# Patient Record
Sex: Male | Born: 1935 | Race: White | Hispanic: No | Smoking: Former smoker
Health system: Southern US, Community
[De-identification: ages and names within clinical notes are randomized; demographics above are authoritative.]

## PROBLEM LIST (undated history)

## (undated) DIAGNOSIS — C801 Malignant (primary) neoplasm, unspecified: Secondary | ICD-10-CM

## (undated) DIAGNOSIS — K219 Gastro-esophageal reflux disease without esophagitis: Secondary | ICD-10-CM

## (undated) DIAGNOSIS — Z960 Presence of urogenital implants: Secondary | ICD-10-CM

## (undated) DIAGNOSIS — E785 Hyperlipidemia, unspecified: Secondary | ICD-10-CM

## (undated) DIAGNOSIS — E46 Unspecified protein-calorie malnutrition: Secondary | ICD-10-CM

## (undated) DIAGNOSIS — R972 Elevated prostate specific antigen [PSA]: Secondary | ICD-10-CM

## (undated) DIAGNOSIS — C7800 Secondary malignant neoplasm of unspecified lung: Secondary | ICD-10-CM

## (undated) DIAGNOSIS — Z978 Presence of other specified devices: Secondary | ICD-10-CM

## (undated) DIAGNOSIS — E039 Hypothyroidism, unspecified: Secondary | ICD-10-CM

## (undated) DIAGNOSIS — C189 Malignant neoplasm of colon, unspecified: Secondary | ICD-10-CM

## (undated) DIAGNOSIS — S91009A Unspecified open wound, unspecified ankle, initial encounter: Secondary | ICD-10-CM

## (undated) DIAGNOSIS — J90 Pleural effusion, not elsewhere classified: Secondary | ICD-10-CM

## (undated) DIAGNOSIS — I10 Essential (primary) hypertension: Secondary | ICD-10-CM

## (undated) HISTORY — PX: VASECTOMY: SHX75

## (undated) HISTORY — PX: CHOLECYSTECTOMY: SHX55

## (undated) HISTORY — PX: THORACENTESIS: SHX235

## (undated) HISTORY — DX: Unspecified protein-calorie malnutrition: E46

## (undated) HISTORY — DX: Essential (primary) hypertension: I10

## (undated) HISTORY — DX: Hyperlipidemia, unspecified: E78.5

## (undated) HISTORY — DX: Hypothyroidism, unspecified: E03.9

## (undated) HISTORY — DX: Elevated prostate specific antigen (PSA): R97.20

## (undated) HISTORY — DX: Pleural effusion, not elsewhere classified: J90

---

## 2000-05-21 ENCOUNTER — Encounter: Payer: Self-pay | Admitting: Family Medicine

## 2000-05-21 LAB — CONVERTED CEMR LAB
PSA: 2 ng/mL
PSA: 2 ng/mL

## 2001-04-20 ENCOUNTER — Encounter: Payer: Self-pay | Admitting: Family Medicine

## 2001-04-20 LAB — CONVERTED CEMR LAB
PSA: 2.1 ng/mL
PSA: 2.1 ng/mL

## 2002-07-21 ENCOUNTER — Encounter: Payer: Self-pay | Admitting: Family Medicine

## 2002-07-21 LAB — CONVERTED CEMR LAB
PSA: 2.2 ng/mL
PSA: 2.2 ng/mL

## 2003-07-22 ENCOUNTER — Encounter: Payer: Self-pay | Admitting: Family Medicine

## 2003-07-22 LAB — CONVERTED CEMR LAB: PSA: 2.2 ng/mL

## 2004-07-21 ENCOUNTER — Encounter: Payer: Self-pay | Admitting: Family Medicine

## 2004-07-21 LAB — CONVERTED CEMR LAB
PSA: 2.3 ng/mL
PSA: 2.3 ng/mL

## 2005-02-10 ENCOUNTER — Ambulatory Visit: Payer: Self-pay | Admitting: Family Medicine

## 2005-02-12 ENCOUNTER — Ambulatory Visit: Payer: Self-pay | Admitting: Family Medicine

## 2005-02-25 ENCOUNTER — Ambulatory Visit: Payer: Self-pay | Admitting: Family Medicine

## 2005-07-21 ENCOUNTER — Encounter: Payer: Self-pay | Admitting: Family Medicine

## 2005-07-21 LAB — CONVERTED CEMR LAB
Hgb A1c MFr Bld: 5.6 %
PSA: 2.94 ng/mL
PSA: 2.94 ng/mL

## 2005-08-10 ENCOUNTER — Ambulatory Visit: Payer: Self-pay | Admitting: Family Medicine

## 2005-08-12 ENCOUNTER — Ambulatory Visit: Payer: Self-pay | Admitting: Family Medicine

## 2005-10-16 ENCOUNTER — Ambulatory Visit: Payer: Self-pay | Admitting: Internal Medicine

## 2006-01-21 ENCOUNTER — Encounter: Payer: Self-pay | Admitting: Family Medicine

## 2006-01-21 LAB — CONVERTED CEMR LAB: PSA: 3.01 ng/mL

## 2006-02-09 ENCOUNTER — Ambulatory Visit: Payer: Self-pay | Admitting: Family Medicine

## 2006-02-12 ENCOUNTER — Ambulatory Visit: Payer: Self-pay | Admitting: Family Medicine

## 2006-02-18 ENCOUNTER — Encounter: Payer: Self-pay | Admitting: Family Medicine

## 2006-02-18 LAB — CONVERTED CEMR LAB
PSA: 3.15 ng/mL
PSA: 3.15 ng/mL

## 2006-03-12 ENCOUNTER — Ambulatory Visit: Payer: Self-pay | Admitting: Family Medicine

## 2006-08-21 ENCOUNTER — Encounter: Payer: Self-pay | Admitting: Family Medicine

## 2006-09-07 ENCOUNTER — Ambulatory Visit: Payer: Self-pay | Admitting: Family Medicine

## 2006-09-16 ENCOUNTER — Ambulatory Visit: Payer: Self-pay | Admitting: Family Medicine

## 2006-09-29 ENCOUNTER — Ambulatory Visit: Payer: Self-pay | Admitting: Family Medicine

## 2006-10-22 ENCOUNTER — Ambulatory Visit: Payer: Self-pay | Admitting: Family Medicine

## 2007-04-29 ENCOUNTER — Encounter: Payer: Self-pay | Admitting: Family Medicine

## 2007-04-29 DIAGNOSIS — E785 Hyperlipidemia, unspecified: Secondary | ICD-10-CM | POA: Insufficient documentation

## 2007-04-29 DIAGNOSIS — R972 Elevated prostate specific antigen [PSA]: Secondary | ICD-10-CM

## 2007-04-29 DIAGNOSIS — I10 Essential (primary) hypertension: Secondary | ICD-10-CM

## 2007-04-29 DIAGNOSIS — E039 Hypothyroidism, unspecified: Secondary | ICD-10-CM

## 2007-05-01 DIAGNOSIS — R7989 Other specified abnormal findings of blood chemistry: Secondary | ICD-10-CM | POA: Insufficient documentation

## 2007-05-06 ENCOUNTER — Ambulatory Visit: Payer: Self-pay | Admitting: Family Medicine

## 2007-09-22 ENCOUNTER — Ambulatory Visit: Payer: Self-pay | Admitting: Family Medicine

## 2007-09-23 ENCOUNTER — Telehealth: Payer: Self-pay | Admitting: Family Medicine

## 2007-11-01 ENCOUNTER — Ambulatory Visit: Payer: Self-pay | Admitting: Family Medicine

## 2007-11-01 DIAGNOSIS — E749 Disorder of carbohydrate metabolism, unspecified: Secondary | ICD-10-CM | POA: Insufficient documentation

## 2007-11-01 LAB — CONVERTED CEMR LAB
ALT: 38 units/L (ref 0–53)
Albumin: 3.6 g/dL (ref 3.5–5.2)
Alkaline Phosphatase: 38 units/L — ABNORMAL LOW (ref 39–117)
BUN: 17 mg/dL (ref 6–23)
CO2: 29 meq/L (ref 19–32)
Calcium: 9.9 mg/dL (ref 8.4–10.5)
Direct LDL: 69.5 mg/dL
Free T4: 1.1 ng/dL (ref 0.6–1.6)
GFR calc Af Amer: 70 mL/min
HDL: 27.6 mg/dL — ABNORMAL LOW (ref >39.0)
Microalb Creat Ratio: 4.7 mg/g (ref 0.0–30.0)
Microalb, Ur: 0.9 mg/dL (ref 0.0–1.9)
PSA: 3.13 ng/mL (ref 0.10–4.00)
Potassium: 4.1 meq/L (ref 3.5–5.1)
TSH: 4.52 microintl units/mL (ref 0.35–5.50)
Total Protein: 7.3 g/dL (ref 6.0–8.3)
Triglycerides: 280 mg/dL (ref 0–149)
VLDL: 56 mg/dL — ABNORMAL HIGH (ref 0–40)

## 2007-11-08 ENCOUNTER — Ambulatory Visit: Payer: Self-pay | Admitting: Family Medicine

## 2007-11-25 ENCOUNTER — Ambulatory Visit: Payer: Self-pay | Admitting: Family Medicine

## 2007-11-28 ENCOUNTER — Encounter (INDEPENDENT_AMBULATORY_CARE_PROVIDER_SITE_OTHER): Payer: Self-pay | Admitting: *Deleted

## 2008-02-22 ENCOUNTER — Encounter: Payer: Self-pay | Admitting: Family Medicine

## 2008-06-26 ENCOUNTER — Ambulatory Visit: Payer: Self-pay | Admitting: Family Medicine

## 2008-09-19 ENCOUNTER — Ambulatory Visit: Payer: Self-pay | Admitting: Family Medicine

## 2008-11-22 ENCOUNTER — Encounter (INDEPENDENT_AMBULATORY_CARE_PROVIDER_SITE_OTHER): Payer: Self-pay | Admitting: *Deleted

## 2008-11-27 ENCOUNTER — Ambulatory Visit: Payer: Self-pay | Admitting: Family Medicine

## 2008-11-27 LAB — CONVERTED CEMR LAB
ALT: 42 units/L (ref 0–53)
Alkaline Phosphatase: 37 units/L — ABNORMAL LOW (ref 39–117)
Bilirubin, Direct: 0.1 mg/dL (ref 0.0–0.3)
CO2: 29 meq/L (ref 19–32)
Calcium: 9.4 mg/dL (ref 8.4–10.5)
Chloride: 103 meq/L (ref 96–112)
Glucose, Bld: 114 mg/dL — ABNORMAL HIGH (ref 70–99)
Microalb, Ur: 1.4 mg/dL (ref 0.0–1.9)
PSA: 3.41 ng/mL (ref 0.10–4.00)
Potassium: 4.2 meq/L (ref 3.5–5.1)
Sodium: 139 meq/L (ref 135–145)
TSH: 3.82 microintl units/mL (ref 0.35–5.50)
Total Bilirubin: 0.9 mg/dL (ref 0.3–1.2)
Total CHOL/HDL Ratio: 4.4
Total Protein: 7.6 g/dL (ref 6.0–8.3)

## 2008-11-29 ENCOUNTER — Ambulatory Visit: Payer: Self-pay | Admitting: Family Medicine

## 2008-12-28 ENCOUNTER — Ambulatory Visit: Payer: Self-pay | Admitting: Family Medicine

## 2008-12-28 LAB — CONVERTED CEMR LAB
OCCULT 1: NEGATIVE
OCCULT 3: NEGATIVE

## 2008-12-31 ENCOUNTER — Encounter (INDEPENDENT_AMBULATORY_CARE_PROVIDER_SITE_OTHER): Payer: Self-pay | Admitting: *Deleted

## 2009-04-04 ENCOUNTER — Telehealth: Payer: Self-pay | Admitting: Family Medicine

## 2009-06-20 ENCOUNTER — Ambulatory Visit: Payer: Self-pay | Admitting: Family Medicine

## 2009-06-21 ENCOUNTER — Encounter (INDEPENDENT_AMBULATORY_CARE_PROVIDER_SITE_OTHER): Payer: Self-pay | Admitting: *Deleted

## 2009-09-10 ENCOUNTER — Encounter: Payer: Self-pay | Admitting: Family Medicine

## 2009-09-13 ENCOUNTER — Telehealth (INDEPENDENT_AMBULATORY_CARE_PROVIDER_SITE_OTHER): Payer: Self-pay | Admitting: Internal Medicine

## 2009-09-18 ENCOUNTER — Telehealth: Payer: Self-pay | Admitting: Family Medicine

## 2009-12-24 ENCOUNTER — Ambulatory Visit: Payer: Self-pay | Admitting: Family Medicine

## 2009-12-24 LAB — CONVERTED CEMR LAB
ALT: 35 units/L (ref 0–53)
BUN: 18 mg/dL (ref 6–23)
CO2: 29 meq/L (ref 19–32)
Calcium: 9.9 mg/dL (ref 8.4–10.5)
Chloride: 101 meq/L (ref 96–112)
Cholesterol: 135 mg/dL (ref 0–200)
Creatinine, Ser: 1.4 mg/dL (ref 0.4–1.5)
HDL: 28.3 mg/dL — ABNORMAL LOW (ref >39.00)
TSH: 4.28 microintl units/mL (ref 0.35–5.50)
Total Bilirubin: 0.9 mg/dL (ref 0.3–1.2)
Total Protein: 7.7 g/dL (ref 6.0–8.3)
Triglycerides: 483 mg/dL — ABNORMAL HIGH (ref 0.0–149.0)

## 2009-12-26 ENCOUNTER — Ambulatory Visit: Payer: Self-pay | Admitting: Family Medicine

## 2010-01-28 ENCOUNTER — Ambulatory Visit: Payer: Self-pay | Admitting: Family Medicine

## 2010-01-28 ENCOUNTER — Encounter (INDEPENDENT_AMBULATORY_CARE_PROVIDER_SITE_OTHER): Payer: Self-pay | Admitting: *Deleted

## 2010-01-28 LAB — FECAL OCCULT BLOOD, GUAIAC: Fecal Occult Blood: NEGATIVE

## 2010-01-28 LAB — CONVERTED CEMR LAB
OCCULT 2: NEGATIVE
OCCULT 3: NEGATIVE

## 2010-02-21 ENCOUNTER — Ambulatory Visit: Payer: Self-pay | Admitting: Family Medicine

## 2010-02-22 LAB — CONVERTED CEMR LAB
PSA, Free Pct: 36 (ref >25)
PSA, Free: 1.2 ng/mL
PSA: 3.35 ng/mL (ref 0.10–4.00)

## 2010-02-26 ENCOUNTER — Ambulatory Visit: Payer: Self-pay | Admitting: Family Medicine

## 2010-04-22 ENCOUNTER — Telehealth: Payer: Self-pay | Admitting: Family Medicine

## 2010-05-21 ENCOUNTER — Ambulatory Visit: Payer: Self-pay | Admitting: Family Medicine

## 2010-06-06 ENCOUNTER — Telehealth: Payer: Self-pay | Admitting: Family Medicine

## 2010-06-17 ENCOUNTER — Encounter: Payer: Self-pay | Admitting: Family Medicine

## 2010-06-18 ENCOUNTER — Encounter: Payer: Self-pay | Admitting: Family Medicine

## 2010-06-24 ENCOUNTER — Ambulatory Visit: Payer: Self-pay | Admitting: Family Medicine

## 2010-07-29 ENCOUNTER — Encounter (INDEPENDENT_AMBULATORY_CARE_PROVIDER_SITE_OTHER): Payer: Self-pay | Admitting: *Deleted

## 2010-10-09 ENCOUNTER — Encounter: Payer: Self-pay | Admitting: Family Medicine

## 2011-01-02 ENCOUNTER — Telehealth (INDEPENDENT_AMBULATORY_CARE_PROVIDER_SITE_OTHER): Payer: Self-pay | Admitting: *Deleted

## 2011-01-05 ENCOUNTER — Other Ambulatory Visit: Payer: Self-pay | Admitting: Family Medicine

## 2011-01-05 ENCOUNTER — Ambulatory Visit
Admission: RE | Admit: 2011-01-05 | Discharge: 2011-01-05 | Payer: Self-pay | Source: Home / Self Care | Attending: Family Medicine | Admitting: Family Medicine

## 2011-01-05 LAB — TSH: TSH: 5.51 u[IU]/mL — ABNORMAL HIGH (ref 0.35–5.50)

## 2011-01-05 LAB — LIPID PANEL
Cholesterol: 135 mg/dL (ref 0–200)
HDL: 28.8 mg/dL — ABNORMAL LOW (ref >39.00)
Total CHOL/HDL Ratio: 5
Triglycerides: 245 mg/dL — ABNORMAL HIGH (ref 0.0–149.0)
VLDL: 49 mg/dL — ABNORMAL HIGH (ref 0.0–40.0)

## 2011-01-05 LAB — BASIC METABOLIC PANEL
BUN: 21 mg/dL (ref 6–23)
CO2: 27 mEq/L (ref 19–32)
Calcium: 9.4 mg/dL (ref 8.4–10.5)
Chloride: 100 mEq/L (ref 96–112)
Creatinine, Ser: 1.4 mg/dL (ref 0.4–1.5)
GFR: 53.61 mL/min — ABNORMAL LOW (ref >60.00)
Glucose, Bld: 99 mg/dL (ref 70–99)
Potassium: 3.8 mEq/L (ref 3.5–5.1)
Sodium: 137 mEq/L (ref 135–145)

## 2011-01-05 LAB — LDL CHOLESTEROL, DIRECT: Direct LDL: 63.4 mg/dL

## 2011-01-05 LAB — PSA: PSA: 3.87 ng/mL (ref 0.10–4.00)

## 2011-01-05 LAB — HEPATIC FUNCTION PANEL
ALT: 19 U/L (ref 0–53)
AST: 18 U/L (ref 0–37)
Albumin: 3.6 g/dL (ref 3.5–5.2)
Alkaline Phosphatase: 40 U/L (ref 39–117)
Bilirubin, Direct: 0.1 mg/dL (ref 0.0–0.3)
Total Bilirubin: 1.1 mg/dL (ref 0.3–1.2)
Total Protein: 7 g/dL (ref 6.0–8.3)

## 2011-01-12 ENCOUNTER — Ambulatory Visit
Admission: RE | Admit: 2011-01-12 | Discharge: 2011-01-12 | Payer: Self-pay | Source: Home / Self Care | Attending: Family Medicine | Admitting: Family Medicine

## 2011-01-12 ENCOUNTER — Encounter: Payer: Self-pay | Admitting: Family Medicine

## 2011-01-20 ENCOUNTER — Other Ambulatory Visit: Payer: Self-pay | Admitting: Family Medicine

## 2011-01-20 ENCOUNTER — Ambulatory Visit
Admission: RE | Admit: 2011-01-20 | Discharge: 2011-01-20 | Payer: Self-pay | Source: Home / Self Care | Attending: Family Medicine | Admitting: Family Medicine

## 2011-01-20 LAB — FECAL OCCULT BLOOD, IMMUNOCHEMICAL: Fecal Occult Bld: NEGATIVE

## 2011-01-20 NOTE — Progress Notes (Signed)
Summary: needs to change from maxzide  Phone Note Call from Patient Call back at Home Phone [phone]   Caller: Patient Summary of Call: Pt is unable to get maxzide through [facility].  He is asking to be changed to something else.  Letter is on your shelf. Initial call taken by: Lowella Petties CMA,  June 06, 2010 11:52 AM  Follow-up for Phone Call        Go to plain HCTZ 25mg  but get BP checked in 3 weeks. Follow-up by: Shaune Leeks MD,  June 09, 2010 7:30 AM  Additional Follow-up for Phone Call Additional follow up Details #1::        Pt to pick up script with instructions. Additional Follow-up by: Lowella Petties CMA,  June 09, 2010 10:12 AM    New/Updated Medications: HYDROCHLOROTHIAZIDE 25 MG TABS (HYDROCHLOROTHIAZIDE) one tab by mouth in AM Prescriptions: HYDROCHLOROTHIAZIDE 25 MG TABS (HYDROCHLOROTHIAZIDE) one tab by mouth in AM  #90 x 3   Entered and Authorized by:   Shaune Leeks MD   Signed by:   Shaune Leeks MD on 06/09/2010   Method used:   Print then Give to Patient   RxID:   [id number]

## 2011-01-20 NOTE — Assessment & Plan Note (Signed)
Summary: RECHECK OF B/P PER DR. SCHALLER   Vital Signs:  Patient profile:   76 year old male Height:      67 inches Weight:      213.25 pounds BMI:     33.52 Temp:     97.9 degrees F oral Pulse rate:   88 / minute Pulse rhythm:   regular BP sitting:   102 / 60  (left arm) Cuff size:   large  Vitals Entered By: Delilah Shan CMA (AAMA) (June 24, 2010 9:11 AM) CC: Recheck BP   History of Present Illness: Hypertension:      Using medication without problems or lightheadedness: yes Chest pain with exertion: no Edema:no Short of breath:no Average home BPs: not checked recently.   Other issues: no problems with med change.  here for BP check.   Allergies: No Known Drug Allergies  Review of Systems       See HPI.  Otherwise noncontributory.    Physical Exam  General:  GEN: nad, alert and oriented HEENT: mucous membranes moist NECK: supple, no bruit CV: regular rate and rhythm  PULM: ctab, no inc wob ABD: soft, +bs EXT: no edema SKIN: no acute rash    Impression & Recommendations:  Problem # 1:  HYPERTENSION (ICD-401.9) Continue current meds.  No change.  BP controlled.  His updated medication list for this problem includes:    Cardura 8 Mg Tabs (Doxazosin mesylate) .[name]... 1 by mouth daily    Propranolol Hcl 40 Mg Tabs (Propranolol hcl) .[name]... 1 by mouth two times a day    Hydrochlorothiazide 25 Mg Tabs (Hydrochlorothiazide) ..... One tab by mouth in am    Verapamil Hcl Cr 240 Mg Cr-tabs (Verapamil hcl) ..... One tab by mouth daily  Complete Medication List: 1)  Cardura 8 Mg Tabs (Doxazosin mesylate) .[name].. 1 by mouth daily 2)  Vitamin C Cr 500 Mg Cpcr (Ascorbic acid) .... As needed winter 3)  Propranolol Hcl 40 Mg Tabs (Propranolol hcl) .[name].. 1 by mouth two times a day 4)  Lipitor 10 Mg Tabs (Atorvastatin calcium) .[name].. 1 by mouth at bedtime 5)  Levoxyl 50 Mcg Tabs (Levothyroxine sodium) .[name].. 1 by mouth daily 6)  Hydrochlorothiazide 25 Mg Tabs (Hydrochlorothiazide)  .... One tab by mouth in am 7)  Fish Oil 1000 Mg Caps (Omega-3 fatty acids) .[name].. 1 by mouth two times a day 8)  B-100 Tabs (Vitamins-lipotropics) .[name].. 1 by mouth two times a day 9)  Verapamil Hcl Cr 240 Mg Cr-tabs (Verapamil hcl) .... One tab by mouth daily  Patient Instructions: 1)  Please schedule a follow-up appointment for October for a physical.  Keep taking your meds as you have been doing.  2)  Sunnie Nielsen, MD    Orders Added: 1)  Est. Patient Level III [phone]   Current Allergies (reviewed today): No known allergies

## 2011-01-20 NOTE — Progress Notes (Signed)
Summary: refill request for verapamil, propranolol  Phone Note Refill Request Message from:  Fax from Pharmacy  Refills Requested: Medication #1:  VERELAN 240 MG CP24 1 by mouth daily  Medication #2:  PROPRANOLOL HCL 40 MG TABS 1 by mouth two times a day Forms from [facility] is on  your shelf.  Initial call taken by: Lowella Petties CMA,  Apr 22, 2010 8:26 AM  Follow-up for Phone Call        Forms and scripts faxed. Follow-up by: Lowella Petties CMA,  Apr 22, 2010 8:59 AM    Prescriptions: VERELAN 240 MG CP24 (VERAPAMIL HCL) 1 by mouth daily  #90 x 3   Entered and Authorized by:   Shaune Leeks MD   Signed by:   Shaune Leeks MD on 04/22/2010   Method used:   Printed then faxed to ...         RxID:   [id number] PROPRANOLOL HCL 40 MG TABS (PROPRANOLOL HCL) 1 by mouth two times a day  #90 x 3   Entered and Authorized by:   Shaune Leeks MD   Signed by:   Shaune Leeks MD on 04/22/2010   Method used:   Printed then faxed to ...         RxID:   [id number]

## 2011-01-20 NOTE — Assessment & Plan Note (Signed)
Summary: cpx /rbh   Vital Signs:  Patient profile:   76 year old male Weight:      210 pounds Temp:     97.8 degrees F oral Pulse rate:   60 / minute Pulse rhythm:   regular BP sitting:   102 / 64  (left arm) Cuff size:   large  Vitals Entered By: Sydell Axon LPN 01/07/2010 8:20 AM) CC: 30 Minute checkup, hemoccult cards given to patient   History of Present Illness: Pt here for followup, having no complaints, had good Holiday Season. He feels well.  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 1 By Mouth Daily 2)  Vitamin C Cr 500 Mg Cpcr (Ascorbic Acid) .... As Needed Winter 3)  Propranolol Hcl 40 Mg Tabs (Propranolol Hcl) .[name].. 1 By Mouth Two Times A Day 4)  Lipitor 10 Mg Tabs (Atorvastatin Calcium) .[name].. 1 By Mouth At Bedtime 5)  Levoxyl 50 Mcg Tabs (Levothyroxine Sodium) .[name].. 1 By Mouth Daily 6)  Verelan 240 Mg Cp24 (Verapamil Hcl) .[name].. 1 By Mouth Daily 7)  Triamterene-Hctz 37.5-25 Mg Tabs (Triamterene-Hctz) .[name].. 1 By Mouth Daily 8)  Fish Oil 1000 Mg Caps (Omega-3 Fatty Acids) .[name].. 1 By Mouth Two Times A Day 9)  B-100  Tabs (Vitamins-Lipotropics) .[name].. 1 By Mouth Two Times A Day  Allergies: No  Known Drug Allergies  Past History:  Past Surgical History: Last updated: 04/29/2007 Choleycystectomy  Early 90s  Family History: Last updated: 01-07-2010 Father: died 6 ? MI Mother: died 28 HTN, high chol, [place] (In rest home) Brother A 40 Fayrene Fearing) Sister A 46  Breast Mass Sister A 55   (+) CAD (?MI father) HBP:  (+) Mother DM:  (+) cousin (-) stroke  Social History: Last updated: 01/07/10 Marital Status: Divorced, lives alone Children:  Occupation: Retired Camera operator Retired totally from part-time [employer] Former Smoker, quit 1987 2 PPD x 30 - 60 PYH Alcohol use-yes, rarely Drug use-no  Risk Factors: Alcohol Use: <1 (01-07-2010) Caffeine Use: 0 (2010-01-07) Exercise: yes (2010/01/07)  Risk Factors: Smoking Status: quit (01/07/10) Passive Smoke Exposure: no (2010/01/07)  Family History: Father: died 19 ? MI Mother: died 10 HTN, high chol, [place] (In rest home) Brother A 5 Fayrene Fearing) Sister A 77  Breast Mass Sister A 7   (+) CAD (?MI father) HBP:  (+) Mother DM:  (+) cousin (-) stroke  Social History: Marital Status: Divorced, lives alone Children:  Occupation: Retired Camera operator Retired totally from part-time [employer] Former Smoker, quit 1987 2 PPD x 30 - 60 PYH Alcohol use-yes, rarely Drug use-no Caffeine use/day:  0  Review of Systems General:  Denies chills, fatigue, fever, sweats, weakness, and weight loss. Eyes:  Denies blurring, eye irritation, eye pain, and itching. ENT:  Complains of decreased hearing; denies ear discharge, earache, and ringing in ears; just got hearing aids. CV:  Denies chest pain or discomfort, fainting, fatigue, palpitations, and shortness of breath with exertion. Resp:  Denies cough, shortness of breath, and wheezing. GI:  Denies abdominal pain, bloody stools, change in bowel habits, constipation, dark tarry stools, diarrhea, indigestion, loss of appetite, nausea, vomiting, vomiting blood, and  yellowish skin color. GU:  Denies discharge, dysuria, incontinence, nocturia, and urinary frequency. MS:  Denies joint pain, joint swelling, low back pain, muscle aches, cramps, and stiffness. Derm:  Complains of dryness; denies itching and rash. Neuro:  Denies numbness, poor balance, tingling, and tremors.  Physical Exam  General:  Well-developed,well-nourished,in no acute distress; alert,appropriate and cooperative throughout examination, mildly obese. Head:  Normocephalic and atraumatic without obvious abnormalities. No apparent alopecia or balding. Sinuses NT. Eyes:  Conjunctiva clear bilaterally.  Ears:  External ear exam shows no significant lesions or deformities.  Otoscopic examination reveals clear canals, tympanic membranes are intact bilaterally without bulging, retraction, inflammation or discharge. Hearing is grossly normal bilaterally. Nose:  External nasal examination shows no deformity or inflammation. Nasal mucosa are pink and moist without lesions or exudates. Mouth:  Oral mucosa and oropharynx without lesions or exudates.  Teeth in good repair. Neck:  No deformities, masses, or tenderness noted. Chest Wall:  No deformities, masses, tenderness or gynecomastia noted. Breasts:  No masses or gynecomastia noted Lungs:  Normal respiratory effort, chest expands symmetrically. Lungs are clear to auscultation, no crackles or wheezes. Heart:  Normal rate and regular rhythm. S1 and S2 normal without gallop, murmur, click, rub or other extra sounds. Abdomen:  Bowel sounds positive,abdomen soft and non-tender without masses, organomegaly or hernias noted. Rectal:  No external abnormalities noted. Normal sphincter tone. No rectal masses or tenderness. G neg. Genitalia:  Testes bilaterally descended without nodularity, tenderness or masses. No scrotal masses or lesions. No penis lesions or urethral discharge. Prostate:  Prostate gland firm and smooth, no enlargement, nodularity,  tenderness, mass, asymmetry or induration. 30gms. Msk:  No deformity or scoliosis noted of thoracic or lumbar spine.   Pulses:  R and L carotid,radial,femoral,dorsalis pedis and posterior tibial pulses are full and equal bilaterally Extremities:  No clubbing, cyanosis, edema, or deformity noted with normal full range of motion of all joints.   Neurologic:  No cranial nerve deficits noted. Station and gait are normal. Plantar reflexes are down-going bilaterally. DTRs are symmetrical throughout. Sensory, motor and coordinative functions appear intact. Skin:  Intact without suspicious lesions or rashes Cervical Nodes:  No lymphadenopathy noted Inguinal Nodes:  No significant adenopathy Psych:  Cognition and judgment appear intact. Alert and cooperative with normal attention span and concentration. No apparent delusions, illusions, hallucinations   Impression & Recommendations:  Problem # 1:  SPECIAL SCREENING MALIGNANT NEOPLASM OF PROSTATE (ICD-V76.44) Assessment Deteriorated Continues to slowly increase, will recheck next time with Free PSA...two months. Exam is smooth and symmetric, slighltly enlarged, no focal areas of concern.  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 Discussed getting back to regular exercise and watching diet.  Problem # 3:  HYPOTHYROIDISM (ICD-244.9) Assessment: Unchanged Euthyroid on current dose. Cont. His updated medication list for this problem includes:    Levoxyl 50 Mcg Tabs (Levothyroxine sodium) .[name]... 1 by mouth daily  Labs Reviewed: TSH: 4.28 (12/24/2009)    HgBA1c: 5.6 (07/21/2005) Chol: 135 (12/24/2009)   HDL: 28.30 (12/24/2009)   LDL: 63 (11/27/2008)   TG: 483.0 (12/24/2009)  Problem # 4:  HYPERLIPIDEMIA (ICD-272.4) Assessment: Unchanged  LDL great.Marland KitchenMarland KitchenMarland KitchenTrigs too high so decrease Sweets and carbs....needs to do this for Glu anyway. KLnow it is diff but was encouraged. HDL low so  getting back to exercise would be paramount...discussed. He has done before. His updated medication list for this problem includes:    Lipitor 10 Mg Tabs (Atorvastatin calcium) .[name]... 1 by mouth at bedtime  Labs Reviewed: SGOT: 31 (12/24/2009)   SGPT: 35 (12/24/2009)   HDL:28.30 (12/24/2009), 29.7 (11/27/2008)  LDL:63 (11/27/2008), DEL (47/42/5956)  Chol:135 (12/24/2009), 130 (11/27/2008)  Trig:483.0 (12/24/2009), 189 (11/27/2008)  Problem # 5:  HYPERTENSION (ICD-401.9) Assessment: Unchanged Stable. His updated medication list for this problem includes:    Cardura 8 Mg Tabs (Doxazosin mesylate) .[name]... 1 by mouth daily    Propranolol Hcl 40 Mg Tabs (Propranolol hcl) .[name]... 1 by mouth two times a day    Verelan 240 Mg Cp24 (Verapamil hcl) .[name]... 1 by mouth daily    Triamterene-hctz 37.5-25 Mg Tabs (Triamterene-hctz) .[name]... 1 by mouth daily  BP today: 102/64 Prior BP: 120/80 (06/20/2009)  Labs Reviewed: K+: 3.8 (12/24/2009) Creat: : 1.4 (12/24/2009)   Chol: 135 (12/24/2009)   HDL: 28.30 (12/24/2009)   LDL: 63 (11/27/2008)   TG: 483.0 (12/24/2009)  Complete Medication List: 1)  Cardura 8 Mg Tabs (Doxazosin mesylate) .[name].. 1 by mouth daily 2)  Vitamin C Cr 500 Mg Cpcr (Ascorbic acid) .... As needed winter 3)  Propranolol Hcl 40 Mg Tabs (Propranolol hcl) .[name].. 1 by mouth two times a day 4)  Lipitor 10 Mg Tabs (Atorvastatin calcium) .[name].. 1 by mouth at bedtime 5)  Levoxyl 50 Mcg Tabs (Levothyroxine sodium) .[name].. 1 by mouth daily 6)  Verelan 240 Mg Cp24 (Verapamil hcl) .[name].. 1 by mouth daily 7)  Triamterene-hctz 37.5-25 Mg Tabs (Triamterene-hctz) .[name].. 1 by mouth daily 8)  Fish Oil 1000 Mg Caps (Omega-3 fatty acids) .[name].. 1 by mouth two times a day 9)  B-100 Tabs (Vitamins-lipotropics) .[name].. 1 by mouth two times a day  Patient Instructions: 1)  RTC 2 mos for F/U, psa  and free psa prior 790.93  Current Allergies (reviewed today): No known allergies

## 2011-01-20 NOTE — Letter (Signed)
Summary: [facility] for Back Support  [facility]   Imported By: Beau Fanny 06/18/2010 10:49:48  _____________________________________________________________________  External Attachment:    Type:   Image     Comment:   External Document

## 2011-01-20 NOTE — Miscellaneous (Signed)
Summary: Flu vaccine  Clinical Lists Changes  Observations: Added new observation of FLU VAX: Historical (10/01/2010 11:15)      Influenza Immunization History:    Influenza # 1:  Historical (10/01/2010) Received form from [facility] [address] [place], [place]

## 2011-01-20 NOTE — Assessment & Plan Note (Signed)
Summary: FOLLOW UP   Vital Signs:  Patient profile:   76 year old male Weight:      209 pounds Temp:     98.5 degrees F oral Pulse rate:   64 / minute Pulse rhythm:   regular BP sitting:   114 / 68  (left arm) Cuff size:   large  Vitals Entered By: Sydell Axon LPN (February 26, 1609 9:00 AM) CC: Follow-up after labs   History of Present Illness: Hunter Hardin is a 76 y/o male who presents today for a f/u PSA following prior elevated PSA level.  His labs showed a PSA of 3.35.  Patient is retired but stays busy doing house remodeling.  He is tolerating all of his medications well and has no other medical complaints today.  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 1 By Mouth Daily 2)  Vitamin C Cr 500 Mg Cpcr (Ascorbic Acid) .... As Needed Winter 3)  Propranolol Hcl 40 Mg Tabs (Propranolol Hcl) .[name].. 1 By Mouth Two Times A Day 4)  Lipitor 10 Mg Tabs (Atorvastatin Calcium) .[name].. 1 By Mouth At Bedtime 5)  Levoxyl 50 Mcg Tabs (Levothyroxine Sodium) .[name].. 1 By Mouth Daily 6)  Verelan 240 Mg Cp24 (Verapamil Hcl) .[name].. 1 By Mouth Daily 7)  Triamterene-Hctz 37.5-25 Mg Tabs (Triamterene-Hctz) .[name].. 1 By Mouth Daily 8)  Fish Oil 1000 Mg Caps (Omega-3 Fatty Acids) .[name].. 1 By Mouth Two Times A Day 9)  B-100  Tabs (Vitamins-Lipotropics) .[name].. 1 By Mouth Two Times A Day  Allergies: No Known Drug Allergies  Physical Exam  General:  Well-developed,well-nourished,in no acute distress; alert,appropriate and cooperative throughout examination Head:  Normocephalic and  atraumatic without obvious abnormalities. No apparent alopecia or balding. Sinuses NT. Eyes:  Conjunctiva clear bilaterally.  Ears:  External ear exam shows no significant lesions or deformities.  Otoscopic examination reveals clear canals, tympanic membranes are intact bilaterally without bulging, retraction, inflammation or discharge. Hearing is grossly normal bilaterally. Nose:  External nasal examination shows no deformity or inflammation. Nasal mucosa are pink and moist without lesions or exudates. Mouth:  Oral mucosa and oropharynx without lesions or exudates.  Teeth in good repair. Lungs:  Normal respiratory effort, chest expands symmetrically. Lungs are clear to auscultation, no crackles or wheezes. Heart:  Normal rate and regular rhythm. S1 and S2 normal without gallop, murmur, click, rub or other extra sounds.   Impression & Recommendations:  Problem # 1:  ELEVATED PROSTATE SPECIFIC ANTIGEN (ICD-790.93) Assessment Improved Has decreased. Will follow.   Complete Medication List: 1)  Cardura 8 Mg Tabs (Doxazosin mesylate) .[name].. 1 by mouth daily 2)  Vitamin C Cr 500 Mg Cpcr (Ascorbic acid) .... As needed winter 3)  Propranolol Hcl 40 Mg Tabs (Propranolol hcl) .[name].. 1 by mouth two times a day 4)  Lipitor 10 Mg Tabs (Atorvastatin calcium) .[name].. 1 by mouth at bedtime 5)  Levoxyl 50 Mcg Tabs (Levothyroxine sodium) .[name].. 1 by mouth daily 6)  Verelan 240 Mg Cp24 (Verapamil hcl) .[name].. 1 by mouth daily 7)  Triamterene-hctz 37.5-25 Mg Tabs (Triamterene-hctz) .[name].. 1 by mouth daily  8)  Fish Oil 1000 Mg Caps (Omega-3 fatty acids) .[name].. 1 by mouth two times a day 9)  B-100 Tabs (Vitamins-lipotropics) .[name].. 1 by mouth two times a day  Patient Instructions: 1)  RTC 12/2010 for Comp Exam. Labs prior.  Current Allergies (reviewed today): No known allergies

## 2011-01-20 NOTE — Letter (Signed)
Summary: [name] letter  [place] [facility]  [address] [place], [place] [address]   Phone: [phone]  Fax: [phone]       07/29/2010 MRN: [id number]  Hunter Hardin [address] [place], [place]  [address]  Dear Mr. [name],  [facility], and [facility] announce the retirement of Arta Silence, M.D., from full-time practice at the [facility] office effective June 19, 2010 and his plans of returning part-time.  It is important to Dr. Hetty Ely and to our practice that you understand that [facility] has seven physicians in our office for your health care needs.  We will continue to offer the same exceptional care that you have today.    Dr. Hetty Ely has spoken to many of you about his plans for retirement and returning part-time in the fall.   We will continue to work with you through the transition to schedule appointments for you in the office and meet the high standards that [facility] is committed to.   Again, it is with great pleasure that we share the news that Dr. Hetty Ely will return to [facility] at [facility] in October of 2011 with a reduced schedule.    If you have any questions, or would like to request an appointment with one of our physicians, please call us at [phone] and press the option for Scheduling an appointment.  We take pleasure in providing you with excellent patient care and look forward to seeing you at your next office visit.  Our [facility] Physicians are:  Hunter Hardin, M.D. Hunter Hardin, M.D. Hunter Hardin, M.D. Hunter Hardin, M.D. Hunter Hardin, M.D. Hunter Hardin, M.D. We proudly welcomed Hunter Hardin, M.D. and Hunter Hardin, M.D. to the practice in July/August 2011.  Sincerely,  [facility]

## 2011-01-20 NOTE — Medication Information (Signed)
Summary: Verapamil ER Approved  Verapamil ER Approved   Imported By: Maryln Gottron 09/19/2010 11:30:40  _____________________________________________________________________  External Attachment:    Type:   Image     Comment:   External Document

## 2011-01-20 NOTE — Letter (Signed)
Summary: Results Follow up Letter  [place] [facility]  [address] [place], [place] [address]   Phone: [phone]  Fax: [phone]    01/28/2010 MRN: [id number]  Hunter Hardin [address] [place], [place]  [address]  Dear Hunter Hardin,  The following are the results of your recent test(s):  Test         Result    Pap Smear:        Normal _____  Not Normal _____ Comments: ______________________________________________________ Cholesterol: LDL(Bad cholesterol):         Your goal is less than:         HDL (Good cholesterol):       Your goal is more than: Comments:  ______________________________________________________ Mammogram:        Normal _____  Not Normal _____ Comments:  ___________________________________________________________________ Hemoccult:        Normal __X___  Not normal _______ Comments:  Please repeat in one year.  _____________________________________________________________________ Other Tests:    We routinely do not discuss normal results over the telephone.  If you desire a copy of the results, or you have any questions about this information we can discuss them at your next office visit.   Sincerely,      Laurita Quint, MD

## 2011-01-20 NOTE — Assessment & Plan Note (Signed)
Summary: BACK PAIN,DISCUSS ORDER FOR BACK SUPPORT   Vital Signs:  Patient profile:   76 year old male Weight:      211.75 pounds BMI:     33.28 Temp:     97.9 degrees F oral Pulse rate:   68 / minute Pulse rhythm:   regular BP sitting:   120 / 62  (left arm) Cuff size:   large  Vitals Entered By: Sydell Axon LPN (May 21, 453 10:09 AM) CC: Back pain, needs a form completed for a back support, wants refills on meds   History of Present Illness: Pt here for aching across his beltline with prolonged weed eating. He had back problem in the [employer] and reqiured trmt. He really has not had a significant problem since except for prolonged bending,,,,weed eating being a significant stressor due to position and vibration. He also needs an alternative for his Verelan as it is not currently available.  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 1 By Mouth Daily 2)  Vitamin C Cr 500 Mg Cpcr (Ascorbic Acid) .... As Needed Winter 3)  Propranolol Hcl 40 Mg Tabs (Propranolol Hcl) .[name].. 1 By Mouth Two Times A Day 4)  Lipitor 10 Mg Tabs (Atorvastatin Calcium) .[name].. 1 By Mouth At Bedtime 5)  Levoxyl 50 Mcg Tabs (Levothyroxine Sodium) .[name].. 1 By Mouth Daily 6)  Verelan 240 Mg Cp24 (Verapamil Hcl) .[name].. 1 By Mouth Daily 7)  Triamterene-Hctz 37.5-25 Mg Tabs (Triamterene-Hctz) .[name].. 1 By Mouth Daily 8)  Fish Oil 1000 Mg Caps (Omega-3 Fatty Acids) .[name].. 1 By Mouth Two Times A Day 9)  B-100  Tabs (Vitamins-Lipotropics) .[name].. 1 By Mouth Two Times A Day  Allergies: No Known Drug  Allergies  Physical Exam  General:  Well-developed,well-nourished,in no acute distress; alert,appropriate and cooperative throughout examination Head:  Normocephalic and atraumatic without obvious abnormalities. No apparent alopecia or balding. Sinuses NT. Eyes:  Conjunctiva clear bilaterally.  Ears:  External ear exam shows no significant lesions or deformities.  Otoscopic examination reveals clear canals, tympanic membranes are intact bilaterally without bulging, retraction, inflammation or discharge. Hearing is grossly normal bilaterally. Nose:  External nasal examination shows no deformity or inflammation. Nasal mucosa are pink and moist without lesions or exudates. Mouth:  Oral mucosa and oropharynx without lesions or exudates.  Teeth in good repair. Lungs:  Normal respiratory effort, chest expands symmetrically. Lungs are clear to auscultation, no crackles or wheezes. Heart:  Normal rate and regular rhythm. S1 and S2 normal without gallop, murmur, click, rub or other extra sounds. Msk:  No deformity or scoliosis noted of thoracic or lumbar spine.  Low back reasonable ROM.   Impression & Recommendations:  Problem # 1:  BACK PAIN, LUMBAR (ICD-724.2) Assessment New Pian with prolonged weed eating or other prolonged provocative maneuvers....discussed my biasis of generally weakening the mmuscles with brace use. Suggest conservative measures we discussed.  Problem # 2:  HYPERTENSION (ICD-401.9) Assessment: Unchanged Stable. Change Verelan to Verapamil Cr. The following medications were removed from the medication list:    Verelan 240 Mg Cp24 (Verapamil hcl) .[name]... 1 by mouth  daily His updated medication list for this problem includes:    Cardura 8 Mg Tabs (Doxazosin mesylate) .[name]... 1 by mouth daily    Propranolol Hcl 40 Mg Tabs (Propranolol hcl) .[name]... 1 by mouth two times a day    Triamterene-hctz 37.5-25 Mg Tabs (Triamterene-hctz) .[name]... 1 by mouth daily    Verapamil Hcl Cr 240 Mg  Cr-tabs (Verapamil hcl) ..... One tab by mouth daily  BP today: 120/62 Prior BP: 114/68 (02/26/2010)  Labs Reviewed: K+: 3.8 (12/24/2009) Creat: : 1.4 (12/24/2009)   Chol: 135 (12/24/2009)   HDL: 28.30 (12/24/2009)   LDL: 63 (11/27/2008)   TG: 483.0 (12/24/2009)  Complete Medication List: 1)  Cardura 8 Mg Tabs (Doxazosin mesylate) .[name].. 1 by mouth daily 2)  Vitamin C Cr 500 Mg Cpcr (Ascorbic acid) .... As needed winter 3)  Propranolol Hcl 40 Mg Tabs (Propranolol hcl) .[name].. 1 by mouth two times a day 4)  Lipitor 10 Mg Tabs (Atorvastatin calcium) .[name].. 1 by mouth at bedtime 5)  Levoxyl 50 Mcg Tabs (Levothyroxine sodium) .[name].. 1 by mouth daily 6)  Triamterene-hctz 37.5-25 Mg Tabs (Triamterene-hctz) .[name].. 1 by mouth daily 7)  Fish Oil 1000 Mg Caps (Omega-3 fatty acids) .[name].. 1 by mouth two times a day 8)  B-100 Tabs (Vitamins-lipotropics) .[name].. 1 by mouth two times a day 9)  Verapamil Hcl Cr 240 Mg Cr-tabs (Verapamil hcl) .... One tab by mouth daily  Patient Instructions: 1)  RTC as discussed for BP recheck and Comp Exam. Prescriptions: VERAPAMIL HCL CR 240 MG CR-TABS (VERAPAMIL HCL) one tab by mouth daily  #90 x 3   Entered and Authorized by:   Shaune Leeks MD   Signed by:   Shaune Leeks MD on 05/21/2010   Method used:   Print then Give to Patient   RxID:   [id number] CARDURA 8 MG TABS (DOXAZOSIN MESYLATE) 1 by mouth daily  #90 x 3   Entered by:   Sydell Axon LPN   Authorized by:   Shaune Leeks MD   Signed by:   Sydell Axon LPN on 96/29/5284   Method used:   Print then Give to Patient   RxID:   [id number] TRIAMTERENE-HCTZ 37.5-25 MG TABS (TRIAMTERENE-HCTZ) 1 by mouth daily  #90 x 3   Entered by:   Sydell Axon LPN   Authorized by:   Shaune Leeks MD   Signed by:   Sydell Axon LPN on 40/34/7425   Method used:   Print then Give to Patient   RxID:   [id number]   Current Allergies (reviewed today): No known allergies

## 2011-01-22 ENCOUNTER — Encounter (INDEPENDENT_AMBULATORY_CARE_PROVIDER_SITE_OTHER): Payer: Self-pay | Admitting: *Deleted

## 2011-01-22 NOTE — Progress Notes (Signed)
----   Converted from flag ---- ---- 01/01/2011 11:06 PM, Crawford Givens MD wrote: 244.9 TSH 401.1 CMET/lipid 790.93 PSA  ---- 01/01/2011 10:48 AM, Liane Comber CMA (AAMA) wrote: Lab orders please! Good Morning! This pt is scheduled for cpx labs Monday, which labs to draw and dx codes to use? Thanks Tasha ------------------------------

## 2011-01-22 NOTE — Letter (Signed)
Summary: [facility]: Immunoassay Fecal Occult Blood (iFOB) Order Form  [place] [facility]  [address] [place], [place] [address]   Phone: [phone]  Fax: [phone]      [facility]: Immunoassay Fecal Occult Blood (iFOB) Order Form   January 12, 2011 MRN: [id number]   Hunter Hardin 08/18/1935   Physicican Name:_______duncan_________________  Diagnosis Code:_________v76.49_________________      Crawford Givens MD

## 2011-01-22 NOTE — Assessment & Plan Note (Signed)
Summary: CPX/JRR   Vital Signs:  Patient profile:   76 year old male Height:      67 inches Weight:      211.75 pounds BMI:     33.28 Temp:     97.8 degrees F oral Pulse rate:   72 / minute Pulse rhythm:   regular BP sitting:   130 / 68  (left arm) Cuff size:   large  Vitals Entered By: Delilah Shan CMA Josi Roediger Dull) (January 12, 2011 8:50 AM) CC: CPX  Vision Screening:Left eye with correction: 20 / 50 Right eye with correction: 20 / 50 Both eyes with correction: 20 / 50        Vision Entered By: Delilah Shan CMA Syanne Looney Dull) (January 12, 2011 8:51 AM)   History of Present Illness: I have personally reviewed the Medicare Annual Wellness questionnaire and have noted 1.   The patient's medical and social history 2.   Their use of alcohol, tobacco or illicit drugs 3.   Their current medications and supplements 4.   The patient's functional ability including ADL's, fall risks, home safety risks and hearing or visual             impairment. 5.   Diet and physical activities 6.   Evidence for depression or mood disorders  The patients weight, height, BMI and visual acuity have been recorded in the chart I have made referrals, counseling and provided education to the patient based review of the above and I have provided the pt with a written personalized care plan for preventive services.  Elevated Cholesterol: Using medications without problems:yes Muscle aches: no Other complaints:  Hypertension:      Using medication without problems or lightheadedness: yes Chest pain with exertion:no Edema:no Short of breath:no Other issues: labs reviwed with patient.   Hypothyroidism.  Needs to increase replacement by 1/2 tab on Sundays since his TSH slightly up.  Doing well o/w w/o symptoms of under or over replacement. No ant neck pain.   Feeling well o/w w/o complaints.   H/o BPH w/o new symptoms.  No FH of prostate CA.    Preventive Screening-Counseling &  Management  Alcohol-Tobacco     Smoking Status: quit > 6 months  Allergies: No Known Drug Allergies  Past History:  Past Surgical History: Last updated: 04/29/2007 Choleycystectomy  Early 90s  Past Medical History: Hypothyroidism HTN HLD H/o elevated PSA  Family History: Reviewed history from 12/26/2009 and no changes required. Father: died 70 ? MI Mother: died 2 HTN, high chol, MI (In rest home) Brother A  Sister A Breast Mass, treated Sister A    (+) CAD (?MI father) HBP:  (+) Mother DM:  (+) cousin (-) stroke  Social History: Reviewed history from 12/26/2009 and no changes required. Marital Status: Divorced, lives alone Children: 2 children Occupation: Retired Camera operator Retired totally from part-time [employer] Former Smoker, quit 1987 2 PPD x 30 - 60 PYH Alcohol use-no Drug use-no walking for exerciseSmoking Status:  quit > 6 months  Review of Systems       See HPI.  Otherwise negative.    Physical Exam  General:  GEN: nad, alert and oriented HEENT: mucous membranes moist NECK: supple w/o LA, no TMG CV: rrr. PULM: ctab, no inc wob ABD: soft, +bs EXT: no edema SKIN: n acute rash  Prostate:  Prostate gland firm and smooth, symmetric  enlargement, but no nodularity, tenderness, mass, asymmetry or induration.   Impression & Recommendations:  Problem #  1:  Preventive Health Care (ICD-V70.0) Td done, patient to check on zostavax coverage.  D/w patient ZO:[id number] vs IFOB, he is aware of risk/benefit with both.  Elects for IFOB.  D/w patient JX:BJYN and exercise/weight.  labs reviewed wiht patient.   Problem # 2:  ELEVATED PROSTATE SPECIFIC ANTIGEN (ICD-790.93) labs d/w patient.  He does have enlargement, nonbothersome for patient.  No change in meds. PSA <4 and no sig increase.    Problem # 3:  HYPOTHYROIDISM (ICD-244.9) Note change on med and follow up for labs.  His updated medication list for this problem includes:    Levoxyl 50  Mcg Tabs (Levothyroxine sodium) .[name]... 1 by mouth daily except for 1.5 tabs on sunday  Problem # 4:  HYPERTENSION (ICD-401.9) No change in meds.   His updated medication list for this problem includes:    Cardura 8 Mg Tabs (Doxazosin mesylate) .[name]... 1 by mouth daily    Propranolol Hcl 40 Mg Tabs (Propranolol hcl) .[name]... 1 by mouth two times a day    Hydrochlorothiazide 25 Mg Tabs (Hydrochlorothiazide) ..... One tab by mouth in am    Verapamil Hcl Cr 240 Mg Cr-tabs (Verapamil hcl) ..... One tab by mouth daily  Problem # 5:  HYPERLIPIDEMIA (ICD-272.4) lipids controlled.  His updated medication list for this problem includes:    Lipitor 10 Mg Tabs (Atorvastatin calcium) .[name]... 1 by mouth at bedtime  Complete Medication List: 1)  Cardura 8 Mg Tabs (Doxazosin mesylate) .[name].. 1 by mouth daily 2)  Vitamin C Cr 500 Mg Cpcr (Ascorbic acid) .... As needed winter 3)  Propranolol Hcl 40 Mg Tabs (Propranolol hcl) .[name].. 1 by mouth two times a day 4)  Lipitor 10 Mg Tabs (Atorvastatin calcium) .[name].. 1 by mouth at bedtime 5)  Levoxyl 50 Mcg Tabs (Levothyroxine sodium) .[name].. 1 by mouth daily except for 1.5 tabs on sunday 6)  Hydrochlorothiazide 25 Mg Tabs (Hydrochlorothiazide) .... One tab by mouth in am 7)  Fish Oil 1000 Mg Caps (Omega-3 fatty acids) .[name].. 1 by mouth two times a day 8)  B-100 Tabs (Vitamins-lipotropics) .[name].. 1 by mouth two times a day 9)  Verapamil Hcl Cr 240 Mg Cr-tabs (Verapamil hcl) .... One tab by mouth daily  Other Orders: Medicare -1st Annual Wellness Visit [phone]) TD Toxoids IM 7 YR + (21308) Admin 1st Vaccine (65784)  Patient Instructions: 1)  Check with your insurance to see if they will cover the shingles shot.   2)  Try to increase your exercise.   3)  I would increase your thyroid medicine to 1 everyday except for 1.5 on Sunday.  Recheck TSH in 6-8 weeks at lab visit. dx 244.9. 4)  Take care.   5)  Glad to see you today.  6)  Recheck next year.    Orders Added: 1)   Medicare -1st Annual Wellness Visit [G0438] 2)  Est. Patient Level IV [69629] 3)  TD Toxoids IM 7 YR + [90714] 4)  Admin 1st Vaccine [90471]   Immunizations Administered:  Tetanus Vaccine:    Vaccine Type: Td    Site: right deltoid    Mfr: Sanofi Pasteur    Dose: 0.5 ml    Route: IM    Given by: Delilah Shan CMA (AAMA)    Exp. Date: 04/08/2012    Lot #: [id number]    VIS given: 11/07/08 version given January 12, 2011.   Immunizations Administered:  Tetanus Vaccine:    Vaccine Type: Td    Site: right  deltoid    Mfr: Sanofi Pasteur    Dose: 0.5 ml    Route: IM    Given by: Delilah Shan CMA (AAMA)    Exp. Date: 04/08/2012    Lot #: [id number]    VIS given: 11/07/08 version given January 12, 2011.  Current Allergies (reviewed today): No known allergies

## 2011-01-22 NOTE — Letter (Signed)
Summary: Nature conservation officer [employer] Wellness Visit Questionnaire   [employer] Medicare Annual Wellness Visit Questionnaire   Imported By: Beau Fanny 01/14/2011 10:17:34  _____________________________________________________________________  External Attachment:    Type:   Image     Comment:   External Document

## 2011-01-28 NOTE — Letter (Signed)
Summary: Results Follow up Letter  [name] [facility]  [address] [place], [place] [address]   Phone: [phone]  Fax: [phone]    01/22/2011 MRN: [id number]    Hunter Hardin [address] [place], [place]  [address]    Dear Mr. BELT,  The following are the results of your recent test(s):  Test         Result    Pap Smear:        Normal _____  Not Normal _____ Comments: ______________________________________________________ Cholesterol: LDL(Bad cholesterol):         Your goal is less than:         HDL (Good cholesterol):       Your goal is more than: Comments:  ______________________________________________________ Mammogram:        Normal _____  Not Normal _____ Comments:  ___________________________________________________________________ Hemoccult:        Normal __X___  Not normal _______ Comments:  Yearly follow up is recommended.   _____________________________________________________________________ Other Tests:    We routinely do not discuss normal results over the telephone.  If you desire a copy of the results, or you have any questions about this information we can discuss them at your next office visit.   Sincerely,    Dwana Curd. Para March, M.D.  [facility]

## 2011-03-17 ENCOUNTER — Other Ambulatory Visit: Payer: Self-pay | Admitting: *Deleted

## 2011-03-17 MED ORDER — VERAPAMIL HCL ER 240 MG PO TBCR: 240.0000 mg | EXTENDED_RELEASE_TABLET | Freq: Every day | ORAL | Status: DC

## 2011-05-08 NOTE — Letter (Signed)
September 16, 2006     Dr. Diona Browner  [address]  [place], [place] [address]   RE:  Hunter Hardin, Hunter Hardin  MRN:  [id number]  /  DOB:  1935/10/10   Dear Alvis Lemmings,   This is to introduce Meredith Mody, a 76 year old white male whom I am  sending to you for a lesion on the left upper lip.  He is a healthy guy with  no real medical problems and no family history of skin cancer problems that  I am aware of.  He himself has not had any problems, but has a lesion on his  upper lip approximately 1 cm in circumference, slightly oval, that has a  rugal look to it with a mildly depressed central zone and is skin-colored.  He has a history of significant sun exposure in the past.  I just think it  would be reasonable to rule out any kind of basal cell or squamous cell.   PAST MEDICAL HISTORY:  1. Significant for hypertension.  2. Elevated cholesterol.  3. Elevated triglycerides.  4. Hypothyroidism.  5. Benign prostatic hypertrophy.  6. Elevated glucose.   MEDICATIONS:  1. Cardura 8 mg daily.  2. Inderal 40 mg b.i.d.  3. Lipitor 10 mg at night.  4. Levoxyl 50 mcg daily.  5. Verelan SR 240 mg daily.  6. Maxzide 37.5/25 mg, one daily.   ALLERGIES:  No known drug allergies.   He is as I said, very healthy.  I appreciate your seeing him.  I look  forward to your evaluation.    Sincerely,       ______________________________  Arta Silence, MD   RNS/MedQ  DD:  09/16/2006  DT:  09/18/2006  Job #:  [id number]

## 2011-05-25 ENCOUNTER — Other Ambulatory Visit: Payer: Self-pay | Admitting: *Deleted

## 2011-05-25 MED ORDER — PROPRANOLOL HCL 40 MG PO TABS: ORAL_TABLET | ORAL | Status: DC

## 2011-05-25 MED ORDER — DOXAZOSIN MESYLATE 8 MG PO TABS: 8.0000 mg | ORAL_TABLET | Freq: Every day | ORAL | Status: DC

## 2011-09-22 ENCOUNTER — Other Ambulatory Visit: Payer: Self-pay | Admitting: Family Medicine

## 2011-10-06 ENCOUNTER — Other Ambulatory Visit: Payer: Self-pay | Admitting: Family Medicine

## 2011-10-08 ENCOUNTER — Other Ambulatory Visit: Payer: Self-pay | Admitting: Family Medicine

## 2011-10-08 ENCOUNTER — Other Ambulatory Visit: Payer: Self-pay | Admitting: *Deleted

## 2011-10-08 DIAGNOSIS — E039 Hypothyroidism, unspecified: Secondary | ICD-10-CM

## 2011-10-08 DIAGNOSIS — R972 Elevated prostate specific antigen [PSA]: Secondary | ICD-10-CM

## 2011-10-08 DIAGNOSIS — E789 Disorder of lipoprotein metabolism, unspecified: Secondary | ICD-10-CM

## 2011-10-08 MED ORDER — ATORVASTATIN CALCIUM 10 MG PO TABS: 10.0000 mg | ORAL_TABLET | Freq: Every day | ORAL | Status: DC

## 2011-10-08 MED ORDER — HYDROCHLOROTHIAZIDE 25 MG PO TABS: 25.0000 mg | ORAL_TABLET | Freq: Every day | ORAL | Status: DC

## 2011-10-09 ENCOUNTER — Other Ambulatory Visit (INDEPENDENT_AMBULATORY_CARE_PROVIDER_SITE_OTHER): Payer: Medicare Other

## 2011-10-09 DIAGNOSIS — E039 Hypothyroidism, unspecified: Secondary | ICD-10-CM

## 2011-10-09 DIAGNOSIS — R972 Elevated prostate specific antigen [PSA]: Secondary | ICD-10-CM

## 2011-10-09 DIAGNOSIS — E789 Disorder of lipoprotein metabolism, unspecified: Secondary | ICD-10-CM

## 2011-10-09 LAB — LIPID PANEL
Cholesterol: 144 mg/dL (ref 0–200)
HDL: 32.6 mg/dL — ABNORMAL LOW (ref >39.00)
Total CHOL/HDL Ratio: 4
Triglycerides: 255 mg/dL — ABNORMAL HIGH (ref 0.0–149.0)
VLDL: 51 mg/dL — ABNORMAL HIGH (ref 0.0–40.0)

## 2011-10-09 LAB — COMPREHENSIVE METABOLIC PANEL
ALT: 22 U/L (ref 0–53)
CO2: 24 mEq/L (ref 19–32)
Calcium: 9.6 mg/dL (ref 8.4–10.5)
Chloride: 107 mEq/L (ref 96–112)
Creatinine, Ser: 1.5 mg/dL (ref 0.4–1.5)
GFR: 50.53 mL/min — ABNORMAL LOW (ref >60.00)
Glucose, Bld: 113 mg/dL — ABNORMAL HIGH (ref 70–99)
Sodium: 145 mEq/L (ref 135–145)
Total Bilirubin: 0.9 mg/dL (ref 0.3–1.2)
Total Protein: 7.6 g/dL (ref 6.0–8.3)

## 2011-10-09 LAB — TSH: TSH: 3.55 u[IU]/mL (ref 0.35–5.50)

## 2011-10-09 NOTE — Progress Notes (Signed)
Addended by: Alvina Chou on: 10/09/2011 10:18 AM   Modules accepted: Orders

## 2011-10-14 ENCOUNTER — Encounter: Payer: Self-pay | Admitting: Family Medicine

## 2011-10-15 ENCOUNTER — Encounter: Payer: Self-pay | Admitting: Family Medicine

## 2011-10-15 ENCOUNTER — Ambulatory Visit (INDEPENDENT_AMBULATORY_CARE_PROVIDER_SITE_OTHER): Payer: Medicare Other | Admitting: Family Medicine

## 2011-10-15 VITALS — BP 102/64 | HR 62 | Temp 97.7°F | Wt 207.1 lb

## 2011-10-15 DIAGNOSIS — E785 Hyperlipidemia, unspecified: Secondary | ICD-10-CM

## 2011-10-15 DIAGNOSIS — R972 Elevated prostate specific antigen [PSA]: Secondary | ICD-10-CM

## 2011-10-15 DIAGNOSIS — E039 Hypothyroidism, unspecified: Secondary | ICD-10-CM

## 2011-10-15 DIAGNOSIS — Z1211 Encounter for screening for malignant neoplasm of colon: Secondary | ICD-10-CM | POA: Insufficient documentation

## 2011-10-15 DIAGNOSIS — I10 Essential (primary) hypertension: Secondary | ICD-10-CM

## 2011-10-15 DIAGNOSIS — R7989 Other specified abnormal findings of blood chemistry: Secondary | ICD-10-CM

## 2011-10-15 NOTE — Assessment & Plan Note (Signed)
Continue current meds. Labs d/w pt.  

## 2011-10-15 NOTE — Assessment & Plan Note (Signed)
DRE w/o significant abnormality and modest BPH sx.  D/w pt.  We agreed to recheck PSA in 2 months.  If still up, then consider uro eval.  He agrees.  Neg FH prostate CA.

## 2011-10-15 NOTE — Patient Instructions (Addendum)
Check with your insurance to see if they will cover the shingles shot.  See if the recruiting office can give your a phone number to call.  Check with the lab about the stool cards.  Come back for nonfasting labs in 2 months.  We'll let you know about your PSA at that point.  Take care.  Keep walking and working in the yard.

## 2011-10-15 NOTE — Progress Notes (Signed)
Elevated Cholesterol: Using medications without problems: yes Muscle aches: no Diet compliance: "I'm trying." Exercise: some walking and yard work  H/o elevated PSA.  No dysuria.  Stream is variable.  Occ nocturia, depending on last PM intake.  No pelvic pain. No prev prostate intervention.    Colon cancer screening.  D/w patient ZO:XWRUEAV for colon cancer screening, including IFOB vs. colonoscopy.  Risks and benefits of both were discussed and patient voiced understanding.  Pt elects WUJ:WJXB.   Thyroid disease.  Compliant with meds.  No ant neck pain, no dysphagia.  No sudden weight changes.   Mild elevation in glucose.  Not a new problem.  We talked about diet and weight along with exercise.   Hypertension:    Using medication without problems or lightheadedness: yes Chest pain with exertion:no Edema:no Short of breath:no Average home BPs: "it wasn't high" on home check  Meds, vitals, and allergies reviewed.   PMH and SH reviewed  ROS: See HPI.  Otherwise negative.    GEN: nad, alert and oriented HEENT: mucous membranes moist NECK: supple w/o LA CV: rrr. PULM: ctab, no inc wob ABD: soft, +bs EXT: no edema SKIN: no acute rash Prostate gland firm and smooth, mild enlargement, but nonodularity, tenderness, mass, asymmetry or induration.

## 2011-10-15 NOTE — Assessment & Plan Note (Signed)
Continue current meds.  Labs d/w pt.  D/w pt about diet and weight.   

## 2011-10-15 NOTE — Assessment & Plan Note (Signed)
Neg FH, he elects for IFOB, see above.

## 2011-10-15 NOTE — Assessment & Plan Note (Signed)
Continue current meds.  Labs d/w pt.  D/w pt about diet and weight.

## 2011-10-15 NOTE — Assessment & Plan Note (Signed)
Mild, work on diet and weight.  He agrees.  No meds.

## 2011-10-22 ENCOUNTER — Other Ambulatory Visit: Payer: Self-pay | Admitting: Family Medicine

## 2011-10-28 ENCOUNTER — Encounter: Payer: Self-pay | Admitting: *Deleted

## 2011-10-28 DIAGNOSIS — Z1289 Encounter for screening for malignant neoplasm of other sites: Secondary | ICD-10-CM

## 2011-10-28 DIAGNOSIS — Z1211 Encounter for screening for malignant neoplasm of colon: Secondary | ICD-10-CM

## 2011-12-07 ENCOUNTER — Other Ambulatory Visit (INDEPENDENT_AMBULATORY_CARE_PROVIDER_SITE_OTHER): Payer: Medicare Other

## 2011-12-07 DIAGNOSIS — R972 Elevated prostate specific antigen [PSA]: Secondary | ICD-10-CM

## 2011-12-08 ENCOUNTER — Ambulatory Visit (INDEPENDENT_AMBULATORY_CARE_PROVIDER_SITE_OTHER): Payer: Medicare Other | Admitting: *Deleted

## 2011-12-08 ENCOUNTER — Other Ambulatory Visit: Payer: Self-pay | Admitting: Family Medicine

## 2011-12-08 DIAGNOSIS — R972 Elevated prostate specific antigen [PSA]: Secondary | ICD-10-CM

## 2011-12-08 DIAGNOSIS — Z23 Encounter for immunization: Secondary | ICD-10-CM

## 2011-12-08 LAB — PSA, TOTAL AND FREE: PSA: 4.75 ng/mL — ABNORMAL HIGH (ref <=4.00)

## 2011-12-16 ENCOUNTER — Other Ambulatory Visit: Payer: Self-pay | Admitting: Internal Medicine

## 2011-12-16 MED ORDER — PROPRANOLOL HCL 40 MG PO TABS: ORAL_TABLET | ORAL | Status: DC

## 2011-12-16 NOTE — Telephone Encounter (Signed)
Faxed refill request for Propranolol to pharmacy.

## 2011-12-27 ENCOUNTER — Encounter: Payer: Self-pay | Admitting: Family Medicine

## 2012-01-01 ENCOUNTER — Other Ambulatory Visit: Payer: Self-pay | Admitting: *Deleted

## 2012-01-01 MED ORDER — VERAPAMIL HCL ER 240 MG PO TBCR: 240.0000 mg | EXTENDED_RELEASE_TABLET | Freq: Every day | ORAL | Status: DC

## 2012-03-09 ENCOUNTER — Other Ambulatory Visit: Payer: Self-pay | Admitting: *Deleted

## 2012-03-09 MED ORDER — DOXAZOSIN MESYLATE 8 MG PO TABS: 8.0000 mg | ORAL_TABLET | Freq: Every day | ORAL | Status: DC

## 2012-04-20 ENCOUNTER — Other Ambulatory Visit: Payer: Self-pay

## 2012-04-20 MED ORDER — ATORVASTATIN CALCIUM 10 MG PO TABS: 10.0000 mg | ORAL_TABLET | Freq: Every day | ORAL | Status: DC

## 2012-04-20 MED ORDER — HYDROCHLOROTHIAZIDE 25 MG PO TABS: 25.0000 mg | ORAL_TABLET | Freq: Every day | ORAL | Status: DC

## 2012-04-20 NOTE — Telephone Encounter (Signed)
Pt request refill Lipitor 10 mg #90 x 3 and HCTZ 25 mg #90 x 3 to [facility].Pt notified by phone rxs sent.

## 2012-09-13 ENCOUNTER — Other Ambulatory Visit: Payer: Self-pay | Admitting: Family Medicine

## 2012-09-13 NOTE — Telephone Encounter (Signed)
Sent, please set up CPE and labs ahead of time.  Thanks.

## 2012-09-13 NOTE — Telephone Encounter (Signed)
Patient advised.  Will call within the next week or so to schedule appt.

## 2012-09-13 NOTE — Telephone Encounter (Signed)
Electronic refill request.  Looks like patient may need OV.  Please advise.

## 2012-09-20 DIAGNOSIS — H40009 Preglaucoma, unspecified, unspecified eye: Secondary | ICD-10-CM | POA: Diagnosis not present

## 2012-09-20 DIAGNOSIS — I10 Essential (primary) hypertension: Secondary | ICD-10-CM | POA: Diagnosis not present

## 2012-09-20 DIAGNOSIS — H353190 Nonexudative age-related macular degeneration, unspecified eye, stage unspecified: Secondary | ICD-10-CM | POA: Diagnosis not present

## 2012-09-20 DIAGNOSIS — H35039 Hypertensive retinopathy, unspecified eye: Secondary | ICD-10-CM | POA: Diagnosis not present

## 2012-09-30 DIAGNOSIS — L219 Seborrheic dermatitis, unspecified: Secondary | ICD-10-CM | POA: Diagnosis not present

## 2012-09-30 DIAGNOSIS — L821 Other seborrheic keratosis: Secondary | ICD-10-CM | POA: Diagnosis not present

## 2012-09-30 DIAGNOSIS — L570 Actinic keratosis: Secondary | ICD-10-CM | POA: Diagnosis not present

## 2012-10-23 ENCOUNTER — Other Ambulatory Visit: Payer: Self-pay | Admitting: Family Medicine

## 2012-10-23 DIAGNOSIS — E7800 Pure hypercholesterolemia, unspecified: Secondary | ICD-10-CM

## 2012-10-23 DIAGNOSIS — E039 Hypothyroidism, unspecified: Secondary | ICD-10-CM

## 2012-10-27 ENCOUNTER — Other Ambulatory Visit (INDEPENDENT_AMBULATORY_CARE_PROVIDER_SITE_OTHER): Payer: Medicare Other

## 2012-10-27 DIAGNOSIS — E039 Hypothyroidism, unspecified: Secondary | ICD-10-CM

## 2012-10-27 DIAGNOSIS — E7800 Pure hypercholesterolemia, unspecified: Secondary | ICD-10-CM

## 2012-10-27 LAB — LIPID PANEL
HDL: 27.4 mg/dL — ABNORMAL LOW (ref >39.00)
Total CHOL/HDL Ratio: 5
Triglycerides: 306 mg/dL — ABNORMAL HIGH (ref 0.0–149.0)
VLDL: 61.2 mg/dL — ABNORMAL HIGH (ref 0.0–40.0)

## 2012-10-27 LAB — COMPREHENSIVE METABOLIC PANEL
ALT: 24 U/L (ref 0–53)
AST: 18 U/L (ref 0–37)
Alkaline Phosphatase: 36 U/L — ABNORMAL LOW (ref 39–117)
Sodium: 139 mEq/L (ref 135–145)
Total Bilirubin: 0.6 mg/dL (ref 0.3–1.2)
Total Protein: 7.3 g/dL (ref 6.0–8.3)

## 2012-10-27 LAB — TSH: TSH: 4.11 u[IU]/mL (ref 0.35–5.50)

## 2012-11-03 ENCOUNTER — Encounter: Payer: Self-pay | Admitting: Family Medicine

## 2012-11-03 ENCOUNTER — Ambulatory Visit (INDEPENDENT_AMBULATORY_CARE_PROVIDER_SITE_OTHER): Payer: Medicare Other | Admitting: Family Medicine

## 2012-11-03 VITALS — BP 142/66 | HR 68 | Temp 97.8°F | Ht 66.0 in | Wt 199.0 lb

## 2012-11-03 DIAGNOSIS — Z0000 Encounter for general adult medical examination without abnormal findings: Secondary | ICD-10-CM | POA: Diagnosis not present

## 2012-11-03 DIAGNOSIS — Z1211 Encounter for screening for malignant neoplasm of colon: Secondary | ICD-10-CM | POA: Diagnosis not present

## 2012-11-03 DIAGNOSIS — I10 Essential (primary) hypertension: Secondary | ICD-10-CM | POA: Diagnosis not present

## 2012-11-03 DIAGNOSIS — E039 Hypothyroidism, unspecified: Secondary | ICD-10-CM

## 2012-11-03 DIAGNOSIS — E785 Hyperlipidemia, unspecified: Secondary | ICD-10-CM | POA: Diagnosis not present

## 2012-11-03 DIAGNOSIS — R972 Elevated prostate specific antigen [PSA]: Secondary | ICD-10-CM

## 2012-11-03 NOTE — Patient Instructions (Addendum)
Go to the lab on the way out.  We'll contact you with your lab report. Take care. I'll await the urology notes.

## 2012-11-04 DIAGNOSIS — Z0000 Encounter for general adult medical examination without abnormal findings: Secondary | ICD-10-CM | POA: Insufficient documentation

## 2012-11-04 NOTE — Assessment & Plan Note (Signed)
TSH wnl, continue as is. No TMG on exam.  

## 2012-11-04 NOTE — Assessment & Plan Note (Signed)
Continue work on diet/weight for sugar and TG.  Continue lipitor.  He agrees.

## 2012-11-04 NOTE — Progress Notes (Signed)
I have personally reviewed the Medicare Annual Wellness questionnaire and have noted 1. The patient's medical and social history 2. Their use of alcohol, tobacco or illicit drugs 3. Their current medications and supplements 4. The patient's functional ability including ADL's, fall risks, home safety risks and hearing or visual             impairment. 5. Diet and physical activities 6. Evidence for depression or mood disorders  The patients weight, height, BMI have been recorded in the chart and visual acuity is per eye clinic.  I have made referrals, counseling and provided education to the patient based review of the above and I have provided the pt with a written personalized care plan for preventive services.  See scanned forms.  Routine anticipatory guidance given to patient.  See health maintenance. Tetanus 2012 Flu yearly Shingles 2012 PNA 2006 D/w patient JW:[id number] for colon cancer screening, including IFOB vs. colonoscopy.  Risks and benefits of both were discussed and patient voiced understanding.  Pt elects GNF:AOZH Prostate cancer screening per uro Advance directive d/w pt.  Would want his sister Elson Clan to be designated until his sons (who live out of town) could be contacted.   Elevated PSA w/o changes in stream.  Has w/u with uro pending, so we didn't check PSA or DRE today.  He agrees with this plan.  Hypertension:    Using medication without problems or lightheadedness: yes Chest pain with exertion:no Edema:no Short of breath:no  Hypothyroid.  No goiter, ant neck pain or neck mass.  Labs d/w pt.    Elevated Cholesterol: Using medications without problems:yes Muscle aches: no Diet compliance:yes Exercise:yes  PMH and SH reviewed  Meds, vitals, and allergies reviewed.   ROS: See HPI.  Otherwise negative.    GEN: nad, alert and oriented HEENT: mucous membranes moist NECK: supple w/o LA CV: rrr. PULM: ctab, no inc wob ABD: soft, +bs EXT: no edema SKIN: no  acute rash

## 2012-11-04 NOTE — Assessment & Plan Note (Signed)
See scanned forms.  Routine anticipatory guidance given to patient.  See health maintenance. Tetanus 2012 Flu yearly Shingles 2012 PNA 2006 D/w patient ZO:XWRUEAV for colon cancer screening, including IFOB vs. colonoscopy.  Risks and benefits of both were discussed and patient voiced understanding.  Pt elects WUJ:WJXB Prostate cancer screening per uro Advance directive d/w pt.  Would want his sister Elson Clan to be designated until his sons (who live out of town) could be contacted.

## 2012-11-04 NOTE — Assessment & Plan Note (Signed)
Continue meds, will work on diet and weight.

## 2012-11-04 NOTE — Assessment & Plan Note (Signed)
Per u rology 

## 2012-11-10 ENCOUNTER — Other Ambulatory Visit (INDEPENDENT_AMBULATORY_CARE_PROVIDER_SITE_OTHER): Payer: Medicare Other

## 2012-11-10 ENCOUNTER — Other Ambulatory Visit: Payer: Self-pay | Admitting: Family Medicine

## 2012-11-10 DIAGNOSIS — Z1211 Encounter for screening for malignant neoplasm of colon: Secondary | ICD-10-CM | POA: Diagnosis not present

## 2012-11-10 DIAGNOSIS — R195 Other fecal abnormalities: Secondary | ICD-10-CM

## 2012-11-15 ENCOUNTER — Encounter: Payer: Self-pay | Admitting: Internal Medicine

## 2012-12-04 ENCOUNTER — Other Ambulatory Visit: Payer: Self-pay | Admitting: Family Medicine

## 2012-12-08 ENCOUNTER — Encounter: Payer: Self-pay | Admitting: Internal Medicine

## 2012-12-08 ENCOUNTER — Ambulatory Visit: Payer: Medicare Other | Admitting: Internal Medicine

## 2012-12-08 DIAGNOSIS — R972 Elevated prostate specific antigen [PSA]: Secondary | ICD-10-CM | POA: Diagnosis not present

## 2012-12-09 ENCOUNTER — Ambulatory Visit (INDEPENDENT_AMBULATORY_CARE_PROVIDER_SITE_OTHER): Payer: Medicare Other | Admitting: Internal Medicine

## 2012-12-09 ENCOUNTER — Encounter: Payer: Self-pay | Admitting: Internal Medicine

## 2012-12-09 VITALS — BP 120/62 | HR 74 | Ht 66.0 in | Wt 201.0 lb

## 2012-12-09 DIAGNOSIS — R195 Other fecal abnormalities: Secondary | ICD-10-CM | POA: Diagnosis not present

## 2012-12-09 DIAGNOSIS — K5900 Constipation, unspecified: Secondary | ICD-10-CM | POA: Diagnosis not present

## 2012-12-09 MED ORDER — PEG-KCL-NACL-NASULF-NA ASC-C 100 G PO SOLR: 1.0000 | Freq: Once | ORAL | Status: DC

## 2012-12-09 MED ORDER — POLYETHYLENE GLYCOL 3350 17 GM/SCOOP PO POWD: 17.0000 g | Freq: Every day | ORAL | Status: DC

## 2012-12-09 NOTE — Progress Notes (Signed)
Patient ID: Hunter Hardin., male   DOB: Apr 07, 1935, 77 y.o.   MRN: [id number]  SUBJECTIVE: HPI Hunter Hardin is a 77 year old male with past no history of hypertension, hyperlipidemia, hypothyroidism who is seen in consultation at the request of Dr. Para March for evaluation of heme positive stool. The patient reports that he has not seen blood in his stool nor Hardin. He has noted hard or in larger stools over last 4 months, often necessitating straining at bowel movement. He's tried over-the-counter Colace 100 mg twice daily without much benefit. He denies abdominal pain. No nausea or vomiting. No weight loss. Good appetite. No heartburn, dysphagia or odynophagia. He's never had colonoscopy. He denies family history of colon cancer  Review of Systems  As per history of present illness, otherwise negative   Past Medical History  Diagnosis Date  . Hypothyroidism   . Hypertension   . HLD (hyperlipidemia)   . Elevated PSA     H/O    Current Outpatient Prescriptions  Medication Sig Dispense Refill  . ascorbic Acid (VITAMIN C) 500 MG CPCR Take 500 mg by mouth daily.        [name] atorvastatin (LIPITOR) 10 MG tablet Take 1 tablet (10 mg total) by mouth daily.  90 tablet  3  . doxazosin (CARDURA) 8 MG tablet Take 1 tablet (8 mg total) by mouth daily.  90 tablet  3  . hydrochlorothiazide (HYDRODIURIL) 25 MG tablet Take 1 tablet (25 mg total) by mouth daily.  90 tablet  3  . Omega-3 Fatty Acids (FISH OIL) 1000 MG CAPS Take by mouth 2 (two) times daily.        . propranolol (INDERAL) 40 MG tablet TAKE 1 TABLET BY MOUTH TWICE DAILY  180 tablet  1  . SYNTHROID 50 MCG tablet TAKE 1 TABLET DAILY EXCEPT 1 AND 1/2 TABLETS ON SUNDAY  98 tablet  3  . verapamil (CALAN-SR) 240 MG CR tablet Take 1 tablet (240 mg total) by mouth daily.  90 tablet  3  . peg 3350 powder (MOVIPREP) 100 G SOLR Take 1 kit (100 g total) by mouth once.  1 kit  0  . polyethylene glycol powder (GLYCOLAX/MIRALAX) powder Take 17 g by mouth  daily.  255 g  3    No Known Allergies  Family History  Problem Relation Age of Onset  . Hypertension Mother   . Hyperlipidemia Mother   . Dementia Mother   . Heart disease Father     ? MI, CAD  . Diabetes Cousin   . Cancer Brother     oral/tonsil cancer 2012  . Colon cancer Neg Hx   . Prostate cancer Neg Hx   . Cancer Sister     breast cancer    History  Substance Use Topics  . Smoking status: Former Smoker -- 2.0 packs/day for 30 years    Types: Cigarettes    Quit date: 12/21/1985  . Smokeless tobacco: Never Used  . Alcohol Use: No    OBJECTIVE: BP 120/62  Pulse 74  Ht 5\' 6"  (1.676 m)  Wt 201 lb (91.173 kg)  BMI 32.44 kg/m2  SpO2 98% Constitutional: Well-developed and well-nourished. No distress. HEENT: Normocephalic and atraumatic. Oropharynx is clear and moist. No oropharyngeal exudate. Conjunctivae are normal. No scleral icterus. Neck: Neck supple. Trachea midline. Cardiovascular: Normal rate, regular rhythm and intact distal pulses. 2/6 systolic ejection murmur Pulmonary/chest: Effort normal and breath sounds normal. No wheezing, rales or rhonchi. Abdominal: Soft, nontender, nondistended. Bowel  sounds active throughout.  Extremities: no clubbing, cyanosis, or edema Lymphadenopathy: No cervical adenopathy noted. Neurological: Alert and oriented to person place and time. Skin: Skin is warm and dry. No rashes noted. Psychiatric: Normal mood and affect. Behavior is normal.  Labs and Imaging -- FOBT + CMP     Component Value Date/Time   NA 139 10/27/2012 0818   K 4.0 10/27/2012 0818   CL 103 10/27/2012 0818   CO2 27 10/27/2012 0818   GLUCOSE 107* 10/27/2012 0818   BUN 30* 10/27/2012 0818   CREATININE 1.4 10/27/2012 0818   CALCIUM 9.7 10/27/2012 0818   PROT 7.3 10/27/2012 0818   ALBUMIN 3.7 10/27/2012 0818   AST 18 10/27/2012 0818   ALT 24 10/27/2012 0818   ALKPHOS 36* 10/27/2012 0818   BILITOT 0.6 10/27/2012 0818   GFRNONAA 52.88 12/24/2009 0834   GFRAA 64  11/27/2008 1000    ASSESSMENT AND PLAN: 77 year old male with past no history of hypertension, hyperlipidemia, hypothyroidism who is seen in consultation at the request of Dr. Para March for evaluation of heme positive stool  1.  + FOBT -- given the patient's positive FOBT, I recommended colonoscopy. We discussed this test today including the risks and benefits and he is agreeable to proceed. This test be scheduled for him  2.  Mild constipation -- Colace is not helped soften his stool, and I recommended replacing it with MiraLAX 17 g daily. He is advised that if his stools are too loose he can take this every other day.  He voices understanding

## 2012-12-09 NOTE — Patient Instructions (Addendum)
You have been scheduled for a colonoscopy with propofol. Please follow written instructions given to you at your visit today.  Please pick up your prep kit at the pharmacy within the next 1-3 days. If you use inhalers (even only as needed) or a CPAP machine, please bring them with you on the day of your procedure.  We have sent the following medications to your pharmacy for you to pick up at your convenience: Moviprep  Start taking Miralax 17 g daily

## 2012-12-19 DIAGNOSIS — R972 Elevated prostate specific antigen [PSA]: Secondary | ICD-10-CM | POA: Diagnosis not present

## 2012-12-19 DIAGNOSIS — N400 Enlarged prostate without lower urinary tract symptoms: Secondary | ICD-10-CM | POA: Diagnosis not present

## 2013-01-05 ENCOUNTER — Other Ambulatory Visit: Payer: Self-pay | Admitting: Family Medicine

## 2013-01-21 HISTORY — PX: COLONOSCOPY: SHX174

## 2013-01-25 ENCOUNTER — Encounter: Payer: Self-pay | Admitting: Internal Medicine

## 2013-01-25 ENCOUNTER — Ambulatory Visit (AMBULATORY_SURGERY_CENTER): Payer: Medicare Other | Admitting: Internal Medicine

## 2013-01-25 VITALS — BP 132/70 | HR 50 | Temp 98.2°F | Resp 19 | Ht 66.0 in | Wt 201.0 lb

## 2013-01-25 DIAGNOSIS — R195 Other fecal abnormalities: Secondary | ICD-10-CM

## 2013-01-25 DIAGNOSIS — K5900 Constipation, unspecified: Secondary | ICD-10-CM

## 2013-01-25 DIAGNOSIS — K635 Polyp of colon: Secondary | ICD-10-CM

## 2013-01-25 DIAGNOSIS — D126 Benign neoplasm of colon, unspecified: Secondary | ICD-10-CM

## 2013-01-25 MED ORDER — SODIUM CHLORIDE 0.9 % IV SOLN: 500.0000 mL | INTRAVENOUS | Status: DC

## 2013-01-25 NOTE — Op Note (Signed)
[facility] [address] [place], [address]   COLONOSCOPY PROCEDURE REPORT  PATIENT: Hunter, Hardin  MR#: [id number] BIRTHDATE: December 04, 1935 , 77  yrs. old GENDER: Male ENDOSCOPIST: Beverley Fiedler, MD REFERRED GE:XBMWUX, Cheree Ditto PROCEDURE DATE:  01/25/2013 PROCEDURE:   Colonoscopy with biopsy and Colonoscopy with snare polypectomy ASA CLASS:   Class III INDICATIONS:heme-positive stool and first colonoscopy. MEDICATIONS: MAC sedation, administered by CRNA and Propofol (Diprivan) 180 mg IV  DESCRIPTION OF PROCEDURE:   After the risks benefits and alternatives of the procedure were thoroughly explained, informed consent was obtained.  A digital rectal exam revealed no rectal mass.   The LB CF-H180AL [id number]  endoscope was introduced through the anus and advanced to the cecum, which was identified by both the appendix and ileocecal valve. No adverse events experienced. The quality of the prep was good, using MoviPrep  The instrument was then slowly withdrawn as the colon was fully examined.   COLON FINDINGS: Abnormal, polypoid mucosa was found at the ileocecal valve, extending a few millimeters on the cecal side.  The mucosa was erythematous, nodular and friable with contact bleeding. Unable to cross this area into the terminal ileum.  Multiple biopsies of the area were performed.   A sessile polyp measuring 4 mm in size was found in the ascending colon.  A polypectomy was performed with a cold snare.  The resection was complete and the polyp tissue was completely retrieved.   There was moderate diverticulosis noted in the descending colon and sigmoid colon with associated muscular hypertrophy.  Retroflexed views revealed internal hemorrhoids. The time to cecum=5 minutes 30 seconds. Withdrawal time=17 minutes 32 seconds.  The scope was withdrawn and the procedure completed. COMPLICATIONS: There were no complications.  ENDOSCOPIC IMPRESSION: 1.   Abnormal  mucosa was found at the ileocecal valve; multiple biopsies of the area were performed 2.   Sessile polyp measuring 4 mm in size was found in the ascending colon; polypectomy was performed with a cold snare 3.   There was moderate diverticulosis noted in the descending colon and sigmoid colon 4.   Small internal hemorrhoids  RECOMMENDATIONS: 1.  Await pathology results 2.  My office will arrange for you to have a CT scan of abdomen and pelvis to evaluate the terminal ileum and IC valve. 3.  High fiber diet 4.  You will receive a letter within 1-2 weeks with the results of your biopsy as well as final recommendations.  Please call my office if you have not received a letter after 3 weeks.  eSigned:  Beverley Fiedler, MD 01/25/2013 2:11 PM        cc: The Patient and Crawford Givens, MD

## 2013-01-25 NOTE — Patient Instructions (Addendum)

## 2013-01-25 NOTE — Progress Notes (Signed)
Called to room to assist during endoscopic procedure.  Patient ID and intended procedure confirmed with present staff. Received instructions for my participation in the procedure from the performing physician. ewm 

## 2013-01-25 NOTE — Progress Notes (Signed)
1409 a/ox3 pleased with MAC report to [employer]

## 2013-01-25 NOTE — Progress Notes (Signed)
Patient did not experience any of the following events: a burn prior to discharge; a fall within the facility; wrong site/side/patient/procedure/implant event; or a hospital transfer or hospital admission upon discharge from the facility. (G8907) Patient did not have preoperative order for IV antibiotic SSI prophylaxis. (G8918)  

## 2013-01-26 ENCOUNTER — Telehealth: Payer: Self-pay | Admitting: *Deleted

## 2013-01-26 ENCOUNTER — Other Ambulatory Visit (INDEPENDENT_AMBULATORY_CARE_PROVIDER_SITE_OTHER): Payer: Medicare Other

## 2013-01-26 DIAGNOSIS — K639 Disease of intestine, unspecified: Secondary | ICD-10-CM

## 2013-01-26 DIAGNOSIS — R933 Abnormal findings on diagnostic imaging of other parts of digestive tract: Secondary | ICD-10-CM

## 2013-01-26 DIAGNOSIS — R195 Other fecal abnormalities: Secondary | ICD-10-CM

## 2013-01-26 DIAGNOSIS — K6389 Other specified diseases of intestine: Secondary | ICD-10-CM

## 2013-01-26 LAB — COMPREHENSIVE METABOLIC PANEL
ALT: 24 U/L (ref 0–53)
CO2: 26 mEq/L (ref 19–32)
Calcium: 8.7 mg/dL (ref 8.4–10.5)
Chloride: 106 mEq/L (ref 96–112)
GFR: 52.87 mL/min — ABNORMAL LOW (ref >60.00)
Sodium: 140 mEq/L (ref 135–145)
Total Protein: 7 g/dL (ref 6.0–8.3)

## 2013-01-26 LAB — CBC WITH DIFFERENTIAL/PLATELET
Basophils Absolute: 0 10*3/uL (ref 0.0–0.1)
Hemoglobin: 13.2 g/dL (ref 13.0–17.0)
Lymphocytes Relative: 32.8 % (ref 12.0–46.0)
Monocytes Relative: 8.1 % (ref 3.0–12.0)
Platelets: 185 10*3/uL (ref 150.0–400.0)
RDW: 14.2 % (ref 11.5–14.6)

## 2013-01-26 NOTE — Telephone Encounter (Signed)
Pt will have his labs drawn today at [facility]. He will call when he gets home and I will give him instructions for his CT.

## 2013-01-26 NOTE — Telephone Encounter (Signed)
  Follow up Call-  Call back number 01/25/2013  Post procedure Call Back phone  # [phone]  Permission to leave phone message Yes     Patient questions:  Do you have a fever, pain , or abdominal swelling? no Pain Score  0 *  Have you tolerated food without any problems? yes  Have you been able to return to your normal activities? yes  Do you have any questions about your discharge instructions: Diet   no Medications  no Follow up visit  no  Do you have questions or concerns about your Care? no  Actions: * If pain score is 4 or above: No action needed, pain <4.

## 2013-01-26 NOTE — Telephone Encounter (Signed)
Informed pt of CT tomorrow. Gave him directions to site, NPO after midnight, drink 1st bottle at 0630am, 2nd at 0730am, and arrive at 0815am. Pt stated understanding.

## 2013-01-27 ENCOUNTER — Telehealth: Payer: Self-pay | Admitting: *Deleted

## 2013-01-27 ENCOUNTER — Ambulatory Visit (INDEPENDENT_AMBULATORY_CARE_PROVIDER_SITE_OTHER)
Admission: RE | Admit: 2013-01-27 | Discharge: 2013-01-27 | Disposition: A | Discharge status: Home / Self Care | Payer: Medicare Other | Source: Ambulatory Visit | Attending: Internal Medicine | Admitting: Internal Medicine

## 2013-01-27 DIAGNOSIS — K639 Disease of intestine, unspecified: Secondary | ICD-10-CM

## 2013-01-27 DIAGNOSIS — R918 Other nonspecific abnormal finding of lung field: Secondary | ICD-10-CM

## 2013-01-27 DIAGNOSIS — R933 Abnormal findings on diagnostic imaging of other parts of digestive tract: Secondary | ICD-10-CM

## 2013-01-27 DIAGNOSIS — N289 Disorder of kidney and ureter, unspecified: Secondary | ICD-10-CM | POA: Diagnosis not present

## 2013-01-27 DIAGNOSIS — N2889 Other specified disorders of kidney and ureter: Secondary | ICD-10-CM

## 2013-01-27 DIAGNOSIS — K6389 Other specified diseases of intestine: Secondary | ICD-10-CM

## 2013-01-27 MED ORDER — IOHEXOL 300 MG/ML  SOLN
100.0000 mL | Freq: Once | INTRAMUSCULAR | Status: AC | PRN
  Administered 2013-01-27: 100 mL via INTRAVENOUS

## 2013-01-27 NOTE — Telephone Encounter (Signed)
Faxed info for Urology referral to [facility]. Pt has an appt with Dr Mena Goes on 02/09/13 at 2:35PM. Pt needs to be notified of the appt.

## 2013-01-30 NOTE — Telephone Encounter (Signed)
Informed pt  Shared CT scan results with patient by phone   We discussed the left kidney mass concerning for RCC. I informed him of the PET scan and urology referral. He needs details about the PET scan as far as timing and instructions, please place urology referral   Awaiting biopsies from TI/IC valve      Attached to   of his appts at [facility] and for the PET Scan this week. Pt stated understanding.

## 2013-02-02 ENCOUNTER — Encounter (HOSPITAL_COMMUNITY): Admission: RE | Admit: 2013-02-02 | Payer: Medicare Other | Source: Ambulatory Visit

## 2013-02-06 ENCOUNTER — Telehealth: Payer: Self-pay | Admitting: *Deleted

## 2013-02-06 NOTE — Telephone Encounter (Signed)
lmom for pt to call back. Pt will see Dr Romie Levee on 02/22/13, arrive at 3:30pm.

## 2013-02-06 NOTE — Telephone Encounter (Signed)
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 At colonoscopy this was not a discrete area that could be endoscopically resected      With this in mind, I do think he will need an ileocecectomy for complete resection. A PET scan scheduled for Wednesday will give [place] more information      We will arrange general surgery referral      He is also seeing urology later this week to discuss the solid left renal mass seen at abdominal CT      He may need both an ileocecectomy and a partial nephrectomy, if this is the case hopefully they can be coordinated for one surgical appointment/procedure      This discussion by phone is in lieu of the pathology letter      He will need repeat colonoscopy one year after surgical resection ------

## 2013-02-07 NOTE — Telephone Encounter (Signed)
Informed pt of his appt with Dr Maisie Fus; informed him this could all change with his PET results, but we will keep him informed. Pt stated understanding. I call Pet to call pt and go over prep instructions again. Pt stated understanding.

## 2013-02-08 ENCOUNTER — Encounter (HOSPITAL_COMMUNITY)
Admission: RE | Admit: 2013-02-08 | Discharge: 2013-02-08 | Disposition: A | Discharge status: Home / Self Care | Payer: Medicare Other | Source: Ambulatory Visit | Attending: Internal Medicine | Admitting: Internal Medicine

## 2013-02-08 ENCOUNTER — Encounter (HOSPITAL_COMMUNITY): Payer: Self-pay

## 2013-02-08 DIAGNOSIS — K639 Disease of intestine, unspecified: Secondary | ICD-10-CM | POA: Diagnosis not present

## 2013-02-08 DIAGNOSIS — R918 Other nonspecific abnormal finding of lung field: Secondary | ICD-10-CM | POA: Diagnosis not present

## 2013-02-08 DIAGNOSIS — N289 Disorder of kidney and ureter, unspecified: Secondary | ICD-10-CM | POA: Insufficient documentation

## 2013-02-08 DIAGNOSIS — R599 Enlarged lymph nodes, unspecified: Secondary | ICD-10-CM | POA: Insufficient documentation

## 2013-02-08 DIAGNOSIS — C649 Malignant neoplasm of unspecified kidney, except renal pelvis: Secondary | ICD-10-CM | POA: Diagnosis not present

## 2013-02-08 DIAGNOSIS — R911 Solitary pulmonary nodule: Secondary | ICD-10-CM | POA: Diagnosis not present

## 2013-02-08 LAB — GLUCOSE, CAPILLARY: Glucose-Capillary: 103 mg/dL — ABNORMAL HIGH (ref 70–99)

## 2013-02-08 MED ORDER — FLUDEOXYGLUCOSE F - 18 (FDG) INJECTION
18.4000 | Freq: Once | INTRAVENOUS | Status: AC | PRN
  Administered 2013-02-08: 18.4 via INTRAVENOUS

## 2013-02-09 ENCOUNTER — Telehealth: Payer: Self-pay | Admitting: *Deleted

## 2013-02-09 NOTE — Telephone Encounter (Signed)
Message copied by Florene Glen on Thu Feb 09, 2013  3:52 PM ------      Message from: Beverley Fiedler      Created: Thu Feb 09, 2013  3:35 PM       PET scan is reviewed and reveals hypermetabolic areas in the left kidney indicating likely cancer, and in the area of the terminal ileum/cecum which was recently biopsied at colonoscopy, likely indicating colon cancer (versus metastatic cancer -- I cannot be sure this is not metastatic renal cell at this location) at the IC valve      He needs to be seen by medical oncology, and should be awaiting urological and surgical consultations ------

## 2013-02-09 NOTE — Telephone Encounter (Signed)
Called pt to inform him someone will call him for an appt at the [facility] and I don't want him to be alarmed. The PET Scan showed questionable areas in the Left Kidney and in the area of his Colon Dr Rhea Belton found. He will see Dr Maisie Fus on 02/22/13 and in the meantime, he may call us for questions. Pt stated understanding. I also spoke with Dr Mena Goes at [facility] to make sure he saw the PET results, which he had not. Asked if there was any way he could coordinate the surgeries with Dr Romie Levee and he stated he doubts it. He will contact Dr Maisie Fus at some point, but he felt like both surgeons would work better via lap surgeries; if done together, a large incision would have to be made. He will send [place] notes on today's visit. Staff message sent to Dr Maisie Fus regarding the Pet results.

## 2013-02-10 ENCOUNTER — Telehealth: Payer: Self-pay | Admitting: Internal Medicine

## 2013-02-10 NOTE — Telephone Encounter (Signed)
S/W PT IN REF TO NP APPT. ON 02/21/13@9 :30 REFERRING DR [facility] DX-KIDNEY MASS MAILED NP PACKET

## 2013-02-10 NOTE — Telephone Encounter (Signed)
C/D 02/10/13 for appt. 02/21/13

## 2013-02-20 ENCOUNTER — Other Ambulatory Visit: Payer: Self-pay | Admitting: Medical Oncology

## 2013-02-21 ENCOUNTER — Ambulatory Visit: Payer: Medicare Other

## 2013-02-21 ENCOUNTER — Telehealth: Payer: Self-pay | Admitting: *Deleted

## 2013-02-21 ENCOUNTER — Other Ambulatory Visit: Payer: Self-pay | Admitting: Medical Oncology

## 2013-02-21 ENCOUNTER — Other Ambulatory Visit: Payer: Medicare Other | Admitting: Lab

## 2013-02-21 ENCOUNTER — Ambulatory Visit: Payer: Medicare Other | Admitting: Internal Medicine

## 2013-02-21 NOTE — Telephone Encounter (Signed)
Patient called and left message that he needs to cancel appts for today due to weather. Message given to desk RN and Tiffany in HMI.  JMW

## 2013-02-22 ENCOUNTER — Ambulatory Visit (INDEPENDENT_AMBULATORY_CARE_PROVIDER_SITE_OTHER): Payer: Medicare Other | Admitting: General Surgery

## 2013-02-22 ENCOUNTER — Telehealth: Payer: Self-pay | Admitting: Oncology

## 2013-02-22 ENCOUNTER — Encounter (INDEPENDENT_AMBULATORY_CARE_PROVIDER_SITE_OTHER): Payer: Self-pay | Admitting: General Surgery

## 2013-02-22 VITALS — BP 120/70 | HR 63 | Temp 97.6°F | Resp 20 | Ht 67.0 in | Wt 197.8 lb

## 2013-02-22 DIAGNOSIS — R799 Abnormal finding of blood chemistry, unspecified: Secondary | ICD-10-CM | POA: Diagnosis not present

## 2013-02-22 DIAGNOSIS — K6389 Other specified diseases of intestine: Secondary | ICD-10-CM | POA: Diagnosis not present

## 2013-02-22 NOTE — Telephone Encounter (Signed)
LVOM for pt to return call.  °

## 2013-02-22 NOTE — Patient Instructions (Signed)
Colorectal Cancer  Colorectal cancer is the second most common cancer in the [place], striking 140,000 people annually and causing 60,000 deaths. That's a staggering figure when you consider the disease is potentially curable if diagnosed in the early stages. Who is at risk? Though colorectal cancer may occur at any age, more than 90% of the patients are over age 76, at which point the risk doubles every ten years. In addition to age, other high risk factors include a family history of colorectal cancer and polyps and a personal history of ulcerative colitis, colon polyps or cancer of other organs, especially of the breast or uterus. How does it start? It is generally agreed that nearly all colon and rectal cancer begins in benign polyps. These pre-malignant growths occur on the bowel wall and may eventually increase in size and become cancer. Removal of benign polyps is one aspect of preventive medicine that really works! What are the symptoms? The most common symptoms are rectal bleeding and changes in bowel habits, such as constipation or diarrhea. (These symptoms are also common in other diseases so it is important you receive a thorough examination should you experience them.) Abdominal pain and weight loss are usually late symptoms indicating possible extensive disease. Unfortunately, many polyps and early cancers fail to produce symptoms. Therefore, it is important that your routine physical includes colorectal cancer detection procedures once you reach age 76.  There are several methods for detection of colorectal cancer. These include digital rectal examination, a chemical test of the stool for blood, flexible sigmoidoscopy and colonoscopy (lighted tubular instruments used to inspect the lower bowel) and barium enema. Be sure to discuss these options with your surgeon to determine which procedure is best for you. Individuals who have a first-degree relative (parent or sibling) with colon  cancer or polyps should start their colon cancer screening at the age of 23. How is colorectal cancer treated? Colorectal cancer requires surgery in nearly all cases for complete cure. Radiation and chemotherapy are sometimes used in addition to surgery. Between 80-90% are restored to normal health if the cancer is detected and treated in the earliest stages. The cure rate drops to 50% or less when diagnosed in the later stages. Thanks to [employer], less than 5% of all colorectal cancer patients require a colostomy, the surgical construction of an artificial excretory opening from the colon. Can colon cancer be prevented? Colon cancer is very preventable. The most important step towards preventing colon cancer is getting a screening test. Any abnormal screening test should be followed by a colonoscopy. Some individuals prefer to start with colonoscopy as a screening test. Colonoscopy provides a detailed examination of the bowel. Polyps can be identified and can often be removed during colonoscopy. Though not definitely proven, there is some evidence that diet may play a significant role in preventing colorectal cancer. As far as we know, a high fiber, low fat diet is the only dietary measure that might help prevent colorectal cancer. Finally, pay attention to changes in your bowel habits. Any new changes such as persistent constipation, diarrhea, or blood in the stool should be discussed with your physician.   Can hemorrhoids lead to colon cancer? No, but hemorrhoids may produce symptoms similar to colon polyps or cancer. Should you experience these symptoms, you should have them examined and evaluated by a physician, preferably by a colon and rectal surgeon. What is a colon and rectal surgeon? Colon and rectal surgeons are experts in the surgical and non-surgical treatment  of diseases of the colon, rectum and anus. They have completed advanced surgical training in the treatment of these  diseases as well as full general surgical training. Board-certified colon and rectal surgeons complete residencies in general surgery and colon and rectal surgery, and pass intensive examinations conducted by the American Board of Surgery and the American Board of Colon and Rectal Surgery. They are well-versed in the treatment of both benign and malignant diseases of the colon, rectum and anus and are able to perform routine screening examinations and surgically treat conditions if indicated to do so.  2012 American Society of Colon & Rectal Surgeons   [facility]  ONE-DAY (1) PRE-OP HOME COLON PREP INSTRUCTIONS: ** MIRALAX / GATORADE PREP **  You must follow the instructions below carefully.  If you have questions or problems, please call and speak to someone in the clinic department at our office:   [phone].     INSTRUCTIONS: 1. Five days prior to your procedure do not eat nuts, popcorn, or fruit with seeds.  Stop all fiber supplements such as Metamucil, Citrucel, etc. 2. Two days before surgery fill the prescription at a pharmacy of your choice and purchase the additional supplies below.         MIRALAX - GATORADE -- DULCOLAX TABS    Purchase a bottle of MIRALAX  (255 gm bottle)    In addition, purchase four (2) DULCOLAX TABLETS (no prescription required- ask the pharmacist if you can't find them)   Purchase one 32 oz GATORADE.  (Do NOT purchase red Gatorade; any other flavor is acceptable) and place in refrigerator to get cold.  3.   Day Before Surgery:   6 am: Wash you abdomen with soap and repeat this on the morning of surgery and take 4 Dulcolax tablets   You may only have clear liquids (tea, coffee, juice, broth, jello, soft drinks, gummy bears).  You cannot have solid foods, cream, milk or milk products.  Drink at lease 8 ounces of liquids every hour while awake.   Mix half bottle of MiraLax and the Gatorade in a large container.    10:00am: Begin drinking the Gatorade  mixture until gone (8 oz every 15-30 minutes).      You may suck on a lime wedge or hard candy to "freshen your palate" in between glasses   If you are a diabetic, take your blood sugar reading several time throughout the prep.  Have some juice available to take if your sugar level gets too low   You may feel chilled while taking the prep.  Have some warm tea or broth to help warm up.   Continue clear liquids until midnight or bedtime  3. The day of your procedure:   Do not eat or drink ANYTHING after midnight before your surgery.     If you take Heart or Blood Pressure medicine, ask the pre-op nurses about these during your preop appointment.    Further pre-operative instructions will be given to you from the hospital.   Expect to be contacted 5-7 days before your surgery.

## 2013-02-22 NOTE — Progress Notes (Signed)
Chief Complaint  Patient presents with  . New Evaluation    eval possible ileoectomy    HISTORY: Hunter Hardin. is a 78 y.o. 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    Workup thus far has included abd CT which showed evidence of TI enlargement.  Biopsies showed at least HGD. He denies any nausea, vomiting but has had some bloating and recent weight loss of about 10lbs.  He states that he has had trouble with constipation recently but this is getting better on the miralax.  He is currently retired and lives alone.  He can walk a mile without getting chest pain.  Of note the CT scan showed a L renal mass as well.  He is currently see Dr Mena Goes about this.     Past Medical History  Diagnosis Date  . Hypothyroidism   . Hypertension   . HLD (hyperlipidemia)   . Elevated PSA     H/O      Past Surgical History  Procedure Laterality Date  . Cholecystectomy  early 22's  . Vasectomy        Current Outpatient Prescriptions  Medication Sig Dispense Refill  . ascorbic Acid (VITAMIN C) 500 MG CPCR Take 500 mg by mouth daily.        [name] atorvastatin (LIPITOR) 10 MG tablet Take 1 tablet (10 mg total) by mouth daily.  90 tablet  3  . doxazosin (CARDURA) 8 MG tablet Take 1 tablet (8 mg total) by mouth daily.  90 tablet  3  . hydrochlorothiazide (HYDRODIURIL) 25 MG tablet Take 1 tablet (25 mg total) by mouth daily.  90 tablet  3  . Omega-3 Fatty Acids (FISH OIL) 1000 MG CAPS Take by mouth 2 (two) times daily.        . polyethylene glycol powder (GLYCOLAX/MIRALAX) powder Take 17 g by mouth daily.  255 g  3  . propranolol (INDERAL) 40 MG tablet TAKE 1 TABLET BY MOUTH TWICE DAILY  180 tablet  1  . SYNTHROID 50 MCG tablet TAKE 1 TABLET DAILY EXCEPT 1 AND 1/2 TABLETS ON SUNDAY  98 tablet  3  . verapamil (CALAN-SR) 240 MG CR tablet TAKE 1 TABLET BY MOUTH DAILY  90 tablet  1   No current facility-administered medications for this visit.      No Known  Allergies    Family History  Problem Relation Age of Onset  . Hypertension Mother   . Hyperlipidemia Mother   . Dementia Mother   . Heart disease Father     ? MI, CAD  . Diabetes Cousin   . Cancer Brother     oral/tonsil cancer 2012  . Colon cancer Neg Hx   . Prostate cancer Neg Hx   . Cancer Sister     breast cancer      History   Social History  . Marital Status: Divorced    Spouse Name: N/A    Number of Children: 2  . Years of Education: N/A   Occupational History  . Retired [employer]     Retired totally from part-time [employer]   Social History Main Topics  . Smoking status: Former Smoker -- 2.00 packs/day for 30 years    Types: Cigarettes    Quit date: 12/21/1985  . Smokeless tobacco: Never Used  . Alcohol Use: No  . Drug Use: No  . Sexually Active: None   Other Topics Concern  . None  Social History Narrative   Divorced, lives alone   Walking for exercise   [employer] [phone]   2 kids, both out of town   Enjoys yard work   Daily caffeine        REVIEW OF SYSTEMS - PERTINENT POSITIVES ONLY: Review of Systems - General ROS: negative for - chills or fever Hematological and Lymphatic ROS: negative for - bleeding problems, bruising, fatigue or jaundice Respiratory ROS: no cough, shortness of breath, or wheezing Cardiovascular ROS: no chest pain or dyspnea on exertion Gastrointestinal ROS: positive for - change in bowel habits negative for - abdominal pain or nausea/vomiting Genito-Urinary ROS: no dysuria, trouble voiding, or hematuria  EXAM: Filed Vitals:   02/22/13 1316  BP: 120/70  Pulse: 63  Temp: 97.6 F (36.4 C)  Resp: 20     Gen:  No acute distress.  Well nourished and well groomed.   Neurological: Alert and oriented to person, place, and time. Coordination normal.  Head: Normocephalic and atraumatic.  Eyes: Conjunctivae are normal. Pupils are equal, round, and reactive to light. No scleral icterus.  Neck: Normal range  of motion. Neck supple. No tracheal deviation or thyromegaly present.  No cervical lymphadenopathy. Cardiovascular: Normal rate, regular rhythm, normal heart sounds and intact distal pulses.  Exam reveals no gallop and no friction rub.  No murmur heard. Respiratory: Effort normal.  No respiratory distress. No chest wall tenderness. Breath sounds normal.  No wheezes, rales or rhonchi.  GI: Soft. Bowel sounds are normal. The abdomen is soft and nontender.  There is no rebound and no guarding. Kocher scar noted, no definite hernias plapated Musculoskeletal: Normal range of motion. Extremities are nontender.  Skin: Skin is warm and dry. No rash noted. No diaphoresis. No erythema. No pallor. No clubbing, cyanosis, or edema.   Psychiatric: Normal mood and affect. Behavior is normal. Judgment and thought content normal.      LABORATORY RESULTS: Available labs are reviewed  CEA: pend  RADIOLOGY RESULTS:   Images and reports are reviewed. CT Abd IMPRESSION:  1. Mass arising from upper pole of the left kidney is worrisome for renal cell carcinoma. Urologic consultation should be considered.  2. Although no cecal mass is identified, there is increased caliber of the terminal ileum which may be partially obstructed.  Correlation with colonoscopy results advised.  3. There are two indeterminate pulmonary nodules within the left lower lobe. These are increased in attenuation and may represent granulomas, but are not densely calcified. Consider further evaluation with PET CT.  4. Small bilateral pleural effusions.  ASSESSMENT AND PLAN: Hunter Hardin. is a 78 y.o. M who was referred to me for an ileocecal valve mass.  On review of his information, biopsy results and CT scan, I am concerned that this may be a cancer and not just a polyp.  I discussed this case with Dr Mena Goes today, and he felt that the risk of separate surgeries would be less than the risk of doing them both together.  He also felt  that the malignant potential for the renal tumor was most likely low, and therefore it could wait to be addressed until after the colon surgery.  I am concerned that he may begin to have obstructive symptoms, so I would like to go ahead with a laparoscopic right colectomy.  I have referred him for medical clearance.  We will get a chest CT to follow up on his pulmonary nodules seen on abd CT and we will draw  a CEA just in case this turns out to be a cancer.      The surgery and anatomy were described to the patient as well as the risks of surgery and the possible complications. These include: Bleeding, infection and possible wound complications such as hernia, damage to adjacent structures, leak of surgical connections, which can lead to other surgeries and possibly an ostomy (5-7%), possible need for other procedures, such as abscess drains in radiology, possible prolonged hospital stay, possible diarrhea from removal of part of the colon, possible constipation from narcotics, prolonged fatigue/weakness or appetite loss, possible early recurrence of cancer, possible complications of their medical problems such as heart disease or arrhythmias or lung problems, death (less than 1%). I believe the patient understands and wishes to proceed with the surgery.    Vanita Panda, MD Colon and Rectal Surgery / General Surgery [facility]      Visit Diagnoses: 1. Colonic mass   2. 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 Pulmonary nodules   4. Nonspecific findings on examination of blood      Primary Care Physician: Crawford Givens, MD

## 2013-02-23 ENCOUNTER — Encounter (HOSPITAL_COMMUNITY): Payer: Self-pay | Admitting: Pharmacy Technician

## 2013-02-23 ENCOUNTER — Telehealth: Payer: Self-pay | Admitting: Oncology

## 2013-02-23 NOTE — Telephone Encounter (Signed)
PT called to r/s NP appt 03/25 @ 1:30 w/Dr. Clelia Croft.  Pt scheduled for surgery 03/17.  New calendar mailed.

## 2013-02-27 ENCOUNTER — Ambulatory Visit
Admission: RE | Admit: 2013-02-27 | Discharge: 2013-02-27 | Disposition: A | Discharge status: Home / Self Care | Payer: Medicare Other | Source: Ambulatory Visit | Attending: General Surgery | Admitting: General Surgery

## 2013-02-27 ENCOUNTER — Telehealth (INDEPENDENT_AMBULATORY_CARE_PROVIDER_SITE_OTHER): Payer: Self-pay

## 2013-02-27 MED ORDER — IOHEXOL 300 MG/ML  SOLN
75.0000 mL | Freq: Once | INTRAMUSCULAR | Status: AC | PRN
  Administered 2013-02-27: 75 mL via INTRAVENOUS

## 2013-02-27 NOTE — Telephone Encounter (Signed)
I called the pt and let him know that he needs to schedule an appointment to see Dr Para March for medical clearance.  I told him to call their office and schedule so he can pick the best time that works for him.

## 2013-02-28 LAB — CEA: CEA: 1.6 ng/mL (ref 0.0–5.0)

## 2013-03-01 ENCOUNTER — Encounter (HOSPITAL_COMMUNITY)
Admission: RE | Admit: 2013-03-01 | Discharge: 2013-03-01 | Disposition: A | Discharge status: Home / Self Care | Payer: Medicare Other | Source: Ambulatory Visit | Attending: General Surgery | Admitting: General Surgery

## 2013-03-01 ENCOUNTER — Encounter (HOSPITAL_COMMUNITY): Payer: Self-pay

## 2013-03-01 ENCOUNTER — Telehealth (INDEPENDENT_AMBULATORY_CARE_PROVIDER_SITE_OTHER): Payer: Self-pay | Admitting: General Surgery

## 2013-03-01 DIAGNOSIS — D126 Benign neoplasm of colon, unspecified: Secondary | ICD-10-CM | POA: Diagnosis not present

## 2013-03-01 DIAGNOSIS — K56609 Unspecified intestinal obstruction, unspecified as to partial versus complete obstruction: Secondary | ICD-10-CM | POA: Diagnosis not present

## 2013-03-01 HISTORY — DX: Gastro-esophageal reflux disease without esophagitis: K21.9

## 2013-03-01 LAB — CBC
Hemoglobin: 13.9 g/dL (ref 13.0–17.0)
MCHC: 33.6 g/dL (ref 30.0–36.0)
RDW: 13.9 % (ref 11.5–15.5)
WBC: 9 10*3/uL (ref 4.0–10.5)

## 2013-03-01 LAB — BASIC METABOLIC PANEL
Calcium: 9.9 mg/dL (ref 8.4–10.5)
Creatinine, Ser: 1.26 mg/dL (ref 0.50–1.35)
GFR calc Af Amer: 63 mL/min — ABNORMAL LOW (ref >90)

## 2013-03-01 LAB — SURGICAL PCR SCREEN
MRSA, PCR: NEGATIVE
Staphylococcus aureus: POSITIVE — AB

## 2013-03-01 NOTE — Patient Instructions (Addendum)
22 Hunter Hardin.  03/01/2013   Your procedure is scheduled on: 03/06/13  Report to [facility] at 0700 AM.  Call this number if you have problems the morning of surgery [phone]: [phone]   Remember: please follow bowel prep instructions    Do not eat food or drink liquids After Midnight.     Take these medicines the morning of surgery with A SIP OF WATER: cardura, propranolol    Do not wear jewelry, make-up or nail polish.  Do not wear lotions, powders, or perfumes. You may wear deodorant.  Do not shave 48 hours prior to surgery. Men may shave face and neck.  Do not bring valuables to the hospital.  Contacts, dentures or bridgework may not be worn into surgery.  Leave suitcase in the car. After surgery it may be brought to your room.  For patients admitted to the hospital, checkout time is 11:00 AM the day of discharge.    Please read over the following fact sheets that you were given: MRSA Information, blood fact sheet Birdie Sons, RN  pre op nurse call if needed [phone]    FAILURE TO FOLLOW THESE INSTRUCTIONS MAY RESULT IN CANCELLATION OF YOUR SURGERY   Patient Signature: ___________________________________________

## 2013-03-01 NOTE — Progress Notes (Signed)
CT chest 02/27/13 on [facility]

## 2013-03-01 NOTE — Telephone Encounter (Signed)
I discussed his CT results of pulmonary lymphadenopathy with Dr Mena Goes and then with the patient.  We still believe that the colon mass should be resected first given the CT findings of imminent obstruction.  He is scheduled to see Dr Clelia Croft on the 25th.  I will try to get a CT biopsy of his lung masses while he is in the hospital.

## 2013-03-02 ENCOUNTER — Telehealth (INDEPENDENT_AMBULATORY_CARE_PROVIDER_SITE_OTHER): Payer: Self-pay

## 2013-03-02 ENCOUNTER — Telehealth: Payer: Self-pay | Admitting: Oncology

## 2013-03-02 ENCOUNTER — Ambulatory Visit: Payer: Medicare Other | Admitting: Family Medicine

## 2013-03-02 NOTE — Telephone Encounter (Signed)
C/D 03/02/13 for appt.03/14/13

## 2013-03-02 NOTE — Telephone Encounter (Signed)
I called to check on the pt and his medical clearance.  He said he had an appointment this morning but missed it.  He is rescheduled for tomorrow at 3:30.  I asked him to have Dr Para March to call us or send a note as soon as possible since his surgery is Monday.

## 2013-03-03 ENCOUNTER — Ambulatory Visit (INDEPENDENT_AMBULATORY_CARE_PROVIDER_SITE_OTHER): Payer: Medicare Other | Admitting: Family Medicine

## 2013-03-03 ENCOUNTER — Encounter: Payer: Self-pay | Admitting: Family Medicine

## 2013-03-03 ENCOUNTER — Telehealth (INDEPENDENT_AMBULATORY_CARE_PROVIDER_SITE_OTHER): Payer: Self-pay

## 2013-03-03 VITALS — BP 122/64 | HR 62 | Temp 97.9°F

## 2013-03-03 DIAGNOSIS — Z01818 Encounter for other preprocedural examination: Secondary | ICD-10-CM

## 2013-03-03 NOTE — Progress Notes (Signed)
Pre op eval. Known colonic mass and renal mass.  Plan for partial colectomy with eventual plan to address renal mass.   No h/o MI, CVA, CHF, CABG, stent, DVT.  HTN and chol controlled.  H/o mild hyperglycemia, most recent labs were nonfasting.  Not on coumadin.  Former smoker, no h/o COPD.  No CP, SOB, BLE edema.  Able to walk 1-2 miles.    Recent EKG unremarkable.    Feels well.  No complaints.   PMH and SH reviewed  ROS: See HPI, otherwise noncontributory.  Meds, vitals, and allergies reviewed.   nad ncat Mmm rrr ctab Abd soft, not ttp Normal BS Ext w/o edema Normal radial pulses

## 2013-03-03 NOTE — Patient Instructions (Addendum)
I'll send a note to the surgery clinic.  I would expect them to be in contact with you about scheduling.  Take care.

## 2013-03-03 NOTE — Assessment & Plan Note (Signed)
Pt appears to be appropriately low risk for partial colectomy.  D/w pt about risk/benefit of surgery and all questions answered.  It appears reasonable to proceed, especially given the risk of not having surgery done.  He agrees.  Will defer preop labs to anesthesia and surgery team. Appreciate help of all involved.  >25 min spent with face to face with patient, >50% counseling and/or coordinating care.

## 2013-03-03 NOTE — Telephone Encounter (Signed)
I called to check with pt to see how his ov went with Dr Para March.  He was told he should be ok for surgery and we will get his note.

## 2013-03-06 ENCOUNTER — Encounter (HOSPITAL_COMMUNITY): Payer: Self-pay | Admitting: *Deleted

## 2013-03-06 ENCOUNTER — Inpatient Hospital Stay (HOSPITAL_COMMUNITY): Payer: Medicare Other | Admitting: Anesthesiology

## 2013-03-06 ENCOUNTER — Encounter (HOSPITAL_COMMUNITY)
Admission: RE | Disposition: A | Discharge status: Home / Self Care | Payer: Self-pay | Source: Ambulatory Visit | Attending: General Surgery

## 2013-03-06 ENCOUNTER — Inpatient Hospital Stay (HOSPITAL_COMMUNITY)
Admission: RE | Admit: 2013-03-06 | Discharge: 2013-03-13 | DRG: 330 | Disposition: A | Discharge status: Home / Self Care | Payer: Medicare Other | Source: Ambulatory Visit | Attending: General Surgery | Admitting: General Surgery

## 2013-03-06 ENCOUNTER — Encounter (HOSPITAL_COMMUNITY): Payer: Self-pay | Admitting: Anesthesiology

## 2013-03-06 DIAGNOSIS — Y836 Removal of other organ (partial) (total) as the cause of abnormal reaction of the patient, or of later complication, without mention of misadventure at the time of the procedure: Secondary | ICD-10-CM | POA: Diagnosis not present

## 2013-03-06 DIAGNOSIS — Y9210 Unspecified residential institution as the place of occurrence of the external cause: Secondary | ICD-10-CM | POA: Diagnosis present

## 2013-03-06 DIAGNOSIS — K56609 Unspecified intestinal obstruction, unspecified as to partial versus complete obstruction: Secondary | ICD-10-CM | POA: Diagnosis present

## 2013-03-06 DIAGNOSIS — D126 Benign neoplasm of colon, unspecified: Principal | ICD-10-CM | POA: Diagnosis present

## 2013-03-06 DIAGNOSIS — K929 Disease of digestive system, unspecified: Secondary | ICD-10-CM | POA: Diagnosis not present

## 2013-03-06 DIAGNOSIS — C19 Malignant neoplasm of rectosigmoid junction: Secondary | ICD-10-CM

## 2013-03-06 DIAGNOSIS — K219 Gastro-esophageal reflux disease without esophagitis: Secondary | ICD-10-CM | POA: Diagnosis present

## 2013-03-06 DIAGNOSIS — K560 Paralytic ileus: Secondary | ICD-10-CM | POA: Diagnosis not present

## 2013-03-06 DIAGNOSIS — Z5331 Laparoscopic surgical procedure converted to open procedure: Secondary | ICD-10-CM

## 2013-03-06 DIAGNOSIS — R339 Retention of urine, unspecified: Secondary | ICD-10-CM | POA: Diagnosis not present

## 2013-03-06 DIAGNOSIS — C189 Malignant neoplasm of colon, unspecified: Secondary | ICD-10-CM

## 2013-03-06 DIAGNOSIS — I10 Essential (primary) hypertension: Secondary | ICD-10-CM | POA: Diagnosis present

## 2013-03-06 DIAGNOSIS — E039 Hypothyroidism, unspecified: Secondary | ICD-10-CM | POA: Diagnosis present

## 2013-03-06 HISTORY — PX: LAPAROSCOPIC PARTIAL COLECTOMY: SHX5907

## 2013-03-06 LAB — TYPE AND SCREEN: Antibody Screen: NEGATIVE

## 2013-03-06 SURGERY — LAPAROSCOPIC PARTIAL COLECTOMY
Anesthesia: General | Site: Abdomen | Wound class: Clean Contaminated

## 2013-03-06 MED ORDER — PROPRANOLOL HCL 40 MG PO TABS
40.0000 mg | ORAL_TABLET | Freq: Two times a day (BID) | ORAL | Status: DC
  Administered 2013-03-06 – 2013-03-13 (×12): 40 mg via ORAL
  Filled 2013-03-06 (×15): qty 1

## 2013-03-06 MED ORDER — MIDAZOLAM HCL 5 MG/5ML IJ SOLN
INTRAMUSCULAR | Status: DC | PRN
  Administered 2013-03-06: .5 mg via INTRAVENOUS
  Administered 2013-03-06: 1 mg via INTRAVENOUS

## 2013-03-06 MED ORDER — HYDROMORPHONE HCL PF 1 MG/ML IJ SOLN: 0.2500 mg | INTRAMUSCULAR | Status: DC | PRN

## 2013-03-06 MED ORDER — SUCCINYLCHOLINE CHLORIDE 20 MG/ML IJ SOLN
INTRAMUSCULAR | Status: DC | PRN
  Administered 2013-03-06: 100 mg via INTRAVENOUS

## 2013-03-06 MED ORDER — NEOSTIGMINE METHYLSULFATE 1 MG/ML IJ SOLN
INTRAMUSCULAR | Status: DC | PRN
  Administered 2013-03-06: 2 mg via INTRAVENOUS

## 2013-03-06 MED ORDER — OXYCODONE HCL 5 MG/5ML PO SOLN
5.0000 mg | Freq: Once | ORAL | Status: DC | PRN
  Filled 2013-03-06: qty 5

## 2013-03-06 MED ORDER — LEVOTHYROXINE SODIUM 50 MCG PO TABS
50.0000 ug | ORAL_TABLET | ORAL | Status: DC
  Administered 2013-03-06 – 2013-03-11 (×6): 50 ug via ORAL
  Filled 2013-03-06 (×7): qty 1

## 2013-03-06 MED ORDER — OXYCODONE-ACETAMINOPHEN 5-325 MG PO TABS
1.0000 | ORAL_TABLET | ORAL | Status: DC | PRN
  Filled 2013-03-06: qty 2
  Filled 2013-03-06: qty 1

## 2013-03-06 MED ORDER — HEPARIN SODIUM (PORCINE) 5000 UNIT/ML IJ SOLN
5000.0000 [IU] | Freq: Three times a day (TID) | INTRAMUSCULAR | Status: DC
  Administered 2013-03-06 – 2013-03-13 (×20): 5000 [IU] via SUBCUTANEOUS
  Filled 2013-03-06 (×24): qty 1

## 2013-03-06 MED ORDER — ALVIMOPAN 12 MG PO CAPS
12.0000 mg | ORAL_CAPSULE | Freq: Two times a day (BID) | ORAL | Status: DC
  Administered 2013-03-07 – 2013-03-11 (×9): 12 mg via ORAL
  Filled 2013-03-06 (×10): qty 1

## 2013-03-06 MED ORDER — OXYCODONE HCL 5 MG PO TABS: 5.0000 mg | ORAL_TABLET | Freq: Once | ORAL | Status: DC | PRN

## 2013-03-06 MED ORDER — CISATRACURIUM BESYLATE (PF) 10 MG/5ML IV SOLN
INTRAVENOUS | Status: DC | PRN
  Administered 2013-03-06: 2 mg via INTRAVENOUS
  Administered 2013-03-06: 10 mg via INTRAVENOUS
  Administered 2013-03-06: 2 mg via INTRAVENOUS

## 2013-03-06 MED ORDER — DEXTROSE 5 % IV SOLN
2.0000 g | INTRAVENOUS | Status: AC
  Administered 2013-03-06: 2 g via INTRAVENOUS
  Filled 2013-03-06: qty 2

## 2013-03-06 MED ORDER — ATORVASTATIN CALCIUM 10 MG PO TABS
10.0000 mg | ORAL_TABLET | Freq: Every day | ORAL | Status: DC
  Administered 2013-03-06 – 2013-03-12 (×7): 10 mg via ORAL
  Filled 2013-03-06 (×8): qty 1

## 2013-03-06 MED ORDER — ONDANSETRON HCL 4 MG/2ML IJ SOLN
INTRAMUSCULAR | Status: DC | PRN
  Administered 2013-03-06 (×2): 2 mg via INTRAVENOUS

## 2013-03-06 MED ORDER — MORPHINE SULFATE (PF) 1 MG/ML IV SOLN
INTRAVENOUS | Status: DC
  Administered 2013-03-06: 13:00:00 via INTRAVENOUS
  Administered 2013-03-07 (×3): 3 mg via INTRAVENOUS
  Administered 2013-03-07: 1.5 mg via INTRAVENOUS
  Administered 2013-03-08: 3 mg via INTRAVENOUS
  Administered 2013-03-08: 6 mg via INTRAVENOUS
  Administered 2013-03-08: 1 mg via INTRAVENOUS
  Administered 2013-03-08: 1.5 mg via INTRAVENOUS
  Administered 2013-03-08 – 2013-03-09 (×2): 4.5 mg via INTRAVENOUS
  Administered 2013-03-09: 1.5 mg via INTRAVENOUS
  Administered 2013-03-09 (×2): 2 mg via INTRAVENOUS
  Administered 2013-03-09: 3 mg via INTRAVENOUS
  Administered 2013-03-09: 20:00:00 via INTRAVENOUS
  Administered 2013-03-10: 1.5 mg via INTRAVENOUS
  Administered 2013-03-10: 9 mg via INTRAVENOUS
  Administered 2013-03-10: 3 mg via INTRAVENOUS
  Administered 2013-03-10: 4.5 mg via INTRAVENOUS
  Administered 2013-03-11: 09:00:00 via INTRAVENOUS
  Administered 2013-03-11: 1.5 mg via INTRAVENOUS
  Filled 2013-03-06 (×3): qty 25

## 2013-03-06 MED ORDER — HYDROCHLOROTHIAZIDE 25 MG PO TABS
25.0000 mg | ORAL_TABLET | Freq: Every day | ORAL | Status: DC
  Administered 2013-03-06 – 2013-03-13 (×8): 25 mg via ORAL
  Filled 2013-03-06 (×8): qty 1

## 2013-03-06 MED ORDER — HYDROMORPHONE HCL PF 1 MG/ML IJ SOLN
INTRAMUSCULAR | Status: DC | PRN
  Administered 2013-03-06: 2 mg via INTRAVENOUS

## 2013-03-06 MED ORDER — FENTANYL CITRATE 0.05 MG/ML IJ SOLN
INTRAMUSCULAR | Status: DC | PRN
  Administered 2013-03-06 (×2): 50 ug via INTRAVENOUS

## 2013-03-06 MED ORDER — DEXTROSE 5 % IV SOLN
2.0000 g | Freq: Two times a day (BID) | INTRAVENOUS | Status: AC
  Administered 2013-03-06: 2 g via INTRAVENOUS
  Filled 2013-03-06: qty 2

## 2013-03-06 MED ORDER — VERAPAMIL HCL 240 MG (CO) PO TB24
240.0000 mg | ORAL_TABLET | Freq: Every day | ORAL | Status: DC
Start: 2013-03-06 — End: 2013-03-06

## 2013-03-06 MED ORDER — PROPOFOL 10 MG/ML IV EMUL
INTRAVENOUS | Status: DC | PRN
  Administered 2013-03-06: 120 mg via INTRAVENOUS

## 2013-03-06 MED ORDER — LIDOCAINE HCL (CARDIAC) 20 MG/ML IV SOLN
INTRAVENOUS | Status: DC | PRN
  Administered 2013-03-06: 50 mg via INTRAVENOUS

## 2013-03-06 MED ORDER — PHENYLEPHRINE HCL 10 MG/ML IJ SOLN
INTRAMUSCULAR | Status: DC | PRN
  Administered 2013-03-06: 40 ug via INTRAVENOUS
  Administered 2013-03-06: 20 ug via INTRAVENOUS

## 2013-03-06 MED ORDER — MEPERIDINE HCL 50 MG/ML IJ SOLN: 6.2500 mg | INTRAMUSCULAR | Status: DC | PRN

## 2013-03-06 MED ORDER — DOXAZOSIN MESYLATE 8 MG PO TABS
8.0000 mg | ORAL_TABLET | Freq: Every day | ORAL | Status: DC
  Administered 2013-03-07 – 2013-03-13 (×7): 8 mg via ORAL
  Filled 2013-03-06 (×7): qty 1

## 2013-03-06 MED ORDER — DIPHENHYDRAMINE HCL 12.5 MG/5ML PO ELIX: 12.5000 mg | ORAL_SOLUTION | Freq: Four times a day (QID) | ORAL | Status: DC | PRN

## 2013-03-06 MED ORDER — PROMETHAZINE HCL 25 MG/ML IJ SOLN: 6.2500 mg | INTRAMUSCULAR | Status: DC | PRN

## 2013-03-06 MED ORDER — GLYCOPYRROLATE 0.2 MG/ML IJ SOLN
INTRAMUSCULAR | Status: DC | PRN
  Administered 2013-03-06 (×2): .1 mg via INTRAVENOUS
  Administered 2013-03-06: .2 mg via INTRAVENOUS
  Administered 2013-03-06 (×2): .1 mg via INTRAVENOUS

## 2013-03-06 MED ORDER — LACTATED RINGERS IV SOLN
INTRAVENOUS | Status: DC
  Administered 2013-03-06: 11:00:00 via INTRAVENOUS
  Administered 2013-03-06: 1000 mL via INTRAVENOUS
  Administered 2013-03-06: 10:00:00 via INTRAVENOUS

## 2013-03-06 MED ORDER — EPHEDRINE SULFATE 50 MG/ML IJ SOLN
INTRAMUSCULAR | Status: DC | PRN
  Administered 2013-03-06: 30 mg via INTRAVENOUS
  Administered 2013-03-06: 5 mg via INTRAVENOUS
  Administered 2013-03-06: 10 mg via INTRAVENOUS

## 2013-03-06 MED ORDER — BUPIVACAINE-EPINEPHRINE PF 0.25-1:200000 % IJ SOLN
INTRAMUSCULAR | Status: DC | PRN
  Administered 2013-03-06: 15 mL

## 2013-03-06 MED ORDER — ALVIMOPAN 12 MG PO CAPS
12.0000 mg | ORAL_CAPSULE | Freq: Once | ORAL | Status: AC
  Administered 2013-03-06: 12 mg via ORAL
  Filled 2013-03-06: qty 1

## 2013-03-06 MED ORDER — ACETAMINOPHEN 10 MG/ML IV SOLN
INTRAVENOUS | Status: DC | PRN
  Administered 2013-03-06: 1000 mg via INTRAVENOUS

## 2013-03-06 MED ORDER — ONDANSETRON HCL 4 MG/2ML IJ SOLN
4.0000 mg | Freq: Four times a day (QID) | INTRAMUSCULAR | Status: DC | PRN
  Administered 2013-03-10: 4 mg via INTRAVENOUS
  Filled 2013-03-06: qty 2

## 2013-03-06 MED ORDER — ACETAMINOPHEN 10 MG/ML IV SOLN: 1000.0000 mg | Freq: Once | INTRAVENOUS | Status: DC | PRN

## 2013-03-06 MED ORDER — DEXAMETHASONE SODIUM PHOSPHATE 10 MG/ML IJ SOLN
INTRAMUSCULAR | Status: DC | PRN
  Administered 2013-03-06: 10 mg via INTRAVENOUS

## 2013-03-06 MED ORDER — KCL IN DEXTROSE-NACL 20-5-0.45 MEQ/L-%-% IV SOLN
INTRAVENOUS | Status: DC
  Administered 2013-03-06 – 2013-03-12 (×9): via INTRAVENOUS
  Filled 2013-03-06 (×12): qty 1000

## 2013-03-06 MED ORDER — DIPHENHYDRAMINE HCL 50 MG/ML IJ SOLN: 12.5000 mg | Freq: Four times a day (QID) | INTRAMUSCULAR | Status: DC | PRN

## 2013-03-06 MED ORDER — VERAPAMIL HCL ER 240 MG PO TBCR
240.0000 mg | EXTENDED_RELEASE_TABLET | Freq: Every day | ORAL | Status: DC
  Administered 2013-03-06 – 2013-03-12 (×4): 240 mg via ORAL
  Filled 2013-03-06 (×8): qty 1

## 2013-03-06 MED ORDER — SODIUM CHLORIDE 0.9 % IJ SOLN: 9.0000 mL | INTRAMUSCULAR | Status: DC | PRN

## 2013-03-06 MED ORDER — LEVOTHYROXINE SODIUM 75 MCG PO TABS
75.0000 ug | ORAL_TABLET | ORAL | Status: DC
  Administered 2013-03-12: 75 ug via ORAL
  Filled 2013-03-06: qty 1

## 2013-03-06 MED ORDER — LEVOTHYROXINE SODIUM 50 MCG PO TABS: 50.0000 ug | ORAL_TABLET | Freq: Every evening | ORAL | Status: DC

## 2013-03-06 MED ORDER — LACTATED RINGERS IR SOLN
Status: DC | PRN
  Administered 2013-03-06: 1000 mL

## 2013-03-06 MED ORDER — NALOXONE HCL 0.4 MG/ML IJ SOLN: 0.4000 mg | INTRAMUSCULAR | Status: DC | PRN

## 2013-03-06 SURGICAL SUPPLY — 77 items
APPLIER CLIP 5 13 M/L LIGAMAX5 (MISCELLANEOUS) ×2
APR CLP MED LRG 5 ANG JAW (MISCELLANEOUS) ×1
BLADE EXTENDED COATED 6.5IN (ELECTRODE) ×2 IMPLANT
BLADE HEX COATED 2.75 (ELECTRODE) ×2 IMPLANT
BLADE SURG SZ10 CARB STEEL (BLADE) ×2 IMPLANT
BRR ADH 5X3 SEPRAFILM 6 SHT (MISCELLANEOUS) ×1
CABLE HIGH FREQUENCY MONO STRZ (ELECTRODE) ×2 IMPLANT
CANISTER SUCTION 2500CC (MISCELLANEOUS) ×2 IMPLANT
CELLS DAT CNTRL 66122 CELL SVR (MISCELLANEOUS) ×1 IMPLANT
CHLORAPREP W/TINT 26ML (MISCELLANEOUS) ×2 IMPLANT
CLIP APPLIE 5 13 M/L LIGAMAX5 (MISCELLANEOUS) ×1 IMPLANT
CLOTH BEACON ORANGE TIMEOUT ST (SAFETY) ×2 IMPLANT
COVER MAYO STAND STRL (DRAPES) ×2 IMPLANT
DECANTER SPIKE VIAL GLASS SM (MISCELLANEOUS) ×2 IMPLANT
DRAIN CHANNEL 19F RND (DRAIN) ×2 IMPLANT
DRAPE CAMERA CLOSED 9X96 (DRAPES) ×2 IMPLANT
DRAPE LAPAROSCOPIC ABDOMINAL (DRAPES) ×2 IMPLANT
DRAPE LG THREE QUARTER DISP (DRAPES) ×2 IMPLANT
DRAPE UTILITY 15X26 (DRAPE) ×2 IMPLANT
DRAPE WARM FLUID 44X44 (DRAPE) ×4 IMPLANT
ELECT REM PT RETURN 9FT ADLT (ELECTROSURGICAL) ×2
ELECTRODE REM PT RTRN 9FT ADLT (ELECTROSURGICAL) ×1 IMPLANT
FILTER SMOKE EVAC LAPAROSHD (FILTER) IMPLANT
GLOVE BIOGEL PI IND STRL 7.0 (GLOVE) ×1 IMPLANT
GLOVE BIOGEL PI INDICATOR 7.0 (GLOVE) ×2
GOWN SRG XL XLNG 56XLVL 4 (GOWN DISPOSABLE) ×2 IMPLANT
GOWN STRL NON-REIN LRG LVL3 (GOWN DISPOSABLE) ×3 IMPLANT
GOWN STRL NON-REIN XL XLG LVL4 (GOWN DISPOSABLE) ×8
GOWN STRL REIN XL XLG (GOWN DISPOSABLE) ×6 IMPLANT
GRASPER LAPSCPC 5X35 EPIX (ENDOMECHANICALS) IMPLANT
HAND ACTIVATED (MISCELLANEOUS) IMPLANT
KIT BASIN OR (CUSTOM PROCEDURE TRAY) ×2 IMPLANT
LIGASURE IMPACT 36 18CM CVD LR (INSTRUMENTS) IMPLANT
NS IRRIG 1000ML POUR BTL (IV SOLUTION) ×2 IMPLANT
PACK GENERAL/GYN (CUSTOM PROCEDURE TRAY) ×2 IMPLANT
PENCIL BUTTON HOLSTER BLD 10FT (ELECTRODE) ×2 IMPLANT
RELOAD PROXIMATE 75MM BLUE (ENDOMECHANICALS) ×4 IMPLANT
RELOAD STAPLE 75 3.8 BLU REG (ENDOMECHANICALS) IMPLANT
RETRACTOR WND ALEXIS 18 MED (MISCELLANEOUS) IMPLANT
RTRCTR WOUND ALEXIS 18CM MED (MISCELLANEOUS) ×2
SCISSORS LAP 5X35 DISP (ENDOMECHANICALS) ×2 IMPLANT
SEALER TISSUE G2 CVD JAW 35 (ENDOMECHANICALS) IMPLANT
SEALER TISSUE G2 CVD JAW 45CM (ENDOMECHANICALS)
SEPRAFILM PROCEDURAL PACK 3X5 (MISCELLANEOUS) ×1 IMPLANT
SET IRRIG TUBING LAPAROSCOPIC (IRRIGATION / IRRIGATOR) ×2 IMPLANT
SOLUTION ANTI FOG 6CC (MISCELLANEOUS) ×2 IMPLANT
SPONGE GAUZE 4X4 12PLY (GAUZE/BANDAGES/DRESSINGS) ×2 IMPLANT
SPONGE LAP 18X18 X RAY DECT (DISPOSABLE) ×5 IMPLANT
STAPLER GUN LINEAR PROX 60 (STAPLE) ×1 IMPLANT
STAPLER PROXIMATE 75MM BLUE (STAPLE) ×1 IMPLANT
STAPLER VISISTAT 35W (STAPLE) ×2 IMPLANT
SUCTION POOLE TIP (SUCTIONS) ×2 IMPLANT
SUT NOVA NAB DX-16 0-1 5-0 T12 (SUTURE) ×2 IMPLANT
SUT PDS AB 1 CTX 36 (SUTURE) ×4 IMPLANT
SUT PDS AB 1 TP1 96 (SUTURE) IMPLANT
SUT PROLENE 2 0 KS (SUTURE) IMPLANT
SUT SILK 2 0 (SUTURE) ×2
SUT SILK 2 0 SH CR/8 (SUTURE) ×4 IMPLANT
SUT SILK 2-0 18XBRD TIE 12 (SUTURE) ×1 IMPLANT
SUT SILK 3 0 (SUTURE) ×2
SUT SILK 3 0 SH CR/8 (SUTURE) ×6 IMPLANT
SUT SILK 3-0 18XBRD TIE 12 (SUTURE) ×1 IMPLANT
SUT VIC AB 2-0 SH 18 (SUTURE) ×5 IMPLANT
SUT VICRYL 2 0 18  UND BR (SUTURE) ×2
SUT VICRYL 2 0 18 UND BR (SUTURE) ×2 IMPLANT
SYR 30ML LL (SYRINGE) IMPLANT
SYS LAPSCP GELPORT 120MM (MISCELLANEOUS)
SYSTEM LAPSCP GELPORT 120MM (MISCELLANEOUS) IMPLANT
TOWEL OR 17X26 10 PK STRL BLUE (TOWEL DISPOSABLE) ×4 IMPLANT
TRAY FOLEY CATH 14FRSI W/METER (CATHETERS) ×2 IMPLANT
TRAY LAP CHOLE (CUSTOM PROCEDURE TRAY) ×2 IMPLANT
TROCAR BLADELESS OPT 5 100 (ENDOMECHANICALS) ×2 IMPLANT
TROCAR XCEL 12X100 BLDLESS (ENDOMECHANICALS) IMPLANT
TROCAR XCEL NON-BLD 5MMX100MML (ENDOMECHANICALS) ×2 IMPLANT
TUBING FILTER THERMOFLATOR (ELECTROSURGICAL) ×2 IMPLANT
YANKAUER SUCT BULB TIP 10FT TU (MISCELLANEOUS) ×2 IMPLANT
YANKAUER SUCT BULB TIP NO VENT (SUCTIONS) ×2 IMPLANT

## 2013-03-06 NOTE — H&P (View-Only) (Signed)
Chief Complaint  Patient presents with  . New Evaluation    eval possible ileoectomy    HISTORY: Hunter Hardin. is a 78 y.o. 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    Workup thus far has included abd CT which showed evidence of TI enlargement.  Biopsies showed at least HGD. He denies any nausea, vomiting but has had some bloating and recent weight loss of about 10lbs.  He states that he has had trouble with constipation recently but this is getting better on the miralax.  He is currently retired and lives alone.  He can walk a mile without getting chest pain.  Of note the CT scan showed a L renal mass as well.  He is currently see Dr Mena Goes about this.     Past Medical History  Diagnosis Date  . Hypothyroidism   . Hypertension   . HLD (hyperlipidemia)   . Elevated PSA     H/O      Past Surgical History  Procedure Laterality Date  . Cholecystectomy  early 96's  . Vasectomy        Current Outpatient Prescriptions  Medication Sig Dispense Refill  . ascorbic Acid (VITAMIN C) 500 MG CPCR Take 500 mg by mouth daily.        [name] atorvastatin (LIPITOR) 10 MG tablet Take 1 tablet (10 mg total) by mouth daily.  90 tablet  3  . doxazosin (CARDURA) 8 MG tablet Take 1 tablet (8 mg total) by mouth daily.  90 tablet  3  . hydrochlorothiazide (HYDRODIURIL) 25 MG tablet Take 1 tablet (25 mg total) by mouth daily.  90 tablet  3  . Omega-3 Fatty Acids (FISH OIL) 1000 MG CAPS Take by mouth 2 (two) times daily.        . polyethylene glycol powder (GLYCOLAX/MIRALAX) powder Take 17 g by mouth daily.  255 g  3  . propranolol (INDERAL) 40 MG tablet TAKE 1 TABLET BY MOUTH TWICE DAILY  180 tablet  1  . SYNTHROID 50 MCG tablet TAKE 1 TABLET DAILY EXCEPT 1 AND 1/2 TABLETS ON SUNDAY  98 tablet  3  . verapamil (CALAN-SR) 240 MG CR tablet TAKE 1 TABLET BY MOUTH DAILY  90 tablet  1   No current facility-administered medications for this visit.      No Known  Allergies    Family History  Problem Relation Age of Onset  . Hypertension Mother   . Hyperlipidemia Mother   . Dementia Mother   . Heart disease Father     ? MI, CAD  . Diabetes Cousin   . Cancer Brother     oral/tonsil cancer 2012  . Colon cancer Neg Hx   . Prostate cancer Neg Hx   . Cancer Sister     breast cancer      History   Social History  . Marital Status: Divorced    Spouse Name: N/A    Number of Children: 2  . Years of Education: N/A   Occupational History  . Retired AF/[employer]     Retired totally from part-time [employer]   Social History Main Topics  . Smoking status: Former Smoker -- 2.00 packs/day for 30 years    Types: Cigarettes    Quit date: 12/21/1985  . Smokeless tobacco: Never Used  . Alcohol Use: No  . Drug Use: No  . Sexually Active: None   Other Topics Concern  . None  Social History Narrative   Divorced, lives alone   Walking for exercise   [employer] [phone]   2 kids, both out of town   Enjoys yard work   Daily caffeine        REVIEW OF SYSTEMS - PERTINENT POSITIVES ONLY: Review of Systems - General ROS: negative for - chills or fever Hematological and Lymphatic ROS: negative for - bleeding problems, bruising, fatigue or jaundice Respiratory ROS: no cough, shortness of breath, or wheezing Cardiovascular ROS: no chest pain or dyspnea on exertion Gastrointestinal ROS: positive for - change in bowel habits negative for - abdominal pain or nausea/vomiting Genito-Urinary ROS: no dysuria, trouble voiding, or hematuria  EXAM: Filed Vitals:   02/22/13 1316  BP: 120/70  Pulse: 63  Temp: 97.6 F (36.4 C)  Resp: 20     Gen:  No acute distress.  Well nourished and well groomed.   Neurological: Alert and oriented to person, place, and time. Coordination normal.  Head: Normocephalic and atraumatic.  Eyes: Conjunctivae are normal. Pupils are equal, round, and reactive to light. No scleral icterus.  Neck: Normal range  of motion. Neck supple. No tracheal deviation or thyromegaly present.  No cervical lymphadenopathy. Cardiovascular: Normal rate, regular rhythm, normal heart sounds and intact distal pulses.  Exam reveals no gallop and no friction rub.  No murmur heard. Respiratory: Effort normal.  No respiratory distress. No chest wall tenderness. Breath sounds normal.  No wheezes, rales or rhonchi.  GI: Soft. Bowel sounds are normal. The abdomen is soft and nontender.  There is no rebound and no guarding. Kocher scar noted, no definite hernias plapated Musculoskeletal: Normal range of motion. Extremities are nontender.  Skin: Skin is warm and dry. No rash noted. No diaphoresis. No erythema. No pallor. No clubbing, cyanosis, or edema.   Psychiatric: Normal mood and affect. Behavior is normal. Judgment and thought content normal.      LABORATORY RESULTS: Available labs are reviewed  CEA: pend  RADIOLOGY RESULTS:   Images and reports are reviewed. CT Abd IMPRESSION:  1. Mass arising from upper pole of the left kidney is worrisome for renal cell carcinoma. Urologic consultation should be considered.  2. Although no cecal mass is identified, there is increased caliber of the terminal ileum which may be partially obstructed.  Correlation with colonoscopy results advised.  3. There are two indeterminate pulmonary nodules within the left lower lobe. These are increased in attenuation and may represent granulomas, but are not densely calcified. Consider further evaluation with PET CT.  4. Small bilateral pleural effusions.  ASSESSMENT AND PLAN: Hunter Hardin. is a 78 y.o. M who was referred to me for an ileocecal valve mass.  On review of his information, biopsy results and CT scan, I am concerned that this may be a cancer and not just a polyp.  I discussed this case with Dr Mena Goes today, and he felt that the risk of separate surgeries would be less than the risk of doing them both together.  He also felt  that the malignant potential for the renal tumor was most likely low, and therefore it could wait to be addressed until after the colon surgery.  I am concerned that he may begin to have obstructive symptoms, so I would like to go ahead with a laparoscopic right colectomy.  I have referred him for medical clearance.  We will get a chest CT to follow up on his pulmonary nodules seen on abd CT and we will draw  a CEA just in case this turns out to be a cancer.      The surgery and anatomy were described to the patient as well as the risks of surgery and the possible complications. These include: Bleeding, infection and possible wound complications such as hernia, damage to adjacent structures, leak of surgical connections, which can lead to other surgeries and possibly an ostomy (5-7%), possible need for other procedures, such as abscess drains in radiology, possible prolonged hospital stay, possible diarrhea from removal of part of the colon, possible constipation from narcotics, prolonged fatigue/weakness or appetite loss, possible early recurrence of cancer, possible complications of their medical problems such as heart disease or arrhythmias or lung problems, death (less than 1%). I believe the patient understands and wishes to proceed with the surgery.    Vanita Panda, MD Colon and Rectal Surgery / General Surgery [facility]      Visit Diagnoses: 1. Colonic mass   2. 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 Pulmonary nodules   4. Nonspecific findings on examination of blood      Primary Care Physician: Crawford Givens, MD

## 2013-03-06 NOTE — Preoperative (Signed)
Beta Blockers   Reason not to administer Beta Blockers:Not Applicable pt. Took beta blocker this a.m. 

## 2013-03-06 NOTE — Op Note (Signed)
Hunter Hardin [id number]   PRE-OPERATIVE DIAGNOSIS:  Ileocecal mass  POST-OPERATIVE DIAGNOSIS:  Ileocecal mass  PROCEDURE:  Procedure(s): Laparoscopic-assisted right hemicolectomy  SURGEON:  Surgeon(s): Romie Levee, MD  ASSISTANT: Glenna Fellows, M.D.  ANESTHESIA:   local and general  EBL:   200 mL  Delay start of Pharmacological VTE agent (>24hrs) due to surgical blood loss or risk of bleeding:  no  DRAINS: none   SPECIMEN:  Source of Specimen:  right colon  DISPOSITION OF SPECIMEN:  PATHOLOGY  COUNTS:  YES  PLAN OF CARE: Admit to inpatient   PATIENT DISPOSITION:  PACU - hemodynamically stable.   INDICATIONS: Patient with concerning symptoms & work up and obstructing ileocecal mass.  Biopsies shows high-grade dysplasia.  Surgery was recommended:  Risks such as bleeding, infection, abscess, leak, reoperation, possible ostomy, hernia, heart attack, death, and other risks were discussed.  Goals of post-operative recovery were discussed as well.  We will work to minimize complications.  Questions were answered.  The patient expresses understanding & wishes to proceed with surgery.  OR FINDINGS: ileocecal mass  DESCRIPTION:   The patient was identified & brought into the operating room. The patient was positioned supine with left arm tucked. SCDs were active during the entire case. The patient underwent general anesthesia without any difficulty.  A foley catheter was inserted under sterile conditions. The abdomen was prepped and draped in a sterile fashion. A Surgical Timeout confirmed our plan.   I made a vertical incision through the superior umbilical fold.  I made a nick in the infraumbilical fascia and confirmed peritoneal entry.  I placed a stay suture and then the [facility] port.  We induced carbon dioxide insufflation.  Camera inspection revealed no injury.  I placed additional ports under direct laparoscopic visualization in the left lower quadrant and  suprapubic regions.  I mobilized the terminal ileum to proximal ascending colon in a lateral to medial fashion.  I took care to avoid injuring any retroperitoneal structures.  I mobilized to the level of the hepatic flexure. The colon was adherent to his gallbladder fossa do to his previous cholecystectomy. There was also several loops of small bowel that were adherent to the abdominal wall. These were all taken down using sharp dissection the terminal ileum was adhesed to the hepatic flexure. I was able to mobilize some of this but I was unable to visualize things clearly enough to continue laparoscopically. At this point I decided to convert to an open operation. My incision was extended superiorly by several centimeters.  A Balfour wound retractor was placed into the abdomen.  I was then able to separate the omentum from the colon and dissected this away from the gallbladder fossa. The colon was mobilized towards the midline. After this was completed a GIA stapler was used to transect the terminal ileum the mesentery was transected using the Enseal device. I then identified the ileocolic vessels. These were clamped and high ligation and suture closed with a 2-0 silk suture. After this was completed I turned my attention to the transverse colon. I found a portion of the transverse colon that appeared in good condition with a little adhesions. A small amount of terminal ileum was mobilized away from this using Metzenbaum scissors. A GIA stapler was used to transect the transverse colon. The remaining mesentery was dissected. The right colic vessel was transected using cut and clamp technique. Once the entire mesentery was freed the specimen was passed off the table. Of note I  did dissect the specimen on the back table and there was a hard mass in the ileocecal valve creating a stricture in the area. At this time there was some bleeding in the right upper quadrant. This was packed with 2 lap sponges. The terminal  ileum was evaluated. The stapled and appeared mildly ischemic. Several more centimeters of the terminal ileum were taken using a GIA blue load stapler. I then used a 3-0 silk suture to connect the end of the terminal ileum to the transverse colon. An enterotomy was made in both segments of bowel and a 75 mm GIA blue load stapler was inserted to create the common channel. Inspection of the staple line showed no intraluminal bleeding. A TA stapler was used to close the common enterotomy site. 3-0 silk sutures were used in the crotch of the anastomosis to prevent any tension. Another 3-0 silk suture was used to imbricate the corner of the staple line. Hemostasis was good. The anastomosis was placed back into the abdomen. The retractor was removed and we regowned and gloved at this time.  The abdomen was irrigated with 2 L of normal saline. The right upper quadrant was inspected. Hemostasis was good. The omentum was then brought over the operative field and Seprafilm was placed over this. The fascia was then closed using interrupted oh Novafil sutures. The subcutaneous tissue was closed using interrupted 2-0 Vicryl sutures. The skin was closed using staples. The 2 port sites were closed using interrupted 2-0 Vicryl sutures. A sterile dressing was applied. All counts were correct per operating room staff. The patient was awakened from anesthesia and sent to the post anesthesia care unit in stable condition.

## 2013-03-06 NOTE — Anesthesia Preprocedure Evaluation (Addendum)
Anesthesia Evaluation  Patient identified by MRN, date of birth, ID band Patient awake    Reviewed: Allergy & Precautions, H&P , NPO status , Patient's Chart, lab work & pertinent test results  Airway Mallampati: II TM Distance: >3 FB Neck ROM: Full    Dental  (+) Dental Advisory Given and Teeth Intact   Pulmonary neg pulmonary ROS,  breath sounds clear to auscultation        Cardiovascular hypertension, Pt. on medications negative cardio ROS  Rhythm:Regular Rate:Normal     Neuro/Psych negative neurological ROS  negative psych ROS   GI/Hepatic Neg liver ROS, GERD-  Medicated,  Endo/Other  Hypothyroidism   Renal/GU negative Renal ROS     Musculoskeletal negative musculoskeletal ROS (+)   Abdominal   Peds  Hematology negative hematology ROS (+)   Anesthesia Other Findings   Reproductive/Obstetrics                         Anesthesia Physical Anesthesia Plan  ASA: II  Anesthesia Plan: General   Post-op Pain Management:    Induction: Intravenous  Airway Management Planned: Oral ETT  Additional Equipment:   Intra-op Plan:   Post-operative Plan: Extubation in OR  Informed Consent: I have reviewed the patients History and Physical, chart, labs and discussed the procedure including the risks, benefits and alternatives for the proposed anesthesia with the patient or authorized representative who has indicated his/her understanding and acceptance.   Dental advisory given  Plan Discussed with: CRNA  Anesthesia Plan Comments:         Anesthesia Quick Evaluation

## 2013-03-06 NOTE — Transfer of Care (Signed)
Immediate Anesthesia Transfer of Care Note  Patient: Hunter Hardin.  Procedure(s) Performed: Procedure(s): LAPAROSCOPic assisted right hemi COLECTOMY (N/A)  Patient Location: PACU  Anesthesia Type:General  Level of Consciousness: awake, oriented, patient cooperative, lethargic and responds to stimulation  Airway & Oxygen Therapy: Patient Spontanous Breathing and Patient connected to face mask oxygen  Post-op Assessment: Report given to PACU RN, Post -op Vital signs reviewed and stable and Patient moving all extremities  Post vital signs: Reviewed and stable  Complications: No apparent anesthesia complications

## 2013-03-06 NOTE — Anesthesia Procedure Notes (Signed)
Procedure Name: Intubation Date/Time: 03/06/2013 9:35 AM Performed by: Phillips Grout E Patient Re-evaluated:Patient Re-evaluated prior to inductionOxygen Delivery Method: Circle system utilized Preoxygenation: Pre-oxygenation with 100% oxygen Intubation Type: IV induction Ventilation: Mask ventilation without difficulty Laryngoscope Size: Mac and 4 Grade View: Grade II Tube type: Oral Tube size: 7.5 mm Number of attempts: 1 Airway Equipment and Method: Stylet Secured at: 21 cm Tube secured with: Tape

## 2013-03-06 NOTE — Interval H&P Note (Signed)
History and Physical Interval Note:  03/06/2013 8:54 AM  Hunter Hardin.  has presented today for surgery, with the diagnosis of cecal polyp  The various methods of treatment have been discussed with the patient and family. After consideration of risks, benefits and other options for treatment, the patient has consented to  Procedure(s): LAPAROSCOPIC Segmental COLECTOMY (N/A) as a surgical intervention .  The patient's history has been reviewed, patient examined, no change in status, stable for surgery.  I have reviewed the patient's chart and labs.  Questions were answered to the patient's satisfaction.   The patient does have findings concerning for metastatic RCC.  Given that this polyp appears to be impending on obstruction of the terminal ileum, it was decided that we should go ahead with surgery resect this mass first.  I will try to get a CT biopsy of the lung lesion during his inpatient stay.  Vanita Panda, MD  Colorectal and General Surgery [facility]

## 2013-03-06 NOTE — Anesthesia Postprocedure Evaluation (Signed)
Anesthesia Post Note  Patient: Hunter Hardin.  Procedure(s) Performed: Procedure(s) (LRB): LAPAROSCOPic assisted right hemi COLECTOMY (N/A)  Anesthesia type: General  Patient location: PACU  Post pain: Pain level controlled  Post assessment: Post-op Vital signs reviewed  Last Vitals: BP 127/73  Pulse 51  Temp(Src) 36.4 C (Oral)  Resp 12  Ht 5\' 7"  (1.702 m)  Wt 197 lb (89.359 kg)  BMI 30.85 kg/m2  SpO2 96%  Post vital signs: Reviewed  Level of consciousness: sedated  Complications: No apparent anesthesia complications

## 2013-03-07 LAB — BASIC METABOLIC PANEL
BUN: 17 mg/dL (ref 6–23)
Calcium: 8.2 mg/dL — ABNORMAL LOW (ref 8.4–10.5)
Creatinine, Ser: 1.11 mg/dL (ref 0.50–1.35)
GFR calc Af Amer: 73 mL/min — ABNORMAL LOW (ref >90)
GFR calc non Af Amer: 63 mL/min — ABNORMAL LOW (ref >90)

## 2013-03-07 LAB — CBC
MCH: 29.3 pg (ref 26.0–34.0)
MCHC: 34.7 g/dL (ref 30.0–36.0)
RDW: 13.5 % (ref 11.5–15.5)

## 2013-03-07 MED ORDER — CYCLOBENZAPRINE HCL 5 MG PO TABS
5.0000 mg | ORAL_TABLET | Freq: Three times a day (TID) | ORAL | Status: DC | PRN
  Administered 2013-03-07 – 2013-03-08 (×3): 5 mg via ORAL
  Filled 2013-03-07 (×3): qty 1

## 2013-03-07 NOTE — Care Management Note (Signed)
    Page 1 of 1   03/07/2013     12:56:00 PM   CARE MANAGEMENT NOTE 03/07/2013  Patient:  Hunter Hardin, Hunter Hardin   Account Number:  [id number]  Date Initiated:  03/07/2013  Documentation initiated by:  Lorenda Ishihara  Subjective/Objective Assessment:   78 yo male admitted s/p lap colectomy. PTA lived at home alone.     Action/Plan:   Home when stable   Anticipated DC Date:  03/10/2013   Anticipated DC Plan:  HOME/SELF CARE      DC Planning Services  CM consult      Choice offered to / List presented to:             Status of service:  Completed, signed off Medicare Important Message given?   (If response is "NO", the following Medicare IM given date fields will be blank) Date Medicare IM given:   Date Additional Medicare IM given:    Discharge Disposition:  HOME/SELF CARE  Per UR Regulation:  Reviewed for med. necessity/level of care/duration of stay  If discussed at Long Length of Stay Meetings, dates discussed:    Comments:

## 2013-03-07 NOTE — Evaluation (Signed)
Physical Therapy Evaluation Patient Details Name: Hunter Hardin. MRN: [id number] DOB: 01-28-1935 Today's Date: 03/07/2013 Time: 7829-5621 PT Time Calculation (min): 24 min  PT Assessment / Plan / Recommendation Clinical Impression  Pt s/p hemicolectomy presents with post op pain  and muscle spasms limiting functional mobility    PT Assessment  Patient needs continued PT services    Follow Up Recommendations  No PT follow up    Does the patient have the potential to tolerate intense rehabilitation      Barriers to Discharge None      Equipment Recommendations  None recommended by PT    Recommendations for Other Services     Frequency Min 3X/week    Precautions / Restrictions Precautions Precautions: Fall Restrictions Weight Bearing Restrictions: No   Pertinent Vitals/Pain 1/10 at rest; increased with muscle spasms and mobility; PCA utilized      Mobility  Bed Mobility Bed Mobility: Supine to Sit;Rolling Right;Right Sidelying to Sit Rolling Right: 4: Min assist Right Sidelying to Sit: 4: Min assist Supine to Sit: 4: Min assist Details for Bed Mobility Assistance: cues for correct log roll technique Transfers Transfers: Sit to Stand;Stand to Sit Sit to Stand: 4: Min assist Stand to Sit: 4: Min assist Details for Transfer Assistance: cues for use of UEs to self assist Ambulation/Gait Ambulation/Gait Assistance: 4: Min guard;4: Min Environmental consultant (Feet): 450 Feet Assistive device: Rolling walker Ambulation/Gait Assistance Details: cues for posture and position from RW.  Attempted amb without RW at pt request with obvious increase in instability - pt recognizes same Gait Pattern: Step-through pattern    Exercises     PT Diagnosis: Difficulty walking  PT Problem List: Decreased activity tolerance;Decreased balance;Decreased mobility;Decreased knowledge of use of DME;Pain PT Treatment Interventions: DME instruction;Gait training;Stair  training;Functional mobility training;Therapeutic activities;Patient/family education   PT Goals Acute Rehab PT Goals PT Goal Formulation: With patient Time For Goal Achievement: 03/15/13 Potential to Achieve Goals: Good Pt will go Supine/Side to Sit: with supervision PT Goal: Supine/Side to Sit - Progress: Goal set today Pt will go Sit to Supine/Side: with supervision PT Goal: Sit to Supine/Side - Progress: Goal set today Pt will go Sit to Stand: with supervision PT Goal: Sit to Stand - Progress: Goal set today Pt will go Stand to Sit: with supervision PT Goal: Stand to Sit - Progress: Goal set today Pt will Ambulate: >150 feet;with supervision;with least restrictive assistive device PT Goal: Ambulate - Progress: Goal set today Pt will Go Up / Down Stairs: 1-2 stairs;with supervision;with least restrictive assistive device PT Goal: Up/Down Stairs - Progress: Goal set today  Visit Information  Last PT Received On: 03/07/13 Assistance Needed: +1    Subjective Data  Subjective: I'm having muscle spasms in my belly Patient Stated Goal: Resume previous lifestyle asap   Prior Functioning  Home Living Lives With: Alone Available Help at Discharge: Family (sister will stay temporarily) Type of Home: House Home Access: Stairs to enter [employer] of Steps: 2 Entrance Stairs-Rails: Right;Left;Can reach both Home Layout: One level Home Adaptive Equipment: Walker - rolling Additional Comments: Pt states sister is getting RW from attic. 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;HOH    Copywriter, advertising Overall Cognitive Status: Appears within functional limits for tasks assessed/performed Arousal/Alertness: Awake/alert Orientation Level: Appears intact for tasks assessed Behavior During Session: [facility] for tasks performed    Extremity/Trunk Assessment  Right Upper Extremity  Assessment RUE ROM/Strength/Tone: [facility] for tasks assessed Left Upper Extremity Assessment LUE ROM/Strength/Tone: [facility] for tasks assessed Right Lower Extremity Assessment RLE ROM/Strength/Tone: [facility] for tasks assessed Left Lower Extremity Assessment LLE ROM/Strength/Tone: [facility] for tasks assessed   Balance    End of Session PT - End of Session Activity Tolerance: Patient tolerated treatment well Patient left: in chair;with call bell/phone within reach Nurse Communication: Mobility status  GP     Jony Ladnier 03/07/2013, 12:36 PM

## 2013-03-07 NOTE — Progress Notes (Signed)
1 Day Post-Op Lap assisted R colectomy Subjective: Patient denies nausea, pain controlled but reluctant to push PCA button.  Having some muscle spasms at incision site  Objective: Vital signs in last 24 hours: Temp:  [97.2 F (36.2 C)-98.7 F (37.1 C)] 98.1 F (36.7 C) (03/18 0612) Pulse Rate:  [51-73] 68 (03/18 0612) Resp:  [8-18] 15 (03/18 0734) BP: (103-140)/(58-73) 103/61 mmHg (03/18 0612) SpO2:  [96 %-100 %] 96 % (03/18 0734) Weight:  [197 lb (89.359 kg)] 197 lb (89.359 kg) (03/17 1522)   Intake/Output from previous day: 03/17 0701 - 03/18 0700 In: 4281.7 [I.V.:4281.7] Out: 2065 [Urine:1640; Blood:325] Intake/Output this shift:   General appearance: alert, cooperative and no distress Cardio: regular rate and rhythm GI: normal findings: soft, appropriately tender  Incision: small amt of bloody drainage on dressing  Lab Results:   Recent Labs  03/07/13 0418  WBC 13.4*  HGB 10.8*  HCT 31.1*  PLT 159   BMET  Recent Labs  03/07/13 0418  NA 136  K 3.5  CL 101  CO2 25  GLUCOSE 153*  BUN 17  CREATININE 1.11  CALCIUM 8.2*   MEDS, Scheduled . alvimopan  12 mg Oral BID  . atorvastatin  10 mg Oral q1800  . doxazosin  8 mg Oral Daily  . heparin subcutaneous  5,000 Units Subcutaneous Q8H  . hydrochlorothiazide  25 mg Oral Daily  . levothyroxine  50 mcg Oral Custom  . [START ON 03/12/2013] levothyroxine  75 mcg Oral Custom  . morphine   Intravenous Q4H  . propranolol  40 mg Oral BID  . verapamil  240 mg Oral QHS    Studies/Results: No results found.  Assessment: s/p Procedure(s): LAPAROSCOPic assisted right hemi COLECTOMY Patient Active Problem List  Diagnosis  . HYPOTHYROIDISM  . HYPERLIPIDEMIA  . HYPERTENSION  . HYPERGLYCEMIA  . ELEVATED PROSTATE SPECIFIC ANTIGEN  . Medicare annual wellness visit, initial  . Preoperative evaluation to rule out surgical contraindication    Expected Post Op Course  Plan: d/c foley Advance diet to  clears decrease IVF's to 35ml/h OOB and ambulate today Flexeril to help with muscle spasms at inc site   LOS: 1 day     .Vanita Panda, MD [facility], [place] [phone]   03/07/2013 8:18 AM

## 2013-03-08 ENCOUNTER — Encounter (HOSPITAL_COMMUNITY): Payer: Self-pay | Admitting: General Surgery

## 2013-03-08 LAB — BASIC METABOLIC PANEL
Calcium: 8.7 mg/dL (ref 8.4–10.5)
GFR calc Af Amer: 76 mL/min — ABNORMAL LOW (ref >90)
GFR calc non Af Amer: 66 mL/min — ABNORMAL LOW (ref >90)
Potassium: 3.4 mEq/L — ABNORMAL LOW (ref 3.5–5.1)
Sodium: 134 mEq/L — ABNORMAL LOW (ref 135–145)

## 2013-03-08 LAB — CBC
Hemoglobin: 10.5 g/dL — ABNORMAL LOW (ref 13.0–17.0)
MCHC: 34.9 g/dL (ref 30.0–36.0)
Platelets: 144 10*3/uL — ABNORMAL LOW (ref 150–400)
RBC: 3.52 MIL/uL — ABNORMAL LOW (ref 4.22–5.81)

## 2013-03-08 NOTE — Progress Notes (Signed)
Physical Therapy Treatment Patient Details Name: Hunter Hardin. MRN: [id number] DOB: 1935-07-27 Today's Date: 03/08/2013 Time: 8295-6213 PT Time Calculation (min): 23 min  PT Assessment / Plan / Recommendation Comments on Treatment Session  Progressing with mobility. Most difficulty is with bed mobility. Pt states he has access to walker (borrowed) at home    Follow Up Recommendations  No PT follow up     Does the patient have the potential to tolerate intense rehabilitation     Barriers to Discharge        Equipment Recommendations  None recommended by PT    Recommendations for Other Services    Frequency Min 3X/week   Plan      Precautions / Restrictions Precautions Precautions: Fall Restrictions Weight Bearing Restrictions: No   Pertinent Vitals/Pain Muscle spasms abdomen-intermittent-8/10    Mobility  Bed Mobility Bed Mobility: Supine to Sit Supine to Sit: 4: Min assist;HOB elevated;With rails Details for Bed Mobility Assistance: Assist for trunk to upright.  Transfers Transfers: Sit to Stand;Stand to Sit Sit to Stand: 4: Min assist;From bed;With upper extremity assist Stand to Sit: 4: Min assist;To chair/3-in-1;With upper extremity assist Details for Transfer Assistance: Assist to rise, steady. VCS safety, hand placment Ambulation/Gait Ambulation/Gait Assistance: 4: Min guard Ambulation Distance (Feet): 500 Feet Assistive device: Rolling walker Gait Pattern: Step-through pattern    Exercises     PT Diagnosis:    PT Problem List:   PT Treatment Interventions:     PT Goals Acute Rehab PT Goals Pt will go Supine/Side to Sit: with supervision PT Goal: Supine/Side to Sit - Progress: Progressing toward goal Pt will go Sit to Stand: with supervision PT Goal: Sit to Stand - Progress: Progressing toward goal Pt will go Stand to Sit: with supervision PT Goal: Stand to Sit - Progress: Progressing toward goal Pt will Ambulate: >150 feet;with  supervision;with least restrictive assistive device PT Goal: Ambulate - Progress: Progressing toward goal  Visit Information  Last PT Received On: 03/08/13 Assistance Needed: +1    Subjective Data  Subjective: im still having muscle spasms Patient Stated Goal: Resume previous lifestyle asap   Cognition  Cognition Overall Cognitive Status: Appears within functional limits for tasks assessed/performed Arousal/Alertness: Awake/alert Orientation Level: Appears intact for tasks assessed Behavior During Session: [facility] for tasks performed    Balance     End of Session PT - End of Session Activity Tolerance: Patient tolerated treatment well Patient left: in chair;with call bell/phone within reach   GP     Rebeca Alert, MPT Pager: [phone]

## 2013-03-08 NOTE — Progress Notes (Signed)
2 Days Post-Op Lap assisted R colectomy Subjective: Patient denies nausea, pain controlled but reluctant to push PCA button.   muscle spasms at incision site better.  Still feeling a bit bloated.  Urinating well with foley out.  Objective: Vital signs in last 24 hours: Temp:  [97.9 F (36.6 C)-98.1 F (36.7 C)] 97.9 F (36.6 C) (03/19 1400) Pulse Rate:  [64-71] 64 (03/19 1400) Resp:  [16-22] 18 (03/19 1400) BP: (125-147)/(60-71) 125/60 mmHg (03/19 1400) SpO2:  [92 %-99 %] 96 % (03/19 1400)   Intake/Output from previous day: 03/18 0701 - 03/19 0700 In: 1366.7 [I.V.:1366.7] Out: 2050 [Urine:2050] Intake/Output this shift: Total I/O In: 456.7 [P.O.:240; I.V.:216.7] Out: 475 [Urine:475] General appearance: alert, cooperative and no distress Cardio: regular rate and rhythm GI: normal findings: soft, appropriately tender  Incision: small amt of bloody drainage on dressing  Lab Results:   Recent Labs  03/07/13 0418 03/08/13 0436  WBC 13.4* 13.3*  HGB 10.8* 10.5*  HCT 31.1* 30.1*  PLT 159 144*   BMET  Recent Labs  03/07/13 0418 03/08/13 0436  NA 136 134*  K 3.5 3.4*  CL 101 99  CO2 25 26  GLUCOSE 153* 112*  BUN 17 15  CREATININE 1.11 1.07  CALCIUM 8.2* 8.7   MEDS, Scheduled . alvimopan  12 mg Oral BID  . atorvastatin  10 mg Oral q1800  . doxazosin  8 mg Oral Daily  . heparin subcutaneous  5,000 Units Subcutaneous Q8H  . hydrochlorothiazide  25 mg Oral Daily  . levothyroxine  50 mcg Oral Custom  . [START ON 03/12/2013] levothyroxine  75 mcg Oral Custom  . morphine   Intravenous Q4H  . propranolol  40 mg Oral BID  . verapamil  240 mg Oral QHS    Studies/Results: No results found.  Assessment: s/p Procedure(s): LAPAROSCOPic assisted right hemi COLECTOMY Patient Active Problem List  Diagnosis  . HYPOTHYROIDISM  . HYPERLIPIDEMIA  . HYPERTENSION  . HYPERGLYCEMIA  . ELEVATED PROSTATE SPECIFIC ANTIGEN  . Medicare annual wellness visit, initial  .  Preoperative evaluation to rule out surgical contraindication  . Benign neoplasm of ileocecal valve    Expected Post Op Course  Plan: Cont clears decrease IVF's to 43ml/h OOB and ambulate  Cont Flexeril to help with muscle spasms at inc site Cont PCA   LOS: 2 days     .Vanita Panda, MD [facility], [place] [phone]   03/08/2013 2:22 PM

## 2013-03-08 NOTE — Progress Notes (Signed)
Dr Maisie Fus notified pt bp 107/53 with heart rate of 61.  Discussed patient cardiac meds at hs.  Instructed by MD to hold Verapamil and give Propranolol.  No other orders at this time

## 2013-03-09 LAB — BASIC METABOLIC PANEL
Calcium: 8.8 mg/dL (ref 8.4–10.5)
GFR calc non Af Amer: 37 mL/min — ABNORMAL LOW (ref >90)
Glucose, Bld: 97 mg/dL (ref 70–99)
Potassium: 3.8 mEq/L (ref 3.5–5.1)
Sodium: 133 mEq/L — ABNORMAL LOW (ref 135–145)

## 2013-03-09 LAB — CBC
Hemoglobin: 10.1 g/dL — ABNORMAL LOW (ref 13.0–17.0)
MCH: 29.5 pg (ref 26.0–34.0)
Platelets: 146 10*3/uL — ABNORMAL LOW (ref 150–400)
RBC: 3.42 MIL/uL — ABNORMAL LOW (ref 4.22–5.81)
WBC: 11.9 10*3/uL — ABNORMAL HIGH (ref 4.0–10.5)

## 2013-03-09 MED ORDER — TAMSULOSIN HCL 0.4 MG PO CAPS
0.4000 mg | ORAL_CAPSULE | Freq: Every day | ORAL | Status: DC
  Administered 2013-03-09 – 2013-03-13 (×5): 0.4 mg via ORAL
  Filled 2013-03-09 (×5): qty 1

## 2013-03-09 NOTE — Progress Notes (Signed)
Patient had concern of the urge to void but when he voided minimal urine came out. Bladder scanned patient noted to be in bladder. Paged physician on call Dr Andrey Campanile. Currently no orders to in and out cath patient. Awaiting call back. Karima Carrell RN

## 2013-03-09 NOTE — Progress Notes (Signed)
3 Days Post-Op Lap assisted R colectomy Subjective: Patient denies nausea, but is feeling bloated and is having hiccups.  pain controlled with pca.   muscle spasms at incision site better.  Urinating well.  uop adequate.  Objective: Vital signs in last 24 hours: Temp:  [97.9 F (36.6 C)-98.5 F (36.9 C)] 98.2 F (36.8 C) (03/20 0519) Pulse Rate:  [61-66] 66 (03/20 0519) Resp:  [13-21] 15 (03/20 0519) BP: (107-125)/(53-69) 118/69 mmHg (03/20 0519) SpO2:  [92 %-96 %] 94 % (03/20 0519)   Intake/Output from previous day: 03/19 0701 - 03/20 0700 In: 1258.7 [P.O.:480; I.V.:778.7] Out: 1225 [Urine:1225] Intake/Output this shift:   General appearance: alert, cooperative and no distress Cardio: regular rate and rhythm GI: normal findings: soft, appropriately tender, distended  Incision: small amt of bloody drainage on dressing  Lab Results:   Recent Labs  03/08/13 0436 03/09/13 0512  WBC 13.3* 11.9*  HGB 10.5* 10.1*  HCT 30.1* 29.2*  PLT 144* 146*   BMET  Recent Labs  03/08/13 0436 03/09/13 0512  NA 134* 133*  K 3.4* 3.8  CL 99 98  CO2 26 23  GLUCOSE 112* 97  BUN 15 19  CREATININE 1.07 1.71*  CALCIUM 8.7 8.8   MEDS, Scheduled . alvimopan  12 mg Oral BID  . atorvastatin  10 mg Oral q1800  . doxazosin  8 mg Oral Daily  . heparin subcutaneous  5,000 Units Subcutaneous Q8H  . hydrochlorothiazide  25 mg Oral Daily  . levothyroxine  50 mcg Oral Custom  . [START ON 03/12/2013] levothyroxine  75 mcg Oral Custom  . morphine   Intravenous Q4H  . propranolol  40 mg Oral BID  . verapamil  240 mg Oral QHS    Studies/Results: No results found.  Assessment: s/p Procedure(s): LAPAROSCOPic assisted right hemi COLECTOMY Patient Active Problem List  Diagnosis  . HYPOTHYROIDISM  . HYPERLIPIDEMIA  . HYPERTENSION  . HYPERGLYCEMIA  . ELEVATED PROSTATE SPECIFIC ANTIGEN  . Medicare annual wellness visit, initial  . Preoperative evaluation to rule out surgical  contraindication  . 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

## 2013-03-09 NOTE — Progress Notes (Signed)
Received patient 0030. Myra Weng RN

## 2013-03-09 NOTE — Progress Notes (Signed)
Physical Therapy Treatment Patient Details Name: Hunter Hardin. MRN: [id number] DOB: May 28, 1935 Today's Date: 03/09/2013 Time: 4098-1191 PT Time Calculation (min): 24 min  PT Assessment / Plan / Recommendation Comments on Treatment Session       Follow Up Recommendations  No PT follow up     Does the patient have the potential to tolerate intense rehabilitation     Barriers to Discharge        Equipment Recommendations  None recommended by PT    Recommendations for Other Services    Frequency Min 3X/week   Plan      Precautions / Restrictions Precautions Precautions: Fall Restrictions Weight Bearing Restrictions: No   Pertinent Vitals/Pain Pt denies pain    Mobility  Bed Mobility Bed Mobility: Sit to Sidelying Right Sit to Sidelying Right: 4: Min assist Details for Bed Mobility Assistance: assist for LEs onto bed Transfers Transfers: Sit to Stand;Stand to Sit Sit to Stand: 4: Min assist;From bed;From toilet Stand to Sit: 4: Min assist;To bed;To toilet Details for Transfer Assistance: Assist to rise, steady, control descent. VCS safety, hand placment Ambulation/Gait Ambulation/Gait Assistance: 4: Min guard Ambulation Distance (Feet): 500 Feet Assistive device: Rolling walker Ambulation/Gait Assistance Details: Several standing rest breaks to allow O2 sats return to >90%. Pt denied SOB. O2 sats 83% on RA at lowest Gait Pattern: Step-through pattern;Decreased stride length    Exercises     PT Diagnosis:    PT Problem List:   PT Treatment Interventions:     PT Goals Acute Rehab PT Goals Pt will go Sit to Supine/Side: with supervision PT Goal: Sit to Supine/Side - Progress: Progressing toward goal Pt will go Sit to Stand: with supervision PT Goal: Sit to Stand - Progress: Progressing toward goal Pt will go Stand to Sit: with supervision PT Goal: Stand to Sit - Progress: Progressing toward goal Pt will Ambulate: >150 feet;with supervision;with least  restrictive assistive device PT Goal: Ambulate - Progress: Progressing toward goal  Visit Information  Last PT Received On: 03/09/13 Assistance Needed: +1    Subjective Data  Subjective: The spasms are getting better Patient Stated Goal: Resume previous lifestyle asap   Cognition  Cognition Overall Cognitive Status: Appears within functional limits for tasks assessed/performed Arousal/Alertness: Awake/alert Orientation Level: Appears intact for tasks assessed Behavior During Session: [facility] for tasks performed    Balance     End of Session PT - End of Session Activity Tolerance: Patient tolerated treatment well Patient left: in bed;with call bell/phone within reach   GP     Rebeca Alert, MPT Pager: [phone]

## 2013-03-10 MED ORDER — SODIUM CHLORIDE 0.9 % IV BOLUS (SEPSIS)
500.0000 mL | Freq: Once | INTRAVENOUS | Status: AC
  Administered 2013-03-10: 500 mL via INTRAVENOUS

## 2013-03-10 NOTE — Progress Notes (Signed)
Orders received from MD Hoxworth to give patient 500 ml bolus of NS and hold BP medication for tonight Stanford Breed RN 03-10-2013 14:19pm

## 2013-03-10 NOTE — Progress Notes (Signed)
PT Cancellation Note  Patient Details Name: Hunter Hardin. MRN: [id number] DOB: [birth year]   Cancelled Treatment:   Attempted PT tx session-low BP per RN. Will hold PT at this time. Will check back another day. Thanks.    Rebeca Alert [facility] 03/10/2013, 3:06 PM

## 2013-03-10 NOTE — Progress Notes (Signed)
Patient ID: Hunter Melena., male   DOB: 09/03/1935, 78 y.o.   MRN: [id number] 4 Days Post-Op  Subjective: No C/O this AM.  Foley placed last night for urinary retention.  No nausea, no flatus or BM yet  Objective: Vital signs in last 24 hours: Temp:  [97.9 F (36.6 C)-98.1 F (36.7 C)] 98.1 F (36.7 C) (03/21 0539) Pulse Rate:  [58-63] 58 (03/21 0539) Resp:  [12-20] 14 (03/21 0539) BP: (99-117)/(32-71) 99/32 mmHg (03/21 0539) SpO2:  [92 %-98 %] 97 % (03/21 0539) Last BM Date: 03/06/13  Intake/Output from previous day: 03/20 0701 - 03/21 0700 In: 1699.6 [I.V.:1699.6] Out: 2400 [Urine:2400] Intake/Output this shift:    General appearance: alert, cooperative and no distress Resp: clear to auscultation bilaterally GI: normal findings: soft, non-tender and not distended Incision/Wound: No reddness or drainage  Lab Results:   Recent Labs  03/08/13 0436 03/09/13 0512  WBC 13.3* 11.9*  HGB 10.5* 10.1*  HCT 30.1* 29.2*  PLT 144* 146*   BMET  Recent Labs  03/08/13 0436 03/09/13 0512  NA 134* 133*  K 3.4* 3.8  CL 99 98  CO2 26 23  GLUCOSE 112* 97  BUN 15 19  CREATININE 1.07 1.71*  CALCIUM 8.7 8.8     Studies/Results: No results found.  Anti-infectives: Anti-infectives   Start     Dose/Rate Route Frequency Ordered Stop   03/06/13 2000  cefoTEtan (CEFOTAN) 2 g in dextrose 5 % 50 mL IVPB     2 g 100 mL/hr over 30 Minutes Intravenous Every 12 hours 03/06/13 1415 03/06/13 2020   03/06/13 0647  cefoTEtan (CEFOTAN) 2 g in dextrose 5 % 50 mL IVPB     2 g 100 mL/hr over 30 Minutes Intravenous On call to O.R. 03/06/13 4098 03/06/13 0915      Assessment/Plan: s/p Procedure(s): LAPAROSCOPic assisted right hemi COLECTOMY Stable.  Will try CL again Urinary retention, Foley in for now and on Flomax Creat up slightly yesterday, poss secondary to above.  Check lab in AM   LOS: 4 days    Atisha Hamidi T 03/10/2013

## 2013-03-10 NOTE — Progress Notes (Signed)
Paged MD on call regarding patient's manual blood pressure is 88/56 sitting, heart rate 58. Awaiting callback Stanford Breed RN 03-10-2012 13:49pm

## 2013-03-11 LAB — BASIC METABOLIC PANEL
BUN: 15 mg/dL (ref 6–23)
CO2: 26 mEq/L (ref 19–32)
Chloride: 100 mEq/L (ref 96–112)
Creatinine, Ser: 1.13 mg/dL (ref 0.50–1.35)
GFR calc Af Amer: 71 mL/min — ABNORMAL LOW (ref >90)
Glucose, Bld: 97 mg/dL (ref 70–99)
Potassium: 3.6 mEq/L (ref 3.5–5.1)

## 2013-03-11 LAB — CBC
HCT: 27.9 % — ABNORMAL LOW (ref 39.0–52.0)
Hemoglobin: 9.7 g/dL — ABNORMAL LOW (ref 13.0–17.0)
MCV: 85.1 fL (ref 78.0–100.0)
RBC: 3.28 MIL/uL — ABNORMAL LOW (ref 4.22–5.81)
RDW: 14.1 % (ref 11.5–15.5)
WBC: 7.5 10*3/uL (ref 4.0–10.5)

## 2013-03-11 MED ORDER — MORPHINE SULFATE 2 MG/ML IJ SOLN: 2.0000 mg | INTRAMUSCULAR | Status: DC | PRN

## 2013-03-11 NOTE — Progress Notes (Addendum)
5 Days Post-Op  Subjective: Stable and alert. Vomited a little bit yesterday but feels much better today, had a bowel movement, and tolerating clear liquids. Denies nausea now. Foley catheter was reinserted yesterday. Excellent urine output.  Lab work shows creatinine back down to 1.13, chemistries normal, hemoglobin 9.7, WBC 7500. This all looks pretty good.  He is aware of his pathology report. I briefly discussed this with him again.  Objective: Vital signs in last 24 hours: Temp:  [97.6 F (36.4 C)-98.2 F (36.8 C)] 97.6 F (36.4 C) (03/22 0525) Pulse Rate:  [53-67] 67 (03/22 0929) Resp:  [12-18] 16 (03/22 0929) BP: (87-111)/(54-74) 105/65 mmHg (03/22 0929) SpO2:  [92 %-99 %] 99 % (03/22 0929) Last BM Date: 03/10/13 (per pt)  Intake/Output from previous day: 03/21 0701 - 03/22 0700 In: 1808.8 [I.V.:1808.8] Out: 2650 [Urine:2650] Intake/Output this shift:    General appearance: Alert. Cooperative. Mental status normal. No distress. GI: abdomen soft. Hypoactive bowel sounds. Not distended, not tympanitic. Wound looks good.  Lab Results:  Results for orders placed during the hospital encounter of 03/06/13 (from the past 24 hour(s))  CBC     Status: Abnormal   Collection Time    03/11/13  5:45 AM      Result Value Range   WBC 7.5  4.0 - 10.5 K/uL   RBC 3.28 (*) 4.22 - 5.81 MIL/uL   Hemoglobin 9.7 (*) 13.0 - 17.0 g/dL   HCT 16.1 (*) 09.6 - 04.5 %   MCV 85.1  78.0 - 100.0 fL   MCH 29.6  26.0 - 34.0 pg   MCHC 34.8  30.0 - 36.0 g/dL   RDW 40.9  81.1 - 91.4 %   Platelets 149 (*) 150 - 400 K/uL  BASIC METABOLIC PANEL     Status: Abnormal   Collection Time    03/11/13  5:45 AM      Result Value Range   Sodium 134 (*) 135 - 145 mEq/L   Potassium 3.6  3.5 - 5.1 mEq/L   Chloride 100  96 - 112 mEq/L   CO2 26  19 - 32 mEq/L   Glucose, Bld 97  70 - 99 mg/dL   BUN 15  6 - 23 mg/dL   Creatinine, Ser 7.82  0.50 - 1.35 mg/dL   Calcium 8.9  8.4 - 95.6 mg/dL   GFR calc non Af  Amer 62 (*) >90 mL/min   GFR calc Af Amer 71 (*) >90 mL/min     Studies/Results: @RISRSLT24 @  . alvimopan  12 mg Oral BID  . atorvastatin  10 mg Oral q1800  . doxazosin  8 mg Oral Daily  . heparin subcutaneous  5,000 Units Subcutaneous Q8H  . hydrochlorothiazide  25 mg Oral Daily  . levothyroxine  50 mcg Oral Custom  . [START ON 03/12/2013] levothyroxine  75 mcg Oral Custom  . morphine   Intravenous Q4H  . propranolol  40 mg Oral BID  . tamsulosin  0.4 mg Oral Daily  . verapamil  240 mg Oral QHS     Assessment/Plan: s/p Procedure(s): LAPAROSCOPic assisted right hemi COLECTOMY  POD #5. Right colectomy. Satisfactory progress. Advanced to full liquids Increase ambulation. Consider removing Foley tomorrow if he is more ambulatory PCA discontinued. Encouraged Percocet. Morphine available if needed.  Plan image guided biopsy of lung nodules next week to aid in postoperative evaluation for appears to be synchronous tumors  (renal and colon)  @PROBHOSP @  LOS: 5 days    Perlita Forbush M.  Derrell Lolling, M.D., [facility] General and Minimally invasive Surgery Breast and Colorectal Surgery Office:   [phone] Pager:   [phone]  03/11/2013  . .prob

## 2013-03-12 NOTE — Progress Notes (Signed)
6 Days Post-Op  Subjective: Doing much better. Had 2 loose stools. Tolerating full liquids. No nausea. Minimal pain. Excellent spirits.  Foley catheter reinserted 48-hour sig. Excellent urine output.  Objective: Vital signs in last 24 hours: Temp:  [97.8 F (36.6 C)-98.1 F (36.7 C)] 97.9 F (36.6 C) (03/23 0540) Pulse Rate:  [56-67] 62 (03/23 0540) Resp:  [12-18] 18 (03/23 0540) BP: (99-131)/(45-65) 99/50 mmHg (03/23 0540) SpO2:  [97 %-100 %] 99 % (03/23 0540) Last BM Date: 03/11/13  Intake/Output from previous day: 03/22 0701 - 03/23 0700 In: 2130 [P.O.:320; I.V.:1810] Out: 2725 [Urine:2725] Intake/Output this shift:    General appearance: alert. Cooperative. Mental status normal. In good spirits. No distress. GI: abdomen soft. Not distended. Wounds looked good. Good bowel sounds.  Lab Results:  No results found for this or any previous visit (from the past 24 hour(s)).   Studies/Results: @RISRSLT24 @  . atorvastatin  10 mg Oral q1800  . doxazosin  8 mg Oral Daily  . heparin subcutaneous  5,000 Units Subcutaneous Q8H  . hydrochlorothiazide  25 mg Oral Daily  . levothyroxine  50 mcg Oral Custom  . levothyroxine  75 mcg Oral Custom  . propranolol  40 mg Oral BID  . tamsulosin  0.4 mg Oral Daily  . verapamil  240 mg Oral QHS     Assessment/Plan: s/p Procedure(s): LAPAROSCOPic assisted right hemi COLECTOMY  POD #6. Right colectomy. Satisfactory progress.  Advanced to Low residue diet  Increase ambulation.  Remove Foley catheter today. Continue Flomax. 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 Dr. Maisie Fus will coordinate biopsy of these either as an inpatient or outpatient.     @PROBHOSP @  LOS: 6 days    Krysta Bloomfield M. Derrell Lolling, M.D., [facility] General and Minimally invasive Surgery Breast and Colorectal Surgery Office:   [phone] Pager:    [phone]  03/12/2013  . .prob

## 2013-03-13 ENCOUNTER — Other Ambulatory Visit (INDEPENDENT_AMBULATORY_CARE_PROVIDER_SITE_OTHER): Payer: Self-pay

## 2013-03-13 DIAGNOSIS — R339 Retention of urine, unspecified: Secondary | ICD-10-CM

## 2013-03-13 MED ORDER — TAMSULOSIN HCL 0.4 MG PO CAPS: 0.4000 mg | ORAL_CAPSULE | Freq: Every day | ORAL | Status: DC

## 2013-03-13 MED ORDER — OXYCODONE-ACETAMINOPHEN 5-325 MG PO TABS: 1.0000 | ORAL_TABLET | ORAL | Status: DC | PRN

## 2013-03-13 NOTE — Progress Notes (Signed)
LATE ENTRY-  Patient had foley removed at 10 am. Dribbled urine all day, and voided a measureable total of in addition. At 2130, bladder scan completed with a total of 740 mL. MD paged, and order to insert an indwelling Foley catheter obtained. MD will assess in morning rounds, and possibly consult urology. Will decide on rounds. Closely monitoring UO, pt status. Arshi Duarte [place], [place] 03/13/2013 3:34 AM for 03/12/2013 at 2050

## 2013-03-13 NOTE — Discharge Summary (Signed)
Physician Discharge Summary  Patient ID: Hunter Hardin. MRN: [id number] DOB/AGE: Apr 30, 1935 78 y.o.  Admit date: 03/06/2013 Discharge date: 03/13/2013  Admission Diagnoses:  Discharge Diagnoses:  Active Problems:   Benign neoplasm of ileocecal valve   Discharged Condition: good  Hospital Course: The patient was admitted after lap assisted right colectomy.  He tolerated this well.  His foley catheter was removed on POD 1.  His diet was advanced to clears but he developed a post operative ileus and had to be placed back on IV fluids.  This resolved slowly.  He did have some problems with urinary retention.  A foley was reinserted and he was started on Flomax.  We tried once again to remove the foley 48h after flomax was started, but he once again developed urinary retention and a new catheter was placed.  His laboratory values and clinical picture indicate no signs of infection.  I will discharge him with a foley catheter in place and have him follow up with Dr Mena Goes later this week.  He is scheduled to see Dr Clelia Croft tomorrow to continue his workup for his synchronous cancers.  Consults: None  Significant Diagnostic Studies: labs: CBC, Chemistries  Treatments: IV hydration, analgesia: Morphine and surgery: right colectomy  Discharge Exam: Blood pressure 110/48, pulse 59, temperature 98.5 F (36.9 C), temperature source Oral, resp. rate 18, height 5\' 7"  (1.702 m), weight 197 lb (89.359 kg), SpO2 96.00%. General appearance: alert and cooperative Resp: clear to auscultation bilaterally GI: soft, non-tender; bowel sounds normal Incision/Wound: clean, dry and intact  Disposition: Home   Future Appointments Provider Department Dept Phone   03/14/2013 1:30 PM [facility] [phone]   03/14/2013 1:45 PM Delcie Roch [facility] [phone]   03/14/2013 2:00 PM Benjiman Core, MD [facility] MEDICAL ONCOLOGY [phone]   03/22/2013 10:25 AM Romie Levee, MD [facility], [place] [phone]       Medication List    TAKE these medications       atorvastatin 10 MG tablet  Commonly known as:  LIPITOR  Take 10 mg by mouth every evening.     doxazosin 8 MG tablet  Commonly known as:  CARDURA  Take 8 mg by mouth daily before breakfast.     fish oil-omega-3 fatty acids 1000 MG capsule  Take 1 g by mouth 2 (two) times daily.     hydrochlorothiazide 25 MG tablet  Commonly known as:  HYDRODIURIL  Take 25 mg by mouth daily before breakfast.     levothyroxine 50 MCG tablet  Commonly known as:  SYNTHROID, LEVOTHROID  Take 50-75 mcg by mouth every evening. Takes 1 tablet Monday- Saturday then takes 1 and 1/2 tablet on sunday     oxyCODONE-acetaminophen 5-325 MG per tablet  Commonly known as:  PERCOCET/ROXICET  Take 1-2 tablets by mouth every 4 (four) hours as needed.     polyethylene glycol packet  Commonly known as:  MIRALAX / GLYCOLAX  Take 17 g by mouth daily.     propranolol 40 MG tablet  Commonly known as:  INDERAL  Take 40 mg by mouth 2 (two) times daily.     tamsulosin 0.4 MG Caps  Commonly known as:  FLOMAX  Take 1 capsule (0.4 mg total) by mouth daily.     verapamil 240 MG (CO) 24 hr tablet  Commonly known as:  COVERA HS  Take 240 mg by mouth  at bedtime.     vitamin C 500 MG tablet  Commonly known as:  ASCORBIC ACID  Take 500 mg by mouth daily.         Signed: Michalene Debruler C. 03/13/2013, 10:37 AM

## 2013-03-13 NOTE — Progress Notes (Signed)
Pt being discharged from unit with foley catheter.  Pt's standard drainage bag for foley was switched over to a leg bag.  Pt was showed by RN how to change out bags.  Pt stated he feels confident about being able to switch out bags at home.  No concerns from pt at this time.

## 2013-03-14 ENCOUNTER — Ambulatory Visit: Payer: Medicare Other

## 2013-03-14 ENCOUNTER — Other Ambulatory Visit (HOSPITAL_BASED_OUTPATIENT_CLINIC_OR_DEPARTMENT_OTHER): Payer: Medicare Other | Admitting: Lab

## 2013-03-14 ENCOUNTER — Ambulatory Visit (HOSPITAL_BASED_OUTPATIENT_CLINIC_OR_DEPARTMENT_OTHER): Payer: Medicare Other | Admitting: Oncology

## 2013-03-14 VITALS — BP 100/55 | HR 55 | Temp 97.0°F | Resp 18 | Ht 67.0 in | Wt 180.9 lb

## 2013-03-14 DIAGNOSIS — C189 Malignant neoplasm of colon, unspecified: Secondary | ICD-10-CM | POA: Diagnosis not present

## 2013-03-14 DIAGNOSIS — J984 Other disorders of lung: Secondary | ICD-10-CM

## 2013-03-14 DIAGNOSIS — N289 Disorder of kidney and ureter, unspecified: Secondary | ICD-10-CM

## 2013-03-14 DIAGNOSIS — N2889 Other specified disorders of kidney and ureter: Secondary | ICD-10-CM

## 2013-03-14 LAB — CBC WITH DIFFERENTIAL/PLATELET
Basophils Absolute: 0 10*3/uL (ref 0.0–0.1)
Eosinophils Absolute: 0.3 10*3/uL (ref 0.0–0.5)
HGB: 11.2 g/dL — ABNORMAL LOW (ref 13.0–17.1)
MCV: 85.3 fL (ref 79.3–98.0)
MONO%: 9.1 % (ref 0.0–14.0)
NEUT#: 6.5 10*3/uL (ref 1.5–6.5)
RDW: 14.1 % (ref 11.0–14.6)
lymph#: 2.9 10*3/uL (ref 0.9–3.3)

## 2013-03-14 LAB — COMPREHENSIVE METABOLIC PANEL (CC13)
Albumin: 2.9 g/dL — ABNORMAL LOW (ref 3.5–5.0)
BUN: 18.8 mg/dL (ref 7.0–26.0)
CO2: 25 mEq/L (ref 22–29)
Calcium: 10.2 mg/dL (ref 8.4–10.4)
Chloride: 102 mEq/L (ref 98–107)
Glucose: 131 mg/dl — ABNORMAL HIGH (ref 70–99)
Potassium: 4.1 mEq/L (ref 3.5–5.1)

## 2013-03-14 NOTE — Progress Notes (Signed)
Note dictated

## 2013-03-15 DIAGNOSIS — N400 Enlarged prostate without lower urinary tract symptoms: Secondary | ICD-10-CM | POA: Diagnosis not present

## 2013-03-15 NOTE — Progress Notes (Signed)
CC:   Dwana Curd. Para March, M.D. Romie Levee, MD Jerilee Field, MD  REASON FOR CONSULTATION:  Colon cancer, lung and kidney masses.  HISTORY OF PRESENT ILLNESS:  Hunter Hardin is a 78 year old gentleman of [place].  He lived the majority of his life around this area except for at times when he served in the [employer].  He is a gentleman with a past medical history significant for hypothyroidism and hypertension, who presented for a routine physical and was found to have Hemoccult- positive stool.  The patient at that time was referred to have a colonoscopy, which revealed an ileocecal mass.  A CT scan was done on 01/27/2013 of the abdomen and pelvis, which showed a mass actually arising from the pole of the left kidney that is worrisome for renal cell carcinoma.  This mass is measuring 5.2 x 4.8 x 5.0 cm.  Although no cecal mass was identified, there is an increased caliber of terminal ileum, which may be partially obstructed due to a possible mass.  A PET- CT scan was recommended at that time.  He also had a CT scan of the chest on 02/27/2013, which showed also mediastinal and bihilar periesophageal adenopathy with bilateral pulmonary nodules.  A PET-CT scan was performed on 02/08/2013 and showed a hypermetabolic left renal lesion consistent with possibly renal cell neoplasm, no evidence of any hypermetabolic lymphadenopathy, and hypermetabolic activity in the terminal ileum and the ileocecal valve consistent with also malignancy. The mediastinal and the periesophageal lymphadenopathy appeared to be mildly hypermetabolic.  Based on these findings, the patient underwent an evaluation by Dr. Maisie Fus and subsequently underwent a laparoscopic- assisted right hemicolectomy that was done on __________ 03/07/2011. The pathology from that tumor, case number [id number], showed invasive adenocarcinoma invading focally and through the muscularis propria into the pericolonic fat.  No  angiolymphatic invasion identified.  Twenty-one lymph nodes were negative for metastatic disease.  The resection margins are negative.  The clinical staging was T3 N0.  The patient recovered relatively well.  He was discharged on 03/13/2013 with a Foley catheter after urinary retention.  He did have a postoperative ileus and had to be placed on IV fluids, but recovered quite nicely.  He was discharged on 03/13/2013 and presents today for a followup visit.  Clinically since his discharge in the last 24 hours he had been doing relatively well. His appetite has improved.  He is not reporting any abdominal pain.  He is not reporting any nausea.  He does not report any vomiting.  He does not report any hematochezia.  His mobility, though, is still slow, but is improving.  He is not reporting any shortness of breath.  He does not report any difficulty breathing.  Performance status and activity level are actually improving and getting close to baseline.  REVIEW OF SYSTEMS:  He did not report any headaches, blurry vision, or double vision.  Did not report any motor or sensory neuropathy.  Did not report any alteration in mental status.  Did not report any psychiatric issues, depression.  Did not report any fever, chills, sweats.  He does not report any cough, hemoptysis, or hematemesis.  No nausea or vomiting.  No abdominal pain, hematochezia, melena, or genitourinary complaints.  No frequency, urgency, or hesitancy.  No musculoskeletal complaints.  No arthralgias, myalgias.  No heat or cold intolerance.  No bleeding, clotting, diathesis.  The rest of the review of systems is unremarkable.  PAST MEDICAL HISTORY:  Significant for hypertension, hypothyroidism, hyperlipidemia.  No  history of any diabetes, coronary artery disease, or any other malignancies.  He is status post recent partial colectomy as well as gallbladder operation.  ALLERGIES:  None.  MEDICATIONS:  He is on Lipitor, Cardura,  hydrochlorothiazide, Synthroid, Percocet, MiraLAX, Inderal, Flomax, verapamil, and vitamin C supplements.  FAMILY HISTORY:  Unremarkable.  SOCIAL HISTORY:  He is divorced, lives alone.  Served in the [employer] in [employer] and was discharged in 1976.  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  He used to smoke, quit a while ago.  He used to smoke about 1 pack a day for probably 20 years.  No alcohol abuse.  PHYSICAL EXAMINATION:  General:  Alert awake, pleasant gentleman, appeared in no active distress today.  Vital Signs:  Blood pressure is 100/55, pulse 55, respiration 18, temperature is 97, weight is 180 pounds.  ECOG performance status is 1.  HEENT:  Head is normocephalic, atraumatic.  Pupils are equal, round, and reactive to light.  Oral mucosa moist and pink.  Neck:  Supple, without adenopathy.  Heart: Regular rate and rhythm.  S1, S2.  Lungs:  Clear to auscultation.  No crackle, wheeze, or dullness to percussion.  Abdomen:  Soft, nontender. No hepatosplenomegaly.  Extremities:  No clubbing, cyanosis, or edema. Neurologic:  Intact motor, sensory, and deep tendon reflexes.  Skin: His abdominal incision appeared well healed.  No evidence of any erythema or drainage.  LABORATORY DATA:  Showed a hemoglobin of 11.2, white cell count of 10.7, platelet count 227,000.  ASSESSMENT AND PLAN:  A 78 year old gentleman with the following issues. 1. Diagnosis of a colon cancer:  He is status post a laparoscopic     right hemicolectomy, with the pathology revealing a T3 N0, with 0     out of 21 lymph nodes involved.  I discussed with him details, the     natural course of colon cancer, as well as the treatment options at     this time.  He had an excellent surgery, with no complications at     this point.  I discussed with him briefly the role of adjuvant     chemotherapy for stage II colon cancer.  At this time I would     probably put that discussion further on  hold as really in his     situation it is not a straightforward stage II colon cancer given     the fact that he has other abnormalities including possibly kidney     cancer and potentially the possibility of a lung involvement.  For     the time being, he probably will not receive adjuvant chemotherapy     for colon cancer.  We will sort out his other issues. 2. Kidney mass and lung lesions:  I discussed the possibilities at     this time with him for this being a possibly different primary.     One possibility is that it could be kidney cancer with lung     metastasis, or a kidney cancer and a lung primary.  I think that is     a less likely scenario at this time.  I probably favor more of a     kidney cancer with lung metastasis as a separate primary from his     colon.  Eventually he will need a biopsy.  At this time probably a     lung biopsy will be the ideal scenario because that will give [place]  the really most likely diagnosis.  He is following up also with Dr.     Mena Goes to discuss not only his urinary retention, but also his     renal mass; but ultimately, as mentioned, he will probably need a     biopsy.  I will schedule a quick followup with him in the next few     weeks.  By that time hopefully he is more recovered, he is able to     lie down, and can probably withstand that biopsy.  All of his     questions were answered today.    ______________________________ Benjiman Core, M.D. FNS/MEDQ  D:  03/14/2013  T:  03/15/2013  Job:  [id number]

## 2013-03-18 ENCOUNTER — Telehealth: Payer: Self-pay | Admitting: Oncology

## 2013-03-18 NOTE — Telephone Encounter (Signed)
S/w the pt and he is aware of his April 18th appts for the lab and the md visit.

## 2013-03-20 ENCOUNTER — Other Ambulatory Visit (INDEPENDENT_AMBULATORY_CARE_PROVIDER_SITE_OTHER): Payer: Self-pay | Admitting: General Surgery

## 2013-03-20 ENCOUNTER — Telehealth (INDEPENDENT_AMBULATORY_CARE_PROVIDER_SITE_OTHER): Payer: Self-pay | Admitting: *Deleted

## 2013-03-20 ENCOUNTER — Ambulatory Visit: Payer: Medicare Other | Admitting: Family Medicine

## 2013-03-20 ENCOUNTER — Telehealth: Payer: Self-pay | Admitting: *Deleted

## 2013-03-20 ENCOUNTER — Encounter (INDEPENDENT_AMBULATORY_CARE_PROVIDER_SITE_OTHER): Payer: Self-pay | Admitting: General Surgery

## 2013-03-20 ENCOUNTER — Ambulatory Visit (INDEPENDENT_AMBULATORY_CARE_PROVIDER_SITE_OTHER): Payer: Medicare Other | Admitting: General Surgery

## 2013-03-20 VITALS — BP 110/50 | HR 71 | Temp 97.8°F | Ht 67.0 in | Wt 186.4 lb

## 2013-03-20 DIAGNOSIS — T8140XA Infection following a procedure, unspecified, initial encounter: Secondary | ICD-10-CM

## 2013-03-20 DIAGNOSIS — L089 Local infection of the skin and subcutaneous tissue, unspecified: Secondary | ICD-10-CM

## 2013-03-20 DIAGNOSIS — A499 Bacterial infection, unspecified: Secondary | ICD-10-CM | POA: Diagnosis not present

## 2013-03-20 DIAGNOSIS — IMO0001 Reserved for inherently not codable concepts without codable children: Secondary | ICD-10-CM

## 2013-03-20 DIAGNOSIS — T8149XA Infection following a procedure, other surgical site, initial encounter: Secondary | ICD-10-CM | POA: Insufficient documentation

## 2013-03-20 MED ORDER — METRONIDAZOLE 250 MG PO TABS: 250.0000 mg | ORAL_TABLET | Freq: Three times a day (TID) | ORAL | Status: AC

## 2013-03-20 MED ORDER — CIPROFLOXACIN HCL 500 MG PO TABS: 500.0000 mg | ORAL_TABLET | Freq: Two times a day (BID) | ORAL | Status: AC

## 2013-03-20 NOTE — Telephone Encounter (Signed)
Patient called in saying he had surgery last week and his incision has drained all night.  He contacted the surgeon's office and they told him to contact us.  Lyla Son tentatively put him on the schedule at 2:30.  Is this right or does he need to see the surgeon?

## 2013-03-20 NOTE — Telephone Encounter (Signed)
I phoned [facility] and was informed that they have a few new girls working there that may have not given the correct instructions.  The receptionist asked me to have Hunter Hardin to call back and ask to speak to the triage nurse.  I informed the patient and he will phone them back and will cancel the appt here if it is no longer needed.

## 2013-03-20 NOTE — Telephone Encounter (Signed)
I'll be glad to see him but usually the surgery clinic that performed the operation would follow this.  Please see about setting that up.

## 2013-03-20 NOTE — Telephone Encounter (Signed)
Patient states redness and draining around incision site.  Appt made for patient in urgent office this afternoon.

## 2013-03-20 NOTE — Telephone Encounter (Signed)
Patient called back to cancel appt after speaking with triage nurse at [facility].

## 2013-03-20 NOTE — Progress Notes (Signed)
The patient comes in with pain redness and drainage from his midline wound. He is status post right colectomy for cancer on 03/06/2013.  The drainage started yesterday got increasingly worse. On examination today he has some mildly purulent midline drainage from the upper portion of his wound. With a Betadine prep I was able to open the wound approximately 3 cm and impacted with a wet 4 x 4 gauze with saline. A packet down to the fascial area. The thought as I could palpate some sutures in the wound. There was no necrosis. I do believe that the patient hasn't really superficial wound infection. Because of this and needing a packing I will place patient on ciprofloxacin and Flagyl for 10 days. He has an appointment to see his primary surgeon on 03/22/2013.

## 2013-03-21 ENCOUNTER — Telehealth: Payer: Self-pay | Admitting: Dietician

## 2013-03-21 NOTE — Telephone Encounter (Signed)
Brief Outpatient Oncology Nutrition Note  Patient has been identified to be at risk on malnutrition screen.   Wt Readings from Last 10 Encounters:  03/20/13 186 lb 6.4 oz (84.55 kg)  03/14/13 180 lb 14.4 oz (82.056 kg)  03/06/13 197 lb (89.359 kg)  03/06/13 197 lb (89.359 kg)  03/01/13 193 lb (87.544 kg)  02/22/13 197 lb 12.8 oz (89.721 kg)  01/25/13 201 lb (91.173 kg)  12/09/12 201 lb (91.173 kg)  11/03/12 199 lb (90.266 kg)  10/15/11 207 lb 1.9 oz (93.949 kg)    Spoke with patient's daughter.  Chart reviewed.  Patient s/p surgery for colon cancer and now with wound infection.  Discussed importance of adequate intake, encouraged protein and discussed sources.  Patient to also to increase fiber per daughter.  Questions answered.    Oran Rein, RD, LDN

## 2013-03-22 ENCOUNTER — Telehealth (INDEPENDENT_AMBULATORY_CARE_PROVIDER_SITE_OTHER): Payer: Self-pay | Admitting: General Surgery

## 2013-03-22 ENCOUNTER — Ambulatory Visit (INDEPENDENT_AMBULATORY_CARE_PROVIDER_SITE_OTHER): Payer: Medicare Other | Admitting: General Surgery

## 2013-03-22 ENCOUNTER — Encounter (INDEPENDENT_AMBULATORY_CARE_PROVIDER_SITE_OTHER): Payer: Self-pay | Admitting: General Surgery

## 2013-03-22 VITALS — BP 120/88 | HR 60 | Temp 97.0°F | Resp 20 | Wt 184.5 lb

## 2013-03-22 DIAGNOSIS — Z5189 Encounter for other specified aftercare: Secondary | ICD-10-CM

## 2013-03-22 DIAGNOSIS — T798XXD Other early complications of trauma, subsequent encounter: Secondary | ICD-10-CM

## 2013-03-22 DIAGNOSIS — R82998 Other abnormal findings in urine: Secondary | ICD-10-CM | POA: Diagnosis not present

## 2013-03-22 DIAGNOSIS — N400 Enlarged prostate without lower urinary tract symptoms: Secondary | ICD-10-CM | POA: Diagnosis not present

## 2013-03-22 NOTE — Progress Notes (Signed)
Hunter Hardin. is a 78 y.o. male who is status post an open R colectomy on 3/17.  He is doing well.  He was seen in the office on Monday, and his incision was opened.  He has been on Cipro and flagy.  He is having loose BM's.    Objective: Filed Vitals:   03/22/13 1008  BP: 120/88  Pulse: 60  Temp: 97 F (36.1 C)  Resp: 20    General appearance: alert and cooperative GI: soft, non-tender; bowel sounds normal; no masses,  no organomegaly  Incision: dehiscence present, packed, suture material within the wound   Assessment: s/p  Patient Active Problem List  Diagnosis  . HYPOTHYROIDISM  . HYPERLIPIDEMIA  . HYPERTENSION  . HYPERGLYCEMIA  . ELEVATED PROSTATE SPECIFIC ANTIGEN  . Medicare annual wellness visit, initial  . Preoperative evaluation to rule out surgical contraindication  . Benign neoplasm of ileocecal valve  . Postoperative wound infection   T3N0M0 colon cancer- Stage 2.    Plan: Hunter Hardin. is a 78 y.o. M s/p open colectomy with a post operative wound infection.  This is packed.  We will have home health come help him with the daily dressing changes.  I will see him back in 2 weeks.  He will continue to work with Dr Clelia Croft for his renal mass.       Vanita Panda, MD [facility], [place] [phone]   03/22/2013 10:32 AM

## 2013-03-22 NOTE — Telephone Encounter (Signed)
I have spoke with Elnita Maxwell at [facility]  for skilled nursing home health visit I have faxed over notes waiting on call back .

## 2013-03-23 DIAGNOSIS — I10 Essential (primary) hypertension: Secondary | ICD-10-CM | POA: Diagnosis not present

## 2013-03-23 DIAGNOSIS — T8140XA Infection following a procedure, unspecified, initial encounter: Secondary | ICD-10-CM | POA: Diagnosis not present

## 2013-03-24 DIAGNOSIS — I10 Essential (primary) hypertension: Secondary | ICD-10-CM | POA: Diagnosis not present

## 2013-03-24 DIAGNOSIS — T8140XA Infection following a procedure, unspecified, initial encounter: Secondary | ICD-10-CM | POA: Diagnosis not present

## 2013-03-27 DIAGNOSIS — I10 Essential (primary) hypertension: Secondary | ICD-10-CM | POA: Diagnosis not present

## 2013-03-27 DIAGNOSIS — T8140XA Infection following a procedure, unspecified, initial encounter: Secondary | ICD-10-CM | POA: Diagnosis not present

## 2013-03-28 ENCOUNTER — Encounter (INDEPENDENT_AMBULATORY_CARE_PROVIDER_SITE_OTHER): Payer: Self-pay

## 2013-03-31 DIAGNOSIS — L570 Actinic keratosis: Secondary | ICD-10-CM | POA: Diagnosis not present

## 2013-03-31 DIAGNOSIS — L219 Seborrheic dermatitis, unspecified: Secondary | ICD-10-CM | POA: Diagnosis not present

## 2013-04-04 DIAGNOSIS — I10 Essential (primary) hypertension: Secondary | ICD-10-CM | POA: Diagnosis not present

## 2013-04-04 DIAGNOSIS — T8140XA Infection following a procedure, unspecified, initial encounter: Secondary | ICD-10-CM | POA: Diagnosis not present

## 2013-04-05 ENCOUNTER — Encounter (INDEPENDENT_AMBULATORY_CARE_PROVIDER_SITE_OTHER): Payer: Self-pay | Admitting: General Surgery

## 2013-04-05 ENCOUNTER — Ambulatory Visit (INDEPENDENT_AMBULATORY_CARE_PROVIDER_SITE_OTHER): Payer: Medicare Other | Admitting: General Surgery

## 2013-04-05 VITALS — BP 100/56 | HR 77 | Temp 98.2°F | Resp 16 | Ht 67.0 in | Wt 184.0 lb

## 2013-04-05 DIAGNOSIS — Z9889 Other specified postprocedural states: Secondary | ICD-10-CM

## 2013-04-05 NOTE — Patient Instructions (Signed)
Continue to pack wound daily.  Return to the office in 2 weeks.

## 2013-04-05 NOTE — Progress Notes (Signed)
Hunter Hardin. is a 78 y.o. male who is here for a follow up visit regarding abd wound.  This is doing well.  He is packing it daily.    Objective: Filed Vitals:   04/05/13 1149  BP: 100/56  Pulse: 77  Temp: 98.2 F (36.8 C)  Resp: 16    General appearance: alert and appears stated age wound: healing well, much smaller than last visit   Assessment and Plan: Cont packing daily.  RTO in 2 weeks.      Vanita Panda, MD [facility], [place] [phone]

## 2013-04-07 ENCOUNTER — Other Ambulatory Visit (HOSPITAL_BASED_OUTPATIENT_CLINIC_OR_DEPARTMENT_OTHER): Payer: Medicare Other | Admitting: Lab

## 2013-04-07 ENCOUNTER — Telehealth: Payer: Self-pay | Admitting: Oncology

## 2013-04-07 ENCOUNTER — Ambulatory Visit (HOSPITAL_BASED_OUTPATIENT_CLINIC_OR_DEPARTMENT_OTHER): Payer: Medicare Other | Admitting: Oncology

## 2013-04-07 VITALS — BP 100/61 | HR 71 | Temp 97.1°F | Resp 20 | Ht 67.0 in | Wt 181.4 lb

## 2013-04-07 DIAGNOSIS — J984 Other disorders of lung: Secondary | ICD-10-CM

## 2013-04-07 DIAGNOSIS — N289 Disorder of kidney and ureter, unspecified: Secondary | ICD-10-CM

## 2013-04-07 DIAGNOSIS — C189 Malignant neoplasm of colon, unspecified: Secondary | ICD-10-CM

## 2013-04-07 DIAGNOSIS — R918 Other nonspecific abnormal finding of lung field: Secondary | ICD-10-CM

## 2013-04-07 LAB — COMPREHENSIVE METABOLIC PANEL (CC13)
AST: 57 U/L — ABNORMAL HIGH (ref 5–34)
Albumin: 2.5 g/dL — ABNORMAL LOW (ref 3.5–5.0)
BUN: 15.7 mg/dL (ref 7.0–26.0)
Calcium: 9 mg/dL (ref 8.4–10.4)
Chloride: 102 mEq/L (ref 98–107)
Glucose: 125 mg/dl — ABNORMAL HIGH (ref 70–99)
Potassium: 3.7 mEq/L (ref 3.5–5.1)
Sodium: 137 mEq/L (ref 136–145)
Total Protein: 6.8 g/dL (ref 6.4–8.3)

## 2013-04-07 LAB — CBC WITH DIFFERENTIAL/PLATELET
Basophils Absolute: 0 10*3/uL (ref 0.0–0.1)
Eosinophils Absolute: 0.3 10*3/uL (ref 0.0–0.5)
HGB: 9.9 g/dL — ABNORMAL LOW (ref 13.0–17.1)
NEUT#: 3.2 10*3/uL (ref 1.5–6.5)
RDW: 15.1 % — ABNORMAL HIGH (ref 11.0–14.6)
WBC: 6.3 10*3/uL (ref 4.0–10.3)
lymph#: 2.2 10*3/uL (ref 0.9–3.3)

## 2013-04-07 NOTE — Progress Notes (Signed)
Hematology and Oncology Follow Up Visit  Hunter Hardin [id number] July 24, 1935 78 y.o. 04/07/2013 3:16 PM   Principle Diagnosis: 78 year old with: 1. T3 N0 colon cancer: He is status post a laparoscopic right hemicolectomy, with 0 out of 21 lymph nodes involved. That was done on 03/06/2013. 2. Kidney mass and lung lesions. Work up is ongoing.   Current therapy: Under evaluation for possible kidney cancer and lung mets.   Interim History: Hunter Hardin presents today for a follow up visit. He is a nice man I saw back on 03/14/2013 for the above issues. He reports he developed a wound infection from his recent colectomy surgery but is healing very well. Clinically, he has been doing relatively well.  His appetite has improved. He is not reporting any abdominal pain. He is not reporting any nausea. He does not report any vomiting. He does not report any hematochezia. He is not reporting any shortness of breath. He does not report any difficulty breathing. Performance status and activity level  are actually improving and getting close to baseline.      Medications: I have reviewed the patient's current medications. Current outpatient prescriptions:atorvastatin (LIPITOR) 10 MG tablet, Take 10 mg by mouth every evening., Disp: , Rfl: ;  doxazosin (CARDURA) 8 MG tablet, Take 8 mg by mouth daily before breakfast., Disp: , Rfl: ;  fish oil-omega-3 fatty acids 1000 MG capsule, Take 1 g by mouth 2 (two) times daily., Disp: , Rfl: ;  hydrochlorothiazide (HYDRODIURIL) 25 MG tablet, Take 25 mg by mouth daily before breakfast., Disp: , Rfl:  levothyroxine (SYNTHROID, LEVOTHROID) 50 MCG tablet, Take 50-75 mcg by mouth every evening. Takes 1 tablet Monday- Saturday then takes 1 and 1/2 tablet on sunday, Disp: , Rfl: ;  polyethylene glycol (MIRALAX / GLYCOLAX) packet, Take 17 g by mouth daily., Disp: , Rfl: ;  propranolol (INDERAL) 40 MG tablet, Take 40 mg by mouth 2 (two) times daily., Disp: , Rfl:  tamsulosin  (FLOMAX) 0.4 MG CAPS, Take 1 capsule (0.4 mg total) by mouth daily., Disp: 15 capsule, Rfl: 0;  verapamil (COVERA HS) 240 MG (CO) 24 hr tablet, Take 240 mg by mouth at bedtime., Disp: , Rfl: ;  vitamin C (ASCORBIC ACID) 500 MG tablet, Take 500 mg by mouth daily., Disp: , Rfl:   Allergies: No Known Allergies  Past Medical History, Surgical history, Social history, and Family History were reviewed and updated.  Review of Systems: Constitutional:  Negative for fever, chills, night sweats, anorexia, weight loss, pain. Cardiovascular: negative Respiratory: negative Neurological: negative Dermatological: negative ENT: negative Skin: Negative. Gastrointestinal: negative Genito-Urinary: negative Hematological and Lymphatic: negative Breast: negative Musculoskeletal: negative Remaining ROS negative. Physical Exam: Blood pressure 100/61, pulse 71, temperature 97.1 F (36.2 C), temperature source Oral, resp. rate 20, height 5\' 7"  (1.702 m), weight 181 lb 6.4 oz (82.283 kg). ECOG: 1 General appearance: alert Head: Normocephalic, without obvious abnormality, atraumatic Neck: no adenopathy, no carotid bruit, no JVD, supple, symmetrical, trachea midline and thyroid not enlarged, symmetric, no tenderness/mass/nodules Lymph nodes: Cervical, supraclavicular, and axillary nodes normal. Heart:regular rate and rhythm, S1, S2 normal, no murmur, click, rub or gallop Lung:chest clear, no wheezing, rales, normal symmetric air entry Abdomin: soft, non-tender, without masses or organomegaly EXT:no erythema, induration, or nodules   Lab Results: Lab Results  Component Value Date   WBC 6.3 04/07/2013   HGB 9.9* 04/07/2013   HCT 30.7* 04/07/2013   MCV 81.4 04/07/2013   PLT 255 04/07/2013  Chemistry      Component Value Date/Time   NA 137 04/07/2013 1413   NA 134* 03/11/2013 0545   K 3.7 04/07/2013 1413   K 3.6 03/11/2013 0545   CL 102 04/07/2013 1413   CL 100 03/11/2013 0545   CO2 27 04/07/2013 1413    CO2 26 03/11/2013 0545   BUN 15.7 04/07/2013 1413   BUN 15 03/11/2013 0545   CREATININE 1.1 04/07/2013 1413   CREATININE 1.13 03/11/2013 0545      Component Value Date/Time   CALCIUM 9.0 04/07/2013 1413   CALCIUM 8.9 03/11/2013 0545   ALKPHOS 73 04/07/2013 1413   ALKPHOS 46 01/26/2013 1002   AST 57* 04/07/2013 1413   AST 22 01/26/2013 1002   ALT 66* 04/07/2013 1413   ALT 24 01/26/2013 1002   BILITOT 0.51 04/07/2013 1413   BILITOT 0.9 01/26/2013 1002      Impression and Plan:  A 78 year old gentleman with the following issues.  1. Diagnosis of a colon cancer: He is status post a laparoscopic right hemicolectomy, with the pathology revealing a T3 N0, with 0  out of 21 lymph nodes involved. He probably will not receive adjuvant chemotherapy for colon cancer given that he is likely has stage IV kidney cancer.   2. Kidney mass and lung lesions. He will need biopsy done as soon as is able to lie flat and it seems he is close to doing so.  I will arrange that to be done in early may and follow up after that.      [facility], MD 4/18/20143:16 PM

## 2013-04-11 DIAGNOSIS — T8140XA Infection following a procedure, unspecified, initial encounter: Secondary | ICD-10-CM | POA: Diagnosis not present

## 2013-04-11 DIAGNOSIS — I10 Essential (primary) hypertension: Secondary | ICD-10-CM | POA: Diagnosis not present

## 2013-04-12 DIAGNOSIS — N400 Enlarged prostate without lower urinary tract symptoms: Secondary | ICD-10-CM | POA: Diagnosis not present

## 2013-04-12 DIAGNOSIS — D4959 Neoplasm of unspecified behavior of other genitourinary organ: Secondary | ICD-10-CM | POA: Diagnosis not present

## 2013-04-12 DIAGNOSIS — R972 Elevated prostate specific antigen [PSA]: Secondary | ICD-10-CM | POA: Diagnosis not present

## 2013-04-15 ENCOUNTER — Other Ambulatory Visit: Payer: Self-pay | Admitting: Family Medicine

## 2013-04-18 DIAGNOSIS — I10 Essential (primary) hypertension: Secondary | ICD-10-CM | POA: Diagnosis not present

## 2013-04-18 DIAGNOSIS — T8140XA Infection following a procedure, unspecified, initial encounter: Secondary | ICD-10-CM | POA: Diagnosis not present

## 2013-04-21 ENCOUNTER — Encounter (HOSPITAL_COMMUNITY): Payer: Self-pay | Admitting: Pharmacy Technician

## 2013-04-24 ENCOUNTER — Telehealth: Payer: Self-pay | Admitting: Dietician

## 2013-04-24 NOTE — Telephone Encounter (Signed)
Brief Outpatient Oncology Nutrition Note  Patient has been identified to be at risk on malnutrition screen.  Wt Readings from Last 10 Encounters:  04/07/13 181 lb 6.4 oz (82.283 kg)  04/05/13 184 lb (83.462 kg)  03/22/13 184 lb 8 oz (83.689 kg)  03/20/13 186 lb 6.4 oz (84.55 kg)  03/14/13 180 lb 14.4 oz (82.056 kg)  03/06/13 197 lb (89.359 kg)  03/06/13 197 lb (89.359 kg)  03/01/13 193 lb (87.544 kg)  02/22/13 197 lb 12.8 oz (89.721 kg)  01/25/13 201 lb (91.173 kg)    Called and spoke with patient due to continued weight loss.  Called and spoke with patient's daughter last month.  Patient reports that appetite has returned, metallic taste has resolved and that he is eating well.  Reports that his wound is healing and a nurse is coming tomorrow to look at it.  Encouraged use of nutritional supplements as needed.  Encouraged increased intake, being sure of adequate protein intake.    Patient to call for any questions.  Oran Rein, RD, LDN

## 2013-04-25 DIAGNOSIS — T8140XA Infection following a procedure, unspecified, initial encounter: Secondary | ICD-10-CM | POA: Diagnosis not present

## 2013-04-25 DIAGNOSIS — I10 Essential (primary) hypertension: Secondary | ICD-10-CM | POA: Diagnosis not present

## 2013-04-26 ENCOUNTER — Other Ambulatory Visit: Payer: Self-pay | Admitting: Radiology

## 2013-04-27 ENCOUNTER — Ambulatory Visit (INDEPENDENT_AMBULATORY_CARE_PROVIDER_SITE_OTHER): Payer: Medicare Other | Admitting: General Surgery

## 2013-04-27 ENCOUNTER — Telehealth (INDEPENDENT_AMBULATORY_CARE_PROVIDER_SITE_OTHER): Payer: Self-pay

## 2013-04-27 ENCOUNTER — Encounter (INDEPENDENT_AMBULATORY_CARE_PROVIDER_SITE_OTHER): Payer: Self-pay | Admitting: General Surgery

## 2013-04-27 ENCOUNTER — Other Ambulatory Visit: Payer: Self-pay | Admitting: Radiology

## 2013-04-27 VITALS — BP 120/82 | HR 66 | Temp 97.7°F | Resp 18 | Wt 181.5 lb

## 2013-04-27 DIAGNOSIS — Z9889 Other specified postprocedural states: Secondary | ICD-10-CM

## 2013-04-27 NOTE — Progress Notes (Signed)
Hunter Hardin. is a 78 y.o. male who is here for a follow up visit regarding his abd wound. This is doing well. He has stopped packing it.  It is almost completely healed.  He is eating well and denies abd pain or change in BM's.  He is scheduled to get his lung nodules biopsied on 5/13.   Objective:  Filed Vitals:   04/27/13 1350  BP: 120/82  Pulse: 66  Temp: 97.7 F (36.5 C)  Resp: 18   General appearance: alert and appears stated age  wound: healing well, much smaller than last visit, almost completely healed  Assessment and Plan:  Ok to stop packing wound.  D/C home health.   Ok for chemo from my standpoint.  I will see him back in late June for his cancer follow up .Vanita Panda, MD  [facility], [place]  [phone]

## 2013-04-27 NOTE — Patient Instructions (Signed)
Cover the wound with a dry dressing as needed.

## 2013-04-27 NOTE — Telephone Encounter (Signed)
I called and notified the nurse with [facility] that she doesn't have to go out and see him anymore.

## 2013-05-02 ENCOUNTER — Inpatient Hospital Stay (HOSPITAL_COMMUNITY): Admission: RE | Admit: 2013-05-02 | Payer: Medicare Other | Source: Ambulatory Visit

## 2013-05-02 ENCOUNTER — Ambulatory Visit (HOSPITAL_COMMUNITY): Payer: Medicare Other

## 2013-05-03 ENCOUNTER — Ambulatory Visit (HOSPITAL_COMMUNITY)
Admission: RE | Admit: 2013-05-03 | Discharge: 2013-05-03 | Disposition: A | Discharge status: Home / Self Care | Payer: Medicare Other | Source: Ambulatory Visit | Attending: Interventional Radiology | Admitting: Interventional Radiology

## 2013-05-03 ENCOUNTER — Ambulatory Visit (HOSPITAL_COMMUNITY)
Admission: RE | Admit: 2013-05-03 | Discharge: 2013-05-03 | Disposition: A | Discharge status: Home / Self Care | Payer: Medicare Other | Source: Ambulatory Visit | Attending: Oncology | Admitting: Oncology

## 2013-05-03 ENCOUNTER — Encounter (HOSPITAL_COMMUNITY): Payer: Self-pay

## 2013-05-03 VITALS — BP 123/74 | HR 50 | Temp 97.7°F | Resp 16

## 2013-05-03 DIAGNOSIS — C649 Malignant neoplasm of unspecified kidney, except renal pelvis: Secondary | ICD-10-CM | POA: Insufficient documentation

## 2013-05-03 DIAGNOSIS — Z9089 Acquired absence of other organs: Secondary | ICD-10-CM | POA: Diagnosis not present

## 2013-05-03 DIAGNOSIS — E785 Hyperlipidemia, unspecified: Secondary | ICD-10-CM | POA: Insufficient documentation

## 2013-05-03 DIAGNOSIS — I10 Essential (primary) hypertension: Secondary | ICD-10-CM | POA: Insufficient documentation

## 2013-05-03 DIAGNOSIS — N289 Disorder of kidney and ureter, unspecified: Secondary | ICD-10-CM | POA: Diagnosis not present

## 2013-05-03 DIAGNOSIS — R911 Solitary pulmonary nodule: Secondary | ICD-10-CM | POA: Insufficient documentation

## 2013-05-03 DIAGNOSIS — R599 Enlarged lymph nodes, unspecified: Secondary | ICD-10-CM | POA: Diagnosis not present

## 2013-05-03 DIAGNOSIS — R918 Other nonspecific abnormal finding of lung field: Secondary | ICD-10-CM

## 2013-05-03 DIAGNOSIS — E039 Hypothyroidism, unspecified: Secondary | ICD-10-CM | POA: Diagnosis not present

## 2013-05-03 DIAGNOSIS — C7800 Secondary malignant neoplasm of unspecified lung: Secondary | ICD-10-CM | POA: Insufficient documentation

## 2013-05-03 DIAGNOSIS — J984 Other disorders of lung: Secondary | ICD-10-CM | POA: Diagnosis not present

## 2013-05-03 DIAGNOSIS — C3430 Malignant neoplasm of lower lobe, unspecified bronchus or lung: Secondary | ICD-10-CM | POA: Diagnosis not present

## 2013-05-03 DIAGNOSIS — Z85038 Personal history of other malignant neoplasm of large intestine: Secondary | ICD-10-CM | POA: Insufficient documentation

## 2013-05-03 LAB — CBC
MCH: 27.2 pg (ref 26.0–34.0)
Platelets: 173 10*3/uL (ref 150–400)
RBC: 4.16 MIL/uL — ABNORMAL LOW (ref 4.22–5.81)
RDW: 15.2 % (ref 11.5–15.5)
WBC: 9.2 10*3/uL (ref 4.0–10.5)

## 2013-05-03 LAB — PROTIME-INR: Prothrombin Time: 14.5 seconds (ref 11.6–15.2)

## 2013-05-03 MED ORDER — FENTANYL CITRATE 0.05 MG/ML IJ SOLN
INTRAMUSCULAR | Status: AC
  Filled 2013-05-03: qty 6

## 2013-05-03 MED ORDER — FENTANYL CITRATE 0.05 MG/ML IJ SOLN
INTRAMUSCULAR | Status: AC | PRN
  Administered 2013-05-03 (×2): 50 ug via INTRAVENOUS

## 2013-05-03 MED ORDER — SODIUM CHLORIDE 0.9 % IV SOLN
INTRAVENOUS | Status: DC
  Administered 2013-05-03: 09:00:00 via INTRAVENOUS

## 2013-05-03 MED ORDER — MIDAZOLAM HCL 2 MG/2ML IJ SOLN
INTRAMUSCULAR | Status: AC | PRN
  Administered 2013-05-03: 1 mg via INTRAVENOUS
  Administered 2013-05-03 (×2): 0.5 mg via INTRAVENOUS

## 2013-05-03 MED ORDER — MIDAZOLAM HCL 2 MG/2ML IJ SOLN
INTRAMUSCULAR | Status: AC
  Filled 2013-05-03: qty 6

## 2013-05-03 NOTE — Procedures (Signed)
Technically successful CT guided biopsy of left lower lobe pulmonary nodule.  No immediate post procedural complications.   

## 2013-05-03 NOTE — H&P (Signed)
Chief Complaint: "I'm here for a biopsy" Referring Physician:Shadad HPI: Hunter Hardin. is an 78 y.o. male who recently underwent partial colectomy for colon cancer, but also has a large left renal mass and multiple pulmonary nodules and adenopathy. His case has been discussed and the decision to pursue lung lesion biopsy to eval for metastatic vs. Primary malignancy. PMHx and meds reviewed. Pt feels well and is recovering from his abdominal surgery.  Past Medical History:  Past Medical History  Diagnosis Date  . Hypothyroidism   . Hypertension   . HLD (hyperlipidemia)   . Elevated PSA     H/O  . GERD (gastroesophageal reflux disease)     Past Surgical History:  Past Surgical History  Procedure Laterality Date  . Cholecystectomy  early 31's  . Vasectomy    . Colonoscopy  01/2013  . Laparoscopic partial colectomy N/A 03/06/2013    Procedure: LAPAROSCOPic assisted right hemi COLECTOMY;  Surgeon: Romie Levee, MD;  Location: [facility]S;  Service: General;  Laterality: N/A;    Family History:  Family History  Problem Relation Age of Onset  . Hypertension Mother   . Hyperlipidemia Mother   . Dementia Mother   . Heart disease Father     ? MI, CAD  . Diabetes Cousin   . Cancer Brother     oral/tonsil cancer 2012  . Colon cancer Neg Hx   . Prostate cancer Neg Hx   . Cancer Sister     breast cancer    Social History:  reports that he quit smoking about 27 years ago. His smoking use included Cigarettes. He has a 60 pack-year smoking history. He has never used smokeless tobacco. He reports that he does not drink alcohol or use illicit drugs.  Allergies: No Known Allergies  Medications: atorvastatin (LIPITOR) 10 MG tablet (Taking) Sig - Route: Take 10 mg by mouth at bedtime. - Oral Class: Historical Med doxazosin (CARDURA) 8 MG tablet (Taking) Sig - Route: Take 8 mg by mouth daily before breakfast. - Oral Class: Historical Med fish oil-omega-3 fatty acids 1000 MG capsule  (Taking) Sig - Route: Take 1 g by mouth 2 (two) times daily. - Oral Class: Historical Med hydrochlorothiazide (HYDRODIURIL) 25 MG tablet (Taking) Sig - Route: Take 25 mg by mouth daily before breakfast. - Oral Class: Historical Med levothyroxine (SYNTHROID, LEVOTHROID) 50 MCG tablet (Taking) Sig - Route: Take 50-75 mcg by mouth every evening. Takes 1 tablet Monday- Saturday then takes 1 and 1/2 tablet on sunday - Oral Class: Historical Med Number of times this order has been changed since signing: 1 Order [address] polyethylene glycol (MIRALAX / GLYCOLAX) packet (Taking) Sig - Route: Take 17 g by mouth daily. - Oral Class: Historical Med propranolol (INDERAL) 40 MG tablet (Taking) Sig - Route: Take 40 mg by mouth 2 (two) times daily. - Oral Class: Historical Med verapamil (COVERA HS) 240 MG (CO) 24 hr tablet (Taking) Sig - Route: Take 240 mg by mouth at bedtime. - Oral Class: Historical Med vitamin C (ASCORBIC ACID) 500 MG tablet (Taking) Sig - Route: Take 500 mg by mouth daily   Please HPI for pertinent positives, otherwise complete 10 system ROS negative.  Physical Exam: Blood pressure 127/62, pulse 56, temperature 97.7 F (36.5 C), resp. rate 20, SpO2 97.00%. There is no weight on file to calculate BMI.   General Appearance:  Alert, cooperative, no distress, appears stated age  Head:  Normocephalic, without obvious abnormality, atraumatic  ENT: Unremarkable  Neck:  Supple, symmetrical, trachea midline  Lungs:   Clear to auscultation bilaterally, no w/r/r, respirations unlabored without use of accessory muscles.  Chest Wall:  No tenderness or deformity  Heart:  Regular rate and rhythm, S1, S2 normal, no murmur, rub or gallop.   Abdomen:   Soft, non-tender, non distended. Small dressing over healing midline incision, no purulence or odor.  Neurologic: Normal affect, no gross deficits.   CBC    Component Value Date/Time   WBC 9.2 05/03/2013 0900   RBC 4.16* 05/03/2013 0900   HGB 11.3*  05/03/2013 0900   HCT 34.3* 05/03/2013 0900   PLT 173 05/03/2013 0900   Prothrombin Time/INR 15.5/1.15    Assessment/Plan Left renal mass Lung nodules and adenopathy Discussed CT guided left lower lung nodule biopsy under sedation. Explained procedure, risks, complications including infection, hemorrhage/hemoptysis, PTX requiring chest drain/admission. Labs reviewed. Consent signed in chart  Brayton El PA-C 05/03/2013, 10:23 AM

## 2013-05-09 ENCOUNTER — Ambulatory Visit (HOSPITAL_BASED_OUTPATIENT_CLINIC_OR_DEPARTMENT_OTHER): Payer: Medicare Other | Admitting: Lab

## 2013-05-09 ENCOUNTER — Encounter: Payer: Self-pay | Admitting: Oncology

## 2013-05-09 ENCOUNTER — Telehealth: Payer: Self-pay | Admitting: Oncology

## 2013-05-09 ENCOUNTER — Ambulatory Visit (HOSPITAL_BASED_OUTPATIENT_CLINIC_OR_DEPARTMENT_OTHER): Payer: Medicare Other | Admitting: Oncology

## 2013-05-09 VITALS — BP 113/55 | HR 69 | Temp 97.4°F | Resp 18 | Ht 67.0 in | Wt 186.4 lb

## 2013-05-09 DIAGNOSIS — C189 Malignant neoplasm of colon, unspecified: Secondary | ICD-10-CM

## 2013-05-09 DIAGNOSIS — C649 Malignant neoplasm of unspecified kidney, except renal pelvis: Secondary | ICD-10-CM | POA: Diagnosis not present

## 2013-05-09 DIAGNOSIS — C801 Malignant (primary) neoplasm, unspecified: Secondary | ICD-10-CM | POA: Diagnosis not present

## 2013-05-09 DIAGNOSIS — C7800 Secondary malignant neoplasm of unspecified lung: Secondary | ICD-10-CM | POA: Diagnosis not present

## 2013-05-09 LAB — COMPREHENSIVE METABOLIC PANEL (CC13)
ALT: 27 U/L (ref 0–55)
AST: 22 U/L (ref 5–34)
Albumin: 3.2 g/dL — ABNORMAL LOW (ref 3.5–5.0)
CO2: 24 mEq/L (ref 22–29)
Calcium: 9.2 mg/dL (ref 8.4–10.4)
Chloride: 105 mEq/L (ref 98–107)
Potassium: 3.9 mEq/L (ref 3.5–5.1)
Total Protein: 7.2 g/dL (ref 6.4–8.3)

## 2013-05-09 MED ORDER — PAZOPANIB HCL 200 MG PO TABS: 800.0000 mg | ORAL_TABLET | Freq: Every day | ORAL | Status: DC

## 2013-05-09 NOTE — Progress Notes (Signed)
Hematology and Oncology Follow Up Visit  Langley Ingalls [id number] 1935/05/10 78 y.o. 05/09/2013 12:24 PM   Principle Diagnosis: 78 year old with: 1. T3 N0 colon cancer: He is status post a laparoscopic right hemicolectomy, with 0 out of 21 lymph nodes involved. That was done on 03/06/2013. 2. Kidney mass and lung lesions. Work up is ongoing.   Current therapy: Under evaluation for possible kidney cancer and lung mets.   Interim History: Mr. Bello presents today for a follow up visit. He is a nice man I saw back on 03/14/2013 for the above issues. He reports he developed a wound infection from his recent colectomy surgery but is healing very well. He underwent a lung biopsy without complications.Clinically, he has been doing relatively well.  His appetite has improved. He is not reporting any abdominal pain. He is not reporting any nausea. He does not report any vomiting. He does not report any hematochezia. He is not reporting any shortness of breath. He does not report any difficulty breathing. Performance status and activity level  are actually improving and getting close to baseline.      Medications: I have reviewed the patient's current medications.  Current Outpatient Prescriptions  Medication Sig Dispense Refill  . atorvastatin (LIPITOR) 10 MG tablet Take 10 mg by mouth at bedtime.      [name] doxazosin (CARDURA) 8 MG tablet Take 8 mg by mouth daily before breakfast.      . fish oil-omega-3 fatty acids 1000 MG capsule Take 1 g by mouth 2 (two) times daily.      . hydrochlorothiazide (HYDRODIURIL) 25 MG tablet Take 25 mg by mouth daily before breakfast.      . lactose free nutrition (BOOST) LIQD Take 237 mLs by mouth daily.      [name] levothyroxine (SYNTHROID, LEVOTHROID) 50 MCG tablet Take 50-75 mcg by mouth every evening. Takes 1 tablet Monday- Saturday then takes 1 and 1/2 tablet on sunday      . polyethylene glycol (MIRALAX / GLYCOLAX) packet Take 17 g by mouth daily.      .  propranolol (INDERAL) 40 MG tablet Take 40 mg by mouth 2 (two) times daily.      . verapamil (COVERA HS) 240 MG (CO) 24 hr tablet Take 240 mg by mouth at bedtime.      . vitamin C (ASCORBIC ACID) 500 MG tablet Take 500 mg by mouth daily.      . pazopanib (VOTRIENT) 200 MG tablet Take 4 tablets (800 mg total) by mouth daily. Take on an empty stomach.  120 tablet  0   No current facility-administered medications for this visit.    Allergies: No Known Allergies  Past Medical History, Surgical history, Social history, and Family History were reviewed and updated.  Review of Systems: Constitutional:  Negative for fever, chills, night sweats, anorexia, weight loss, pain. Cardiovascular: negative Respiratory: negative Neurological: negative Dermatological: negative ENT: negative Skin: Negative. Gastrointestinal: negative Genito-Urinary: negative Hematological and Lymphatic: negative Breast: negative Musculoskeletal: negative Remaining ROS negative. Physical Exam: Blood pressure 113/55, pulse 69, temperature 97.4 F (36.3 C), temperature source Oral, resp. rate 18, height 5\' 7"  (1.702 m), weight 186 lb 6.4 oz (84.55 kg), SpO2 99.00%. ECOG: 1 General appearance: alert Head: Normocephalic, without obvious abnormality, atraumatic Neck: no adenopathy, no carotid bruit, no JVD, supple, symmetrical, trachea midline and thyroid not enlarged, symmetric, no tenderness/mass/nodules Lymph nodes: Cervical, supraclavicular, and axillary nodes normal. Heart:regular rate and rhythm, S1, S2 normal, no murmur, click,  rub or gallop Lung:chest clear, no wheezing, rales, normal symmetric air entry Abdomin: soft, non-tender, without masses or organomegaly EXT:no erythema, induration, or nodules   Lab Results: Lab Results  Component Value Date   WBC 9.2 05/03/2013   HGB 11.3* 05/03/2013   HCT 34.3* 05/03/2013   MCV 82.5 05/03/2013   PLT 173 05/03/2013     Chemistry      Component Value Date/Time    NA 137 04/07/2013 1413   NA 134* 03/11/2013 0545   K 3.7 04/07/2013 1413   K 3.6 03/11/2013 0545   CL 102 04/07/2013 1413   CL 100 03/11/2013 0545   CO2 27 04/07/2013 1413   CO2 26 03/11/2013 0545   BUN 15.7 04/07/2013 1413   BUN 15 03/11/2013 0545   CREATININE 1.1 04/07/2013 1413   CREATININE 1.13 03/11/2013 0545      Component Value Date/Time   CALCIUM 9.0 04/07/2013 1413   CALCIUM 8.9 03/11/2013 0545   ALKPHOS 73 04/07/2013 1413   ALKPHOS 46 01/26/2013 1002   AST 57* 04/07/2013 1413   AST 22 01/26/2013 1002   ALT 66* 04/07/2013 1413   ALT 24 01/26/2013 1002   BILITOT 0.51 04/07/2013 1413   BILITOT 0.9 01/26/2013 1002     Pathology: 05/03/2013 Lung biopsy showed malignant cells. Stains could not be done, but by morphology looks like kidney cancer rather than colon cancer.  Impression and Plan:  A 78 year old gentleman with the following issues.  1. Diagnosis of a colon cancer: He is status post a laparoscopic right hemicolectomy, with the pathology revealing a T3 N0, with 0  out of 21 lymph nodes involved. He probably will not receive adjuvant chemotherapy for colon cancer given that he is likely has stage IV kidney cancer.   2. Kidney mass and lung lesions. Biopsy results discussed with Dr. Dierdre Searles the reviewing pathologist. It appears that he has stage IV kidney cancer. I discussed treatment options at this point. These include an up front nephrotomy vs systemic therapy first. Given his age and his recent surgery, I would proceed with systemic therapy first. Risks and benefits of Votrient discussed. Written information given as well. Chemotherapy education class will be offered as well.  I will start this in the near future after staging CT scan and repeat labs.   He will follow up in one month.      Eli Hose, MD 5/20/201412:24 PM

## 2013-05-09 NOTE — Telephone Encounter (Signed)
Gave pt appt for lab,Md, chemo class and CT, for may and June 2014 , pt sent to labs today

## 2013-05-09 NOTE — Progress Notes (Signed)
Faxed votrient prescription to [facility] °

## 2013-05-10 ENCOUNTER — Other Ambulatory Visit: Payer: Medicare Other

## 2013-05-10 ENCOUNTER — Encounter: Payer: Self-pay | Admitting: *Deleted

## 2013-05-11 ENCOUNTER — Other Ambulatory Visit: Payer: Self-pay | Admitting: *Deleted

## 2013-05-11 NOTE — Progress Notes (Signed)
Fax rec'd from [facility] that Rx for Votrient will be transferred to [facility]. Phone-[phone] Fax-[phone]

## 2013-05-16 ENCOUNTER — Encounter (HOSPITAL_COMMUNITY): Payer: Self-pay

## 2013-05-16 ENCOUNTER — Ambulatory Visit (HOSPITAL_COMMUNITY)
Admission: RE | Admit: 2013-05-16 | Discharge: 2013-05-16 | Disposition: A | Discharge status: Home / Self Care | Payer: Medicare Other | Source: Ambulatory Visit | Attending: Oncology | Admitting: Oncology

## 2013-05-16 DIAGNOSIS — J8410 Pulmonary fibrosis, unspecified: Secondary | ICD-10-CM | POA: Insufficient documentation

## 2013-05-16 DIAGNOSIS — I2510 Atherosclerotic heart disease of native coronary artery without angina pectoris: Secondary | ICD-10-CM | POA: Insufficient documentation

## 2013-05-16 DIAGNOSIS — R188 Other ascites: Secondary | ICD-10-CM | POA: Diagnosis not present

## 2013-05-16 DIAGNOSIS — N289 Disorder of kidney and ureter, unspecified: Secondary | ICD-10-CM | POA: Diagnosis not present

## 2013-05-16 DIAGNOSIS — C189 Malignant neoplasm of colon, unspecified: Secondary | ICD-10-CM

## 2013-05-16 DIAGNOSIS — R599 Enlarged lymph nodes, unspecified: Secondary | ICD-10-CM | POA: Diagnosis not present

## 2013-05-16 DIAGNOSIS — C649 Malignant neoplasm of unspecified kidney, except renal pelvis: Secondary | ICD-10-CM | POA: Insufficient documentation

## 2013-05-16 DIAGNOSIS — I517 Cardiomegaly: Secondary | ICD-10-CM | POA: Insufficient documentation

## 2013-05-16 DIAGNOSIS — C7800 Secondary malignant neoplasm of unspecified lung: Secondary | ICD-10-CM | POA: Insufficient documentation

## 2013-05-16 DIAGNOSIS — Z85038 Personal history of other malignant neoplasm of large intestine: Secondary | ICD-10-CM | POA: Diagnosis not present

## 2013-05-16 HISTORY — DX: Malignant neoplasm of colon, unspecified: C18.9

## 2013-05-16 HISTORY — DX: Malignant (primary) neoplasm, unspecified: C80.1

## 2013-05-16 HISTORY — DX: Secondary malignant neoplasm of unspecified lung: C78.00

## 2013-05-16 MED ORDER — IOHEXOL 300 MG/ML  SOLN
80.0000 mL | Freq: Once | INTRAMUSCULAR | Status: AC | PRN
  Administered 2013-05-16: 80 mL via INTRAVENOUS

## 2013-05-17 ENCOUNTER — Telehealth: Payer: Self-pay | Admitting: *Deleted

## 2013-05-17 ENCOUNTER — Encounter: Payer: Self-pay | Admitting: *Deleted

## 2013-05-17 DIAGNOSIS — N400 Enlarged prostate without lower urinary tract symptoms: Secondary | ICD-10-CM | POA: Diagnosis not present

## 2013-05-17 NOTE — Telephone Encounter (Signed)
Patient presents to office to inquire when he will be getting his chemo pills? Called [facility] and confirmed they have been trying to reach him since 05/11/13. Co pay is $17/month. Gave patient phone # for [facility] to call to arrange for his delivery. Instructed him to call office when drug is received so start date can be documented and nurse can review directions with him.

## 2013-05-21 ENCOUNTER — Encounter: Payer: Self-pay | Admitting: Family Medicine

## 2013-05-21 DIAGNOSIS — C649 Malignant neoplasm of unspecified kidney, except renal pelvis: Secondary | ICD-10-CM | POA: Insufficient documentation

## 2013-05-22 ENCOUNTER — Telehealth: Payer: Self-pay | Admitting: *Deleted

## 2013-05-22 NOTE — Telephone Encounter (Signed)
Patient received Votrient on 05-19-2013.  Asked how to take medicine.  Printed medication list and reviewed how to take on a empty stomach.  Reports he will start this this evening as he has enough pills to take in the mornings.

## 2013-05-24 ENCOUNTER — Other Ambulatory Visit: Payer: Self-pay | Admitting: Family Medicine

## 2013-05-24 ENCOUNTER — Other Ambulatory Visit: Payer: Self-pay | Admitting: *Deleted

## 2013-05-24 DIAGNOSIS — C189 Malignant neoplasm of colon, unspecified: Secondary | ICD-10-CM

## 2013-05-24 DIAGNOSIS — C649 Malignant neoplasm of unspecified kidney, except renal pelvis: Secondary | ICD-10-CM

## 2013-05-24 MED ORDER — PAZOPANIB HCL 200 MG PO TABS: 800.0000 mg | ORAL_TABLET | Freq: Every day | ORAL | Status: DC

## 2013-06-13 ENCOUNTER — Encounter: Payer: Self-pay | Admitting: Oncology

## 2013-06-13 ENCOUNTER — Telehealth: Payer: Self-pay | Admitting: Oncology

## 2013-06-13 ENCOUNTER — Ambulatory Visit (HOSPITAL_BASED_OUTPATIENT_CLINIC_OR_DEPARTMENT_OTHER): Payer: Medicare Other | Admitting: Oncology

## 2013-06-13 ENCOUNTER — Other Ambulatory Visit (HOSPITAL_BASED_OUTPATIENT_CLINIC_OR_DEPARTMENT_OTHER): Payer: Medicare Other | Admitting: Lab

## 2013-06-13 VITALS — BP 145/60 | HR 56 | Temp 97.0°F | Resp 20 | Ht 67.0 in | Wt 175.6 lb

## 2013-06-13 DIAGNOSIS — C189 Malignant neoplasm of colon, unspecified: Secondary | ICD-10-CM | POA: Diagnosis not present

## 2013-06-13 DIAGNOSIS — C649 Malignant neoplasm of unspecified kidney, except renal pelvis: Secondary | ICD-10-CM

## 2013-06-13 DIAGNOSIS — E46 Unspecified protein-calorie malnutrition: Secondary | ICD-10-CM | POA: Diagnosis not present

## 2013-06-13 HISTORY — DX: Unspecified protein-calorie malnutrition: E46

## 2013-06-13 LAB — COMPREHENSIVE METABOLIC PANEL (CC13)
ALT: 32 U/L (ref 0–55)
AST: 35 U/L — ABNORMAL HIGH (ref 5–34)
Calcium: 9.8 mg/dL (ref 8.4–10.4)
Chloride: 99 mEq/L (ref 98–107)
Creatinine: 1.2 mg/dL (ref 0.7–1.3)
Sodium: 136 mEq/L (ref 136–145)
Total Protein: 7.1 g/dL (ref 6.4–8.3)

## 2013-06-13 LAB — CBC WITH DIFFERENTIAL/PLATELET
BASO%: 0.5 % (ref 0.0–2.0)
EOS%: 3.7 % (ref 0.0–7.0)
HCT: 45.3 % (ref 38.4–49.9)
MCH: 27.6 pg (ref 27.2–33.4)
MCHC: 34.1 g/dL (ref 32.0–36.0)
MONO#: 0.5 10*3/uL (ref 0.1–0.9)
NEUT%: 48.4 % (ref 39.0–75.0)
RBC: 5.6 10*6/uL (ref 4.20–5.82)
RDW: 17.8 % — ABNORMAL HIGH (ref 11.0–14.6)
WBC: 7.7 10*3/uL (ref 4.0–10.3)
lymph#: 3.1 10*3/uL (ref 0.9–3.3)

## 2013-06-13 MED ORDER — ONDANSETRON HCL 8 MG PO TABS: 8.0000 mg | ORAL_TABLET | Freq: Three times a day (TID) | ORAL | Status: DC | PRN

## 2013-06-13 NOTE — Patient Instructions (Addendum)
Nausea: Zofran (ondansetron) 8 mg every 8 hrs as needed. This is more preventative for chemotherapy induced nausea and also non sedating.   If your nausea is not controlled by this, please call our office.   For diarrhea, you may use Imodium. Take 2 tabs after the first loose stool then 1 tab after each additional loose stool. Maximum of 8 tabs in 24 hours.

## 2013-06-13 NOTE — Progress Notes (Signed)
Hematology and Oncology Follow Up Visit  Hunter Hardin [id number] 1935/02/20 78 y.o. 06/13/2013 9:44 AM   Principle Diagnosis: 78 year old with: 1. T3 N0 colon cancer: He is status post a laparoscopic right hemicolectomy, with 0 out of 21 lymph nodes involved. That was done on 03/06/2013. 2. Kidney mass and lung lesions. Work up is ongoing.   Current therapy: Started Votrient 800 mg daily on 05/22/2013   Interim History: [name] presents today for a follow up visit. He is a nice man initially seen on 03/14/2013 for the above issues. He reports he developed a wound infection from his recent colectomy surgery but is healing very well. The area is beginning to scab over. He started Votrient beginning of this month. He developed vomiting for 2 days about one week after beginning the medication but that has now resolved. He had some intermittent nausea will about 1 week ago. This has now resolved as well. Reports having loose stools about 2-3 times a day. Appetite is fair, but he has lost weight. He is drinking about one can of Ensure per day. Denies chest pain, shortness of breath, dyspnea on exertion. Denies abdominal pain. He does not report any hematochezia. Performance status and activity level are actually improving and getting close to baseline.    Medications: I have reviewed the patient's current medications.  Current Outpatient Prescriptions  Medication Sig Dispense Refill  . atorvastatin (LIPITOR) 10 MG tablet Take 10 mg by mouth at bedtime.      [name] doxazosin (CARDURA) 8 MG tablet TAKE 1 TABLET BY MOUTH DAILY  90 tablet  0  . fish oil-omega-3 fatty acids 1000 MG capsule Take 1 g by mouth 2 (two) times daily.      . hydrochlorothiazide (HYDRODIURIL) 25 MG tablet TAKE 1 TABLET BY MOUTH DAILY  90 tablet  1  . lactose free nutrition (BOOST) LIQD Take 237 mLs by mouth daily.      [name] levothyroxine (SYNTHROID, LEVOTHROID) 50 MCG tablet Take 50-75 mcg by mouth every evening. Takes 1  tablet Monday- Saturday then takes 1 and 1/2 tablet on sunday      . ondansetron (ZOFRAN) 8 MG tablet Take 1 tablet (8 mg total) by mouth every 8 (eight) hours as needed for nausea.  20 tablet  2  . pazopanib (VOTRIENT) 200 MG tablet Take 4 tablets (800 mg total) by mouth daily. Take on an empty stomach.  120 tablet  0  . polyethylene glycol (MIRALAX / GLYCOLAX) packet Take 17 g by mouth daily.      . propranolol (INDERAL) 40 MG tablet Take 40 mg by mouth 2 (two) times daily.      . verapamil (COVERA HS) 240 MG (CO) 24 hr tablet Take 240 mg by mouth at bedtime.      . vitamin C (ASCORBIC ACID) 500 MG tablet Take 500 mg by mouth daily.       No current facility-administered medications for this visit.    Allergies: No Known Allergies  Past Medical History, Surgical history, Social history, and Family History were reviewed and updated.  Review of Systems: Constitutional:  Negative for fever, chills, night sweats, anorexia, pain. Cardiovascular: negative Respiratory: negative Neurological: negative Dermatological: negative ENT: negative Skin: Negative. Gastrointestinal: negative Genito-Urinary: negative Hematological and Lymphatic: negative Breast: negative Musculoskeletal: negative Remaining ROS negative.  Physical Exam: Blood pressure 145/60, pulse 56, temperature 97 F (36.1 C), temperature source Oral, resp. rate 20, height 5\' 7"  (1.702 m), weight 175 lb  9.6 oz (79.652 kg). ECOG: 1 General appearance: alert Head: Normocephalic, without obvious abnormality, atraumatic Neck: no adenopathy, no carotid bruit, no JVD, supple, symmetrical, trachea midline and thyroid not enlarged, symmetric, no tenderness/mass/nodules Lymph nodes: Cervical, supraclavicular, and axillary nodes normal. Heart:regular rate and rhythm, S1, S2 normal, no murmur, click, rub or gallop Lung:chest clear, no wheezing, rales, normal symmetric air entry Abdomen: soft, non-tender, without masses or  organomegaly EXT:no erythema, induration, or nodules   Lab Results: Lab Results  Component Value Date   WBC 7.7 06/13/2013   HGB 15.4 06/13/2013   HCT 45.3 06/13/2013   MCV 80.9 06/13/2013   PLT 86* 06/13/2013     Chemistry      Component Value Date/Time   NA 136 06/13/2013 0747   NA 134* 03/11/2013 0545   K 3.9 06/13/2013 0747   K 3.6 03/11/2013 0545   CL 99 06/13/2013 0747   CL 100 03/11/2013 0545   CO2 27 06/13/2013 0747   CO2 26 03/11/2013 0545   BUN 22.7 06/13/2013 0747   BUN 15 03/11/2013 0545   CREATININE 1.2 06/13/2013 0747   CREATININE 1.13 03/11/2013 0545      Component Value Date/Time   CALCIUM 9.8 06/13/2013 0747   CALCIUM 8.9 03/11/2013 0545   ALKPHOS 65 06/13/2013 0747   ALKPHOS 46 01/26/2013 1002   AST 35* 06/13/2013 0747   AST 22 01/26/2013 1002   ALT 32 06/13/2013 0747   ALT 24 01/26/2013 1002   BILITOT 0.96 06/13/2013 0747   BILITOT 0.9 01/26/2013 1002     Imaging:  *RADIOLOGY REPORT*  Clinical Data: Metastatic renal cell carcinoma to lungs. History  of colon cancer.  CT CHEST WITH CONTRAST  Technique: Multidetector CT imaging of the chest was performed  following the standard protocol during bolus administration of  intravenous contrast.  Contrast: 80mL OMNIPAQUE IOHEXOL 300 MG/ML SOLN  Comparison: 02/27/2013 and PET of 02/08/2013  Findings: Lungs/pleura: Secretions within the dependent trachea.  Redemonstration of bilateral lung nodules. Index left lower lobe  nodule measures 2.0 x 1.9 cm on image 36/series 5 versus 1.7 x 1.4  cm at the same level on the prior.  Immediately adjacent left lower lobe nodule measures 1.4 x 1.2 cm  on image 37/series 5 versus 1.1 x1.4 cm on the prior exam.  Index central right lower lobe nodule measures 1.4 cm on image 33  versus 1.3 cm on the prior exam.  Similar appearance of the subpleural fibrosis with minimal lower  lobe predominance. No honeycombing. Scattered areas of mild  architectural distortion. Trace bilateral pleural  effusions are  similar.  Heart/Mediastinum: No supraclavicular adenopathy. Mild  cardiomegaly, without pericardial or pleural effusion. Multivessel  coronary artery atherosclerosis. No central pulmonary embolism, on  this non-dedicated study.  Heterogeneous subcarinal nodal mass measures 4.2 x 2.3 cm versus  4.0 x 1.7 cm on the prior exam.  Para esophageal node is unchanged 1.6 cm.  Right hilar nodal mass measures 3.9 by 2.9 cm versus 3.8 x 2.5 on  the prior exam.  Upper abdomen: Trace perihepatic ascites on image 50/series 2, new.  Incompletely imaged upper pole left renal mass. Right upper  quadrant ill-defined edema on image 62/series 2. Probable  postoperative edema within the anterior abdominal wall,  incompletely imaged. Right-sided colonic surgical sutures.  Bones/Musculoskeletal: No acute osseous abnormality.  IMPRESSION:  1. Mild progression of pulmonary parenchymal and nodal metastasis.  2. Similar trace bilateral pleural effusions.  3. Interstitial lung disease, likely nonspecific interstitial  pneumonitis, unchanged.  4. Cardiomegaly with coronary artery atherosclerosis.  5. Small volume of prior abdominal ascites and edema. Presumably  postoperative, given surgical sutures in the region of the right  side colon.  Original Report Authenticated By: Jeronimo Greaves, M.D.  Impression and Plan:  A 78 year old gentleman with the following issues.  1. Diagnosis of a colon cancer: He is status post a laparoscopic right hemicolectomy, with the pathology revealing a T3 N0, with 0 out of 21 lymph nodes involved. He probably will not receive adjuvant chemotherapy for colon cancer given that he is likely has stage IV kidney cancer.   2. Kidney mass and lung lesions. Biopsy results discussed with Dr. Dierdre Searles the reviewing pathologist. It appears that he has stage IV kidney cancer. He is currently on Votrient with grade 1 nausea and grade 1 diarrhea. Recommend that he continue Votrient without  dose modification.  3. Nausea. This seems to control this time, but I have given a prescription for Zofran to have at home to use as needed.  4. Diarrhea. Due to prior hemicolectomy versus Votrient. I have advised him to use Imodium as needed.  5. Protein calorie malnutrition. I have advised him to increase his Ensure intake to at least 2 cans per day. I have referred him to the dietitian.  6. Thrombocytopenia. Due to Votrient. He has no active bleeding. No dose modification as needed at this time.  He will follow up in one month.      [place], [place] 6/24/20149:44 AM

## 2013-06-13 NOTE — Telephone Encounter (Signed)
gv and printed appt sched and avs for pt  °

## 2013-06-19 NOTE — Progress Notes (Signed)
Patient called inquiring a refill on Votrient; patient states that he never received a call from the pharmacy; after speaking to a representative at [facility] [phone]), confimed that patient has not received Rx dated for 05/24/13 because they were not able to reach him by phone; explained to patient and patient support # given to him [phone]); patient states that he will call pharmacy today to have Rx delivered.

## 2013-06-26 ENCOUNTER — Ambulatory Visit: Payer: Medicare Other | Admitting: Nutrition

## 2013-06-26 ENCOUNTER — Encounter (INDEPENDENT_AMBULATORY_CARE_PROVIDER_SITE_OTHER): Payer: Self-pay | Admitting: General Surgery

## 2013-06-26 ENCOUNTER — Ambulatory Visit (INDEPENDENT_AMBULATORY_CARE_PROVIDER_SITE_OTHER): Payer: Medicare Other | Admitting: General Surgery

## 2013-06-26 VITALS — BP 118/68 | HR 54 | Temp 96.3°F | Ht 66.5 in | Wt 170.2 lb

## 2013-06-26 DIAGNOSIS — Z9889 Other specified postprocedural states: Secondary | ICD-10-CM

## 2013-06-26 NOTE — Progress Notes (Signed)
Hunter Hardin. is a 78 y.o. male who is status post a lap R coloectomy on 3/17.  This was complicated by a small wound infection.  This seems to be healing well.  His cancer was a T3N0, but he also had a renal mass found on workup.  His lung masses also appear to be from his RCC.  He is undergoing chemo for this now.  He denies much diarrhea or constipation.    Objective: Filed Vitals:   06/26/13 0930  BP: 118/68  Pulse: 54  Temp: 96.3 F (35.7 C)    General appearance: alert and cooperative Resp: clear to auscultation bilaterally Cardio: regular rate and rhythm GI: soft, non-tender; bowel sounds normal; no masses,  no organomegaly  Incision: healing well, slight wound at midline with scab   Assessment: s/p  Patient Active Problem List   Diagnosis Date Noted  . Protein calorie malnutrition 06/13/2013  . Renal cell cancer 05/21/2013  . Colon cancer 03/07/2013  . Preoperative evaluation to rule out surgical contraindication 03/03/2013  . Medicare annual wellness visit, initial 11/04/2012  . HYPERGLYCEMIA 05/01/2007  . HYPOTHYROIDISM 04/29/2007  . HYPERLIPIDEMIA 04/29/2007  . HYPERTENSION 04/29/2007  . ELEVATED PROSTATE SPECIFIC ANTIGEN 04/29/2007    Plan: Doing well after colectomy.  Currently getting chemo for metastatic RCC.  Will continue to follow for colon cancer.  RTO in 3 mo.  Will need colonoscopy next Spring.      Vanita Panda, MD [facility], [place] [phone]   06/26/2013 9:48 AM

## 2013-06-26 NOTE — Patient Instructions (Addendum)
Return to the office in 6 months 

## 2013-06-26 NOTE — Progress Notes (Signed)
Nutrition Note  Pt is a 78 year old male status post a laparoscopic right hemicolectomy. He is currently undergoing chemo for a renal mass and lung masses.   PMH: colon cancer, renal cell cancer, hypertension, hyperlipidemia  Medications Include: lipitor, cardura, fish oil, hydrodiuril, synthroid, zofran, votrient, inderal, covera, vitamin C  Labs Include: PLT 86, NA 134, AST 35  Ht: 5'6.5'' Wt: 170 lbs UBW: 201 lbs BMI: 27  Pt reports that he has lost weight steadily since February 2014. He is currently drinking one Boost most days. He complains of alterations in taste, especially when consuming meats. He denies nausea, vomiting, and diarrhea. He says that his appetite has been good, and that he has not been skipping meals.  Nutritional Diagnosis: Inadequate oral intake related to cancer as evidenced by weight loss of 30 lbs in 5 months.  Intervention: 1- Supplied pt with samples of Ensure Plus and coupons. Recommended that pt consume 2-3 daily instead of 1 on most days. 2- Educated pt on taste alterations and foods that may be better tolerated. Also educated pt on how to increase calories and protein in diet. Used teach-back method and supplied pt with handouts.  Monitor: Weight, taste alteration, consumption of supplements, labs  Follow-up on next visit to [facility].  Ebbie Latus RD, LDN

## 2013-06-27 ENCOUNTER — Other Ambulatory Visit: Payer: Self-pay | Admitting: *Deleted

## 2013-06-27 NOTE — Telephone Encounter (Signed)
THIS REFILL REQUEST FOR VOTRIENT WAS PLACED IN DR.SHADAD'S ACTIVE WORK FOLDER. 

## 2013-06-28 ENCOUNTER — Other Ambulatory Visit: Payer: Self-pay | Admitting: *Deleted

## 2013-06-28 DIAGNOSIS — C649 Malignant neoplasm of unspecified kidney, except renal pelvis: Secondary | ICD-10-CM

## 2013-06-28 DIAGNOSIS — C189 Malignant neoplasm of colon, unspecified: Secondary | ICD-10-CM

## 2013-06-30 ENCOUNTER — Other Ambulatory Visit: Payer: Self-pay | Admitting: *Deleted

## 2013-06-30 DIAGNOSIS — C189 Malignant neoplasm of colon, unspecified: Secondary | ICD-10-CM

## 2013-06-30 DIAGNOSIS — C649 Malignant neoplasm of unspecified kidney, except renal pelvis: Secondary | ICD-10-CM

## 2013-06-30 MED ORDER — PAZOPANIB HCL 200 MG PO TABS: 800.0000 mg | ORAL_TABLET | Freq: Every day | ORAL | Status: DC

## 2013-06-30 NOTE — Telephone Encounter (Signed)
THIS REFILL REQUEST FOR VOTRIENT WAS PLACED IN DR.SHADAD'S ACTIVE WORK FOLDER. 

## 2013-06-30 NOTE — Addendum Note (Signed)
Addended by: Arvilla Meres on: 06/30/2013 02:30 PM   Modules accepted: Orders

## 2013-07-13 DIAGNOSIS — H5200 Hypermetropia, unspecified eye: Secondary | ICD-10-CM | POA: Diagnosis not present

## 2013-07-13 DIAGNOSIS — H52229 Regular astigmatism, unspecified eye: Secondary | ICD-10-CM | POA: Diagnosis not present

## 2013-07-13 DIAGNOSIS — H2510 Age-related nuclear cataract, unspecified eye: Secondary | ICD-10-CM | POA: Diagnosis not present

## 2013-07-13 DIAGNOSIS — H524 Presbyopia: Secondary | ICD-10-CM | POA: Diagnosis not present

## 2013-07-14 ENCOUNTER — Ambulatory Visit (HOSPITAL_BASED_OUTPATIENT_CLINIC_OR_DEPARTMENT_OTHER): Payer: Medicare Other | Admitting: Oncology

## 2013-07-14 ENCOUNTER — Other Ambulatory Visit (HOSPITAL_BASED_OUTPATIENT_CLINIC_OR_DEPARTMENT_OTHER): Payer: Medicare Other | Admitting: Lab

## 2013-07-14 ENCOUNTER — Telehealth: Payer: Self-pay | Admitting: Oncology

## 2013-07-14 VITALS — BP 150/71 | HR 56 | Temp 97.3°F | Resp 18 | Ht 66.0 in | Wt 160.5 lb

## 2013-07-14 DIAGNOSIS — C649 Malignant neoplasm of unspecified kidney, except renal pelvis: Secondary | ICD-10-CM

## 2013-07-14 LAB — CBC WITH DIFFERENTIAL/PLATELET
BASO%: 0.2 % (ref 0.0–2.0)
EOS%: 2.3 % (ref 0.0–7.0)
HCT: 44.4 % (ref 38.4–49.9)
LYMPH%: 50.8 % — ABNORMAL HIGH (ref 14.0–49.0)
MCH: 28 pg (ref 27.2–33.4)
MCHC: 33.9 g/dL (ref 32.0–36.0)
NEUT%: 39.4 % (ref 39.0–75.0)
Platelets: 91 10*3/uL — ABNORMAL LOW (ref 140–400)
RBC: 5.39 10*6/uL (ref 4.20–5.82)
lymph#: 4.1 10*3/uL — ABNORMAL HIGH (ref 0.9–3.3)

## 2013-07-14 LAB — COMPREHENSIVE METABOLIC PANEL (CC13)
ALT: 26 U/L (ref 0–55)
AST: 25 U/L (ref 5–34)
Creatinine: 1.2 mg/dL (ref 0.7–1.3)
Total Bilirubin: 0.96 mg/dL (ref 0.20–1.20)

## 2013-07-14 MED ORDER — MEGESTROL ACETATE 400 MG/10ML PO SUSP: 400.0000 mg | Freq: Four times a day (QID) | ORAL | Status: DC

## 2013-07-14 NOTE — Telephone Encounter (Signed)
Gave pt appt for lab, MD and chemo for August and September 2014 °

## 2013-07-14 NOTE — Progress Notes (Signed)
Hematology and Oncology Follow Up Visit  Hunter Hardin [id number] [birth year] 78 y.o. 07/14/2013 8:43 AM   Principle Diagnosis: 78 year old with: 1. T3 N0 colon cancer: He is status post a laparoscopic right hemicolectomy, with 0 out of 21 lymph nodes involved. That was done on 03/06/2013. 2. Kidney mass and lung lesions. Work up is ongoing.   Current therapy: Started Votrient 800 mg daily on 05/22/2013   Interim History: Mr. Lycan presents today for a follow up visit. He is a nice man initially seen on 03/14/2013 for the above issues. . He has been on Votrient for bout 2 months and tolerated well now. He developed vomiting for 2 days about one week after beginning the medication but that has now resolved. He had some intermittent nausea which also rsolved.  Reports having loose stools about 2-3 times a day. Appetite is still poor and have lost weight. He is drinking about one can of Ensure per day. Denies chest pain, shortness of breath, dyspnea on exertion. Denies abdominal pain. He does not report any hematochezia. Performance status and activity level are actually improving and getting close to baseline.    Medications: I have reviewed the patient's current medications.  Current Outpatient Prescriptions  Medication Sig Dispense Refill  . atorvastatin (LIPITOR) 10 MG tablet Take 10 mg by mouth at bedtime.      [name] doxazosin (CARDURA) 8 MG tablet TAKE 1 TABLET BY MOUTH DAILY  90 tablet  0  . fish oil-omega-3 fatty acids 1000 MG capsule Take 1 g by mouth 2 (two) times daily.      . hydrochlorothiazide (HYDRODIURIL) 25 MG tablet TAKE 1 TABLET BY MOUTH DAILY  90 tablet  1  . lactose free nutrition (BOOST) LIQD Take 237 mLs by mouth daily.      [name] levothyroxine (SYNTHROID, LEVOTHROID) 50 MCG tablet Take 50-75 mcg by mouth every evening. Takes 1 tablet Monday- Saturday then takes 1 and 1/2 tablet on sunday      . megestrol (MEGACE) 400 MG/10ML suspension Take 10 mLs (400 mg total) by mouth 4  (four) times daily.  240 mL  1  . ondansetron (ZOFRAN) 8 MG tablet Take 1 tablet (8 mg total) by mouth every 8 (eight) hours as needed for nausea.  20 tablet  2  . pazopanib (VOTRIENT) 200 MG tablet Take 4 tablets (800 mg total) by mouth daily. Take on an empty stomach.  120 tablet  0  . polyethylene glycol (MIRALAX / GLYCOLAX) packet Take 17 g by mouth daily.      . propranolol (INDERAL) 40 MG tablet Take 40 mg by mouth 2 (two) times daily.      . verapamil (COVERA HS) 240 MG (CO) 24 hr tablet Take 240 mg by mouth at bedtime.      . vitamin C (ASCORBIC ACID) 500 MG tablet Take 500 mg by mouth daily.       No current facility-administered medications for this visit.    Allergies: No Known Allergies  Past Medical History, Surgical history, Social history, and Family History were reviewed and updated.  Review of Systems: Constitutional:  Negative for fever, chills, night sweats, anorexia, pain. Cardiovascular: negative Respiratory: negative Neurological: negative Dermatological: negative ENT: negative Skin: Negative. Gastrointestinal: negative Genito-Urinary: negative Hematological and Lymphatic: negative Breast: negative Musculoskeletal: negative Remaining ROS negative.  Physical Exam: Blood pressure 150/71, pulse 56, temperature 97.3 F (36.3 C), temperature source Oral, resp. rate 18, height 5\' 6"  (1.676 m), weight 160 lb  8 oz (72.802 kg). ECOG: 1 General appearance: alert Head: Normocephalic, without obvious abnormality, atraumatic Neck: no adenopathy, no carotid bruit, no JVD, supple, symmetrical, trachea midline and thyroid not enlarged, symmetric, no tenderness/mass/nodules Lymph nodes: Cervical, supraclavicular, and axillary nodes normal. Heart:regular rate and rhythm, S1, S2 normal, no murmur, click, rub or gallop Lung:chest clear, no wheezing, rales, normal symmetric air entry Abdomen: soft, non-tender, without masses or organomegaly EXT:no erythema, induration, or  nodules   Lab Results: Lab Results  Component Value Date   WBC 8.0 07/14/2013   HGB 15.1 07/14/2013   HCT 44.4 07/14/2013   MCV 82.5 07/14/2013   PLT 91* 07/14/2013     Chemistry      Component Value Date/Time   NA 136 06/13/2013 0747   NA 134* 03/11/2013 0545   K 3.9 06/13/2013 0747   K 3.6 03/11/2013 0545   CL 99 06/13/2013 0747   CL 100 03/11/2013 0545   CO2 27 06/13/2013 0747   CO2 26 03/11/2013 0545   BUN 22.7 06/13/2013 0747   BUN 15 03/11/2013 0545   CREATININE 1.2 06/13/2013 0747   CREATININE 1.13 03/11/2013 0545      Component Value Date/Time   CALCIUM 9.8 06/13/2013 0747   CALCIUM 8.9 03/11/2013 0545   ALKPHOS 65 06/13/2013 0747   ALKPHOS 46 01/26/2013 1002   AST 35* 06/13/2013 0747   AST 22 01/26/2013 1002   ALT 32 06/13/2013 0747   ALT 24 01/26/2013 1002   BILITOT 0.96 06/13/2013 0747   BILITOT 0.9 01/26/2013 1002      Impression and Plan:  A 78 year old gentleman with the following issues.  1. Diagnosis of a colon cancer: He is status post a laparoscopic right hemicolectomy, with the pathology revealing a T3 N0, with 0 out of 21 lymph nodes involved. He probably will not receive adjuvant chemotherapy for colon cancer given that he is likely has stage IV kidney cancer.   2. Kidney mass and lung lesions. Biopsy results suggested stage IV kidney cancer. He is currently on Votrient with grade 1 nausea and grade 1 diarrhea. Recommend that he continue Votrient without dose modification. I will repeat CT scan in one month.   3. Nausea. This seems to control this time, but I have given a prescription for Zofran to have at home to use as needed.  4. Diarrhea. Due to prior hemicolectomy versus Votrient. I have advised him to use Imodium as needed.  5. Protein calorie malnutrition. I have advised him to increase his Ensure intake to at least 3 cans per day. I gave Rx for Megace.  6. Thrombocytopenia. Due to Votrient. He has no active bleeding. No dose modification as needed at this  time.       Areon Cocuzza 7/25/20148:43 AM

## 2013-07-26 ENCOUNTER — Encounter: Payer: Self-pay | Admitting: Family Medicine

## 2013-07-27 ENCOUNTER — Other Ambulatory Visit: Payer: Self-pay | Admitting: *Deleted

## 2013-07-27 DIAGNOSIS — C189 Malignant neoplasm of colon, unspecified: Secondary | ICD-10-CM

## 2013-07-27 DIAGNOSIS — C649 Malignant neoplasm of unspecified kidney, except renal pelvis: Secondary | ICD-10-CM

## 2013-07-27 MED ORDER — PAZOPANIB HCL 200 MG PO TABS: 800.0000 mg | ORAL_TABLET | Freq: Every day | ORAL | Status: DC

## 2013-08-03 ENCOUNTER — Other Ambulatory Visit: Payer: Self-pay | Admitting: Family Medicine

## 2013-08-03 NOTE — Telephone Encounter (Signed)
Electronic refill request.  Patient has not been seen by you in some time but is apparently battling cancer.  Please advise.

## 2013-08-03 NOTE — Telephone Encounter (Signed)
rx sent.  Thanks.  

## 2013-08-18 ENCOUNTER — Ambulatory Visit (HOSPITAL_COMMUNITY)
Admission: RE | Admit: 2013-08-18 | Discharge: 2013-08-18 | Disposition: A | Discharge status: Home / Self Care | Payer: Medicare Other | Source: Ambulatory Visit | Attending: Oncology | Admitting: Oncology

## 2013-08-18 ENCOUNTER — Encounter (HOSPITAL_COMMUNITY): Payer: Self-pay

## 2013-08-18 ENCOUNTER — Other Ambulatory Visit (HOSPITAL_BASED_OUTPATIENT_CLINIC_OR_DEPARTMENT_OTHER): Payer: Medicare Other | Admitting: Lab

## 2013-08-18 DIAGNOSIS — Z9049 Acquired absence of other specified parts of digestive tract: Secondary | ICD-10-CM | POA: Diagnosis not present

## 2013-08-18 DIAGNOSIS — I2510 Atherosclerotic heart disease of native coronary artery without angina pectoris: Secondary | ICD-10-CM | POA: Insufficient documentation

## 2013-08-18 DIAGNOSIS — N281 Cyst of kidney, acquired: Secondary | ICD-10-CM | POA: Diagnosis not present

## 2013-08-18 DIAGNOSIS — J8410 Pulmonary fibrosis, unspecified: Secondary | ICD-10-CM | POA: Insufficient documentation

## 2013-08-18 DIAGNOSIS — C787 Secondary malignant neoplasm of liver and intrahepatic bile duct: Secondary | ICD-10-CM | POA: Diagnosis not present

## 2013-08-18 DIAGNOSIS — J90 Pleural effusion, not elsewhere classified: Secondary | ICD-10-CM | POA: Insufficient documentation

## 2013-08-18 DIAGNOSIS — C7800 Secondary malignant neoplasm of unspecified lung: Secondary | ICD-10-CM | POA: Diagnosis not present

## 2013-08-18 DIAGNOSIS — C649 Malignant neoplasm of unspecified kidney, except renal pelvis: Secondary | ICD-10-CM

## 2013-08-18 LAB — COMPREHENSIVE METABOLIC PANEL (CC13)
ALT: 28 U/L (ref 0–55)
AST: 29 U/L (ref 5–34)
CO2: 22 mEq/L (ref 22–29)
Chloride: 103 mEq/L (ref 98–109)
Sodium: 135 mEq/L — ABNORMAL LOW (ref 136–145)
Total Bilirubin: 0.96 mg/dL (ref 0.20–1.20)
Total Protein: 6.9 g/dL (ref 6.4–8.3)

## 2013-08-18 LAB — CBC WITH DIFFERENTIAL/PLATELET
BASO%: 0.3 % (ref 0.0–2.0)
EOS%: 2.8 % (ref 0.0–7.0)
HCT: 41.4 % (ref 38.4–49.9)
LYMPH%: 51.2 % — ABNORMAL HIGH (ref 14.0–49.0)
MCH: 30.7 pg (ref 27.2–33.4)
MCHC: 34.6 g/dL (ref 32.0–36.0)
MCV: 88.8 fL (ref 79.3–98.0)
MONO#: 0.7 10*3/uL (ref 0.1–0.9)
NEUT%: 38.2 % — ABNORMAL LOW (ref 39.0–75.0)
Platelets: 171 10*3/uL (ref 140–400)

## 2013-08-18 LAB — TECHNOLOGIST REVIEW

## 2013-08-18 MED ORDER — IOHEXOL 300 MG/ML  SOLN
100.0000 mL | Freq: Once | INTRAMUSCULAR | Status: AC | PRN
  Administered 2013-08-18: 100 mL via INTRAVENOUS

## 2013-08-22 ENCOUNTER — Ambulatory Visit (HOSPITAL_BASED_OUTPATIENT_CLINIC_OR_DEPARTMENT_OTHER): Payer: Medicare Other | Admitting: Oncology

## 2013-08-22 ENCOUNTER — Telehealth: Payer: Self-pay | Admitting: Oncology

## 2013-08-22 ENCOUNTER — Other Ambulatory Visit: Payer: Self-pay | Admitting: *Deleted

## 2013-08-22 VITALS — BP 114/69 | HR 57 | Temp 97.5°F | Resp 18 | Ht 66.0 in | Wt 157.3 lb

## 2013-08-22 DIAGNOSIS — R197 Diarrhea, unspecified: Secondary | ICD-10-CM | POA: Diagnosis not present

## 2013-08-22 DIAGNOSIS — D696 Thrombocytopenia, unspecified: Secondary | ICD-10-CM | POA: Diagnosis not present

## 2013-08-22 DIAGNOSIS — C649 Malignant neoplasm of unspecified kidney, except renal pelvis: Secondary | ICD-10-CM

## 2013-08-22 DIAGNOSIS — J984 Other disorders of lung: Secondary | ICD-10-CM

## 2013-08-22 DIAGNOSIS — N289 Disorder of kidney and ureter, unspecified: Secondary | ICD-10-CM | POA: Diagnosis not present

## 2013-08-22 DIAGNOSIS — C189 Malignant neoplasm of colon, unspecified: Secondary | ICD-10-CM

## 2013-08-22 NOTE — Telephone Encounter (Signed)
THIS REFILL REQUEST FOR VOTRIENT WAS PLACED IN DR.SHADAD'S ACTIVE WORK FOLDER. 

## 2013-08-22 NOTE — Telephone Encounter (Signed)
gv and printed appt sched and avs for pt for OCT. °

## 2013-08-22 NOTE — Progress Notes (Signed)
Hematology and Oncology Follow Up Visit  Hunter Hardin [id number] 1935-05-17 78 y.o. 08/22/2013 8:31 AM   Principle Diagnosis: 78 year old with: 1. T3 N0 colon cancer: He is status post a laparoscopic right hemicolectomy, with 0 out of 21 lymph nodes involved. That was done on 03/06/2013. 2. Kidney mass and lung lesions. Work up is ongoing.   Current therapy: Started Votrient 800 mg daily on 05/22/2013   Interim History: Hunter Hardin presents today for a follow up visit. He has been on Votrient for bout 3 months and tolerated well now. He developed vomiting for 2 days about one week after beginning the medication but that has now reports no new issues. He had some intermittent nausea which also rsolved.  Reports having loose stools about 2-3 times a day. Appetite is better now. He is drinking about one can of Ensure per day. Denies chest pain, shortness of breath, dyspnea on exertion. Denies abdominal pain. He does not report any hematochezia. Performance status and activity level are actually improving and getting close to baseline.    Medications: I have reviewed the patient's current medications.  Current Outpatient Prescriptions  Medication Sig Dispense Refill  . atorvastatin (LIPITOR) 10 MG tablet Take 10 mg by mouth at bedtime.      [name] doxazosin (CARDURA) 8 MG tablet TAKE 1 TABLET DAILY  90 tablet  1  . fish oil-omega-3 fatty acids 1000 MG capsule Take 1 g by mouth 2 (two) times daily.      . hydrochlorothiazide (HYDRODIURIL) 25 MG tablet TAKE 1 TABLET BY MOUTH DAILY  90 tablet  1  . lactose free nutrition (BOOST) LIQD Take 237 mLs by mouth daily.      [name] levothyroxine (SYNTHROID, LEVOTHROID) 50 MCG tablet Take 50-75 mcg by mouth every evening. Takes 1 tablet Monday- Saturday then takes 1 and 1/2 tablet on sunday      . megestrol (MEGACE) 400 MG/10ML suspension Take 10 mLs (400 mg total) by mouth 4 (four) times daily.  240 mL  1  . ondansetron (ZOFRAN) 8 MG tablet Take 1 tablet (8  mg total) by mouth every 8 (eight) hours as needed for nausea.  20 tablet  2  . pazopanib (VOTRIENT) 200 MG tablet Take 4 tablets (800 mg total) by mouth daily. Take on an empty stomach.  120 tablet  0  . polyethylene glycol (MIRALAX / GLYCOLAX) packet Take 17 g by mouth daily.      . propranolol (INDERAL) 40 MG tablet Take 40 mg by mouth 2 (two) times daily.      . verapamil (COVERA HS) 240 MG (CO) 24 hr tablet Take 240 mg by mouth at bedtime.      . vitamin C (ASCORBIC ACID) 500 MG tablet Take 500 mg by mouth daily.       No current facility-administered medications for this visit.    Allergies: No Known Allergies  Past Medical History, Surgical history, Social history, and Family History were reviewed and updated.  Review of Systems:  Remaining ROS negative.  Physical Exam: Blood pressure 114/69, pulse 57, temperature 97.5 F (36.4 C), temperature source Oral, resp. rate 18, height 5\' 6"  (1.676 m), weight 157 lb 4.8 oz (71.351 kg). ECOG: 1 General appearance: alert Head: Normocephalic, without obvious abnormality, atraumatic Neck: no adenopathy, no carotid bruit, no JVD, supple, symmetrical, trachea midline and thyroid not enlarged, symmetric, no tenderness/mass/nodules Lymph nodes: Cervical, supraclavicular, and axillary nodes normal. Heart:regular rate and rhythm, S1, S2 normal, no  murmur, click, rub or gallop Lung:chest clear, no wheezing, rales, normal symmetric air entry Abdomen: soft, non-tender, without masses or organomegaly EXT:no erythema, induration, or nodules   Lab Results: Lab Results  Component Value Date   WBC 9.1 08/18/2013   HGB 14.3 08/18/2013   HCT 41.4 08/18/2013   MCV 88.8 08/18/2013   PLT 171 08/18/2013   CT CHEST, AND ABDOMEN WITH CONTRAST  Technique: Contiguous axial images of the chest and abdomen were  obtained after IV contrast administration.  Contrast: 100 ml Omnipaque-300  Comparison: Chest CT 05/16/2013. Abdominal pelvic CT 01/27/2013.  CT  CHEST  Findings: Lung windows demonstrate probable secretions in the right  side of the trachea. Normal motion degradation inferiorly.  Redemonstration of interstitial lung disease, with subpleural  reticulation. No definite cranial caudal distribution.  Marked improvement in pulmonary metastatic burden. For instance,  1.4 cm right lower lobe lung nodule on the prior exam has resolved.  2 adjacent left lower lobe lung nodules are improved. The more  lateral, measures 1.4 cm on image 63/series 6. Has central  cavitation today and is decreased in size from 2.0 cm on the prior.  No new or enlarging nodules are identified.  The superior segment left lower lobe nodule measures 6 mm on image  36 and is not significantly changed.  Soft tissue windows demonstrate no supraclavicular adenopathy.  Heart size upper normal, without pericardial effusion. Multivessel  coronary artery atherosclerosis. Trace bilateral pleural  effusions. No central pulmonary embolism, on this non-dedicated  study. Improvement in thoracic adenopathy. Subcarinal node  measures 1.5 cm on image 52 versus 2.3 cm on the prior.  A right hilar node measures 1.8 cm today versus 2.9 cm on the  prior. Improvement in left hilar adenopathy as well.  IMPRESSION:  1. Marked response to therapy of thoracic nodal and pulmonary  metastatic burden.  2. Interstitial lung disease, as before. 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  3. Similar trace bilateral pleural effusions.  CT ABDOMEN  Findings: No hypervascular lesions within the abdomen on arterial  phase images  Portal venous phase images demonstrate normal liver, spleen,  stomach. Edema about the descending duodenum and pancreatic  uncinate process. Soft tissue fullness in the region of the  uncinate process, measuring on the order of 2.3 cm on image  105/series 4. No pancreatic or biliary ductal dilatation.  Normal adrenal glands. Right renal cyst of 11 mm.  Upper pole  left renal mass measures 5.2 x 5.1 cm and demonstrates  markedly decreased enhancement. Similar in size to on the prior.  Left renal vein patent.  Aortic atherosclerosis. No retroperitoneal or retrocrural  adenopathy.  Partial right hemicolectomy. Normal abdominal small bowel, without  ascites.  No acute osseous abnormality.  IMPRESSION:  1. Response to therapy of left renal cell carcinoma, with similar  size and decreased enhancement.  2. Development of edema surrounding the descending duodenum and  pancreatic uncinate process. Soft tissue fullness within the  uncinate process. Favored to represent interval pancreatitis.  Duodenitis felt less likely. Correlate with pancreatic enzymes.  No pancreatic or biliary ductal dilatation to highly suggest  obstructive stone. If this is a concern, consider MRCP.    A 78 year old gentleman with the following issues.  1. Diagnosis of a colon cancer: He is status post a laparoscopic right hemicolectomy, with the pathology revealing a T3 N0, with 0 out of 21 lymph nodes involved. He probably will not receive adjuvant chemotherapy for colon cancer given that he is likely has  stage IV kidney cancer.   2. Kidney mass and lung lesions. Biopsy results suggested stage IV kidney cancer. He is currently on Votrient with grade 1 nausea and grade 1 diarrhea that have resolved now. CT scan from 8/29 was discussed and showed excellent response at this time.  Recommend that he continue Votrient without dose modification. I will repeat CT scan in 3 months.   3. Nausea. This seems to control this time.  4. Diarrhea. Due to prior hemicolectomy versus Votrient. I have advised him to use Imodium as needed.  5. Protein calorie malnutrition. I have advised him to increase his Ensure intake to at least 3 cans per day.   6. Thrombocytopenia. Due to Votrient, improved now.        Keliah Harned 9/2/20148:31 AM

## 2013-08-23 ENCOUNTER — Other Ambulatory Visit: Payer: Self-pay | Admitting: *Deleted

## 2013-08-23 NOTE — Telephone Encounter (Signed)
Rx for Votrient faxed to [facility].

## 2013-09-18 ENCOUNTER — Other Ambulatory Visit: Payer: Self-pay | Admitting: Family Medicine

## 2013-09-21 ENCOUNTER — Other Ambulatory Visit (HOSPITAL_BASED_OUTPATIENT_CLINIC_OR_DEPARTMENT_OTHER): Payer: Medicare Other | Admitting: Lab

## 2013-09-21 ENCOUNTER — Telehealth: Payer: Self-pay | Admitting: Oncology

## 2013-09-21 ENCOUNTER — Ambulatory Visit (HOSPITAL_BASED_OUTPATIENT_CLINIC_OR_DEPARTMENT_OTHER): Payer: Medicare Other | Admitting: Oncology

## 2013-09-21 VITALS — BP 127/80 | HR 59 | Temp 97.2°F | Resp 18 | Ht 66.0 in | Wt 153.8 lb

## 2013-09-21 DIAGNOSIS — D6959 Other secondary thrombocytopenia: Secondary | ICD-10-CM

## 2013-09-21 DIAGNOSIS — C189 Malignant neoplasm of colon, unspecified: Secondary | ICD-10-CM

## 2013-09-21 DIAGNOSIS — R110 Nausea: Secondary | ICD-10-CM | POA: Diagnosis not present

## 2013-09-21 DIAGNOSIS — C7800 Secondary malignant neoplasm of unspecified lung: Secondary | ICD-10-CM | POA: Diagnosis not present

## 2013-09-21 DIAGNOSIS — C649 Malignant neoplasm of unspecified kidney, except renal pelvis: Secondary | ICD-10-CM

## 2013-09-21 DIAGNOSIS — R197 Diarrhea, unspecified: Secondary | ICD-10-CM

## 2013-09-21 DIAGNOSIS — H353190 Nonexudative age-related macular degeneration, unspecified eye, stage unspecified: Secondary | ICD-10-CM | POA: Diagnosis not present

## 2013-09-21 DIAGNOSIS — I10 Essential (primary) hypertension: Secondary | ICD-10-CM | POA: Diagnosis not present

## 2013-09-21 DIAGNOSIS — H35039 Hypertensive retinopathy, unspecified eye: Secondary | ICD-10-CM | POA: Diagnosis not present

## 2013-09-21 DIAGNOSIS — H2510 Age-related nuclear cataract, unspecified eye: Secondary | ICD-10-CM | POA: Diagnosis not present

## 2013-09-21 LAB — CBC WITH DIFFERENTIAL/PLATELET
BASO%: 0.1 % (ref 0.0–2.0)
Basophils Absolute: 0 10*3/uL (ref 0.0–0.1)
EOS%: 2 % (ref 0.0–7.0)
HCT: 35.9 % — ABNORMAL LOW (ref 38.4–49.9)
LYMPH%: 59.6 % — ABNORMAL HIGH (ref 14.0–49.0)
MCH: 34.2 pg — ABNORMAL HIGH (ref 27.2–33.4)
MCHC: 34.5 g/dL (ref 32.0–36.0)
MCV: 99.2 fL — ABNORMAL HIGH (ref 79.3–98.0)
MONO%: 7.4 % (ref 0.0–14.0)
NEUT%: 30.9 % — ABNORMAL LOW (ref 39.0–75.0)
lymph#: 4.4 10*3/uL — ABNORMAL HIGH (ref 0.9–3.3)

## 2013-09-21 LAB — COMPREHENSIVE METABOLIC PANEL (CC13)
AST: 31 U/L (ref 5–34)
Albumin: 2.7 g/dL — ABNORMAL LOW (ref 3.5–5.0)
Alkaline Phosphatase: 60 U/L (ref 40–150)
BUN: 15 mg/dL (ref 7.0–26.0)
Creatinine: 0.9 mg/dL (ref 0.7–1.3)
Glucose: 90 mg/dl (ref 70–140)
Potassium: 3.9 mEq/L (ref 3.5–5.1)
Total Bilirubin: 1.1 mg/dL (ref 0.20–1.20)

## 2013-09-21 NOTE — Progress Notes (Signed)
Hematology and Oncology Follow Up Visit  Hunter Hardin [id number] Mar 16, 1935 78 y.o. 09/21/2013 3:57 PM   Principle Diagnosis: 78 year old with: 1. T3 N0 colon cancer: He is status post a laparoscopic right hemicolectomy, with 0 out of 21 lymph nodes involved. That was done on 03/06/2013. 2. Kidney mass and lung lesions. Work up is ongoing.   Current therapy: Started Votrient 800 mg daily on 05/22/2013   Interim History: Hunter Hardin presents today for a follow up visit. He has been on Votrient since 05/2013 and tolerated well now. He developed vomiting for 2 days about one week after beginning the medication but that has now reports no new issues. He had some intermittent nausea which also rsolved.  Reports having loose stools about 2-3 times a day. Appetite is better now. He is drinking about one can of Ensure per day. Denies chest pain, shortness of breath, dyspnea on exertion. Denies abdominal pain. He does not report any hematochezia. Performance status and activity level are actually improving and getting close to baseline. He has not reported any hospitalization or illnesses. He is reporting his appetite about the same but his weight has declined some. He reports that he rarely prepare his food at home and he eats adult frequently. He has not reported any pain in his hands or feet.   Medications: I have reviewed the patient's current medications.  Current Outpatient Prescriptions  Medication Sig Dispense Refill  . atorvastatin (LIPITOR) 10 MG tablet TAKE 1 TABLET DAILY  90 tablet  0  . doxazosin (CARDURA) 8 MG tablet TAKE 1 TABLET DAILY  90 tablet  1  . fish oil-omega-3 fatty acids 1000 MG capsule Take 1 g by mouth 2 (two) times daily.      . hydrochlorothiazide (HYDRODIURIL) 25 MG tablet TAKE 1 TABLET BY MOUTH DAILY  90 tablet  1  . lactose free nutrition (BOOST) LIQD Take 237 mLs by mouth daily.      [name] levothyroxine (SYNTHROID, LEVOTHROID) 50 MCG tablet Take 50-75 mcg by mouth  every evening. Takes 1 tablet Monday- Saturday then takes 1 and 1/2 tablet on sunday      . megestrol (MEGACE) 400 MG/10ML suspension Take 10 mLs (400 mg total) by mouth 4 (four) times daily.  240 mL  1  . ondansetron (ZOFRAN) 8 MG tablet Take 1 tablet (8 mg total) by mouth every 8 (eight) hours as needed for nausea.  20 tablet  2  . pazopanib (VOTRIENT) 200 MG tablet Take 4 tablets (800 mg total) by mouth daily. Take on an empty stomach.  120 tablet  0  . polyethylene glycol (MIRALAX / GLYCOLAX) packet Take 17 g by mouth daily.      . propranolol (INDERAL) 40 MG tablet Take 40 mg by mouth 2 (two) times daily.      . verapamil (COVERA HS) 240 MG (CO) 24 hr tablet Take 240 mg by mouth at bedtime.      . vitamin C (ASCORBIC ACID) 500 MG tablet Take 500 mg by mouth daily.       No current facility-administered medications for this visit.    Allergies: No Known Allergies  Past Medical History, Surgical history, Social history, and Family History were reviewed and updated.  Review of Systems:  Remaining ROS negative.  Physical Exam: Blood pressure 127/80, pulse 59, temperature 97.2 F (36.2 C), temperature source Oral, resp. rate 18, height 5\' 6"  (1.676 m), weight 153 lb 12.8 oz (69.763 kg). ECOG: 1 General  appearance: alert Head: Normocephalic, without obvious abnormality, atraumatic Neck: no adenopathy, no carotid bruit, no JVD, supple, symmetrical, trachea midline and thyroid not enlarged, symmetric, no tenderness/mass/nodules Lymph nodes: Cervical, supraclavicular, and axillary nodes normal. Heart:regular rate and rhythm, S1, S2 normal, no murmur, click, rub or gallop Lung:chest clear, no wheezing, rales, normal symmetric air entry Abdomen: soft, non-tender, without masses or organomegaly EXT:no erythema, induration, or nodules   Lab Results: Lab Results  Component Value Date   WBC 7.4 09/21/2013   HGB 12.4* 09/21/2013   HCT 35.9* 09/21/2013   MCV 99.2* 09/21/2013   PLT 128*  09/21/2013       A 78 year old gentleman with the following issues.   1. Diagnosis of a colon cancer: He is status post a laparoscopic right hemicolectomy, with the pathology revealing a T3 N0, with 0 out of 21 lymph nodes involved. He probably will not receive adjuvant chemotherapy for colon cancer given that he is likely has stage IV kidney cancer.   2. Kidney mass and lung lesions. Biopsy results suggested stage IV kidney cancer. He is currently on Votrient with grade 1 nausea and grade 1 diarrhea that have resolved now. CT scan from 8/29  showed excellent response at this time.  Recommend that he continue Votrient without dose modification. I will repeat CT scan in 2 months.   3. Nausea. This seems to control this time.  4. Diarrhea. Due to prior hemicolectomy versus Votrient. I have advised him to use Imodium as needed.  5. Protein calorie malnutrition. I have advised him to increase his Ensure intake to at least 3 cans per day.   6. Thrombocytopenia. Due to Votrient, improved now.   7. Followup: Will be in 5-6 weeks.       Hunter Hardin 10/2/20143:57 PM

## 2013-09-21 NOTE — Telephone Encounter (Signed)
gv and printed appt sched and avs for pt for NOV °

## 2013-09-25 ENCOUNTER — Telehealth: Payer: Self-pay | Admitting: *Deleted

## 2013-09-25 ENCOUNTER — Other Ambulatory Visit: Payer: Self-pay | Admitting: *Deleted

## 2013-09-25 DIAGNOSIS — C649 Malignant neoplasm of unspecified kidney, except renal pelvis: Secondary | ICD-10-CM

## 2013-09-25 DIAGNOSIS — C189 Malignant neoplasm of colon, unspecified: Secondary | ICD-10-CM

## 2013-09-25 MED ORDER — PAZOPANIB HCL 200 MG PO TABS: 800.0000 mg | ORAL_TABLET | Freq: Every day | ORAL | Status: DC

## 2013-09-25 NOTE — Telephone Encounter (Signed)
Refill for Votrient faxed to [facility] [phone]

## 2013-09-25 NOTE — Telephone Encounter (Signed)
THIS REFILL REQUEST FOR VOTRIENT WAS PLACED IN DR.SHADAD'S ACTIVE WORK FOLDER. 

## 2013-09-30 ENCOUNTER — Other Ambulatory Visit: Payer: Self-pay | Admitting: Family Medicine

## 2013-10-20 DIAGNOSIS — L570 Actinic keratosis: Secondary | ICD-10-CM | POA: Diagnosis not present

## 2013-10-20 DIAGNOSIS — L219 Seborrheic dermatitis, unspecified: Secondary | ICD-10-CM | POA: Diagnosis not present

## 2013-10-20 DIAGNOSIS — L738 Other specified follicular disorders: Secondary | ICD-10-CM | POA: Diagnosis not present

## 2013-10-20 DIAGNOSIS — L821 Other seborrheic keratosis: Secondary | ICD-10-CM | POA: Diagnosis not present

## 2013-11-02 ENCOUNTER — Other Ambulatory Visit: Payer: Self-pay | Admitting: *Deleted

## 2013-11-02 NOTE — Telephone Encounter (Signed)
THIS REFILL REQUEST FOR VOTRIENT WAS PLACED IN DR.SHADAD'S ACTIVE WORK FOLDER. 

## 2013-11-03 ENCOUNTER — Other Ambulatory Visit: Payer: Self-pay | Admitting: *Deleted

## 2013-11-03 DIAGNOSIS — C649 Malignant neoplasm of unspecified kidney, except renal pelvis: Secondary | ICD-10-CM

## 2013-11-03 DIAGNOSIS — C189 Malignant neoplasm of colon, unspecified: Secondary | ICD-10-CM

## 2013-11-03 MED ORDER — PAZOPANIB HCL 200 MG PO TABS: 800.0000 mg | ORAL_TABLET | Freq: Every day | ORAL | Status: DC

## 2013-11-03 NOTE — Telephone Encounter (Signed)
E-scribed refill for votrient to [facility]

## 2013-11-07 ENCOUNTER — Telehealth: Payer: Self-pay | Admitting: Oncology

## 2013-11-07 ENCOUNTER — Other Ambulatory Visit (HOSPITAL_BASED_OUTPATIENT_CLINIC_OR_DEPARTMENT_OTHER): Payer: Medicare Other | Admitting: Lab

## 2013-11-07 ENCOUNTER — Ambulatory Visit (HOSPITAL_BASED_OUTPATIENT_CLINIC_OR_DEPARTMENT_OTHER): Payer: Medicare Other | Admitting: Oncology

## 2013-11-07 VITALS — BP 121/69 | HR 52 | Temp 96.6°F | Wt 149.1 lb

## 2013-11-07 DIAGNOSIS — R110 Nausea: Secondary | ICD-10-CM | POA: Diagnosis not present

## 2013-11-07 DIAGNOSIS — C7800 Secondary malignant neoplasm of unspecified lung: Secondary | ICD-10-CM

## 2013-11-07 DIAGNOSIS — R197 Diarrhea, unspecified: Secondary | ICD-10-CM | POA: Diagnosis not present

## 2013-11-07 DIAGNOSIS — C189 Malignant neoplasm of colon, unspecified: Secondary | ICD-10-CM

## 2013-11-07 DIAGNOSIS — C649 Malignant neoplasm of unspecified kidney, except renal pelvis: Secondary | ICD-10-CM

## 2013-11-07 LAB — COMPREHENSIVE METABOLIC PANEL (CC13)
AST: 25 U/L (ref 5–34)
Albumin: 2.9 g/dL — ABNORMAL LOW (ref 3.5–5.0)
Alkaline Phosphatase: 63 U/L (ref 40–150)
BUN: 13.5 mg/dL (ref 7.0–26.0)
Calcium: 9.1 mg/dL (ref 8.4–10.4)
Creatinine: 0.9 mg/dL (ref 0.7–1.3)
Glucose: 93 mg/dl (ref 70–140)
Potassium: 3.5 mEq/L (ref 3.5–5.1)

## 2013-11-07 LAB — CBC WITH DIFFERENTIAL/PLATELET
BASO%: 0.4 % (ref 0.0–2.0)
Basophils Absolute: 0 10*3/uL (ref 0.0–0.1)
EOS%: 2.7 % (ref 0.0–7.0)
Eosinophils Absolute: 0.2 10*3/uL (ref 0.0–0.5)
HCT: 37.4 % — ABNORMAL LOW (ref 38.4–49.9)
HGB: 12.5 g/dL — ABNORMAL LOW (ref 13.0–17.1)
MCH: 36.3 pg — ABNORMAL HIGH (ref 27.2–33.4)
MCV: 108.8 fL — ABNORMAL HIGH (ref 79.3–98.0)
MONO#: 0.5 10*3/uL (ref 0.1–0.9)
MONO%: 7.2 % (ref 0.0–14.0)
NEUT#: 2.2 10*3/uL (ref 1.5–6.5)
NEUT%: 34 % — ABNORMAL LOW (ref 39.0–75.0)
Platelets: 117 10*3/uL — ABNORMAL LOW (ref 140–400)
RDW: 14.6 % (ref 11.0–14.6)
WBC: 6.5 10*3/uL (ref 4.0–10.3)

## 2013-11-07 LAB — TECHNOLOGIST REVIEW

## 2013-11-07 NOTE — Telephone Encounter (Signed)
gv and printed appt sched and avs for pt fro DEC °

## 2013-11-07 NOTE — Progress Notes (Signed)
Hematology and Oncology Follow Up Visit  Hunter Hardin [id number] [birth year] 78 y.o. 11/07/2013 8:35 AM   Principle Diagnosis: 78 year old with: 1. T3 N0 colon cancer: He is status post a laparoscopic right hemicolectomy, with 0 out of 21 lymph nodes involved. That was done on 03/06/2013. 2. Kidney mass and lung lesions. Work up is ongoing.   Current therapy: Started Votrient 800 mg daily on 05/22/2013   Interim History: Hunter Hardin presents today for a follow up visit. He has been on Votrient since 05/2013 and tolerated well now. He had some intermittent nausea which also rsolved.  Reports having loose stools about 2-3 times a day. Appetite is better now. He is drinking about two can of Ensure per day. Denies chest pain, shortness of breath, dyspnea on exertion. Denies abdominal pain. He does not report any hematochezia. Performance status and activity level are actually improving and getting close to baseline. He has not reported any hospitalization or illnesses. He is reporting his appetite about the same but his weight has declined some. He has not reported any pain in his hands or feet.   Medications: I have reviewed the patient's current medications.  Current Outpatient Prescriptions  Medication Sig Dispense Refill  . atorvastatin (LIPITOR) 10 MG tablet TAKE 1 TABLET DAILY  90 tablet  0  . doxazosin (CARDURA) 8 MG tablet TAKE 1 TABLET DAILY  90 tablet  1  . fish oil-omega-3 fatty acids 1000 MG capsule Take 1 g by mouth 2 (two) times daily.      . hydrochlorothiazide (HYDRODIURIL) 25 MG tablet TAKE 1 TABLET BY MOUTH DAILY  90 tablet  1  . lactose free nutrition (BOOST) LIQD Take 237 mLs by mouth daily.      [name] levothyroxine (SYNTHROID, LEVOTHROID) 50 MCG tablet Take 50-75 mcg by mouth every evening. Takes 1 tablet Monday- Saturday then takes 1 and 1/2 tablet on sunday      . megestrol (MEGACE) 400 MG/10ML suspension Take 10 mLs (400 mg total) by mouth 4 (four) times daily.  240 mL   1  . ondansetron (ZOFRAN) 8 MG tablet Take 1 tablet (8 mg total) by mouth every 8 (eight) hours as needed for nausea.  20 tablet  2  . pazopanib (VOTRIENT) 200 MG tablet Take 4 tablets (800 mg total) by mouth daily. Take on an empty stomach.  120 tablet  0  . polyethylene glycol (MIRALAX / GLYCOLAX) packet Take 17 g by mouth daily.      . propranolol (INDERAL) 40 MG tablet Take 40 mg by mouth 2 (two) times daily.      . propranolol (INDERAL) 40 MG tablet TAKE 1 TABLET TWICE A DAY  180 tablet  0  . verapamil (COVERA HS) 240 MG (CO) 24 hr tablet Take 240 mg by mouth at bedtime.      . vitamin C (ASCORBIC ACID) 500 MG tablet Take 500 mg by mouth daily.       No current facility-administered medications for this visit.    Allergies: No Known Allergies  Past Medical History, Surgical history, Social history, and Family History were reviewed and updated.  Review of Systems:  Remaining ROS negative.  Physical Exam: There were no vitals taken for this visit. ECOG: 1 General appearance: alert Head: Normocephalic, without obvious abnormality, atraumatic Neck: no adenopathy, no carotid bruit, no JVD, supple, symmetrical, trachea midline and thyroid not enlarged, symmetric, no tenderness/mass/nodules Lymph nodes: Cervical, supraclavicular, and axillary nodes normal. Heart:regular rate and  rhythm, S1, S2 normal, no murmur, click, rub or gallop Lung:chest clear, no wheezing, rales, normal symmetric air entry Abdomen: soft, non-tender, without masses or organomegaly EXT:no erythema, induration, or nodules   Lab Results: Lab Results  Component Value Date   WBC 6.5 11/07/2013   HGB 12.5* 11/07/2013   HCT 37.4* 11/07/2013   MCV 108.8* 11/07/2013   PLT 117* 11/07/2013       A 78 year old gentleman with the following issues.   1. Diagnosis of a colon cancer: He is status post a laparoscopic right hemicolectomy, with the pathology revealing a T3 N0, with 0 out of 21 lymph nodes involved. He  probably will not receive adjuvant chemotherapy for colon cancer given that he is likely has stage IV kidney cancer.   2. Kidney mass and lung lesions. Biopsy results suggested stage IV kidney cancer. He is currently on Votrient with grade 1 nausea and grade 1 diarrhea that have resolved now. CT scan from 8/29  showed excellent response at this time.  Recommend that he continue Votrient without dose modification. I will repeat CT scan in in 12/2013.  3. Nausea. This seems to control this time.  4. Diarrhea. Due to prior hemicolectomy versus Votrient. I have advised him to use Imodium as needed.  5. Protein calorie malnutrition. I have advised him to increase his Ensure intake to at least 3 cans per day.   6. Thrombocytopenia. Due to Votrient, improved now.   7. Followup: Will be in 4 weeks.       Hunter Hardin 11/18/20148:35 AM

## 2013-11-10 ENCOUNTER — Other Ambulatory Visit: Payer: Self-pay | Admitting: Family Medicine

## 2013-11-22 ENCOUNTER — Other Ambulatory Visit: Payer: Self-pay | Admitting: Oncology

## 2013-11-30 ENCOUNTER — Other Ambulatory Visit: Payer: Self-pay | Admitting: Family Medicine

## 2013-12-01 ENCOUNTER — Other Ambulatory Visit: Payer: Self-pay | Admitting: Family Medicine

## 2013-12-07 ENCOUNTER — Encounter: Payer: Self-pay | Admitting: *Deleted

## 2013-12-07 ENCOUNTER — Telehealth: Payer: Self-pay | Admitting: Oncology

## 2013-12-07 ENCOUNTER — Ambulatory Visit (HOSPITAL_BASED_OUTPATIENT_CLINIC_OR_DEPARTMENT_OTHER): Payer: Medicare Other | Admitting: Oncology

## 2013-12-07 ENCOUNTER — Other Ambulatory Visit (HOSPITAL_BASED_OUTPATIENT_CLINIC_OR_DEPARTMENT_OTHER): Payer: Medicare Other

## 2013-12-07 VITALS — BP 124/62 | HR 53 | Temp 97.2°F | Resp 19 | Ht 66.0 in | Wt 151.2 lb

## 2013-12-07 DIAGNOSIS — E46 Unspecified protein-calorie malnutrition: Secondary | ICD-10-CM | POA: Diagnosis not present

## 2013-12-07 DIAGNOSIS — R197 Diarrhea, unspecified: Secondary | ICD-10-CM | POA: Diagnosis not present

## 2013-12-07 DIAGNOSIS — C649 Malignant neoplasm of unspecified kidney, except renal pelvis: Secondary | ICD-10-CM | POA: Diagnosis not present

## 2013-12-07 DIAGNOSIS — C189 Malignant neoplasm of colon, unspecified: Secondary | ICD-10-CM

## 2013-12-07 LAB — COMPREHENSIVE METABOLIC PANEL (CC13)
AST: 37 U/L — ABNORMAL HIGH (ref 5–34)
Albumin: 2.9 g/dL — ABNORMAL LOW (ref 3.5–5.0)
Alkaline Phosphatase: 73 U/L (ref 40–150)
Anion Gap: 9 mEq/L (ref 3–11)
BUN: 15.7 mg/dL (ref 7.0–26.0)
CO2: 28 mEq/L (ref 22–29)
Creatinine: 0.9 mg/dL (ref 0.7–1.3)
Glucose: 122 mg/dl (ref 70–140)
Potassium: 3 mEq/L — CL (ref 3.5–5.1)
Total Bilirubin: 0.99 mg/dL (ref 0.20–1.20)

## 2013-12-07 LAB — CBC WITH DIFFERENTIAL/PLATELET
Basophils Absolute: 0 10*3/uL (ref 0.0–0.1)
EOS%: 3.6 % (ref 0.0–7.0)
Eosinophils Absolute: 0.2 10*3/uL (ref 0.0–0.5)
HGB: 11.7 g/dL — ABNORMAL LOW (ref 13.0–17.1)
LYMPH%: 60 % — ABNORMAL HIGH (ref 14.0–49.0)
MCH: 36.7 pg — ABNORMAL HIGH (ref 27.2–33.4)
MCV: 108.7 fL — ABNORMAL HIGH (ref 79.3–98.0)
MONO%: 7.6 % (ref 0.0–14.0)
NEUT#: 1.6 10*3/uL (ref 1.5–6.5)
Platelets: 100 10*3/uL — ABNORMAL LOW (ref 140–400)
RBC: 3.19 10*6/uL — ABNORMAL LOW (ref 4.20–5.82)

## 2013-12-07 MED ORDER — DIPHENOXYLATE-ATROPINE 2.5-0.025 MG PO TABS: 1.0000 | ORAL_TABLET | Freq: Four times a day (QID) | ORAL | Status: DC | PRN

## 2013-12-07 NOTE — Telephone Encounter (Signed)
gv adn printed papt sched and avs for pt for Jan 2015...gv pt barium

## 2013-12-07 NOTE — Progress Notes (Signed)
Hematology and Oncology Follow Up Visit  Hunter Hardin [id number] March 29, 1935 78 y.o. 12/07/2013 3:42 PM   Principle Diagnosis: 78 year old with: 1. T3 N0 colon cancer: He is status post a laparoscopic right hemicolectomy, with 0 out of 21 lymph nodes involved. That was done on 03/06/2013. 2. Kidney mass and lung lesions. Work up is ongoing.   Current therapy: Started Votrient 800 mg daily on 05/22/2013   Interim History: Hunter Hardin presents today for a follow up visit. He has been on Votrient since 05/2013 and tolerated well now. He had some intermittent nausea which also rsolved.  Reports having loose stools about 2-3 times a day which has increased since the last time. He is using over-the-counter Imodium without any much benefit. Appetite is better now. He is drinking about two cans of Ensure per day. Denies chest pain, shortness of breath, dyspnea on exertion. Denies abdominal pain. He does not report any hematochezia. Performance status and activity level are actually improving and getting close to baseline. He has not reported any hospitalization or illnesses. He is reporting his appetite about the same but his weight has declined some. He has not reported any pain in his hands or feet.   Medications: I have reviewed the patient's current medications.  Current Outpatient Prescriptions  Medication Sig Dispense Refill  . atorvastatin (LIPITOR) 10 MG tablet TAKE 1 TABLET DAILY  90 tablet  0  . doxazosin (CARDURA) 8 MG tablet TAKE 1 TABLET DAILY  90 tablet  1  . fish oil-omega-3 fatty acids 1000 MG capsule Take 1 g by mouth 2 (two) times daily.      . hydrochlorothiazide (HYDRODIURIL) 25 MG tablet TAKE 1 TABLET BY MOUTH DAILY  90 tablet  1  . lactose free nutrition (BOOST) LIQD Take 237 mLs by mouth daily.      [name] levothyroxine (SYNTHROID, LEVOTHROID) 50 MCG tablet TAKE ONE TABLET DAILY EXCEPT TAKE ONE AND ONE-HALF TABLETS ON SUNDAY  35 tablet  0  . megestrol (MEGACE) 400 MG/10ML  suspension Take 10 mLs (400 mg total) by mouth 4 (four) times daily.  240 mL  1  . ondansetron (ZOFRAN) 8 MG tablet Take 1 tablet (8 mg total) by mouth every 8 (eight) hours as needed for nausea.  20 tablet  2  . polyethylene glycol (MIRALAX / GLYCOLAX) packet Take 17 g by mouth daily.      . propranolol (INDERAL) 40 MG tablet Take 40 mg by mouth 2 (two) times daily.      . propranolol (INDERAL) 40 MG tablet TAKE 1 TABLET TWICE A DAY  180 tablet  0  . verapamil (CALAN-SR) 240 MG CR tablet TAKE 1 TABLET DAILY  90 tablet  0  . verapamil (COVERA HS) 240 MG (CO) 24 hr tablet Take 240 mg by mouth at bedtime.      . vitamin C (ASCORBIC ACID) 500 MG tablet Take 500 mg by mouth daily.      [name] VOTRIENT 200 MG tablet TAKE FOUR TABLETS (800 MG) DAILY ON AN EMPTY STOMACH  120 tablet  0  . diphenoxylate-atropine (LOMOTIL) 2.5-0.025 MG per tablet Take 1 tablet by mouth 4 (four) times daily as needed for diarrhea or loose stools.  30 tablet  0   No current facility-administered medications for this visit.    Allergies: No Known Allergies  Past Medical History, Surgical history, Social history, and Family History were reviewed and updated.  Review of Systems:  Remaining ROS negative.  Physical  Exam: Blood pressure 124/62, pulse 53, temperature 97.2 F (36.2 C), temperature source Oral, resp. rate 19, height 5\' 6"  (1.676 m), weight 151 lb 3.2 oz (68.584 kg). ECOG: 1 General appearance: alert Head: Normocephalic, without obvious abnormality, atraumatic Neck: no adenopathy, no carotid bruit, no JVD, supple, symmetrical, trachea midline and thyroid not enlarged, symmetric, no tenderness/mass/nodules Lymph nodes: Cervical, supraclavicular, and axillary nodes normal. Heart:regular rate and rhythm, S1, S2 normal, no murmur, click, rub or gallop Lung:chest clear, no wheezing, rales, normal symmetric air entry Abdomen: soft, non-tender, without masses or organomegaly EXT:no erythema, induration, or  nodules   Lab Results: Lab Results  Component Value Date   WBC 5.7 12/07/2013   HGB 11.7* 12/07/2013   HCT 34.7* 12/07/2013   MCV 108.7* 12/07/2013   PLT 100* 12/07/2013       A 78 year old gentleman with the following issues.   1. Diagnosis of a colon cancer: He is status post a laparoscopic right hemicolectomy, with the pathology revealing a T3 N0, with 0 out of 21 lymph nodes involved. He probably will not receive adjuvant chemotherapy for colon cancer given that he is likely has stage IV kidney cancer.   2. Kidney mass and lung lesions. Biopsy results suggested stage IV kidney cancer. He is currently on Votrient with grade 1 nausea and grade 1 diarrhea. CT scan from 8/29  showed excellent response at this time.  Recommend that he continue Votrient without dose modification. I will repeat CT scan in in 12/2013. Discussed with him possible dose reduction if his diarrhea continue to be an issue, line.  3. Nausea. This seems to control this time.  4. Diarrhea. Due to prior hemicolectomy versus Votrient. I gave him prescription for Lomotil to take when necessary.  5. Protein calorie malnutrition. I have advised him to continue supplements once a day.  6. Thrombocytopenia. Due to Votrient continues to be stable.  7. Followup: Will be in 4 to 5 weeks.       Hunter Hardin 12/18/20143:42 PM

## 2013-12-07 NOTE — Progress Notes (Signed)
Called in k-dur 20 meq to [facility] for patient. Left messages on home phone and  cell phone, for patient to start tonight and take for 14 days.

## 2013-12-12 DIAGNOSIS — R972 Elevated prostate specific antigen [PSA]: Secondary | ICD-10-CM | POA: Diagnosis not present

## 2013-12-14 ENCOUNTER — Other Ambulatory Visit: Payer: Self-pay | Admitting: Family Medicine

## 2013-12-20 DIAGNOSIS — N400 Enlarged prostate without lower urinary tract symptoms: Secondary | ICD-10-CM | POA: Diagnosis not present

## 2013-12-20 DIAGNOSIS — C649 Malignant neoplasm of unspecified kidney, except renal pelvis: Secondary | ICD-10-CM | POA: Diagnosis not present

## 2013-12-25 ENCOUNTER — Other Ambulatory Visit: Payer: Self-pay | Admitting: Family Medicine

## 2013-12-25 ENCOUNTER — Other Ambulatory Visit: Payer: Self-pay | Admitting: Oncology

## 2014-01-03 ENCOUNTER — Other Ambulatory Visit: Payer: Self-pay | Admitting: Family Medicine

## 2014-01-04 ENCOUNTER — Encounter: Payer: Self-pay | Admitting: Internal Medicine

## 2014-01-05 ENCOUNTER — Other Ambulatory Visit: Payer: Self-pay

## 2014-01-05 ENCOUNTER — Other Ambulatory Visit: Payer: Self-pay | Admitting: *Deleted

## 2014-01-05 MED ORDER — PROPRANOLOL HCL 40 MG PO TABS: 40.0000 mg | ORAL_TABLET | Freq: Two times a day (BID) | ORAL | Status: DC

## 2014-01-05 NOTE — Telephone Encounter (Signed)
Sent in  Refilled 3 mo supply x1 with note to schedule f/u appt

## 2014-01-05 NOTE — Telephone Encounter (Signed)
Pt left note requesting refill propranolol 40 mg [facility]; pt last seen 03/03/13 and last refill 09/30/13 for # 180 with note needed to schedule appt. No future appt scheduled.Please advise.

## 2014-01-08 NOTE — Telephone Encounter (Signed)
Patient advised. Appointment scheduled.  

## 2014-01-10 ENCOUNTER — Ambulatory Visit (INDEPENDENT_AMBULATORY_CARE_PROVIDER_SITE_OTHER): Payer: Medicare Other | Admitting: General Surgery

## 2014-01-10 ENCOUNTER — Other Ambulatory Visit (INDEPENDENT_AMBULATORY_CARE_PROVIDER_SITE_OTHER): Payer: Self-pay

## 2014-01-10 ENCOUNTER — Encounter (INDEPENDENT_AMBULATORY_CARE_PROVIDER_SITE_OTHER): Payer: Self-pay | Admitting: General Surgery

## 2014-01-10 VITALS — BP 110/80 | HR 48 | Resp 12 | Wt 140.0 lb

## 2014-01-10 DIAGNOSIS — C189 Malignant neoplasm of colon, unspecified: Secondary | ICD-10-CM

## 2014-01-10 NOTE — Progress Notes (Signed)
Hunter Hardin. is a 79 y.o. male who is here for a follow up visit regarding his colon cancer.  He is status post an R colectomy on 3/17.  This was complicated by a wound infection.  His final pathology was T3N0.  He is currently being treated for stage IV RCC.  He has had a 30lb weight loss since July.  He is having some diarrhea.  He is due for a colonoscopy.   Objective: Filed Vitals:   01/10/14 1115  BP: 110/80  Pulse: 48  Resp: 12    General appearance: alert and cooperative Resp: clear to auscultation bilaterally Cardio: regular rate and rhythm GI: normal findings: soft, non-tender   Assessment and Plan: Hunter Hardin. is a 79 y.o. M with synchronous colon and renal cancers.  I will order a CEA.  I will defer decision for colonoscopy to Dr Alen Blew.  I will see him back in 6 months.    Rosario Adie, [facility], [place]

## 2014-01-10 NOTE — Patient Instructions (Signed)
We will get some lab work and let you know the results.  Discuss colonoscopy with Dr Alen Blew

## 2014-01-11 ENCOUNTER — Encounter: Payer: Self-pay | Admitting: Family Medicine

## 2014-01-11 ENCOUNTER — Ambulatory Visit (INDEPENDENT_AMBULATORY_CARE_PROVIDER_SITE_OTHER): Payer: Medicare Other | Admitting: Family Medicine

## 2014-01-11 VITALS — BP 122/60 | HR 72 | Temp 97.4°F | Wt 140.2 lb

## 2014-01-11 DIAGNOSIS — C649 Malignant neoplasm of unspecified kidney, except renal pelvis: Secondary | ICD-10-CM | POA: Diagnosis not present

## 2014-01-11 DIAGNOSIS — E785 Hyperlipidemia, unspecified: Secondary | ICD-10-CM | POA: Diagnosis not present

## 2014-01-11 DIAGNOSIS — I10 Essential (primary) hypertension: Secondary | ICD-10-CM | POA: Diagnosis not present

## 2014-01-11 DIAGNOSIS — E039 Hypothyroidism, unspecified: Secondary | ICD-10-CM

## 2014-01-11 LAB — CBC WITH DIFFERENTIAL/PLATELET
Basophils Absolute: 0 10*3/uL (ref 0.0–0.1)
Basophils Relative: 0.2 % (ref 0.0–3.0)
EOS PCT: 2.8 % (ref 0.0–5.0)
Eosinophils Absolute: 0.2 10*3/uL (ref 0.0–0.7)
HEMATOCRIT: 38.1 % — AB (ref 39.0–52.0)
HEMOGLOBIN: 13.2 g/dL (ref 13.0–17.0)
LYMPHS ABS: 4.6 10*3/uL — AB (ref 0.7–4.0)
Lymphocytes Relative: 60.8 % — ABNORMAL HIGH (ref 12.0–46.0)
MCHC: 34.7 g/dL (ref 30.0–36.0)
MCV: 105.4 fl — AB (ref 78.0–100.0)
MONO ABS: 0.6 10*3/uL (ref 0.1–1.0)
Monocytes Relative: 7.4 % (ref 3.0–12.0)
NEUTROS ABS: 2.2 10*3/uL (ref 1.4–7.7)
Neutrophils Relative %: 28.8 % — ABNORMAL LOW (ref 43.0–77.0)
PLATELETS: 126 10*3/uL — AB (ref 150.0–400.0)
RBC: 3.62 Mil/uL — ABNORMAL LOW (ref 4.22–5.81)
RDW: 14.1 % (ref 11.5–14.6)
WBC: 7.6 10*3/uL (ref 4.5–10.5)

## 2014-01-11 LAB — COMPREHENSIVE METABOLIC PANEL
ALK PHOS: 62 U/L (ref 39–117)
ALT: 22 U/L (ref 0–53)
AST: 29 U/L (ref 0–37)
Albumin: 3.4 g/dL — ABNORMAL LOW (ref 3.5–5.2)
BILIRUBIN TOTAL: 1.1 mg/dL (ref 0.3–1.2)
BUN: 23 mg/dL (ref 6–23)
CO2: 24 mEq/L (ref 19–32)
Calcium: 9 mg/dL (ref 8.4–10.5)
Chloride: 105 mEq/L (ref 96–112)
Creatinine, Ser: 1.4 mg/dL (ref 0.4–1.5)
GFR: 51.45 mL/min — ABNORMAL LOW (ref >60.00)
GLUCOSE: 94 mg/dL (ref 70–99)
Potassium: 3.5 mEq/L (ref 3.5–5.1)
Sodium: 138 mEq/L (ref 135–145)
Total Protein: 7.1 g/dL (ref 6.0–8.3)

## 2014-01-11 LAB — TSH: TSH: 8.58 u[IU]/mL — ABNORMAL HIGH (ref 0.35–5.50)

## 2014-01-11 LAB — LIPID PANEL
CHOL/HDL RATIO: 3
Cholesterol: 111 mg/dL (ref 0–200)
HDL: 33 mg/dL — AB (ref >39.00)
LDL Cholesterol: 39 mg/dL (ref 0–99)
Triglycerides: 197 mg/dL — ABNORMAL HIGH (ref 0.0–149.0)
VLDL: 39.4 mg/dL (ref 0.0–40.0)

## 2014-01-11 NOTE — Patient Instructions (Signed)
Don't change your meds for now.  Go to the lab on the way out.  We'll contact you with your lab report. We'll go from there.  We'll send your labs to your other MDs.  Take care.  Let us or the pharmacy know when you are running low on your meds and need a refill.

## 2014-01-11 NOTE — Progress Notes (Signed)
Pre-visit discussion using our clinic review tool. No additional management support is needed unless otherwise documented below in the visit note.  Colon cancer followed by surgery and onc clinic. Also with RCC treatment ongoing.  Has f/u with onc pending.  Due for labs for onc and surgery clinic. Orders already in EMR.   Hypertension:    Using medication without problems or lightheadedness: yes Chest pain with exertion:no Edema:no Short of breath:no D/w pt about possibly tapering some meds given his sig weight loss. See labs.   Hypothyroid.  Compliant, due for recheck TSH.  No neck mass.    Meds, vitals, and allergies reviewed.   ROS: See HPI.  Otherwise, noncontributory.  nad ncat Mmm rrr cta Abd soft Ext w/o edema

## 2014-01-12 ENCOUNTER — Telehealth: Payer: Self-pay | Admitting: Family Medicine

## 2014-01-12 ENCOUNTER — Other Ambulatory Visit: Payer: Self-pay | Admitting: Family Medicine

## 2014-01-12 DIAGNOSIS — E039 Hypothyroidism, unspecified: Secondary | ICD-10-CM

## 2014-01-12 LAB — CEA: CEA: 3.5 ng/mL (ref 0.0–5.0)

## 2014-01-12 MED ORDER — LEVOTHYROXINE SODIUM 50 MCG PO TABS: ORAL_TABLET | ORAL | Status: DC

## 2014-01-12 NOTE — Assessment & Plan Note (Signed)
See notes on labs. 

## 2014-01-12 NOTE — Assessment & Plan Note (Signed)
Per onc and surgery.

## 2014-01-12 NOTE — Telephone Encounter (Signed)
Relevant patient education assigned to patient using Emmi. ° °

## 2014-01-12 NOTE — Assessment & Plan Note (Signed)
BP okay for now, no change in BP meds today. Would have low threshold to taper if BP is soft.

## 2014-01-15 ENCOUNTER — Other Ambulatory Visit: Payer: Self-pay | Admitting: *Deleted

## 2014-01-15 DIAGNOSIS — C649 Malignant neoplasm of unspecified kidney, except renal pelvis: Secondary | ICD-10-CM

## 2014-01-16 ENCOUNTER — Encounter (HOSPITAL_COMMUNITY): Payer: Self-pay

## 2014-01-16 ENCOUNTER — Other Ambulatory Visit: Payer: Self-pay | Admitting: Oncology

## 2014-01-16 ENCOUNTER — Ambulatory Visit (HOSPITAL_COMMUNITY)
Admission: RE | Admit: 2014-01-16 | Discharge: 2014-01-16 | Disposition: A | Discharge status: Home / Self Care | Payer: Medicare Other | Source: Ambulatory Visit | Attending: Oncology | Admitting: Oncology

## 2014-01-16 ENCOUNTER — Other Ambulatory Visit: Payer: Medicare Other

## 2014-01-16 DIAGNOSIS — Z85038 Personal history of other malignant neoplasm of large intestine: Secondary | ICD-10-CM | POA: Insufficient documentation

## 2014-01-16 DIAGNOSIS — M51379 Other intervertebral disc degeneration, lumbosacral region without mention of lumbar back pain or lower extremity pain: Secondary | ICD-10-CM | POA: Insufficient documentation

## 2014-01-16 DIAGNOSIS — J90 Pleural effusion, not elsewhere classified: Secondary | ICD-10-CM | POA: Diagnosis not present

## 2014-01-16 DIAGNOSIS — C7800 Secondary malignant neoplasm of unspecified lung: Secondary | ICD-10-CM | POA: Insufficient documentation

## 2014-01-16 DIAGNOSIS — I708 Atherosclerosis of other arteries: Secondary | ICD-10-CM | POA: Insufficient documentation

## 2014-01-16 DIAGNOSIS — M5137 Other intervertebral disc degeneration, lumbosacral region: Secondary | ICD-10-CM | POA: Insufficient documentation

## 2014-01-16 DIAGNOSIS — J479 Bronchiectasis, uncomplicated: Secondary | ICD-10-CM | POA: Insufficient documentation

## 2014-01-16 DIAGNOSIS — C649 Malignant neoplasm of unspecified kidney, except renal pelvis: Secondary | ICD-10-CM | POA: Insufficient documentation

## 2014-01-16 DIAGNOSIS — R918 Other nonspecific abnormal finding of lung field: Secondary | ICD-10-CM | POA: Insufficient documentation

## 2014-01-16 DIAGNOSIS — N200 Calculus of kidney: Secondary | ICD-10-CM | POA: Insufficient documentation

## 2014-01-16 DIAGNOSIS — R609 Edema, unspecified: Secondary | ICD-10-CM | POA: Insufficient documentation

## 2014-01-16 DIAGNOSIS — G589 Mononeuropathy, unspecified: Secondary | ICD-10-CM | POA: Insufficient documentation

## 2014-01-16 DIAGNOSIS — K5730 Diverticulosis of large intestine without perforation or abscess without bleeding: Secondary | ICD-10-CM | POA: Insufficient documentation

## 2014-01-16 DIAGNOSIS — I517 Cardiomegaly: Secondary | ICD-10-CM | POA: Insufficient documentation

## 2014-01-16 DIAGNOSIS — I2510 Atherosclerotic heart disease of native coronary artery without angina pectoris: Secondary | ICD-10-CM | POA: Insufficient documentation

## 2014-01-16 DIAGNOSIS — R599 Enlarged lymph nodes, unspecified: Secondary | ICD-10-CM | POA: Diagnosis not present

## 2014-01-16 DIAGNOSIS — Z9089 Acquired absence of other organs: Secondary | ICD-10-CM | POA: Insufficient documentation

## 2014-01-16 DIAGNOSIS — I080 Rheumatic disorders of both mitral and aortic valves: Secondary | ICD-10-CM | POA: Insufficient documentation

## 2014-01-16 MED ORDER — IOHEXOL 300 MG/ML  SOLN
100.0000 mL | Freq: Once | INTRAMUSCULAR | Status: AC | PRN
  Administered 2014-01-16: 80 mL via INTRAVENOUS

## 2014-01-18 ENCOUNTER — Telehealth (INDEPENDENT_AMBULATORY_CARE_PROVIDER_SITE_OTHER): Payer: Self-pay | Admitting: *Deleted

## 2014-01-18 DIAGNOSIS — C189 Malignant neoplasm of colon, unspecified: Secondary | ICD-10-CM

## 2014-01-18 NOTE — Telephone Encounter (Signed)
I called the pt and let him know the results of his CEA level.  I notified him that it was a little elevated from the last one.  We will recheck in 3 months.  I told him we will contact him about getting it drawn in 3 months.  He is not due for follow up until 6 months.  I put the order in for the lab

## 2014-01-19 ENCOUNTER — Ambulatory Visit (HOSPITAL_BASED_OUTPATIENT_CLINIC_OR_DEPARTMENT_OTHER): Payer: Medicare Other | Admitting: Oncology

## 2014-01-19 ENCOUNTER — Telehealth: Payer: Self-pay | Admitting: Oncology

## 2014-01-19 VITALS — BP 120/65 | HR 51 | Temp 97.0°F | Resp 18 | Ht 66.0 in | Wt 138.6 lb

## 2014-01-19 DIAGNOSIS — C189 Malignant neoplasm of colon, unspecified: Secondary | ICD-10-CM

## 2014-01-19 DIAGNOSIS — E43 Unspecified severe protein-calorie malnutrition: Secondary | ICD-10-CM | POA: Diagnosis not present

## 2014-01-19 DIAGNOSIS — R197 Diarrhea, unspecified: Secondary | ICD-10-CM

## 2014-01-19 DIAGNOSIS — D696 Thrombocytopenia, unspecified: Secondary | ICD-10-CM

## 2014-01-19 DIAGNOSIS — R634 Abnormal weight loss: Secondary | ICD-10-CM | POA: Diagnosis not present

## 2014-01-19 DIAGNOSIS — N289 Disorder of kidney and ureter, unspecified: Secondary | ICD-10-CM | POA: Diagnosis not present

## 2014-01-19 DIAGNOSIS — J984 Other disorders of lung: Secondary | ICD-10-CM

## 2014-01-19 NOTE — Telephone Encounter (Signed)
Gave pt appt for lab and MD on March 2015

## 2014-01-19 NOTE — Progress Notes (Signed)
Hematology and Oncology Follow Up Visit  Hunter Hardin [id number] 1935/12/15 79 y.o. 01/19/2014 4:08 PM   Principle Diagnosis: 79 year old with: 1. T3 N0 colon cancer: He is status post a laparoscopic right hemicolectomy, with 0 out of 21 lymph nodes involved. That was done on 03/06/2013. 2. Kidney mass and lung lesions. Work up is ongoing.   Current therapy: Started Votrient 800 mg daily on 05/22/2013.  Interim History: Hunter Hardin presents today for a follow up visit. He has been on Votrient since 05/2013 and tolerated well now.  Reports having loose stools about 2-3 times a day which has increased since the last time. He is using over-the-counter Imodium without any much benefit. Appetite is better now. He is drinking about two cans of Ensure per day but still losing weight. Denies chest pain, shortness of breath, dyspnea on exertion. Denies abdominal pain. He does not report any hematochezia. Performance status and activity level are actually improving and getting close to baseline. He has not reported any hospitalization or illnesses.He has not reported any pain in his hands or feet. Has not reported any fevers or chills or sweats.   Medications: I have reviewed the patient's current medications.  Current Outpatient Prescriptions  Medication Sig Dispense Refill  . diphenoxylate-atropine (LOMOTIL) 2.5-0.025 MG per tablet Take 1 tablet by mouth 4 (four) times daily as needed for diarrhea or loose stools.  30 tablet  0  . doxazosin (CARDURA) 8 MG tablet TAKE 1 TABLET DAILY  90 tablet  0  . fish oil-omega-3 fatty acids 1000 MG capsule Take 1 g by mouth 2 (two) times daily.      . hydrochlorothiazide (HYDRODIURIL) 25 MG tablet TAKE 1 TABLET DAILY  90 tablet  0  . lactose free nutrition (BOOST) LIQD Take 237 mLs by mouth daily.      [name] levothyroxine (SYNTHROID, LEVOTHROID) 50 MCG tablet 2 tabs on Sundays and 1 tab on other days.  8 tabs per week total.  105 tablet  3  . megestrol (MEGACE)  400 MG/10ML suspension Take 10 mLs (400 mg total) by mouth 4 (four) times daily.  240 mL  1  . ondansetron (ZOFRAN) 8 MG tablet Take 1 tablet (8 mg total) by mouth every 8 (eight) hours as needed for nausea.  20 tablet  2  . potassium chloride SA (K-DUR,KLOR-CON) 20 MEQ tablet Take 20 mEq by mouth daily. Take 1 tablet daily x 14 days      . propranolol (INDERAL) 40 MG tablet Take 1 tablet (40 mg total) by mouth 2 (two) times daily.  180 tablet  0  . verapamil (CALAN-SR) 240 MG CR tablet TAKE 1 TABLET DAILY  90 tablet  0  . vitamin C (ASCORBIC ACID) 500 MG tablet Take 500 mg by mouth daily.      [name] VOTRIENT 200 MG tablet TAKE FOUR TABLETS DAILY ON AN EMPTY STOMACH  120 tablet  0   No current facility-administered medications for this visit.    Allergies: No Known Allergies  Past Medical History, Surgical history, Social history, and Family History were reviewed and updated.  Review of Systems:  Remaining ROS negative.  Physical Exam: Blood pressure 120/65, pulse 51, temperature 97 F (36.1 C), temperature source Oral, resp. rate 18, height 5\' 6"  (1.676 m), weight 138 lb 9.6 oz (62.869 kg), SpO2 97.00%. ECOG: 1 General appearance: alert Head: Normocephalic, without obvious abnormality, atraumatic Neck: no adenopathy, no carotid bruit, no JVD, supple, symmetrical, trachea midline and  thyroid not enlarged, symmetric, no tenderness/mass/nodules Lymph nodes: Cervical, supraclavicular, and axillary nodes normal. Heart:regular rate and rhythm, S1, S2 normal, no murmur, click, rub or gallop Lung:chest clear, no wheezing, rales, normal symmetric air entry Abdomen: soft, non-tender, without masses or organomegaly EXT:no erythema, induration, or nodules   Lab Results: Lab Results  Component Value Date   WBC 7.6 01/11/2014   HGB 13.2 01/11/2014   HCT 38.1* 01/11/2014   MCV 105.4* 01/11/2014   PLT 126.0* 01/11/2014     EXAM:  CT CHEST WITH CONTRAST  CT ABDOMEN AND PELVIS WITHOUT AND WITH  CONTRAST  TECHNIQUE:  Multidetector CT imaging of the abdomen was performed without  intravenous contrast. Multidetector CT imaging of the chest and  abdomen was then performed during bolus administration of  intravenous contrast.  CONTRAST: 28mL OMNIPAQUE IOHEXOL 300 MG/ML SOLN  COMPARISON: CT CHEST W/CM dated 08/18/2013  FINDINGS:  CT CHEST FINDINGS  Reduced size and reduced hypervascularity of the mediastinal and  hilar adenopathy. Conglomerate right hilar adenopathy 2.8 x 1.8 cm  on image 46 of series 6, formerly 4.3 x 3.0 cm by my measurements.  This has an internal density of approximately 36 Hounsfield units,  formerly 120 Hounsfield units.  Left hilar lymph node 1.7 x 1.0 cm on image 41 of series 6, formerly  2.9 x 1.6 cm. Lower subcarinal lymph node 1.1 cm in short axis on  image 48 of series 6, formerly 1.8 cm in short axis.  Mild cardiomegaly with coronary artery atherosclerosis as well as  calcification of the aortic and mitral valves. Streak artifact  simulates in aortic dissection in the anterior arch, but no  dissection is confirmed.  Trace bilateral pleural effusions. Stable peripheral fibrosis.  For the most part the pulmonary nodules previously demonstrated have  significantly improved in size or appearance. For example, the  dominant 1.7 x 1.4 cm left lower lobe pulmonary nodule was not  primarily cavitary, with internal gas density, and with a superior  solid component measuring 1.1 x 1.0 cm. The adjacent pulmonary  nodule in the right lower lobe, which previously measured 1.6 by 1.2  cm, currently measures 0.7 x 0.6 cm. Some of the nodules, such as  the 1.1 cm right lower lobe nodule shown on image 34 of series 4 of  the prior exam, are no longer readily apparent as discrete nodules.  Bilateral lower lobe bronchiectasis is observed. No discrete osseous  thoracic metastatic disease observed.  CT ABDOMEN AND PELVIS FINDINGS  No renal calculi observed. There is  aortoiliac atherosclerotic  calcification.  The left renal mass measures 4.3 x 3.6 cm in by my measurements was  previously 5.5 x 4.8 cm. The mass a demonstrates homogeneously  internally complex signal with a density of approximately 15  Hounsfield units, along with a more dense rim both pre and  postcontrast. There is some mild internal heterogeneity with the  slightly more dense component along the anteromedial portion of the  mass measuring in the 30 Hounsfield unit range post-contrast, and in  the 20 Hounsfield unit range precontrast, suggesting a very small  residual enhancing component.  Right mid kidney cyst measures 1.3 cm in diameter.  Mild periportal edema noted. Portal vein and tributaries patent. No  hepatic mass identified. Prior cholecystectomy noted.  There is low grade edema along the gallbladder fossa and tracking in  the pancreaticoduodenal groove. Subtle hypodensity in the pancreatic  head particularly inferiorly. Appearance similar to prior.  Spleen and adrenal glands normal.  The remainder the pancreas appears  normal.  Mild sigmoid colon diverticulosis. Prominent prostate gland indents  the bladder base, and measures 5.6 by 3.9 cm. Calcifications along  the margins of the central zone noted.  Partial right hemicolectomy.  Left sacral Tarlov cyst. There is degenerative disc disease at the  L1-2 level with posterior osseous ridging.  IMPRESSION:  1. Considerable improvement of the thoracic adenopathy, pulmonary  nodules, in further improvement in the left renal mass as detailed  above, compatible with continued significant response to therapy.  There is only faint residual low-grade enhancement in the medial  portion of the cystic lead degenerated renal mass.  2. Continued abnormal hypodensity in stranding around the descending  duodenum and pancreatic head, and particularly in the  pancreaticoduodenal groove, suspicious for continued paraduodenal  pancreatitis.   3. Additional chronic findings include mild sigmoid diverticulosis,  prominent prostate gland, lumbar degenerative disc disease, sacral  Tarlov cyst, right mid kidney cyst, pulmonary fibrosis, trace  bilateral pleural effusions, mild cardiomegaly, coronary  atherosclerosis, and aortic and mitral valvular calcifications.    A 79 year old gentleman with the following issues.   1. Diagnosis of a colon cancer: He is status post a laparoscopic right hemicolectomy, with the pathology revealing a T3 N0, with 0 out of 21 lymph nodes involved. He probably will not receive adjuvant chemotherapy for colon cancer given that he is likely has stage IV kidney cancer.   2. Kidney mass and lung lesions. Biopsy results suggested stage IV kidney cancer. He is currently on Votrient with grade 1 nausea and grade 1 diarrhea. CT scan from 01/16/2014 was reviewed today and continued to show good response to this medication. But given his overall weight loss and diarrhea I will reduce his dose to 600 mg daily.  3. weight loss: Multifactorial in nature I've encouraged him to increase his nutritional supplements to at least 3 times a day I've also asked him to reduce the dose of Votrient and in attempts to curb that decline.  4. Diarrhea. Due to prior hemicolectomy versus Votrient. I gave him prescription for Lomotil to take when necessary.  5. Protein calorie malnutrition. I have advised him to continue supplements once a day.  6. Thrombocytopenia. Due to Votrient continues to be stable.  7. Followup: Will be in 4 to 5 weeks.       XIPJAS,NKNLZ 1/30/20154:08 PM

## 2014-01-21 ENCOUNTER — Other Ambulatory Visit: Payer: Self-pay | Admitting: Family Medicine

## 2014-01-22 ENCOUNTER — Other Ambulatory Visit: Payer: Self-pay | Admitting: Oncology

## 2014-02-14 ENCOUNTER — Other Ambulatory Visit: Payer: Self-pay | Admitting: Family Medicine

## 2014-02-14 NOTE — Telephone Encounter (Signed)
Med not on active medication list, ok to refill?

## 2014-02-15 NOTE — Telephone Encounter (Signed)
Had been stopped. Thanks. Denied.

## 2014-02-21 ENCOUNTER — Other Ambulatory Visit: Payer: Self-pay | Admitting: Oncology

## 2014-02-23 ENCOUNTER — Other Ambulatory Visit (HOSPITAL_BASED_OUTPATIENT_CLINIC_OR_DEPARTMENT_OTHER): Payer: Medicare Other

## 2014-02-23 ENCOUNTER — Encounter: Payer: Self-pay | Admitting: Oncology

## 2014-02-23 ENCOUNTER — Ambulatory Visit (HOSPITAL_BASED_OUTPATIENT_CLINIC_OR_DEPARTMENT_OTHER): Payer: Medicare Other | Admitting: Oncology

## 2014-02-23 ENCOUNTER — Telehealth: Payer: Self-pay | Admitting: Oncology

## 2014-02-23 VITALS — BP 113/61 | HR 57 | Temp 97.4°F | Resp 17 | Ht 66.0 in | Wt 137.9 lb

## 2014-02-23 DIAGNOSIS — C649 Malignant neoplasm of unspecified kidney, except renal pelvis: Secondary | ICD-10-CM

## 2014-02-23 DIAGNOSIS — E46 Unspecified protein-calorie malnutrition: Secondary | ICD-10-CM

## 2014-02-23 DIAGNOSIS — N289 Disorder of kidney and ureter, unspecified: Secondary | ICD-10-CM

## 2014-02-23 DIAGNOSIS — J984 Other disorders of lung: Secondary | ICD-10-CM

## 2014-02-23 DIAGNOSIS — C189 Malignant neoplasm of colon, unspecified: Secondary | ICD-10-CM

## 2014-02-23 DIAGNOSIS — R634 Abnormal weight loss: Secondary | ICD-10-CM | POA: Diagnosis not present

## 2014-02-23 DIAGNOSIS — R197 Diarrhea, unspecified: Secondary | ICD-10-CM

## 2014-02-23 DIAGNOSIS — D696 Thrombocytopenia, unspecified: Secondary | ICD-10-CM

## 2014-02-23 LAB — CBC WITH DIFFERENTIAL/PLATELET
BASO%: 0.1 % (ref 0.0–2.0)
Basophils Absolute: 0 10*3/uL (ref 0.0–0.1)
EOS%: 2.4 % (ref 0.0–7.0)
Eosinophils Absolute: 0.2 10*3/uL (ref 0.0–0.5)
HCT: 34.3 % — ABNORMAL LOW (ref 38.4–49.9)
HGB: 11.6 g/dL — ABNORMAL LOW (ref 13.0–17.1)
LYMPH#: 3.1 10*3/uL (ref 0.9–3.3)
LYMPH%: 45.9 % (ref 14.0–49.0)
MCH: 35.7 pg — ABNORMAL HIGH (ref 27.2–33.4)
MCHC: 33.8 g/dL (ref 32.0–36.0)
MCV: 105.5 fL — ABNORMAL HIGH (ref 79.3–98.0)
MONO#: 0.6 10*3/uL (ref 0.1–0.9)
MONO%: 8.2 % (ref 0.0–14.0)
NEUT#: 2.9 10*3/uL (ref 1.5–6.5)
NEUT%: 43.4 % (ref 39.0–75.0)
Platelets: 89 10*3/uL — ABNORMAL LOW (ref 140–400)
RBC: 3.25 10*6/uL — ABNORMAL LOW (ref 4.20–5.82)
RDW: 14.9 % — ABNORMAL HIGH (ref 11.0–14.6)
WBC: 6.8 10*3/uL (ref 4.0–10.3)

## 2014-02-23 LAB — COMPREHENSIVE METABOLIC PANEL (CC13)
ALT: 21 U/L (ref 0–55)
ANION GAP: 9 meq/L (ref 3–11)
AST: 26 U/L (ref 5–34)
Albumin: 3 g/dL — ABNORMAL LOW (ref 3.5–5.0)
Alkaline Phosphatase: 55 U/L (ref 40–150)
BUN: 22.2 mg/dL (ref 7.0–26.0)
CHLORIDE: 113 meq/L — AB (ref 98–109)
CO2: 23 meq/L (ref 22–29)
Calcium: 8.8 mg/dL (ref 8.4–10.4)
Creatinine: 1.1 mg/dL (ref 0.7–1.3)
Glucose: 94 mg/dl (ref 70–140)
Potassium: 3.9 mEq/L (ref 3.5–5.1)
Sodium: 145 mEq/L (ref 136–145)
TOTAL PROTEIN: 6.3 g/dL — AB (ref 6.4–8.3)
Total Bilirubin: 0.63 mg/dL (ref 0.20–1.20)

## 2014-02-23 NOTE — Telephone Encounter (Signed)
gv and printed appt sched and avs for pt fro April

## 2014-02-23 NOTE — Progress Notes (Signed)
Hematology and Oncology Follow Up Visit  Hunter Hardin [id number] 16-Dec-1935 79 y.o. 02/23/2014 10:18 AM   Principle Diagnosis: 79 year old with: 1. T3 N0 colon cancer: He is status post a laparoscopic right hemicolectomy, with 0 out of 21 lymph nodes involved. That was done on 03/06/2013. 2. Kidney mass and lung lesions. Work up is ongoing.   Current therapy: Started Votrient 800 mg daily on 05/22/2013. Dose was reduced to 600 mg daily on 01/19/14 due to weight loss and diarrhea.  Interim History: Mr. Hunter Hardin presents today for a follow up visit. He has been on Votrient since 05/2013 and tolerated well now.  Dose was reduced at this last visit. Reports continued diarrhea, but only need one Imodium tablet per day. Weight has stabilized with dose reduction and drinking 2-3 cans of Ensure or Boost daily . Denies chest pain, shortness of breath, dyspnea on exertion. Denies abdominal pain. He does not report any hematochezia. Performance status and activity level are actually improving and getting close to baseline. He has not reported any hospitalization or illnesses.He has not reported any pain in his hands or feet. Has not reported any fevers or chills or sweats.   Medications: I have reviewed the patient's current medications.  Current Outpatient Prescriptions  Medication Sig Dispense Refill  . diphenoxylate-atropine (LOMOTIL) 2.5-0.025 MG per tablet Take 1 tablet by mouth 4 (four) times daily as needed for diarrhea or loose stools.  30 tablet  0  . doxazosin (CARDURA) 8 MG tablet TAKE 1 TABLET DAILY  90 tablet  0  . fish oil-omega-3 fatty acids 1000 MG capsule Take 1 g by mouth 2 (two) times daily.      . hydrochlorothiazide (HYDRODIURIL) 25 MG tablet TAKE 1 TABLET DAILY  90 tablet  0  . lactose free nutrition (BOOST) LIQD Take 237 mLs by mouth daily.      Hunter Hardin levothyroxine (SYNTHROID, LEVOTHROID) 50 MCG tablet 2 tabs on Sundays and 1 tab on other days.  8 tabs per week total.  105 tablet   3  . megestrol (MEGACE) 400 MG/10ML suspension Take 10 mLs (400 mg total) by mouth 4 (four) times daily.  240 mL  1  . ondansetron (ZOFRAN) 8 MG tablet Take 1 tablet (8 mg total) by mouth every 8 (eight) hours as needed for nausea.  20 tablet  2  . potassium chloride SA (K-DUR,KLOR-CON) 20 MEQ tablet Take 20 mEq by mouth daily. Take 1 tablet daily x 14 days      . propranolol (INDERAL) 40 MG tablet Take 1 tablet (40 mg total) by mouth 2 (two) times daily.  180 tablet  0  . verapamil (CALAN-SR) 240 MG CR tablet TAKE 1 TABLET DAILY  90 tablet  1  . vitamin C (ASCORBIC ACID) 500 MG tablet Take 500 mg by mouth daily.      Hunter Hardin VOTRIENT 200 MG tablet TAKE FOUR TABLETS (800 MG) DAILY ON AN EMPTY STOMACH  120 tablet  0   No current facility-administered medications for this visit.    Allergies: No Known Allergies  Past Medical History, Surgical history, Social history, and Family History were reviewed and updated.  Review of Systems:  Remaining ROS negative.  Physical Exam: Blood pressure 113/61, pulse 57, temperature 97.4 F (36.3 C), temperature source Oral, resp. rate 17, height 5\' 6"  (1.676 m), weight 137 lb 14.4 oz (62.551 kg), SpO2 99.00%. ECOG: 1 General appearance: alert Head: Normocephalic, without obvious abnormality, atraumatic Neck: no adenopathy, no carotid  bruit, no JVD, supple, symmetrical, trachea midline and thyroid not enlarged, symmetric, no tenderness/mass/nodules Lymph nodes: Cervical, supraclavicular, and axillary nodes normal. Heart:regular rate and rhythm, S1, S2 normal, no murmur, click, rub or gallop Lung:chest clear, no wheezing, rales, normal symmetric air entry Abdomen: soft, non-tender, without masses or organomegaly EXT:no erythema, induration, or nodules   Lab Results: Lab Results  Component Value Date   WBC 6.8 02/23/2014   HGB 11.6* 02/23/2014   HCT 34.3* 02/23/2014   MCV 105.5* 02/23/2014   PLT 89* 02/23/2014     A 79 year old gentleman with the following  issues.   1. Diagnosis of a colon cancer: He is status post a laparoscopic right hemicolectomy, with the pathology revealing a T3 N0, with 0 out of 21 lymph nodes involved. He probably will not receive adjuvant chemotherapy for colon cancer given that he is likely has stage IV kidney cancer.   2. Kidney mass and lung lesions. Biopsy results suggested stage IV kidney cancer. He is currently on Votrient dose reduced to 600 mg daily. CT scan from 01/16/2014 continued to show good response to this medication. Recommend that he continue Votrient 600 mg daily.  3. weight loss: Weight has stabilized. Recommend that he continue to drink 3 cans of Ensure or Boost daily.  4. Diarrhea. Due to prior hemicolectomy versus Votrient. Continue Imodium as needed.  5. Protein calorie malnutrition. Continue Ensure or Boost 3 times per day.  6. Thrombocytopenia. Due to Votrient continues to be stable.  7. Followup: Will be in 4 to 5 weeks.       Hunter Hardin 3/6/201510:18 AM

## 2014-03-07 ENCOUNTER — Other Ambulatory Visit: Payer: Self-pay | Admitting: Family Medicine

## 2014-03-14 ENCOUNTER — Other Ambulatory Visit (INDEPENDENT_AMBULATORY_CARE_PROVIDER_SITE_OTHER): Payer: Medicare Other

## 2014-03-14 ENCOUNTER — Other Ambulatory Visit: Payer: Self-pay | Admitting: Family Medicine

## 2014-03-14 DIAGNOSIS — E039 Hypothyroidism, unspecified: Secondary | ICD-10-CM

## 2014-03-14 LAB — TSH: TSH: 7.9 u[IU]/mL — ABNORMAL HIGH (ref 0.35–5.50)

## 2014-03-14 MED ORDER — LEVOTHYROXINE SODIUM 50 MCG PO TABS: ORAL_TABLET | ORAL | Status: DC

## 2014-03-16 ENCOUNTER — Other Ambulatory Visit: Payer: Self-pay | Admitting: Family Medicine

## 2014-03-17 ENCOUNTER — Other Ambulatory Visit: Payer: Self-pay | Admitting: Family Medicine

## 2014-03-18 NOTE — Telephone Encounter (Signed)
Sent!

## 2014-03-18 NOTE — Telephone Encounter (Signed)
To PCP

## 2014-04-02 ENCOUNTER — Encounter: Payer: Self-pay | Admitting: Family Medicine

## 2014-04-02 ENCOUNTER — Telehealth: Payer: Self-pay | Admitting: Radiology

## 2014-04-02 ENCOUNTER — Ambulatory Visit (INDEPENDENT_AMBULATORY_CARE_PROVIDER_SITE_OTHER): Payer: Medicare Other | Admitting: Family Medicine

## 2014-04-02 VITALS — BP 122/58 | HR 61 | Temp 97.5°F | Wt 150.2 lb

## 2014-04-02 DIAGNOSIS — H353190 Nonexudative age-related macular degeneration, unspecified eye, stage unspecified: Secondary | ICD-10-CM | POA: Diagnosis not present

## 2014-04-02 DIAGNOSIS — H1030 Unspecified acute conjunctivitis, unspecified eye: Secondary | ICD-10-CM | POA: Diagnosis not present

## 2014-04-02 DIAGNOSIS — H35039 Hypertensive retinopathy, unspecified eye: Secondary | ICD-10-CM | POA: Diagnosis not present

## 2014-04-02 DIAGNOSIS — R609 Edema, unspecified: Secondary | ICD-10-CM

## 2014-04-02 DIAGNOSIS — C649 Malignant neoplasm of unspecified kidney, except renal pelvis: Secondary | ICD-10-CM

## 2014-04-02 DIAGNOSIS — E876 Hypokalemia: Secondary | ICD-10-CM

## 2014-04-02 DIAGNOSIS — I10 Essential (primary) hypertension: Secondary | ICD-10-CM | POA: Diagnosis not present

## 2014-04-02 DIAGNOSIS — H2510 Age-related nuclear cataract, unspecified eye: Secondary | ICD-10-CM | POA: Diagnosis not present

## 2014-04-02 LAB — COMPREHENSIVE METABOLIC PANEL
ALBUMIN: 2.8 g/dL — AB (ref 3.5–5.2)
ALT: 21 U/L (ref 0–53)
AST: 25 U/L (ref 0–37)
Alkaline Phosphatase: 45 U/L (ref 39–117)
BUN: 14 mg/dL (ref 6–23)
CHLORIDE: 103 meq/L (ref 96–112)
CO2: 29 mEq/L (ref 19–32)
CREATININE: 0.8 mg/dL (ref 0.4–1.5)
Calcium: 7.3 mg/dL — ABNORMAL LOW (ref 8.4–10.5)
GFR: 98.28 mL/min (ref >60.00)
GLUCOSE: 78 mg/dL (ref 70–99)
Potassium: 2.7 mEq/L — CL (ref 3.5–5.1)
Sodium: 142 mEq/L (ref 135–145)
Total Bilirubin: 0.6 mg/dL (ref 0.3–1.2)
Total Protein: 6.1 g/dL (ref 6.0–8.3)

## 2014-04-02 LAB — CBC WITH DIFFERENTIAL/PLATELET
Basophils Absolute: 0 10*3/uL (ref 0.0–0.1)
Basophils Relative: 0.3 % (ref 0.0–3.0)
EOS PCT: 3.4 % (ref 0.0–5.0)
Eosinophils Absolute: 0.2 10*3/uL (ref 0.0–0.7)
HCT: 31.3 % — ABNORMAL LOW (ref 39.0–52.0)
Hemoglobin: 10.6 g/dL — ABNORMAL LOW (ref 13.0–17.0)
LYMPHS PCT: 44.2 % (ref 12.0–46.0)
Lymphs Abs: 2.5 10*3/uL (ref 0.7–4.0)
MCHC: 33.8 g/dL (ref 30.0–36.0)
MCV: 106.3 fl — ABNORMAL HIGH (ref 78.0–100.0)
Monocytes Absolute: 0.4 10*3/uL (ref 0.1–1.0)
Monocytes Relative: 7.6 % (ref 3.0–12.0)
NEUTROS ABS: 2.5 10*3/uL (ref 1.4–7.7)
NEUTROS PCT: 44.5 % (ref 43.0–77.0)
Platelets: 126 10*3/uL — ABNORMAL LOW (ref 150.0–400.0)
RBC: 2.95 Mil/uL — AB (ref 4.22–5.81)
RDW: 15.4 % — ABNORMAL HIGH (ref 11.5–14.6)
WBC: 5.7 10*3/uL (ref 4.5–10.5)

## 2014-04-02 MED ORDER — POTASSIUM CHLORIDE CRYS ER 20 MEQ PO TBCR: 20.0000 meq | EXTENDED_RELEASE_TABLET | Freq: Two times a day (BID) | ORAL | Status: DC

## 2014-04-02 NOTE — Telephone Encounter (Signed)
Please call pt.  K was low. I want him to take extra K for the next few days and then recheck a repeat level here in a few days.  I will notify onc about his labs and the edema in the meantime.  His other labs were near baseline o/w. Thanks.  BMET order is in.

## 2014-04-02 NOTE — Telephone Encounter (Signed)
Patient advised.  Lab appt scheduled.  Rx sent to pharmacy.

## 2014-04-02 NOTE — Telephone Encounter (Signed)
[facility] called a critical K+ - 2.7. Results given to Dr Damita Dunnings.

## 2014-04-02 NOTE — Patient Instructions (Signed)
Go to the lab on the way.  We'll be in touch.  Don't change your meds for now.  Don't use any extra salt.  Take care.

## 2014-04-02 NOTE — Progress Notes (Signed)
Pre visit review using our clinic review tool, if applicable. No additional management support is needed unless otherwise documented below in the visit note.  His votrient was cut back to 3 tabs a day.  On megace and BP medicine at baseline.  Still with diarrhea, ever since the partial colectomy, stable per patient.  Now with BLE edema for the last few weeks.  Noted throughout the day, not variable per patient. Not SOB.  No CP.  No salt loading.  Weight is up, unclear how much is from fluid.  Still sleeping on 1 pillow.   Meds, vitals, and allergies reviewed.   ROS: See HPI.  Otherwise, noncontributory.  nad ncat Mmm rrr ctab abd soft Ext with 1+ edema

## 2014-04-03 DIAGNOSIS — R609 Edema, unspecified: Secondary | ICD-10-CM | POA: Insufficient documentation

## 2014-04-03 NOTE — Assessment & Plan Note (Signed)
Likely multifactorial, I sent a note to onc as a FYI. Wouldn't change meds at this point other than treat the low K noted on labs and have him repeat a BMET here later in the week. Okay for outpatient f/u.  Chest is clear.  Nontoxic.

## 2014-04-06 ENCOUNTER — Other Ambulatory Visit (INDEPENDENT_AMBULATORY_CARE_PROVIDER_SITE_OTHER): Payer: Medicare Other

## 2014-04-06 ENCOUNTER — Other Ambulatory Visit: Payer: Self-pay | Admitting: Oncology

## 2014-04-06 DIAGNOSIS — E876 Hypokalemia: Secondary | ICD-10-CM

## 2014-04-06 LAB — BASIC METABOLIC PANEL
BUN: 13 mg/dL (ref 6–23)
CHLORIDE: 109 meq/L (ref 96–112)
CO2: 29 mEq/L (ref 19–32)
Calcium: 7.5 mg/dL — ABNORMAL LOW (ref 8.4–10.5)
Creatinine, Ser: 0.8 mg/dL (ref 0.4–1.5)
GFR: 99.7 mL/min (ref >60.00)
Glucose, Bld: 91 mg/dL (ref 70–99)
POTASSIUM: 3.2 meq/L — AB (ref 3.5–5.1)
Sodium: 144 mEq/L (ref 135–145)

## 2014-04-09 ENCOUNTER — Other Ambulatory Visit: Payer: Self-pay | Admitting: *Deleted

## 2014-04-17 ENCOUNTER — Telehealth: Payer: Self-pay | Admitting: Oncology

## 2014-04-17 ENCOUNTER — Other Ambulatory Visit: Payer: Self-pay | Admitting: Oncology

## 2014-04-17 ENCOUNTER — Encounter: Payer: Self-pay | Admitting: Oncology

## 2014-04-17 ENCOUNTER — Other Ambulatory Visit (HOSPITAL_BASED_OUTPATIENT_CLINIC_OR_DEPARTMENT_OTHER): Payer: Medicare Other

## 2014-04-17 ENCOUNTER — Ambulatory Visit (HOSPITAL_BASED_OUTPATIENT_CLINIC_OR_DEPARTMENT_OTHER): Payer: Medicare Other | Admitting: Oncology

## 2014-04-17 VITALS — BP 105/85 | HR 56 | Temp 97.3°F | Resp 18 | Ht 66.0 in | Wt 147.2 lb

## 2014-04-17 DIAGNOSIS — C649 Malignant neoplasm of unspecified kidney, except renal pelvis: Secondary | ICD-10-CM

## 2014-04-17 DIAGNOSIS — D696 Thrombocytopenia, unspecified: Secondary | ICD-10-CM | POA: Diagnosis not present

## 2014-04-17 DIAGNOSIS — E43 Unspecified severe protein-calorie malnutrition: Secondary | ICD-10-CM

## 2014-04-17 DIAGNOSIS — C189 Malignant neoplasm of colon, unspecified: Secondary | ICD-10-CM

## 2014-04-17 DIAGNOSIS — R634 Abnormal weight loss: Secondary | ICD-10-CM | POA: Diagnosis not present

## 2014-04-17 DIAGNOSIS — R197 Diarrhea, unspecified: Secondary | ICD-10-CM | POA: Diagnosis not present

## 2014-04-17 LAB — COMPREHENSIVE METABOLIC PANEL (CC13)
ALK PHOS: 52 U/L (ref 40–150)
ALT: 13 U/L (ref 0–55)
ANION GAP: 8 meq/L (ref 3–11)
AST: 18 U/L (ref 5–34)
Albumin: 2.8 g/dL — ABNORMAL LOW (ref 3.5–5.0)
BILIRUBIN TOTAL: 0.63 mg/dL (ref 0.20–1.20)
BUN: 17 mg/dL (ref 7.0–26.0)
CO2: 24 meq/L (ref 22–29)
CREATININE: 0.9 mg/dL (ref 0.7–1.3)
Calcium: 8.5 mg/dL (ref 8.4–10.4)
Chloride: 111 mEq/L — ABNORMAL HIGH (ref 98–109)
Glucose: 97 mg/dl (ref 70–140)
Potassium: 4 mEq/L (ref 3.5–5.1)
SODIUM: 144 meq/L (ref 136–145)
Total Protein: 6 g/dL — ABNORMAL LOW (ref 6.4–8.3)

## 2014-04-17 LAB — CBC WITH DIFFERENTIAL/PLATELET
BASO%: 0.1 % (ref 0.0–2.0)
Basophils Absolute: 0 10*3/uL (ref 0.0–0.1)
EOS%: 4.3 % (ref 0.0–7.0)
Eosinophils Absolute: 0.2 10*3/uL (ref 0.0–0.5)
HEMATOCRIT: 31.9 % — AB (ref 38.4–49.9)
HGB: 10.6 g/dL — ABNORMAL LOW (ref 13.0–17.1)
LYMPH%: 45.6 % (ref 14.0–49.0)
MCH: 35.8 pg — ABNORMAL HIGH (ref 27.2–33.4)
MCHC: 33.3 g/dL (ref 32.0–36.0)
MCV: 107.5 fL — AB (ref 79.3–98.0)
MONO#: 0.4 10*3/uL (ref 0.1–0.9)
MONO%: 6.1 % (ref 0.0–14.0)
NEUT#: 2.5 10*3/uL (ref 1.5–6.5)
NEUT%: 43.9 % (ref 39.0–75.0)
PLATELETS: 119 10*3/uL — AB (ref 140–400)
RBC: 2.97 10*6/uL — AB (ref 4.20–5.82)
RDW: 14.5 % (ref 11.0–14.6)
WBC: 5.8 10*3/uL (ref 4.0–10.3)
lymph#: 2.6 10*3/uL (ref 0.9–3.3)

## 2014-04-17 NOTE — Telephone Encounter (Signed)
gv adn printed appt sched and avs for pt for may and June....gv pt barium

## 2014-04-17 NOTE — Progress Notes (Signed)
Hematology and Oncology Follow Up Visit  Hunter Hardin [id number] [birth year] 79 y.o. 04/17/2014 9:13 AM   Principle Diagnosis: 79 year old with: 1. T3 N0 colon cancer: He is status post Hunter laparoscopic right hemicolectomy, with 0 out of 21 lymph nodes involved. That was done on 03/06/2013. 2. Kidney mass and lung lesions. Work up is ongoing.   Current therapy: Started Votrient 800 mg daily on 05/22/2013. Dose was reduced to 600 mg daily on 01/19/14 due to weight loss and diarrhea.  Interim History: Hunter Hardin presents today for Hunter follow up visit. He has been on Votrient since 05/2013 and tolerated well now. He reports doing better since the dose reduction. Reports continued diarrhea, but only need one Imodium tablet per day. Weight has stabilized and continue drinking 2-3 cans of Ensure or Boost daily . Denies chest pain, shortness of breath, dyspnea on exertion. Denies abdominal pain. He does not report any hematochezia. Performance status and activity level are actually improving and getting close to baseline. He has not reported any hospitalization or illnesses.He has not reported any pain in his hands or feet. Has not reported any fevers or chills or sweats. He has not reported any headaches blurred vision or double vision or seizure activity. He has not reported any chest pain or palpitation. He does not report any hematochezia or melena. He has not reported any lymphadenopathy or petechiae. Has not reported any skin rashes or skeletal complaint.   Medications: I have reviewed the patient's current medications.  Current Outpatient Prescriptions  Medication Sig Dispense Refill  . diphenoxylate-atropine (LOMOTIL) 2.5-0.025 MG per tablet Take 1 tablet by mouth 4 (four) times daily as needed for diarrhea or loose stools.  30 tablet  0  . doxazosin (CARDURA) 8 MG tablet TAKE 1 TABLET DAILY  90 tablet  1  . fish oil-omega-3 fatty acids 1000 MG capsule Take 1 g by mouth 2 (two) times daily.       . hydrochlorothiazide (HYDRODIURIL) 25 MG tablet TAKE 1 TABLET DAILY  90 tablet  1  . lactose free nutrition (BOOST) LIQD Take 237 mLs by mouth daily.      [name] levothyroxine (SYNTHROID, LEVOTHROID) 50 MCG tablet 2 tabs on Sundays and Wednesdays, 1 tab on other days.  9 tabs per week total.      . megestrol (MEGACE) 400 MG/10ML suspension Take 10 mLs (400 mg total) by mouth 4 (four) times daily.  240 mL  1  . ondansetron (ZOFRAN) 8 MG tablet Take 1 tablet (8 mg total) by mouth every 8 (eight) hours as needed for nausea.  20 tablet  2  . potassium chloride SA (K-DUR,KLOR-CON) 20 MEQ tablet Take 1 tablet (20 mEq total) by mouth 2 (two) times daily.  20 tablet  1  . propranolol (INDERAL) 40 MG tablet Take 1 tablet (40 mg total) by mouth 2 (two) times daily.  180 tablet  1  . verapamil (CALAN-SR) 240 MG CR tablet TAKE 1 TABLET DAILY  90 tablet  1  . vitamin C (ASCORBIC ACID) 500 MG tablet Take 500 mg by mouth daily.      [name] VOTRIENT 200 MG tablet TAKE FOUR TABLETS (800 MG) DAILY ON AN EMPTY STOMACH  120 tablet  0   No current facility-administered medications for this visit.    Allergies: No Known Allergies  Past Medical History, Surgical history, Social history, and Family History were reviewed and updated.  Review of Systems:  Remaining ROS negative.  Physical Exam:  Blood pressure 105/85, pulse 56, temperature 97.3 F (36.3 C), temperature source Oral, resp. rate 18, height 5\' 6"  (1.676 m), weight 147 lb 3.2 oz (66.769 kg). ECOG: 1 General appearance: alert awake without any distress Head: Normocephalic, without obvious abnormality, atraumatic Neck: no adenopathy, no carotid bruit, no JVD, supple, symmetrical, trachea midline and thyroid not enlarged, symmetric, no tenderness/mass/nodules Lymph nodes: Cervical, supraclavicular, and axillary nodes normal. Heart:regular rate and rhythm, S1, S2 normal, no murmur, click, rub or gallop Lung:chest clear, no wheezing, rales, normal symmetric air  entry Abdomen: soft, non-tender, without masses or organomegaly EXT:no erythema, induration, or nodules he has trace edema in his lower extremities.   Lab Results: Lab Results  Component Value Date   WBC 5.8 04/17/2014   HGB 10.6* 04/17/2014   HCT 31.9* 04/17/2014   MCV 107.5* 04/17/2014   PLT 119* 04/17/2014     Hunter 79 year old Hardin with the following issues.   1. T3N0 colon cancer: He is status post Hunter laparoscopic right hemicolectomy,  with 0 out of 21 lymph nodes involved. He did not receive adjuvant chemotherapy for colon cancer given that he is has stage IV kidney cancer.   2. Biopsy proven stage IV kidney cancer. He is currently on Votrient dose reduced to 600 mg daily. CT scan from 01/16/2014 continued to show good response to this medication. Recommend that he continue Votrient 600 mg daily. I will repeat his CT scan in May of 2015.  3. Weight loss: Weight has stabilized. Recommend that he continue to drink 3 cans of Ensure or Boost daily.  4. Diarrhea. Due to prior hemicolectomy versus Votrient. Continue Imodium as needed.  5. Protein calorie malnutrition. Continue Ensure or Boost 3 times per day.  6. Thrombocytopenia. Due to Votrient continues to be stable.  7. Followup: Will be in 4 to 5 weeks.       Hunter Hardin 4/28/20159:13 AM

## 2014-04-27 DIAGNOSIS — L719 Rosacea, unspecified: Secondary | ICD-10-CM | POA: Diagnosis not present

## 2014-04-27 DIAGNOSIS — D692 Other nonthrombocytopenic purpura: Secondary | ICD-10-CM | POA: Diagnosis not present

## 2014-04-27 DIAGNOSIS — L570 Actinic keratosis: Secondary | ICD-10-CM | POA: Diagnosis not present

## 2014-04-27 DIAGNOSIS — R21 Rash and other nonspecific skin eruption: Secondary | ICD-10-CM | POA: Diagnosis not present

## 2014-05-02 ENCOUNTER — Other Ambulatory Visit: Payer: Self-pay | Admitting: *Deleted

## 2014-05-02 ENCOUNTER — Other Ambulatory Visit: Payer: Self-pay | Admitting: Family Medicine

## 2014-05-02 DIAGNOSIS — C649 Malignant neoplasm of unspecified kidney, except renal pelvis: Secondary | ICD-10-CM

## 2014-05-02 MED ORDER — POTASSIUM CHLORIDE CRYS ER 20 MEQ PO TBCR: 20.0000 meq | EXTENDED_RELEASE_TABLET | Freq: Two times a day (BID) | ORAL | Status: DC

## 2014-05-02 MED ORDER — PAZOPANIB HCL 200 MG PO TABS: 600.0000 mg | ORAL_TABLET | Freq: Every day | ORAL | Status: DC

## 2014-05-08 ENCOUNTER — Encounter: Payer: Self-pay | Admitting: Oncology

## 2014-05-08 ENCOUNTER — Telehealth: Payer: Self-pay | Admitting: Oncology

## 2014-05-08 NOTE — Progress Notes (Signed)
Faxed votrient pa form to [facility]

## 2014-05-08 NOTE — Telephone Encounter (Signed)
pt came today and changed date of lab

## 2014-05-16 ENCOUNTER — Other Ambulatory Visit: Payer: Medicare Other

## 2014-05-16 ENCOUNTER — Other Ambulatory Visit: Payer: Self-pay | Admitting: Oncology

## 2014-05-16 ENCOUNTER — Ambulatory Visit (HOSPITAL_COMMUNITY)
Admission: RE | Admit: 2014-05-16 | Discharge: 2014-05-16 | Disposition: A | Discharge status: Home / Self Care | Payer: Medicare Other | Source: Ambulatory Visit | Attending: Oncology | Admitting: Oncology

## 2014-05-16 ENCOUNTER — Ambulatory Visit (HOSPITAL_COMMUNITY): Payer: Medicare Other

## 2014-05-16 ENCOUNTER — Encounter (HOSPITAL_COMMUNITY): Payer: Self-pay

## 2014-05-16 DIAGNOSIS — Z9049 Acquired absence of other specified parts of digestive tract: Secondary | ICD-10-CM | POA: Diagnosis not present

## 2014-05-16 DIAGNOSIS — R918 Other nonspecific abnormal finding of lung field: Secondary | ICD-10-CM | POA: Diagnosis not present

## 2014-05-16 DIAGNOSIS — R599 Enlarged lymph nodes, unspecified: Secondary | ICD-10-CM | POA: Diagnosis not present

## 2014-05-16 DIAGNOSIS — C189 Malignant neoplasm of colon, unspecified: Secondary | ICD-10-CM

## 2014-05-16 DIAGNOSIS — J90 Pleural effusion, not elsewhere classified: Secondary | ICD-10-CM | POA: Diagnosis not present

## 2014-05-16 DIAGNOSIS — M899 Disorder of bone, unspecified: Secondary | ICD-10-CM | POA: Diagnosis not present

## 2014-05-16 DIAGNOSIS — N400 Enlarged prostate without lower urinary tract symptoms: Secondary | ICD-10-CM | POA: Diagnosis not present

## 2014-05-16 DIAGNOSIS — C787 Secondary malignant neoplasm of liver and intrahepatic bile duct: Secondary | ICD-10-CM | POA: Insufficient documentation

## 2014-05-16 DIAGNOSIS — C649 Malignant neoplasm of unspecified kidney, except renal pelvis: Secondary | ICD-10-CM | POA: Diagnosis not present

## 2014-05-16 DIAGNOSIS — I700 Atherosclerosis of aorta: Secondary | ICD-10-CM | POA: Insufficient documentation

## 2014-05-16 DIAGNOSIS — R911 Solitary pulmonary nodule: Secondary | ICD-10-CM | POA: Diagnosis not present

## 2014-05-16 DIAGNOSIS — M47817 Spondylosis without myelopathy or radiculopathy, lumbosacral region: Secondary | ICD-10-CM | POA: Insufficient documentation

## 2014-05-16 DIAGNOSIS — M949 Disorder of cartilage, unspecified: Secondary | ICD-10-CM

## 2014-05-16 DIAGNOSIS — I2510 Atherosclerotic heart disease of native coronary artery without angina pectoris: Secondary | ICD-10-CM | POA: Diagnosis not present

## 2014-05-16 DIAGNOSIS — R609 Edema, unspecified: Secondary | ICD-10-CM | POA: Insufficient documentation

## 2014-05-16 DIAGNOSIS — N281 Cyst of kidney, acquired: Secondary | ICD-10-CM | POA: Insufficient documentation

## 2014-05-16 DIAGNOSIS — M47814 Spondylosis without myelopathy or radiculopathy, thoracic region: Secondary | ICD-10-CM | POA: Insufficient documentation

## 2014-05-16 DIAGNOSIS — C7800 Secondary malignant neoplasm of unspecified lung: Secondary | ICD-10-CM | POA: Diagnosis not present

## 2014-05-16 LAB — CBC WITH DIFFERENTIAL/PLATELET
BASO%: 0.2 % (ref 0.0–2.0)
Basophils Absolute: 0 10*3/uL (ref 0.0–0.1)
EOS%: 4.5 % (ref 0.0–7.0)
Eosinophils Absolute: 0.3 10*3/uL (ref 0.0–0.5)
HEMATOCRIT: 36 % — AB (ref 38.4–49.9)
HGB: 11.9 g/dL — ABNORMAL LOW (ref 13.0–17.1)
LYMPH%: 39.9 % (ref 14.0–49.0)
MCH: 34.6 pg — AB (ref 27.2–33.4)
MCHC: 33.1 g/dL (ref 32.0–36.0)
MCV: 104.4 fL — ABNORMAL HIGH (ref 79.3–98.0)
MONO#: 0.5 10*3/uL (ref 0.1–0.9)
MONO%: 7.6 % (ref 0.0–14.0)
NEUT#: 3.4 10*3/uL (ref 1.5–6.5)
NEUT%: 47.8 % (ref 39.0–75.0)
Platelets: 142 10*3/uL (ref 140–400)
RBC: 3.45 10*6/uL — AB (ref 4.20–5.82)
RDW: 13.8 % (ref 11.0–14.6)
WBC: 7.1 10*3/uL (ref 4.0–10.3)
lymph#: 2.8 10*3/uL (ref 0.9–3.3)

## 2014-05-16 LAB — COMPREHENSIVE METABOLIC PANEL (CC13)
ALT: 16 U/L (ref 0–55)
ANION GAP: 11 meq/L (ref 3–11)
AST: 18 U/L (ref 5–34)
Albumin: 3.1 g/dL — ABNORMAL LOW (ref 3.5–5.0)
Alkaline Phosphatase: 60 U/L (ref 40–150)
BUN: 19.6 mg/dL (ref 7.0–26.0)
CHLORIDE: 107 meq/L (ref 98–109)
CO2: 24 meq/L (ref 22–29)
Calcium: 8.9 mg/dL (ref 8.4–10.4)
Creatinine: 0.9 mg/dL (ref 0.7–1.3)
GLUCOSE: 89 mg/dL (ref 70–140)
Potassium: 3.5 mEq/L (ref 3.5–5.1)
SODIUM: 142 meq/L (ref 136–145)
TOTAL PROTEIN: 6.4 g/dL (ref 6.4–8.3)
Total Bilirubin: 0.52 mg/dL (ref 0.20–1.20)

## 2014-05-16 MED ORDER — IOHEXOL 300 MG/ML  SOLN
100.0000 mL | Freq: Once | INTRAMUSCULAR | Status: AC | PRN
  Administered 2014-05-16: 100 mL via INTRAVENOUS

## 2014-05-17 ENCOUNTER — Other Ambulatory Visit: Payer: Medicare Other

## 2014-05-23 ENCOUNTER — Ambulatory Visit (HOSPITAL_BASED_OUTPATIENT_CLINIC_OR_DEPARTMENT_OTHER): Payer: Medicare Other | Admitting: Oncology

## 2014-05-23 ENCOUNTER — Encounter: Payer: Self-pay | Admitting: Oncology

## 2014-05-23 ENCOUNTER — Telehealth: Payer: Self-pay | Admitting: Oncology

## 2014-05-23 VITALS — BP 135/63 | HR 54 | Temp 97.3°F | Resp 18 | Ht 66.0 in | Wt 141.5 lb

## 2014-05-23 DIAGNOSIS — C189 Malignant neoplasm of colon, unspecified: Secondary | ICD-10-CM

## 2014-05-23 DIAGNOSIS — D696 Thrombocytopenia, unspecified: Secondary | ICD-10-CM | POA: Diagnosis not present

## 2014-05-23 DIAGNOSIS — C649 Malignant neoplasm of unspecified kidney, except renal pelvis: Secondary | ICD-10-CM

## 2014-05-23 DIAGNOSIS — R634 Abnormal weight loss: Secondary | ICD-10-CM | POA: Diagnosis not present

## 2014-05-23 DIAGNOSIS — R197 Diarrhea, unspecified: Secondary | ICD-10-CM | POA: Diagnosis not present

## 2014-05-23 NOTE — Progress Notes (Signed)
Hematology and Oncology Follow Up Visit  Hunter Hardin [id number] [birth year] 79 y.o. 05/23/2014 8:25 AM   Principle Diagnosis: 79 year old with:  1. T3 N0 colon cancer: He is status post a laparoscopic right hemicolectomy, with 0 out of 21 lymph nodes involved. That was done on 03/06/2013. 2. Renal cell carcinoma diagnosed in May of 2014. He presented with kidney mass and lung metastasis. 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  Current therapy: Started Votrient 800 mg daily on 05/22/2013. Dose was reduced to 600 mg daily on 01/19/14 due to weight loss and diarrhea.  Interim History: Mr. Dooly presents today for a follow up visit. He has been on Votrient since 05/2013 but the dose was reduced in January 2015 and have done better with that.  Since the last visit, he reports continued diarrhea, but only need one Imodium tablet per day and seems to be under reasonable control. His weight has stabilized with dose reduction and drinking 2-3 cans of Ensure or Boost daily . He also enjoys regular food whatever is available to him and has reasonable appetite. HedDenies chest pain, shortness of breath, dyspnea on exertion. He denies abdominal pain. He does not report any hematochezia. Performance status and activity level are actually improving and getting close to baseline. He has not reported any hospitalization or illnesses.He has not reported any pain in his hands or feet. Has not reported any fevers or chills or sweats. He does not report any changes in his multiple status, headaches, blurred vision or seizures. He does not report any frequency urgency or hesitancy. Does not report any hematuria or dysuria. He does not report any pain especially in any bony structures. He denies any back pain or hip pain. He is not report any lymphadenopathy or petechiae but developed a skin lesion on his face and following with dermatology at  this time. Rest of his review of systems unremarkable.   Medications: I have reviewed the patient's current medications. Reviewed today and unchanged. Current Outpatient Prescriptions  Medication Sig Dispense Refill  . diphenoxylate-atropine (LOMOTIL) 2.5-0.025 MG per tablet Take 1 tablet by mouth 4 (four) times daily as needed for diarrhea or loose stools.  30 tablet  0  . doxazosin (CARDURA) 8 MG tablet TAKE 1 TABLET DAILY  90 tablet  1  . fish oil-omega-3 fatty acids 1000 MG capsule Take 1 g by mouth 2 (two) times daily.      . hydrochlorothiazide (HYDRODIURIL) 25 MG tablet TAKE 1 TABLET DAILY  90 tablet  1  . lactose free nutrition (BOOST) LIQD Take 237 mLs by mouth daily.      [name] levothyroxine (SYNTHROID, LEVOTHROID) 50 MCG tablet 2 tabs on Sundays and Wednesdays, 1 tab on other days.  9 tabs per week total.      . megestrol (MEGACE) 400 MG/10ML suspension Take 10 mLs (400 mg total) by mouth 4 (four) times daily.  240 mL  1  . ondansetron (ZOFRAN) 8 MG tablet Take 1 tablet (8 mg total) by mouth every 8 (eight) hours as needed for nausea.  20 tablet  2  . pazopanib (VOTRIENT) 200 MG tablet Take 3 tablets (600 mg total) by mouth daily. Take on an empty stomach.  270 tablet  0  . potassium chloride SA (K-DUR,KLOR-CON) 20 MEQ tablet Take 1 tablet (20 mEq total) by mouth 2 (two) times daily.  60 tablet  2  . propranolol (INDERAL)  40 MG tablet Take 1 tablet (40 mg total) by mouth 2 (two) times daily.  180 tablet  1  . verapamil (CALAN-SR) 240 MG CR tablet TAKE 1 TABLET DAILY  90 tablet  1  . vitamin C (ASCORBIC ACID) 500 MG tablet Take 500 mg by mouth daily.       No current facility-administered medications for this visit.    Allergies: No Known Allergies  Past Medical History, Surgical history, Social history, and Family History were reviewed and updated.    Physical Exam: Blood pressure 135/63, pulse 54, temperature 97.3 F (36.3 C), temperature source Oral, resp. rate 18, height 5'  6" (1.676 m), weight 141 lb 8 oz (64.184 kg). ECOG: 1 General appearance: alert awake not in any distress Head: Normocephalic, without obvious abnormality Neck: no adenopathy, no masses or lesions. Lymph nodes: Cervical, supraclavicular, and axillary nodes normal. Heart:regular rate and rhythm, S1, S2.  Lung:chest clear, no wheezing, rales, normal symmetric air entry no dullness to percussion. Abdomen: soft, non-tender, without masses or organomegaly no shifting dullness or ascites. EXT:no erythema, induration, or nodules Skin: Diffuse erythema on the left temple but no bleeding or desquamation.   Lab Results: Lab Results  Component Value Date   WBC 7.1 05/16/2014   HGB 11.9* 05/16/2014   HCT 36.0* 05/16/2014   MCV 104.4* 05/16/2014   PLT 142 05/16/2014     EXAM:  CT CHEST WITH CONTRAST  CT ABDOMEN AND PELVIS WITH AND WITHOUT CONTRAST  TECHNIQUE:  Multidetector CT imaging of the chest was performed during  intravenous contrast administration. Multidetector CT imaging of the  abdomen and pelvis was performed following the standard protocol  before and during bolus administration of intravenous contrast.  CONTRAST: 133mL OMNIPAQUE IOHEXOL 300 MG/ML SOLN  COMPARISON: 01/16/2014  FINDINGS:  CT CHEST FINDINGS  2.1 x 2.6 cm partially cavitary posterior left lower lobe nodule  (series 11/image 62), previously measuring 1.6 x 1.8 cm with  dominant cystic component (series 9/image 9), increased.  5-6 mm nodules in the medial left upper lobe (series 11/ image 21),  superior segment left lower lobe (series 311/image 33), and  subpleural left lower lobe (series 11/ image 62), grossly unchanged.  Underlying chronic interstitial markings with subpleural  reticulation/ fibrosis. Trace bilateral pleural effusions, similar.  No pneumothorax.  Visualized thyroid is unremarkable.  The heart is top-normal in size. No pericardial effusion. Coronary  atherosclerosis. Atherosclerotic calcifications  of the aortic arch.  Thoracic lymphadenopathy, mildly improved, including:  --8 mm short axis left hilar node (series 7/ image 41), previously  10 mm  --16 mm short axis right hilar node (series 7/image 46), unchanged  --10 mm short axis subcarinal/paraesophageal node (series 7/ image  48), previously 11 mm  Mild degenerative changes at thoracic spine. No focal osseous lesion  is seen.  CT ABDOMEN AND PELVIS FINDINGS  Liver is notable for mild periportal edema but is otherwise  unremarkable.  Spleen and adrenal glands are within normal limits.  Fluid along the second portion of the duodenum and adjacent  pancreaticoduodenal groove (series 7/image 103). This appearance may  reflect groove pancreatitis and/or duodenitis.  Status post cholecystectomy. No intrahepatic or extrahepatic ductal  dilatation.  4.4 x 3.7 cm complex, centrally necrotic mass along the lateral left  upper kidney (series 7/image 97), measuring at the upper limits of  normal for simple fluid density on the current study (although  notable for solid enhancement on the prior), with a thickened,  enhancing medial rim. This  appearance is compatible with known renal  cell cancer, likely with superimposed treatment effect.  Right kidney is notable for a 10 x 11 mm posterior right lower pole  renal cyst. No hydronephrosis.  Status post right hemicolectomy with suture line in the right mid  abdomen (series 7/ image 123). No evidence of bowel obstruction.  Atherosclerotic calcifications of the abdominal aorta and branch  vessels.  No abdominopelvic ascites.  Small upper abdominal lymph nodes measuring up to 10 mm short axis  in the porta hepatis (series 7/ image 90), possibly reactive.  Prostatomegaly, with enlargement of the central gland indenting the  base of the bladder.  Bladder is mildly thick-walled although underdistended.  Degenerative changes of the lumbar spine, most prominent at L2-3.  Two small sclerotic  lesions in the right iliac bone (series 7/  images 149 and 154), unchanged from 2014, technically indeterminate  but possibly reflecting benign bone islands.  IMPRESSION:  Status post right hemicolectomy.  4.4 cm complex left upper pole renal mass, compatible with known  renal cell cancer, likely with superimposed treatment  effect/necrosis.  2.6 cm posterior left lower lobe metastasis, mildly increased.  Additional small left pulmonary nodules are grossly unchanged.  Thoracic lymphadenopathy, mildly decreased.  Possible groove pancreatitis and/or duodenitis.    A 79 year old gentleman with the following issues.   1. Colon cancer: He is status post a laparoscopic right hemicolectomy, with the pathology revealing a T3 N0, with 0 out of 21 lymph nodes involved. He did not receive adjuvant chemotherapy for colon cancer given that he has stage IV kidney cancer.   2. Kidney cancer: he has stage IV disease with lung involvment. He is currently on Votrient dose reduced to 600 mg daily. CT scan from 05/16/2014 was reviewed today and continued to show good response to this medication. The primary tumor appeared to be necrotic at the center without any new lesions detected. I recommend that he continue Votrient 600 mg daily. I will repeat his CT scan in 4 months.  3. Weight loss: Weight has stabilized. Recommend that he continue to drink 3 cans of Ensure or Boost daily. I will consider a further dose reduction his weight becomes an issue in the future.  4. Diarrhea. Due to prior hemicolectomy versus Votrient. Continue Imodium as needed. Seems to be under reasonable control per  5. Thrombocytopenia. Due to Votrient appears to have stabilized after the dose reduction.  6. Followup: Will be in 4 to 5 weeks.       [name] 6/3/20158:25 AM

## 2014-05-23 NOTE — Telephone Encounter (Signed)
gv and printed appt sched and avs fo rpt for July °

## 2014-06-08 DIAGNOSIS — L570 Actinic keratosis: Secondary | ICD-10-CM | POA: Diagnosis not present

## 2014-06-15 ENCOUNTER — Other Ambulatory Visit: Payer: Self-pay | Admitting: Family Medicine

## 2014-06-25 DIAGNOSIS — C649 Malignant neoplasm of unspecified kidney, except renal pelvis: Secondary | ICD-10-CM | POA: Diagnosis not present

## 2014-06-25 DIAGNOSIS — C801 Malignant (primary) neoplasm, unspecified: Secondary | ICD-10-CM | POA: Diagnosis not present

## 2014-06-25 DIAGNOSIS — N400 Enlarged prostate without lower urinary tract symptoms: Secondary | ICD-10-CM | POA: Diagnosis not present

## 2014-06-28 ENCOUNTER — Ambulatory Visit (HOSPITAL_BASED_OUTPATIENT_CLINIC_OR_DEPARTMENT_OTHER): Payer: Medicare Other | Admitting: Physician Assistant

## 2014-06-28 ENCOUNTER — Other Ambulatory Visit (HOSPITAL_BASED_OUTPATIENT_CLINIC_OR_DEPARTMENT_OTHER): Payer: Medicare Other

## 2014-06-28 ENCOUNTER — Encounter: Payer: Self-pay | Admitting: Physician Assistant

## 2014-06-28 ENCOUNTER — Telehealth: Payer: Self-pay | Admitting: Oncology

## 2014-06-28 VITALS — BP 122/64 | HR 46 | Temp 97.5°F | Resp 17 | Ht 66.0 in | Wt 139.3 lb

## 2014-06-28 DIAGNOSIS — C649 Malignant neoplasm of unspecified kidney, except renal pelvis: Secondary | ICD-10-CM

## 2014-06-28 DIAGNOSIS — R197 Diarrhea, unspecified: Secondary | ICD-10-CM | POA: Diagnosis not present

## 2014-06-28 DIAGNOSIS — D6959 Other secondary thrombocytopenia: Secondary | ICD-10-CM | POA: Diagnosis not present

## 2014-06-28 DIAGNOSIS — C7800 Secondary malignant neoplasm of unspecified lung: Secondary | ICD-10-CM

## 2014-06-28 DIAGNOSIS — C189 Malignant neoplasm of colon, unspecified: Secondary | ICD-10-CM

## 2014-06-28 DIAGNOSIS — E876 Hypokalemia: Secondary | ICD-10-CM | POA: Diagnosis not present

## 2014-06-28 DIAGNOSIS — R634 Abnormal weight loss: Secondary | ICD-10-CM | POA: Diagnosis not present

## 2014-06-28 LAB — CBC WITH DIFFERENTIAL/PLATELET
BASO%: 0.2 % (ref 0.0–2.0)
Basophils Absolute: 0 10*3/uL (ref 0.0–0.1)
EOS%: 2.5 % (ref 0.0–7.0)
Eosinophils Absolute: 0.2 10*3/uL (ref 0.0–0.5)
HCT: 36.7 % — ABNORMAL LOW (ref 38.4–49.9)
HGB: 12.5 g/dL — ABNORMAL LOW (ref 13.0–17.1)
LYMPH#: 3 10*3/uL (ref 0.9–3.3)
LYMPH%: 46.3 % (ref 14.0–49.0)
MCH: 34.1 pg — AB (ref 27.2–33.4)
MCHC: 34 g/dL (ref 32.0–36.0)
MCV: 100.2 fL — ABNORMAL HIGH (ref 79.3–98.0)
MONO#: 0.6 10*3/uL (ref 0.1–0.9)
MONO%: 8.8 % (ref 0.0–14.0)
NEUT#: 2.7 10*3/uL (ref 1.5–6.5)
NEUT%: 42.2 % (ref 39.0–75.0)
Platelets: 128 10*3/uL — ABNORMAL LOW (ref 140–400)
RBC: 3.66 10*6/uL — AB (ref 4.20–5.82)
RDW: 14.4 % (ref 11.0–14.6)
WBC: 6.4 10*3/uL (ref 4.0–10.3)

## 2014-06-28 LAB — COMPREHENSIVE METABOLIC PANEL (CC13)
ALT: 13 U/L (ref 0–55)
ANION GAP: 10 meq/L (ref 3–11)
AST: 19 U/L (ref 5–34)
Albumin: 3 g/dL — ABNORMAL LOW (ref 3.5–5.0)
Alkaline Phosphatase: 51 U/L (ref 40–150)
BUN: 19.5 mg/dL (ref 7.0–26.0)
CALCIUM: 9.1 mg/dL (ref 8.4–10.4)
CHLORIDE: 103 meq/L (ref 98–109)
CO2: 29 meq/L (ref 22–29)
Creatinine: 0.9 mg/dL (ref 0.7–1.3)
Glucose: 88 mg/dl (ref 70–140)
POTASSIUM: 2.8 meq/L — AB (ref 3.5–5.1)
SODIUM: 142 meq/L (ref 136–145)
TOTAL PROTEIN: 6.4 g/dL (ref 6.4–8.3)
Total Bilirubin: 0.98 mg/dL (ref 0.20–1.20)

## 2014-06-28 NOTE — Telephone Encounter (Signed)
gv and printed appt sched and avs for opt for Aug...MD out offfice week of 8.3

## 2014-06-28 NOTE — Progress Notes (Signed)
Hematology and Oncology Follow Up Visit  Hunter Hardin [id number] 1935/11/26 79 y.o. 06/28/2014 10:04 AM   Principle Diagnosis: 79 year old with:  1. T3 N0 colon cancer: He is status post a laparoscopic right hemicolectomy, with 0 out of 21 lymph nodes involved. That was done on 03/06/2013. 2. Renal cell carcinoma diagnosed in May of 2014. He presented with kidney mass and lung metastasis. 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  Current therapy: Started Votrient 800 mg daily on 05/22/2013. Dose was reduced to 600 mg daily on 01/19/14 due to weight loss and diarrhea.  Interim History: Hunter Hardin presents today for a follow up visit. He has been on Votrient since 05/2013 but the dose was reduced in January 2015 rehabilitation. He is doing much better on the reduced dose with improved appetite and weight gain. He continues to have some episodes of diarrhea but they are well managed with Imodium.  His weight is relatively stable with dose reduction and drinking 2-3 cans of Ensure or Boost daily . He also enjoys regular food whatever is available to him and has reasonable appetite. He denies chest pain, shortness of breath, dyspnea on exertion. He denies abdominal pain. He does not report any hematochezia. Performance status and activity level continue to improve and are getting close to baseline. He has not reported any hospitalization or illnesses.He has not reported any pain in his hands or feet. Has not reported any fevers or chills or sweats. He does not report any changes in his multiple status, headaches, blurred vision or seizures. He does not report any frequency urgency or hesitancy. Does not report any hematuria or dysuria. He does not report any pain especially in any bony structures. He denies any back pain or hip pain. He is not report any lymphadenopathy or petechiae but developed a skin lesion on his face and  following with dermatology at this time. He reports that his primary care physician, Dr. Damita Dunnings, put him on potassium supplement 20 mEq twice daily. He admits to not taking this at all in the past several days as he was concerned that the side effect profile included diarrhea. Rest of his review of systems unremarkable.   Medications: I have reviewed the patient's current medications. Reviewed today and unchanged. Current Outpatient Prescriptions  Medication Sig Dispense Refill  . diphenoxylate-atropine (LOMOTIL) 2.5-0.025 MG per tablet Take 1 tablet by mouth 4 (four) times daily as needed for diarrhea or loose stools.  30 tablet  0  . doxazosin (CARDURA) 8 MG tablet TAKE 1 TABLET DAILY  90 tablet  1  . fish oil-omega-3 fatty acids 1000 MG capsule Take 1 g by mouth 2 (two) times daily.      . hydrochlorothiazide (HYDRODIURIL) 25 MG tablet TAKE 1 TABLET DAILY  90 tablet  1  . lactose free nutrition (BOOST) LIQD Take 237 mLs by mouth daily.      [name] levothyroxine (SYNTHROID, LEVOTHROID) 50 MCG tablet 2 tabs on Sundays and Wednesdays, 1 tab on other days.  9 tabs per week total.      . ondansetron (ZOFRAN) 8 MG tablet Take 1 tablet (8 mg total) by mouth every 8 (eight) hours as needed for nausea.  20 tablet  2  . pazopanib (VOTRIENT) 200 MG tablet Take 3 tablets (600 mg total) by mouth daily. Take on an empty stomach.  270 tablet  0  . potassium chloride SA (K-DUR,KLOR-CON) 20  MEQ tablet Take 1 tablet (20 mEq total) by mouth 2 (two) times daily.  60 tablet  2  . propranolol (INDERAL) 40 MG tablet Take 1 tablet (40 mg total) by mouth 2 (two) times daily.  180 tablet  1  . verapamil (CALAN-SR) 240 MG CR tablet TAKE 1 TABLET DAILY  90 tablet  1  . vitamin C (ASCORBIC ACID) 500 MG tablet Take 500 mg by mouth daily.      . megestrol (MEGACE) 400 MG/10ML suspension Take 10 mLs (400 mg total) by mouth 4 (four) times daily.  240 mL  1   No current facility-administered medications for this visit.     Allergies: No Known Allergies  Past Medical History, Surgical history, Social history, and Family History were reviewed and updated.    Physical Exam: Blood pressure 122/64, pulse 46, temperature 97.5 F (36.4 C), temperature source Oral, resp. rate 17, height 5\' 6"  (1.676 m), weight 139 lb 4.8 oz (63.186 kg), SpO2 100.00%. ECOG: 1 General appearance: alert awake not in any distress Head: Normocephalic, without obvious abnormality Neck: no adenopathy, no masses or lesions. Lymph nodes: Cervical, supraclavicular, and axillary nodes normal. Heart:regular rate and rhythm, S1, S2.  Lung:chest clear, no wheezing, rales, normal symmetric air entry no dullness to percussion. Abdomen: soft, non-tender, without masses or organomegaly no shifting dullness or ascites. EXT:no erythema, induration, or nodules Skin: Diffuse erythema on the left temple but no bleeding or desquamation.   Lab Results: Lab Results  Component Value Date   WBC 6.4 06/28/2014   HGB 12.5* 06/28/2014   HCT 36.7* 06/28/2014   MCV 100.2* 06/28/2014   PLT 128* 06/28/2014     EXAM:  CT CHEST WITH CONTRAST  CT ABDOMEN AND PELVIS WITH AND WITHOUT CONTRAST  TECHNIQUE:  Multidetector CT imaging of the chest was performed during  intravenous contrast administration. Multidetector CT imaging of the  abdomen and pelvis was performed following the standard protocol  before and during bolus administration of intravenous contrast.  CONTRAST: 154mL OMNIPAQUE IOHEXOL 300 MG/ML SOLN  COMPARISON: 01/16/2014  FINDINGS:  CT CHEST FINDINGS  2.1 x 2.6 cm partially cavitary posterior left lower lobe nodule  (series 11/image 62), previously measuring 1.6 x 1.8 cm with  dominant cystic component (series 9/image 9), increased.  5-6 mm nodules in the medial left upper lobe (series 11/ image 21),  superior segment left lower lobe (series 311/image 33), and  subpleural left lower lobe (series 11/ image 62), grossly unchanged.  Underlying  chronic interstitial markings with subpleural  reticulation/ fibrosis. Trace bilateral pleural effusions, similar.  No pneumothorax.  Visualized thyroid is unremarkable.  The heart is top-normal in size. No pericardial effusion. Coronary  atherosclerosis. Atherosclerotic calcifications of the aortic arch.  Thoracic lymphadenopathy, mildly improved, including:  --8 mm short axis left hilar node (series 7/ image 41), previously  10 mm  --16 mm short axis right hilar node (series 7/image 46), unchanged  --10 mm short axis subcarinal/paraesophageal node (series 7/ image  48), previously 11 mm  Mild degenerative changes at thoracic spine. No focal osseous lesion  is seen.  CT ABDOMEN AND PELVIS FINDINGS  Liver is notable for mild periportal edema but is otherwise  unremarkable.  Spleen and adrenal glands are within normal limits.  Fluid along the second portion of the duodenum and adjacent  pancreaticoduodenal groove (series 7/image 103). This appearance may  reflect groove pancreatitis and/or duodenitis.  Status post cholecystectomy. No intrahepatic or extrahepatic ductal  dilatation.  4.4 x  3.7 cm complex, centrally necrotic mass along the lateral left  upper kidney (series 7/image 97), measuring at the upper limits of  normal for simple fluid density on the current study (although  notable for solid enhancement on the prior), with a thickened,  enhancing medial rim. This appearance is compatible with known renal  cell cancer, likely with superimposed treatment effect.  Right kidney is notable for a 10 x 11 mm posterior right lower pole  renal cyst. No hydronephrosis.  Status post right hemicolectomy with suture line in the right mid  abdomen (series 7/ image 123). No evidence of bowel obstruction.  Atherosclerotic calcifications of the abdominal aorta and branch  vessels.  No abdominopelvic ascites.  Small upper abdominal lymph nodes measuring up to 10 mm short axis  in the porta  hepatis (series 7/ image 90), possibly reactive.  Prostatomegaly, with enlargement of the central gland indenting the  base of the bladder.  Bladder is mildly thick-walled although underdistended.  Degenerative changes of the lumbar spine, most prominent at L2-3.  Two small sclerotic lesions in the right iliac bone (series 7/  images 149 and 154), unchanged from 2014, technically indeterminate  but possibly reflecting benign bone islands.  IMPRESSION:  Status post right hemicolectomy.  4.4 cm complex left upper pole renal mass, compatible with known  renal cell cancer, likely with superimposed treatment  effect/necrosis.  2.6 cm posterior left lower lobe metastasis, mildly increased.  Additional small left pulmonary nodules are grossly unchanged.  Thoracic lymphadenopathy, mildly decreased.  Possible groove pancreatitis and/or duodenitis.    A 79 year old gentleman with the following issues.   1. Colon cancer: He is status post a laparoscopic right hemicolectomy, with the pathology revealing a T3 N0, with 0 out of 21 lymph nodes involved. He did not receive adjuvant chemotherapy for colon cancer given that he has stage IV kidney cancer.   2. Kidney cancer: he has stage IV disease with lung involvment. He is currently on Votrient dose reduced to 600 mg daily. CT scan from 05/16/2014 continued to show good response to this medication. The primary tumor appeared to be necrotic at the center without any new lesions detected. He will continue Votrient 600 mg daily. The plan will be to repeat his CT scan in 3 months.  3. Weight loss: Weight has stabilized. Recommend that he continue to drink 3 cans of Ensure or Boost daily. Dr. Alen Blew will consider a further dose reduction if his weight becomes an issue in the future.  4. Diarrhea. Due to prior hemicolectomy versus Votrient. Continue Imodium as needed. Relatively stable   5. Thrombocytopenia. Due to Votrient remains stable after the dose  reduction.  6. hypokalemia-the patient's potassium level 2.8 mEq per liter today. This is likely  due in part to his not taking his potassium. He was advised to take his potassium supplement as prescribed. He does not need a refill for this medication. Patient was advised to break the potassium tablets in half and try swallowing a half tablet at a time. If this is still problematic, the patient was advised to crush the potassium in take it with a tablespoon of applesauce or pudding or something similar. He voiced understanding and appreciated the explanation of the importance of his potassium level and alternate ways to take this medication that would be easier for him.  6. Followup: Will be in 4 to 5 weeks.       BROGDEN, Bonnye Halle E 7/9/201510:04 AM

## 2014-06-28 NOTE — Patient Instructions (Signed)
Take your potassium as prescribed. Followup with your primary care physician approximately 2 weeks to have him recheck your potassium level Continue Votrient 600 mg by mouth daily Follow up in 1 month

## 2014-07-02 ENCOUNTER — Encounter: Payer: Self-pay | Admitting: Family Medicine

## 2014-07-02 ENCOUNTER — Ambulatory Visit (INDEPENDENT_AMBULATORY_CARE_PROVIDER_SITE_OTHER): Payer: Medicare Other | Admitting: Family Medicine

## 2014-07-02 VITALS — BP 132/70 | HR 56 | Temp 98.0°F | Wt 139.0 lb

## 2014-07-02 DIAGNOSIS — I10 Essential (primary) hypertension: Secondary | ICD-10-CM

## 2014-07-02 MED ORDER — PROPRANOLOL HCL 40 MG PO TABS: 20.0000 mg | ORAL_TABLET | Freq: Two times a day (BID) | ORAL | Status: DC

## 2014-07-02 NOTE — Progress Notes (Signed)
Pre visit review using our clinic review tool, if applicable. No additional management support is needed unless otherwise documented below in the visit note.  He is back on his K supplement; he had been off it prev.  That is being followed by oncology.    F/u for bradycardia. No CP, SOB, BLE edema.  No orthostatic sx.  Compliant with BP meds.  No feeling of skipped beats.    Meds, vitals, and allergies reviewed.   ROS: See HPI.  Otherwise, noncontributory.  nad ncat Mmm Neck supple, no LA RRR, borderline brady, no ectopy ctab abd soft Ext w/o edema No orthostatic sx on standing

## 2014-07-02 NOTE — Patient Instructions (Signed)
Cut the propranolol in half.   Check your pulse and notify me if still less than 60 or above 90. Take care.

## 2014-07-03 NOTE — Assessment & Plan Note (Signed)
Controlled, but now with bradycardia.  Dec BB by 50%, d/w pt.  He can check his pulse and notify us as needed. Okay for outpatient fu.  Likely just needs lower dose as his weight had trended down to his current level.

## 2014-07-16 ENCOUNTER — Ambulatory Visit (INDEPENDENT_AMBULATORY_CARE_PROVIDER_SITE_OTHER): Payer: Medicare Other | Admitting: General Surgery

## 2014-07-16 ENCOUNTER — Encounter (INDEPENDENT_AMBULATORY_CARE_PROVIDER_SITE_OTHER): Payer: Self-pay | Admitting: General Surgery

## 2014-07-16 VITALS — BP 114/76 | HR 53 | Temp 98.0°F | Ht 66.0 in | Wt 137.0 lb

## 2014-07-16 DIAGNOSIS — C189 Malignant neoplasm of colon, unspecified: Secondary | ICD-10-CM

## 2014-07-16 NOTE — Progress Notes (Signed)
Leotha Voeltz. is a 79 y.o. male who is here for a follow up visit regarding his colon cancer. He is status post an R colectomy on 03/06/13. This was complicated by a wound infection. His final pathology was T3N0. He is currently being treated for stage IV RCC. His weight has been stable. He is having some diarrhea (3-4 BM's/day) and taking imodium for this. It was decided not to do a f/u colonoscopy due to his stage 4 RCC.  Objective:  Filed Vitals:   07/16/14 1637  BP: 114/76  Pulse: 53  Temp: 98 F (36.7 C)    General appearance: alert and cooperative  Resp: clear to auscultation bilaterally  Cardio: regular rate and rhythm  GI: normal findings: soft, non-tender, no hernia  Assessment and Plan:  Halim Surrette. is a 79 y.o. M with synchronous colon and renal cancers. I will order a CEA.  I will see him back in 6 months. Continue imodium as needed.  Diarrhea may be contributing to hypokalemia.    Rosario Adie, [facility], [place]

## 2014-07-16 NOTE — Patient Instructions (Signed)
We have ordered a CEA level.  Return to the office in 6 months.  Continue imodium 2-4 capsules for diarrhea.

## 2014-07-18 ENCOUNTER — Telehealth (INDEPENDENT_AMBULATORY_CARE_PROVIDER_SITE_OTHER): Payer: Self-pay | Admitting: General Surgery

## 2014-07-18 LAB — CEA: CEA: 3.2 ng/mL (ref 0.0–5.0)

## 2014-07-18 NOTE — Telephone Encounter (Signed)
Message copied by Flossie Buffy on Wed Jul 18, 2014 12:57 PM ------      Message from: [place], Gifford Shave.      Created: Wed Jul 18, 2014 11:57 AM       Please let him know his CEA level was fine.            AT      ----- Message -----         From: [facility]         Sent: 07/18/2014   5:04 AM           To: Leighton Ruff, MD                   ------

## 2014-07-18 NOTE — Telephone Encounter (Signed)
LMOM asking pt to return my call.  This is so that I may let him know his CEA levels were within normal range.

## 2014-07-29 ENCOUNTER — Other Ambulatory Visit: Payer: Self-pay | Admitting: Family Medicine

## 2014-08-01 ENCOUNTER — Other Ambulatory Visit (HOSPITAL_BASED_OUTPATIENT_CLINIC_OR_DEPARTMENT_OTHER): Payer: Medicare Other

## 2014-08-01 ENCOUNTER — Other Ambulatory Visit: Payer: Self-pay | Admitting: *Deleted

## 2014-08-01 ENCOUNTER — Ambulatory Visit (HOSPITAL_BASED_OUTPATIENT_CLINIC_OR_DEPARTMENT_OTHER): Payer: Medicare Other | Admitting: Oncology

## 2014-08-01 ENCOUNTER — Telehealth: Payer: Self-pay | Admitting: Oncology

## 2014-08-01 ENCOUNTER — Encounter: Payer: Self-pay | Admitting: Oncology

## 2014-08-01 VITALS — BP 133/65 | HR 43 | Temp 97.2°F | Resp 18 | Ht 66.0 in | Wt 137.2 lb

## 2014-08-01 DIAGNOSIS — C189 Malignant neoplasm of colon, unspecified: Secondary | ICD-10-CM | POA: Diagnosis not present

## 2014-08-01 DIAGNOSIS — R634 Abnormal weight loss: Secondary | ICD-10-CM | POA: Diagnosis not present

## 2014-08-01 DIAGNOSIS — C649 Malignant neoplasm of unspecified kidney, except renal pelvis: Secondary | ICD-10-CM

## 2014-08-01 DIAGNOSIS — C7800 Secondary malignant neoplasm of unspecified lung: Secondary | ICD-10-CM | POA: Diagnosis not present

## 2014-08-01 DIAGNOSIS — D696 Thrombocytopenia, unspecified: Secondary | ICD-10-CM | POA: Diagnosis not present

## 2014-08-01 DIAGNOSIS — R197 Diarrhea, unspecified: Secondary | ICD-10-CM | POA: Diagnosis not present

## 2014-08-01 LAB — CBC WITH DIFFERENTIAL/PLATELET
BASO%: 0.2 % (ref 0.0–2.0)
BASOS ABS: 0 10*3/uL (ref 0.0–0.1)
EOS ABS: 0.2 10*3/uL (ref 0.0–0.5)
EOS%: 2.8 % (ref 0.0–7.0)
HCT: 36.6 % — ABNORMAL LOW (ref 38.4–49.9)
HGB: 12.1 g/dL — ABNORMAL LOW (ref 13.0–17.1)
LYMPH%: 52.2 % — AB (ref 14.0–49.0)
MCH: 34.1 pg — ABNORMAL HIGH (ref 27.2–33.4)
MCHC: 33.1 g/dL (ref 32.0–36.0)
MCV: 103 fL — AB (ref 79.3–98.0)
MONO#: 0.5 10*3/uL (ref 0.1–0.9)
MONO%: 7.5 % (ref 0.0–14.0)
NEUT%: 37.3 % — ABNORMAL LOW (ref 39.0–75.0)
NEUTROS ABS: 2.4 10*3/uL (ref 1.5–6.5)
Platelets: 143 10*3/uL (ref 140–400)
RBC: 3.55 10*6/uL — AB (ref 4.20–5.82)
RDW: 15.3 % — AB (ref 11.0–14.6)
WBC: 6.5 10*3/uL (ref 4.0–10.3)
lymph#: 3.4 10*3/uL — ABNORMAL HIGH (ref 0.9–3.3)

## 2014-08-01 LAB — COMPREHENSIVE METABOLIC PANEL (CC13)
ALT: 16 U/L (ref 0–55)
AST: 23 U/L (ref 5–34)
Albumin: 3.2 g/dL — ABNORMAL LOW (ref 3.5–5.0)
Alkaline Phosphatase: 54 U/L (ref 40–150)
Anion Gap: 8 mEq/L (ref 3–11)
BUN: 17.6 mg/dL (ref 7.0–26.0)
CO2: 24 mEq/L (ref 22–29)
Calcium: 9.3 mg/dL (ref 8.4–10.4)
Chloride: 109 mEq/L (ref 98–109)
Creatinine: 1 mg/dL (ref 0.7–1.3)
Glucose: 89 mg/dl (ref 70–140)
Potassium: 4.2 mEq/L (ref 3.5–5.1)
Sodium: 140 mEq/L (ref 136–145)
Total Bilirubin: 0.62 mg/dL (ref 0.20–1.20)
Total Protein: 6.9 g/dL (ref 6.4–8.3)

## 2014-08-01 NOTE — Telephone Encounter (Signed)
Received faxed refill request from pharmacy. Last refill 06/13/13 #20/2 refills, last office visit 07/02/14. Is it okay to refill medication?

## 2014-08-01 NOTE — Progress Notes (Signed)
Hematology and Oncology Follow Up Visit  Hunter Hardin [id number] Oct 17, 1935 79 y.o. 08/01/2014 8:37 AM   Principle Diagnosis: 79 year old gentleman with:  1. T3 N0 colon cancer: He is status post a laparoscopic right hemicolectomy, with 0 out of 21 lymph nodes involved. That was done on 03/06/2013. 2. Renal cell carcinoma diagnosed in May of 2014. He presented with kidney mass and lung metastasis. 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  Current therapy: Started Votrient 800 mg daily on 05/22/2013. Dose was reduced to 600 mg daily on 01/19/14 due to weight loss and diarrhea.  Interim History: Hunter Hardin presents today for a follow up visit.Since the last visit, he reports continued diarrhea but seems to be under reasonable control with the help of Imodium. His appetite seems to have improved as well. His weight has stabilized with dose reduction and drinking 2-3 cans of Ensure or Boost daily. He is tolerating the current dose of Votrient without any new complications. He denies chest pain, shortness of breath, dyspnea on exertion. He denies abdominal pain. He does not report any hematochezia. Performance status and activity level are actually improving and getting close to baseline. He has not reported any hospitalization or illnesses.He has not reported any pain in his hands or feet. Has not reported any fevers or chills or sweats. He does not report any changes in his multiple status, headaches, blurred vision or seizures. He does not report any frequency urgency or hesitancy. Does not report any hematuria or dysuria. He does not report any pain especially in any bony structures. He denies any back pain or hip pain. He is not report any lymphadenopathy or petechiae but developed a skin lesion on his face and following with dermatology at this time. Rest of his review of systems unremarkable.   Medications: I have  reviewed the patient's current medications. Reviewed today and unchanged. Current Outpatient Prescriptions  Medication Sig Dispense Refill  . diphenoxylate-atropine (LOMOTIL) 2.5-0.025 MG per tablet Take 1 tablet by mouth 4 (four) times daily as needed for diarrhea or loose stools.  30 tablet  0  . doxazosin (CARDURA) 8 MG tablet TAKE 1 TABLET DAILY  90 tablet  1  . fish oil-omega-3 fatty acids 1000 MG capsule Take 1 g by mouth 2 (two) times daily.      . hydrochlorothiazide (HYDRODIURIL) 25 MG tablet TAKE 1 TABLET DAILY  90 tablet  1  . lactose free nutrition (BOOST) LIQD Take 237 mLs by mouth daily.      [name] levothyroxine (SYNTHROID, LEVOTHROID) 50 MCG tablet 2 tabs on Sundays and Wednesdays, 1 tab on other days.  9 tabs per week total.      . ondansetron (ZOFRAN) 8 MG tablet Take 1 tablet (8 mg total) by mouth every 8 (eight) hours as needed for nausea.  20 tablet  2  . pazopanib (VOTRIENT) 200 MG tablet Take 3 tablets (600 mg total) by mouth daily. Take on an empty stomach.  270 tablet  0  . potassium chloride SA (K-DUR,KLOR-CON) 20 MEQ tablet Take 1 tablet (20 mEq total) by mouth 2 (two) times daily.  60 tablet  2  . propranolol (INDERAL) 40 MG tablet Take 0.5 tablets (20 mg total) by mouth 2 (two) times daily.      . verapamil (CALAN-SR) 240 MG CR tablet TAKE 1 TABLET DAILY  90 tablet  1  . vitamin C (ASCORBIC ACID) 500  MG tablet Take 500 mg by mouth daily.       No current facility-administered medications for this visit.    Allergies: No Known Allergies  Past Medical History, Surgical history, Social history, and Family History were reviewed and updated.    Physical Exam: Blood pressure 133/65, pulse 43, temperature 97.2 F (36.2 C), temperature source Oral, resp. rate 18, height 5\' 6"  (1.676 m), weight 137 lb 3.2 oz (62.234 kg). ECOG: 1 General appearance: alert awake not in any distress Head: Normocephalic, without obvious abnormality Neck: no adenopathy, no masses or  lesions. Lymph nodes: Cervical, supraclavicular, and axillary nodes normal. Heart:regular rate and rhythm, S1, S2. No murmurs rubs or gallops. Lung:chest clear, no wheezing, rales, no dullness to percussion. Abdomen: soft, non-tender, without masses or organomegaly no shifting dullness or ascites. EXT:no erythema, induration, or nodules Skin: No rashes or lesions noted today.   Lab Results: Lab Results  Component Value Date   WBC 6.5 08/01/2014   HGB 12.1* 08/01/2014   HCT 36.6* 08/01/2014   MCV 103.0* 08/01/2014   PLT 143 08/01/2014       A 79 year old gentleman with the following issues.   1. Colon cancer: He is status post a laparoscopic right hemicolectomy, with the pathology revealing a T3 N0, with 0 out of 21 lymph nodes involved. He did not receive adjuvant chemotherapy for colon cancer given that he has stage IV kidney cancer.   2. Kidney cancer: he has stage IV disease with lung involvment. He is currently on Votrient dose reduced to 600 mg daily. CT scan from 05/16/2014 showed good response to this medication. The primary tumor appeared to be necrotic at the center without any new lesions detected. I recommend that he continue Votrient 600 mg daily. I will repeat his CT scan in September of 2015.  3. Weight loss: Weight has stabilized. Recommend that he continue to drink 3 cans of Ensure or Boost daily.   4. Diarrhea. Due to prior hemicolectomy versus Votrient. Continue Imodium as needed. We will continue to monitor his electrolytes because of that. He is on potassium supplement as well.  5. Thrombocytopenia. Due to Votrient appears to have stabilized after the dose reduction.  6. Followup: Will be in 4 to 5 weeks after a CT scan.        [name] 8/12/20158:37 AM

## 2014-08-01 NOTE — Telephone Encounter (Signed)
Pt confirmed labs/ov per 08/12 POF, gave pt AVS....KJ

## 2014-08-02 MED ORDER — ONDANSETRON HCL 8 MG PO TABS: 8.0000 mg | ORAL_TABLET | Freq: Three times a day (TID) | ORAL | Status: DC | PRN

## 2014-08-02 NOTE — Telephone Encounter (Signed)
Okay to send, just verify desired pharmacy and then send as is.  Thanks.

## 2014-08-07 ENCOUNTER — Other Ambulatory Visit: Payer: Self-pay | Admitting: Family Medicine

## 2014-08-09 ENCOUNTER — Other Ambulatory Visit: Payer: Self-pay | Admitting: Family Medicine

## 2014-08-11 ENCOUNTER — Other Ambulatory Visit: Payer: Self-pay | Admitting: Oncology

## 2014-08-20 ENCOUNTER — Other Ambulatory Visit: Payer: Self-pay | Admitting: *Deleted

## 2014-08-20 DIAGNOSIS — C649 Malignant neoplasm of unspecified kidney, except renal pelvis: Secondary | ICD-10-CM

## 2014-08-20 MED ORDER — PAZOPANIB HCL 200 MG PO TABS: ORAL_TABLET | ORAL | Status: DC

## 2014-09-04 ENCOUNTER — Ambulatory Visit (HOSPITAL_COMMUNITY)
Admission: RE | Admit: 2014-09-04 | Discharge: 2014-09-04 | Disposition: A | Discharge status: Home / Self Care | Payer: Medicare Other | Source: Ambulatory Visit | Attending: Oncology | Admitting: Oncology

## 2014-09-04 ENCOUNTER — Other Ambulatory Visit (HOSPITAL_BASED_OUTPATIENT_CLINIC_OR_DEPARTMENT_OTHER): Payer: Medicare Other

## 2014-09-04 ENCOUNTER — Other Ambulatory Visit: Payer: Self-pay | Admitting: Oncology

## 2014-09-04 ENCOUNTER — Encounter (HOSPITAL_COMMUNITY): Payer: Self-pay

## 2014-09-04 DIAGNOSIS — C189 Malignant neoplasm of colon, unspecified: Secondary | ICD-10-CM

## 2014-09-04 DIAGNOSIS — C649 Malignant neoplasm of unspecified kidney, except renal pelvis: Secondary | ICD-10-CM

## 2014-09-04 LAB — CBC WITH DIFFERENTIAL/PLATELET
BASO%: 0.1 % (ref 0.0–2.0)
Basophils Absolute: 0 10*3/uL (ref 0.0–0.1)
EOS ABS: 0.2 10*3/uL (ref 0.0–0.5)
EOS%: 1.8 % (ref 0.0–7.0)
HCT: 38.8 % (ref 38.4–49.9)
HGB: 13 g/dL (ref 13.0–17.1)
LYMPH#: 4.5 10*3/uL — AB (ref 0.9–3.3)
LYMPH%: 54.9 % — ABNORMAL HIGH (ref 14.0–49.0)
MCH: 35 pg — ABNORMAL HIGH (ref 27.2–33.4)
MCHC: 33.5 g/dL (ref 32.0–36.0)
MCV: 104.6 fL — ABNORMAL HIGH (ref 79.3–98.0)
MONO#: 0.7 10*3/uL (ref 0.1–0.9)
MONO%: 8.1 % (ref 0.0–14.0)
NEUT%: 35.1 % — ABNORMAL LOW (ref 39.0–75.0)
NEUTROS ABS: 2.9 10*3/uL (ref 1.5–6.5)
Platelets: 117 10*3/uL — ABNORMAL LOW (ref 140–400)
RBC: 3.71 10*6/uL — AB (ref 4.20–5.82)
RDW: 14.9 % — ABNORMAL HIGH (ref 11.0–14.6)
WBC: 8.2 10*3/uL (ref 4.0–10.3)

## 2014-09-04 LAB — COMPREHENSIVE METABOLIC PANEL (CC13)
ALK PHOS: 56 U/L (ref 40–150)
ALT: 16 U/L (ref 0–55)
AST: 20 U/L (ref 5–34)
Albumin: 3.3 g/dL — ABNORMAL LOW (ref 3.5–5.0)
Anion Gap: 9 mEq/L (ref 3–11)
BUN: 21 mg/dL (ref 7.0–26.0)
CO2: 23 mEq/L (ref 22–29)
Calcium: 9.6 mg/dL (ref 8.4–10.4)
Chloride: 107 mEq/L (ref 98–109)
Creatinine: 1.3 mg/dL (ref 0.7–1.3)
Glucose: 89 mg/dl (ref 70–140)
POTASSIUM: 4.1 meq/L (ref 3.5–5.1)
Sodium: 139 mEq/L (ref 136–145)
Total Bilirubin: 0.75 mg/dL (ref 0.20–1.20)
Total Protein: 7.1 g/dL (ref 6.4–8.3)

## 2014-09-04 MED ORDER — IOHEXOL 300 MG/ML  SOLN
80.0000 mL | Freq: Once | INTRAMUSCULAR | Status: AC | PRN
  Administered 2014-09-04: 80 mL via INTRAVENOUS

## 2014-09-06 ENCOUNTER — Encounter: Payer: Self-pay | Admitting: Oncology

## 2014-09-06 ENCOUNTER — Telehealth: Payer: Self-pay | Admitting: Oncology

## 2014-09-06 ENCOUNTER — Ambulatory Visit (HOSPITAL_BASED_OUTPATIENT_CLINIC_OR_DEPARTMENT_OTHER): Payer: Medicare Other | Admitting: Oncology

## 2014-09-06 VITALS — BP 150/67 | HR 55 | Temp 97.9°F | Resp 17 | Ht 66.0 in | Wt 133.9 lb

## 2014-09-06 DIAGNOSIS — C189 Malignant neoplasm of colon, unspecified: Secondary | ICD-10-CM | POA: Diagnosis not present

## 2014-09-06 DIAGNOSIS — R197 Diarrhea, unspecified: Secondary | ICD-10-CM

## 2014-09-06 DIAGNOSIS — D696 Thrombocytopenia, unspecified: Secondary | ICD-10-CM | POA: Diagnosis not present

## 2014-09-06 DIAGNOSIS — C649 Malignant neoplasm of unspecified kidney, except renal pelvis: Secondary | ICD-10-CM | POA: Diagnosis not present

## 2014-09-06 DIAGNOSIS — R634 Abnormal weight loss: Secondary | ICD-10-CM

## 2014-09-06 NOTE — Telephone Encounter (Signed)
Pt confirmed labs/ov per 09/17 POF, gave pt AVS...KJ °

## 2014-09-06 NOTE — Telephone Encounter (Signed)
Pt confirmed md visit per 09/17 POF, gave pt AVS..Marland KitchenKJ

## 2014-09-06 NOTE — Progress Notes (Signed)
Hematology and Oncology Follow Up Visit  Hunter Hardin [id number] 05-28-1935 79 y.o. 09/06/2014 12:57 PM   Principle Diagnosis: 79 year old gentleman with:  1. T3 N0 colon cancer: He is status post a laparoscopic right hemicolectomy, with 0 out of 21 lymph nodes involved. That was done on 03/06/2013. 2. Renal cell carcinoma diagnosed in May of 2014. He presented with kidney mass and lung metastasis. 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  Current therapy: Started Votrient 800 mg daily on 05/22/2013. Dose was reduced to 600 mg daily on 01/19/14 due to weight loss and diarrhea. This will be reduced to 400 mg starting on 09/07/2014.   Interim History: Hunter Hardin presents today for a follow up visit.Since the last visit, he reports continued diarrhea despite being on Imodium. His appetite seems to have decline and have lost 4 lbs. He is tolerating the current dose of Votrient any other complications He denies chest pain, shortness of breath, dyspnea on exertion. He denies abdominal pain. He does not report any hematochezia. Performance status and activity level are actually improving and getting close to baseline. He has not reported any hospitalization or illnesses.He has not reported any pain in his hands or feet. Has not reported any fevers or chills or sweats. He does not report any changes in his multiple status, headaches, blurred vision or seizures. He does not report any frequency urgency or hesitancy. Does not report any hematuria or dysuria. He does not report any pain especially in any bony structures. He denies any back pain or hip pain. He is not report any lymphadenopathy or petechiae but developed a skin lesion on his face and following with dermatology at this time. Rest of his review of systems unremarkable.   Medications: I have reviewed the patient's current medications. Reviewed today and  unchanged. Current Outpatient Prescriptions  Medication Sig Dispense Refill  . diphenoxylate-atropine (LOMOTIL) 2.5-0.025 MG per tablet Take 1 tablet by mouth 4 (four) times daily as needed for diarrhea or loose stools.  30 tablet  0  . doxazosin (CARDURA) 8 MG tablet TAKE 1 TABLET DAILY  90 tablet  1  . fish oil-omega-3 fatty acids 1000 MG capsule Take 1 g by mouth 2 (two) times daily.      . hydrochlorothiazide (HYDRODIURIL) 25 MG tablet TAKE 1 TABLET DAILY  90 tablet  3  . lactose free nutrition (BOOST) LIQD Take 237 mLs by mouth daily.      [name] levothyroxine (SYNTHROID, LEVOTHROID) 50 MCG tablet 2 tabs on Sundays and Wednesdays, 1 tab on other days.  9 tabs per week total.      . ondansetron (ZOFRAN) 8 MG tablet Take 1 tablet (8 mg total) by mouth every 8 (eight) hours as needed for nausea.  20 tablet  2  . pazopanib (VOTRIENT) 200 MG tablet TAKE 3 TABLETS (600 MG) DAILY  90 tablet  0  . potassium chloride SA (K-DUR,KLOR-CON) 20 MEQ tablet Take 1 tablet (20 mEq total) by mouth 2 (two) times daily.  60 tablet  2  . propranolol (INDERAL) 40 MG tablet Take 0.5 tablets (20 mg total) by mouth 2 (two) times daily.      . verapamil (CALAN-SR) 240 MG CR tablet TAKE 1 TABLET DAILY  90 tablet  1  . vitamin C (ASCORBIC ACID) 500 MG tablet Take 500 mg by mouth daily.       No current facility-administered medications for  this visit.    Allergies: No Known Allergies  Past Medical History, Surgical history, Social history, and Family History were reviewed and updated.    Physical Exam: Blood pressure 150/67, pulse 55, temperature 97.9 F (36.6 C), temperature source Oral, resp. rate 17, height 5\' 6"  (1.676 m), weight 133 lb 14.4 oz (60.737 kg), SpO2 97.00%. ECOG: 1 General appearance: alert awake not in any distress Head: Normocephalic, without obvious abnormality Neck: no adenopathy, no masses or lesions. Lymph nodes: Cervical, supraclavicular, and axillary nodes normal. Heart:regular rate and  rhythm, S1, S2. No murmurs rubs or gallops. Lung:chest clear, no wheezing, rales, no dullness to percussion. Abdomen: soft, non-tender, without masses or organomegaly no shifting dullness or ascites. EXT:no erythema, induration, or nodules Skin: No rashes or lesions noted today.   Lab Results: Lab Results  Component Value Date   WBC 8.2 09/04/2014   HGB 13.0 09/04/2014   HCT 38.8 09/04/2014   MCV 104.6* 09/04/2014   PLT 117* 09/04/2014    EXAM:  CT CHEST, ABDOMEN, AND PELVIS WITH CONTRAST  TECHNIQUE:  Multidetector CT imaging of the chest, abdomen and pelvis was  performed following the standard protocol during bolus  administration of intravenous contrast.  CONTRAST: 74mL OMNIPAQUE IOHEXOL 300 MG/ML SOLN  COMPARISON: Prior examinations 05/16/2014 and 01/16/2014.  FINDINGS:  CT CHEST FINDINGS  Mediastinum: There has been further improvement in the  lymphadenopathy. A right hilar node measures 1.2 cm on image 27  (previously 1.6 cm). There is an 8 mm left hilar node on image 25  (previously 9 mm). 10 mm subcarinal node on image 29 has not  significantly changed. A distal paraesophageal node measuring 7 mm  on image 38 is slightly smaller. The thyroid gland, trachea and  esophagus appear normal. The heart size is normal. There is stable  atherosclerosis of the aorta, great vessels and coronary arteries.  Lungs/Pleura: Trace bilateral pleural effusions are similar to the  prior study. There is no pericardial effusion.The previously  demonstrated cavitary left lower lobe lesion is similar in size,  measuring 2.8 x 2.1 cm on image 39, but demonstrates increase  cavitation. Additional nodules measuring 6 mm in the left upper lobe  on image 12 and 5 mm in the left lower lobe on image 20 are stable.  There is stable peripheral fibrotic changes in both lungs.  Musculoskeletal/Chest wall: No chest wall lesion or suspicious  osseous finding demonstrated.  CT ABDOMEN AND PELVIS FINDINGS   Liver/Biliary/Pancreas: The liver is normal in density without focal  abnormality. Mild periportal edema appear stable. There is no  significant biliary dilatation status post cholecystectomy. There is  stable low-density within the uncinate process of the pancreas  (image 65). There is no pancreatic ductal dilatation mild soft  tissue stranding around the pancreatic head and duodenum is  unchanged.  Spleen/Adrenal glands: Unremarkable.  Kidneys/Ureters/Bladder: The complex cystic mass involving the upper  pole the left kidney has not significantly changed, measuring 4.3 x  3.5 cm on image 60. There is persistent nodular enhancement within  the wall of this cystic lesion which appears slightly progressive,  especially inferiorly. The small simple cysts involving the  interpolar region of the right kidney is stable. There is no  hydronephrosis or evidence of urinary tract calculus. The bladder  appears normal.  Bowel/Peritoneum: There are stable postsurgical changes status post  right hemicolectomy. Stable colon diverticular changes and mild wall  thickening of the rectum all are noted. The stomach and small bowel  demonstrate  no significant findings. There is a small amount of  pelvic ascites without demonstrated nodularity.  Retroperitoneum/Pelvis: There are no enlarged abdominal or pelvic  lymph nodes. There is stable diffuse atherosclerosis of the aorta,  its branches and iliac arteries. Moderate enlargement of the  prostate gland is unchanged.  Abdominal wall: No abdominal wall masses or hernias.  Musculoskeletal: There is stable sclerotic lesions within the right  iliac bone. No lytic lesion or pathologic fracture demonstrated.  IMPRESSION:  1. Further slight improvement in mediastinal and hilar  lymphadenopathy.  2. The dominant cavitary left lower lobe lesion appears less solid  and more cavitary. Its size is unchanged. Additional smaller left  lung nodules are stable. Based  on older prior studies, these are  consistent with treated metastases.  3. Interval increased nodular enhancement within the walls of the  cavitary left renal cell carcinoma.  4. No definite abdominal metastases. Low-density within the uncinate  process of the pancreas is similar to prior studies and may be  related to pancreatitis as previously suggested. Minimal new ascites  without peritoneal nodularity.     A 79 year old gentleman with the following issues.   1. Colon cancer: He is status post a laparoscopic right hemicolectomy, with the pathology revealing a T3 N0, with 0 out of 21 lymph nodes involved. He did not receive adjuvant chemotherapy for colon cancer given that he has stage IV kidney cancer.   2. Kidney cancer: he has stage IV disease with lung involvment. He is currently on Votrient dose reduced to 600 mg daily. CT scan from 09/04/2014 was discussed today and continues to showe good response to this medication. The primary tumor appeared to be necrotic at the center without any new lesions detected. He continues to have weight loss and diarrhea despite the dose reduction to 600 mg. I have recommended to reduce the dose to 400 mg moving forward.  3. Weight loss: Weight has stabilized for a while but recently declining. I am hoping the dose reduction would help with weight improvement.  4. Diarrhea. Due to prior hemicolectomy versus Votrient. Continue Imodium as needed. Hoping this will improve with dose reduction.  5. Thrombocytopenia. Due to Votrient appears to have stabilized after the dose reduction.  6. Followup: Will be in 4 to 5 weeks.       Martesha Niedermeier 9/17/201512:57 PM

## 2014-09-13 ENCOUNTER — Other Ambulatory Visit: Payer: Self-pay | Admitting: *Deleted

## 2014-09-13 MED ORDER — POTASSIUM CHLORIDE CRYS ER 20 MEQ PO TBCR: 20.0000 meq | EXTENDED_RELEASE_TABLET | Freq: Two times a day (BID) | ORAL | Status: DC

## 2014-09-19 ENCOUNTER — Other Ambulatory Visit: Payer: Self-pay | Admitting: Oncology

## 2014-10-04 DIAGNOSIS — H524 Presbyopia: Secondary | ICD-10-CM | POA: Diagnosis not present

## 2014-10-04 DIAGNOSIS — H52223 Regular astigmatism, bilateral: Secondary | ICD-10-CM | POA: Diagnosis not present

## 2014-10-04 DIAGNOSIS — H5203 Hypermetropia, bilateral: Secondary | ICD-10-CM | POA: Diagnosis not present

## 2014-10-04 DIAGNOSIS — H3530 Unspecified macular degeneration: Secondary | ICD-10-CM | POA: Diagnosis not present

## 2014-10-12 ENCOUNTER — Ambulatory Visit (HOSPITAL_BASED_OUTPATIENT_CLINIC_OR_DEPARTMENT_OTHER): Payer: Medicare Other | Admitting: Physician Assistant

## 2014-10-12 ENCOUNTER — Other Ambulatory Visit (HOSPITAL_BASED_OUTPATIENT_CLINIC_OR_DEPARTMENT_OTHER): Payer: Medicare Other

## 2014-10-12 ENCOUNTER — Telehealth: Payer: Self-pay | Admitting: Oncology

## 2014-10-12 ENCOUNTER — Encounter: Payer: Self-pay | Admitting: Physician Assistant

## 2014-10-12 VITALS — BP 130/65 | HR 53 | Temp 97.7°F | Resp 18 | Ht 66.0 in | Wt 138.5 lb

## 2014-10-12 DIAGNOSIS — D6959 Other secondary thrombocytopenia: Secondary | ICD-10-CM

## 2014-10-12 DIAGNOSIS — C189 Malignant neoplasm of colon, unspecified: Secondary | ICD-10-CM | POA: Diagnosis not present

## 2014-10-12 DIAGNOSIS — R197 Diarrhea, unspecified: Secondary | ICD-10-CM

## 2014-10-12 DIAGNOSIS — C649 Malignant neoplasm of unspecified kidney, except renal pelvis: Secondary | ICD-10-CM | POA: Diagnosis not present

## 2014-10-12 DIAGNOSIS — C642 Malignant neoplasm of left kidney, except renal pelvis: Secondary | ICD-10-CM

## 2014-10-12 DIAGNOSIS — C7802 Secondary malignant neoplasm of left lung: Secondary | ICD-10-CM | POA: Diagnosis not present

## 2014-10-12 LAB — COMPREHENSIVE METABOLIC PANEL (CC13)
ALBUMIN: 2.7 g/dL — AB (ref 3.5–5.0)
ALT: 28 U/L (ref 0–55)
ANION GAP: 7 meq/L (ref 3–11)
AST: 24 U/L (ref 5–34)
Alkaline Phosphatase: 53 U/L (ref 40–150)
BUN: 17.5 mg/dL (ref 7.0–26.0)
CALCIUM: 8.4 mg/dL (ref 8.4–10.4)
CHLORIDE: 111 meq/L — AB (ref 98–109)
CO2: 26 mEq/L (ref 22–29)
Creatinine: 0.9 mg/dL (ref 0.7–1.3)
Glucose: 79 mg/dl (ref 70–140)
POTASSIUM: 3.6 meq/L (ref 3.5–5.1)
Sodium: 144 mEq/L (ref 136–145)
Total Bilirubin: 0.61 mg/dL (ref 0.20–1.20)
Total Protein: 5.8 g/dL — ABNORMAL LOW (ref 6.4–8.3)

## 2014-10-12 LAB — CBC WITH DIFFERENTIAL/PLATELET
BASO%: 0.2 % (ref 0.0–2.0)
Basophils Absolute: 0 10*3/uL (ref 0.0–0.1)
EOS%: 3.4 % (ref 0.0–7.0)
Eosinophils Absolute: 0.2 10*3/uL (ref 0.0–0.5)
HCT: 34.8 % — ABNORMAL LOW (ref 38.4–49.9)
HGB: 11.5 g/dL — ABNORMAL LOW (ref 13.0–17.1)
LYMPH#: 2.2 10*3/uL (ref 0.9–3.3)
LYMPH%: 41.8 % (ref 14.0–49.0)
MCH: 34.9 pg — AB (ref 27.2–33.4)
MCHC: 32.9 g/dL (ref 32.0–36.0)
MCV: 106 fL — AB (ref 79.3–98.0)
MONO#: 0.4 10*3/uL (ref 0.1–0.9)
MONO%: 6.9 % (ref 0.0–14.0)
NEUT%: 47.7 % (ref 39.0–75.0)
NEUTROS ABS: 2.5 10*3/uL (ref 1.5–6.5)
Platelets: 128 10*3/uL — ABNORMAL LOW (ref 140–400)
RBC: 3.28 10*6/uL — AB (ref 4.20–5.82)
RDW: 14.2 % (ref 11.0–14.6)
WBC: 5.3 10*3/uL (ref 4.0–10.3)

## 2014-10-12 NOTE — Progress Notes (Signed)
Hematology and Oncology Follow Up Visit  Hunter Hardin [id number] 1935-07-29 79 y.o. 10/12/2014 9:28 AM   Principle Diagnosis: 79 year old gentleman with:  1. T3 N0 colon cancer: He is status post a laparoscopic right hemicolectomy, with 0 out of 21 lymph nodes involved. That was done on 03/06/2013. 2. Renal cell carcinoma diagnosed in May of 2014. He presented with kidney mass and lung metastasis. 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  Current therapy: Started Votrient 800 mg daily on 05/22/2013. Dose was reduced to 600 mg daily on 01/19/14 due to weight loss and diarrhea. This will be reduced to 400 mg starting on 09/07/2014.   Interim History: Hunter Hardin presents today for a follow up visit.Since the last visit, he reports continued diarrhea despite being on Imodium. His appetite has improved and he has gained 5 pounds since his last visit.  He is tolerating the lower dose of Votrient with out complications. He is having fewer episodes of diarrhea. He denies chest pain, shortness of breath, dyspnea on exertion. He denies abdominal pain. He does not report any hematochezia. Performance status and activity level are actually improving and getting closer to baseline. He has not reported any hospitalization or illnesses.He has not reported any pain in his hands or feet. Has not reported any fevers or chills or sweats. He does not report any changes in his multiple status, headaches, blurred vision or seizures. He does not report any frequency urgency or hesitancy. Does not report any hematuria or dysuria. He does not report any pain especially in any skeletal structures. He denies any back pain or hip pain. He is not report any lymphadenopathy or petechiae but developed a skin lesion on his face and following with dermatology at this time. Remainder of his review of systems unremarkable.   Medications: I have reviewed  the patient's current medications. Reviewed today and unchanged. Current Outpatient Prescriptions  Medication Sig Dispense Refill  . diphenoxylate-atropine (LOMOTIL) 2.5-0.025 MG per tablet Take 1 tablet by mouth 4 (four) times daily as needed for diarrhea or loose stools.  30 tablet  0  . doxazosin (CARDURA) 8 MG tablet TAKE 1 TABLET DAILY  90 tablet  1  . fish oil-omega-3 fatty acids 1000 MG capsule Take 1 g by mouth 2 (two) times daily.      . hydrochlorothiazide (HYDRODIURIL) 25 MG tablet TAKE 1 TABLET DAILY  90 tablet  3  . lactose free nutrition (BOOST) LIQD Take 237 mLs by mouth daily.      [name] levothyroxine (SYNTHROID, LEVOTHROID) 50 MCG tablet 2 tabs on Sundays and Wednesdays, 1 tab on other days.  9 tabs per week total.      . ondansetron (ZOFRAN) 8 MG tablet Take 1 tablet (8 mg total) by mouth every 8 (eight) hours as needed for nausea.  20 tablet  2  . potassium chloride SA (K-DUR,KLOR-CON) 20 MEQ tablet Take 1 tablet (20 mEq total) by mouth 2 (two) times daily.  180 tablet  1  . propranolol (INDERAL) 40 MG tablet Take 0.5 tablets (20 mg total) by mouth 2 (two) times daily.      . verapamil (CALAN-SR) 240 MG CR tablet TAKE 1 TABLET DAILY  90 tablet  1  . vitamin C (ASCORBIC ACID) 500 MG tablet Take 500 mg by mouth daily.      [name] VOTRIENT 200 MG tablet TAKE 3 TABLETS DAILY  90 tablet  0   No current facility-administered medications for this visit.    Allergies: No Known Allergies  Past Medical History, Surgical history, Social history, and Family History were reviewed and updated.    Physical Exam: Blood pressure 130/65, pulse 53, temperature 97.7 F (36.5 C), temperature source Oral, resp. rate 18, height 5\' 6"  (1.676 m), weight 138 lb 8 oz (62.823 kg). ECOG: 1 General appearance: alert awake not in any distress Head: Normocephalic, without obvious abnormality Neck: no adenopathy, no masses or lesions. Lymph nodes: Cervical, supraclavicular, and axillary nodes  normal. Heart:regular rate and rhythm, S1, S2. No murmurs rubs or gallops. Lung:chest clear, no wheezing, rales, no dullness to percussion. Abdomen: soft, non-tender, without masses or organomegaly no shifting dullness or ascites. EXT:no erythema, induration, or nodules Skin: No rashes or lesions noted today.   Lab Results: Lab Results  Component Value Date   WBC 5.3 10/12/2014   HGB 11.5* 10/12/2014   HCT 34.8* 10/12/2014   MCV 106.0* 10/12/2014   PLT 128* 10/12/2014    EXAM:  CT CHEST, ABDOMEN, AND PELVIS WITH CONTRAST  TECHNIQUE:  Multidetector CT imaging of the chest, abdomen and pelvis was  performed following the standard protocol during bolus  administration of intravenous contrast.  CONTRAST: 54mL OMNIPAQUE IOHEXOL 300 MG/ML SOLN  COMPARISON: Prior examinations 05/16/2014 and 01/16/2014.  FINDINGS:  CT CHEST FINDINGS  Mediastinum: There has been further improvement in the  lymphadenopathy. A right hilar node measures 1.2 cm on image 27  (previously 1.6 cm). There is an 8 mm left hilar node on image 25  (previously 9 mm). 10 mm subcarinal node on image 29 has not  significantly changed. A distal paraesophageal node measuring 7 mm  on image 38 is slightly smaller. The thyroid gland, trachea and  esophagus appear normal. The heart size is normal. There is stable  atherosclerosis of the aorta, great vessels and coronary arteries.  Lungs/Pleura: Trace bilateral pleural effusions are similar to the  prior study. There is no pericardial effusion.The previously  demonstrated cavitary left lower lobe lesion is similar in size,  measuring 2.8 x 2.1 cm on image 39, but demonstrates increase  cavitation. Additional nodules measuring 6 mm in the left upper lobe  on image 12 and 5 mm in the left lower lobe on image 20 are stable.  There is stable peripheral fibrotic changes in both lungs.  Musculoskeletal/Chest wall: No chest wall lesion or suspicious  osseous finding  demonstrated.  CT ABDOMEN AND PELVIS FINDINGS  Liver/Biliary/Pancreas: The liver is normal in density without focal  abnormality. Mild periportal edema appear stable. There is no  significant biliary dilatation status post cholecystectomy. There is  stable low-density within the uncinate process of the pancreas  (image 65). There is no pancreatic ductal dilatation mild soft  tissue stranding around the pancreatic head and duodenum is  unchanged.  Spleen/Adrenal glands: Unremarkable.  Kidneys/Ureters/Bladder: The complex cystic mass involving the upper  pole the left kidney has not significantly changed, measuring 4.3 x  3.5 cm on image 60. There is persistent nodular enhancement within  the wall of this cystic lesion which appears slightly progressive,  especially inferiorly. The small simple cysts involving the  interpolar region of the right kidney is stable. There is no  hydronephrosis or evidence of urinary tract calculus. The bladder  appears normal.  Bowel/Peritoneum: There are stable postsurgical changes status post  right hemicolectomy. Stable colon diverticular changes and mild wall  thickening of the rectum all are noted. The  stomach and small bowel  demonstrate no significant findings. There is a small amount of  pelvic ascites without demonstrated nodularity.  Retroperitoneum/Pelvis: There are no enlarged abdominal or pelvic  lymph nodes. There is stable diffuse atherosclerosis of the aorta,  its branches and iliac arteries. Moderate enlargement of the  prostate gland is unchanged.  Abdominal wall: No abdominal wall masses or hernias.  Musculoskeletal: There is stable sclerotic lesions within the right  iliac bone. No lytic lesion or pathologic fracture demonstrated.  IMPRESSION:  1. Further slight improvement in mediastinal and hilar  lymphadenopathy.  2. The dominant cavitary left lower lobe lesion appears less solid  and more cavitary. Its size is unchanged.  Additional smaller left  lung nodules are stable. Based on older prior studies, these are  consistent with treated metastases.  3. Interval increased nodular enhancement within the walls of the  cavitary left renal cell carcinoma.  4. No definite abdominal metastases. Low-density within the uncinate  process of the pancreas is similar to prior studies and may be  related to pancreatitis as previously suggested. Minimal new ascites  without peritoneal nodularity.     A 79 year old gentleman with the following issues.   1. Colon cancer: He is status post a laparoscopic right hemicolectomy, with the pathology revealing a T3 N0, with 0 out of 21 lymph nodes involved. He did not receive adjuvant chemotherapy for colon cancer given that he has stage IV kidney cancer.   2. Kidney cancer: he has stage IV disease with lung involvment. He is currently on Votrient dose reduced to 600 mg daily. CT scan from 09/04/2014 was discussed today and continues to showe good response to this medication. The primary tumor appeared to be necrotic at the center without any new lesions detected. He continues to have weight loss and diarrhea despite the dose reduction to 600 mg. Dr. Alen Blew reduced the dose to 400 mg moving forward. He seems to be tolerating this lower dose much better.  3. Weight loss: Weight has increased by five pounds since his las office visit.I suspect the dose reduction in the Votrient has helped.  4. Diarrhea. Due to prior hemicolectomy versus Votrient. Improved. Continue Imodium as needed.   5. Thrombocytopenia. Due to Votrient appears to have stabilized after the dose reduction.  6. Followup: Will be in 4 to 5 weeks.       Keshon Markovitz E 10/23/20159:28 AM

## 2014-10-12 NOTE — Telephone Encounter (Signed)
gv adn printed appt sched and avs for pt for NOV

## 2014-10-14 NOTE — Patient Instructions (Signed)
Continue Votrient 400 mg by mouth daily Followup in 4-5 weeks

## 2014-11-05 ENCOUNTER — Other Ambulatory Visit: Payer: Self-pay | Admitting: Oncology

## 2014-11-06 ENCOUNTER — Other Ambulatory Visit: Payer: Self-pay | Admitting: Family Medicine

## 2014-11-16 ENCOUNTER — Telehealth: Payer: Self-pay | Admitting: Oncology

## 2014-11-16 ENCOUNTER — Ambulatory Visit (HOSPITAL_BASED_OUTPATIENT_CLINIC_OR_DEPARTMENT_OTHER): Payer: Medicare Other | Admitting: Oncology

## 2014-11-16 ENCOUNTER — Other Ambulatory Visit (HOSPITAL_BASED_OUTPATIENT_CLINIC_OR_DEPARTMENT_OTHER): Payer: Medicare Other

## 2014-11-16 VITALS — BP 125/56 | HR 52 | Temp 97.4°F | Resp 18 | Ht 66.0 in | Wt 134.3 lb

## 2014-11-16 DIAGNOSIS — C649 Malignant neoplasm of unspecified kidney, except renal pelvis: Secondary | ICD-10-CM

## 2014-11-16 DIAGNOSIS — C189 Malignant neoplasm of colon, unspecified: Secondary | ICD-10-CM

## 2014-11-16 DIAGNOSIS — D696 Thrombocytopenia, unspecified: Secondary | ICD-10-CM | POA: Diagnosis not present

## 2014-11-16 DIAGNOSIS — R197 Diarrhea, unspecified: Secondary | ICD-10-CM | POA: Diagnosis not present

## 2014-11-16 DIAGNOSIS — R634 Abnormal weight loss: Secondary | ICD-10-CM

## 2014-11-16 DIAGNOSIS — C7800 Secondary malignant neoplasm of unspecified lung: Secondary | ICD-10-CM | POA: Diagnosis not present

## 2014-11-16 DIAGNOSIS — C642 Malignant neoplasm of left kidney, except renal pelvis: Secondary | ICD-10-CM

## 2014-11-16 LAB — COMPREHENSIVE METABOLIC PANEL (CC13)
ALT: 21 U/L (ref 0–55)
AST: 21 U/L (ref 5–34)
Albumin: 3 g/dL — ABNORMAL LOW (ref 3.5–5.0)
Alkaline Phosphatase: 59 U/L (ref 40–150)
Anion Gap: 9 mEq/L (ref 3–11)
BUN: 19.8 mg/dL (ref 7.0–26.0)
CO2: 24 meq/L (ref 22–29)
Calcium: 8.6 mg/dL (ref 8.4–10.4)
Chloride: 108 mEq/L (ref 98–109)
Creatinine: 1 mg/dL (ref 0.7–1.3)
Glucose: 96 mg/dl (ref 70–140)
Potassium: 3.9 mEq/L (ref 3.5–5.1)
SODIUM: 141 meq/L (ref 136–145)
TOTAL PROTEIN: 6.3 g/dL — AB (ref 6.4–8.3)
Total Bilirubin: 0.66 mg/dL (ref 0.20–1.20)

## 2014-11-16 LAB — CBC WITH DIFFERENTIAL/PLATELET
BASO%: 0 % (ref 0.0–2.0)
Basophils Absolute: 0 10*3/uL (ref 0.0–0.1)
EOS ABS: 0.2 10*3/uL (ref 0.0–0.5)
EOS%: 3.2 % (ref 0.0–7.0)
HCT: 37.5 % — ABNORMAL LOW (ref 38.4–49.9)
HGB: 12.3 g/dL — ABNORMAL LOW (ref 13.0–17.1)
LYMPH#: 3 10*3/uL (ref 0.9–3.3)
LYMPH%: 46.2 % (ref 14.0–49.0)
MCH: 34.6 pg — ABNORMAL HIGH (ref 27.2–33.4)
MCHC: 32.8 g/dL (ref 32.0–36.0)
MCV: 105.6 fL — ABNORMAL HIGH (ref 79.3–98.0)
MONO#: 0.4 10*3/uL (ref 0.1–0.9)
MONO%: 6.4 % (ref 0.0–14.0)
NEUT%: 44.2 % (ref 39.0–75.0)
NEUTROS ABS: 2.9 10*3/uL (ref 1.5–6.5)
Platelets: 121 10*3/uL — ABNORMAL LOW (ref 140–400)
RBC: 3.55 10*6/uL — ABNORMAL LOW (ref 4.20–5.82)
RDW: 14.1 % (ref 11.0–14.6)
WBC: 6.5 10*3/uL (ref 4.0–10.3)

## 2014-11-16 NOTE — Telephone Encounter (Signed)
gv and printed appt sched and avs for pt for DEC °

## 2014-11-16 NOTE — Progress Notes (Signed)
Hematology and Oncology Follow Up Visit  Hunter Hardin [id number] 11-Jun-1935 79 y.o. 11/16/2014 8:46 AM   Principle Diagnosis: 80 year old gentleman with:  1. T3 N0 colon cancer: He is status post a laparoscopic right hemicolectomy, with 0 out of 21 lymph nodes involved. That was done on 03/06/2013. 2. Renal cell carcinoma diagnosed in May of 2014. He presented with kidney mass and lung metastasis. 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  Current therapy: Started Votrient 800 mg daily on 05/22/2013. Dose was reduced to 600 mg daily on 01/19/14 due to weight loss and diarrhea. This will be reduced to 400 mg starting on 09/07/2014. The dose will be reduced further to 200 mg daily starting on 11/16/2014. The dose reduction is related to diarrhea and weight loss.  Interim History: Hunter Hardin presents today for a follow up visit.Since the last visit, he reports continued diarrhea and weight loss despite the dose reduction of his medication. His appetite improved transiently but in the last month declined and lost 4 pounds. He is having at least 3-4 episodes of diarrhea. He denies chest pain, shortness of breath, dyspnea on exertion. He denies abdominal pain. He does not report any hematochezia. Performance status and activity level are getting closer to baseline. He has not reported any hospitalization or illnesses.He has not reported any pain in his hands or feet. Has not reported any fevers or chills or sweats. He does not report any changes in his multiple status, headaches, blurred vision or seizures. He does not report any frequency urgency or hesitancy. Does not report any hematuria or dysuria. He does not report any pain especially in any skeletal structures. He denies any back pain or hip pain. He is not report any lymphadenopathy or petechiae but developed a skin lesion on his face and following with dermatology at this  time. Remainder of his review of systems unremarkable.   Medications: I have reviewed the patient's current medications. Reviewed today and unchanged. Current Outpatient Prescriptions  Medication Sig Dispense Refill  . diphenoxylate-atropine (LOMOTIL) 2.5-0.025 MG per tablet Take 1 tablet by mouth 4 (four) times daily as needed for diarrhea or loose stools. 30 tablet 0  . doxazosin (CARDURA) 8 MG tablet TAKE 1 TABLET DAILY 90 tablet 1  . fish oil-omega-3 fatty acids 1000 MG capsule Take 1 g by mouth 2 (two) times daily.    . hydrochlorothiazide (HYDRODIURIL) 25 MG tablet TAKE 1 TABLET DAILY 90 tablet 3  . lactose free nutrition (BOOST) LIQD Take 237 mLs by mouth daily.    [name] levothyroxine (SYNTHROID, LEVOTHROID) 50 MCG tablet 2 tabs on Sundays and Wednesdays, 1 tab on other days.  9 tabs per week total.    . ondansetron (ZOFRAN) 8 MG tablet Take 1 tablet (8 mg total) by mouth every 8 (eight) hours as needed for nausea. 20 tablet 2  . potassium chloride SA (K-DUR,KLOR-CON) 20 MEQ tablet Take 1 tablet (20 mEq total) by mouth 2 (two) times daily. 180 tablet 1  . propranolol (INDERAL) 40 MG tablet Take 0.5 tablets (20 mg total) by mouth 2 (two) times daily.    . verapamil (CALAN-SR) 240 MG CR tablet TAKE 1 TABLET DAILY 90 tablet 0  . vitamin C (ASCORBIC ACID) 500 MG tablet Take 500 mg by mouth daily.    [name] VOTRIENT 200 MG tablet TAKE 3 TABLETS DAILY 90 tablet 0   No current facility-administered medications for this  visit.    Allergies: No Known Allergies  Past Medical History, Surgical history, Social history, and Family History were reviewed and updated.    Physical Exam: Blood pressure 125/56, pulse 52, temperature 97.4 F (36.3 C), temperature source Oral, resp. rate 18, height 5\' 6"  (1.676 m), weight 134 lb 4.8 oz (60.918 kg). ECOG: 1 General appearance: alert awake not in any distress Head: Normocephalic, without obvious abnormality Neck: no adenopathy, no masses or lesions. Lymph  nodes: Cervical, supraclavicular, and axillary nodes normal. Heart:regular rate and rhythm, S1, S2. No murmurs rubs or gallops. Lung:chest clear, no wheezing, rales, no dullness to percussion. Abdomen: soft, non-tender, without masses or organomegaly no shifting dullness or ascites. EXT:no erythema, induration, or nodules Skin: No rashes or lesions noted today.   Lab Results: Lab Results  Component Value Date   WBC 6.5 11/16/2014   HGB 12.3* 11/16/2014   HCT 37.5* 11/16/2014   MCV 105.6* 11/16/2014   PLT 121* 11/16/2014      A 79 year old gentleman with the following issues.   1. Colon cancer: He is status post a laparoscopic right hemicolectomy, with the pathology revealing a T3 N0, with 0 out of 21 lymph nodes involved. He did not receive adjuvant chemotherapy for colon cancer given that he has stage IV kidney cancer.   2. Kidney cancer: he has stage IV disease with lung involvment. He is currently on Votrient dose reduced to 400 mg daily. CT scan from 09/04/2014 showed good response to Votrient. The primary tumor appeared to be necrotic at the center without any new lesions detected. Despite the dose reduction, he continues to lose weight and have diarrhea. I have instructed him to reduce the dose to 200 mg daily moving forward. He had an excellent response to this medication even at a reduced dose.  3. Weight loss: Weight has declined 4 pounds since the last visit. I instructed him to increase his nutritional supplements to 3 times a day and I'm hoping the dose reduction of Votrient will help.  4. Diarrhea. Due to prior hemicolectomy versus Votrient. Improved. Continue Imodium as needed.   5. Thrombocytopenia. Due to Votrient appears to have stabilized after the dose reduction.  6. Followup: Will be in 4 to 5 weeks.       Hunter Hardin 11/27/20158:46 AM

## 2014-12-01 ENCOUNTER — Other Ambulatory Visit: Payer: Self-pay | Admitting: Family Medicine

## 2014-12-10 DIAGNOSIS — L814 Other melanin hyperpigmentation: Secondary | ICD-10-CM | POA: Diagnosis not present

## 2014-12-10 DIAGNOSIS — L578 Other skin changes due to chronic exposure to nonionizing radiation: Secondary | ICD-10-CM | POA: Diagnosis not present

## 2014-12-10 DIAGNOSIS — L570 Actinic keratosis: Secondary | ICD-10-CM | POA: Diagnosis not present

## 2014-12-10 DIAGNOSIS — Z1283 Encounter for screening for malignant neoplasm of skin: Secondary | ICD-10-CM | POA: Diagnosis not present

## 2014-12-10 DIAGNOSIS — D692 Other nonthrombocytopenic purpura: Secondary | ICD-10-CM | POA: Diagnosis not present

## 2014-12-17 ENCOUNTER — Ambulatory Visit (HOSPITAL_BASED_OUTPATIENT_CLINIC_OR_DEPARTMENT_OTHER): Payer: Medicare Other | Admitting: Physician Assistant

## 2014-12-17 ENCOUNTER — Encounter: Payer: Self-pay | Admitting: Physician Assistant

## 2014-12-17 ENCOUNTER — Other Ambulatory Visit (HOSPITAL_BASED_OUTPATIENT_CLINIC_OR_DEPARTMENT_OTHER): Payer: Medicare Other

## 2014-12-17 ENCOUNTER — Telehealth: Payer: Self-pay | Admitting: Oncology

## 2014-12-17 VITALS — BP 139/54 | HR 53 | Temp 97.9°F | Resp 18 | Ht 66.0 in | Wt 140.9 lb

## 2014-12-17 DIAGNOSIS — C649 Malignant neoplasm of unspecified kidney, except renal pelvis: Secondary | ICD-10-CM

## 2014-12-17 DIAGNOSIS — R634 Abnormal weight loss: Secondary | ICD-10-CM | POA: Diagnosis not present

## 2014-12-17 DIAGNOSIS — C189 Malignant neoplasm of colon, unspecified: Secondary | ICD-10-CM

## 2014-12-17 DIAGNOSIS — R197 Diarrhea, unspecified: Secondary | ICD-10-CM | POA: Diagnosis not present

## 2014-12-17 DIAGNOSIS — R609 Edema, unspecified: Secondary | ICD-10-CM

## 2014-12-17 DIAGNOSIS — C642 Malignant neoplasm of left kidney, except renal pelvis: Secondary | ICD-10-CM

## 2014-12-17 DIAGNOSIS — C7802 Secondary malignant neoplasm of left lung: Secondary | ICD-10-CM | POA: Diagnosis not present

## 2014-12-17 LAB — COMPREHENSIVE METABOLIC PANEL (CC13)
ALBUMIN: 2.9 g/dL — AB (ref 3.5–5.0)
ALT: 19 U/L (ref 0–55)
ANION GAP: 7 meq/L (ref 3–11)
AST: 17 U/L (ref 5–34)
Alkaline Phosphatase: 56 U/L (ref 40–150)
BUN: 17.7 mg/dL (ref 7.0–26.0)
CHLORIDE: 109 meq/L (ref 98–109)
CO2: 25 meq/L (ref 22–29)
Calcium: 8.6 mg/dL (ref 8.4–10.4)
Creatinine: 0.9 mg/dL (ref 0.7–1.3)
EGFR: 82 mL/min/{1.73_m2} — ABNORMAL LOW (ref >90)
Glucose: 113 mg/dl (ref 70–140)
POTASSIUM: 4.2 meq/L (ref 3.5–5.1)
SODIUM: 142 meq/L (ref 136–145)
TOTAL PROTEIN: 6 g/dL — AB (ref 6.4–8.3)
Total Bilirubin: 0.49 mg/dL (ref 0.20–1.20)

## 2014-12-17 LAB — CBC WITH DIFFERENTIAL/PLATELET
BASO%: 0.1 % (ref 0.0–2.0)
Basophils Absolute: 0 10*3/uL (ref 0.0–0.1)
EOS ABS: 0.3 10*3/uL (ref 0.0–0.5)
EOS%: 4.1 % (ref 0.0–7.0)
HEMATOCRIT: 36.1 % — AB (ref 38.4–49.9)
HGB: 11.6 g/dL — ABNORMAL LOW (ref 13.0–17.1)
LYMPH#: 2.5 10*3/uL (ref 0.9–3.3)
LYMPH%: 37 % (ref 14.0–49.0)
MCH: 34.3 pg — ABNORMAL HIGH (ref 27.2–33.4)
MCHC: 32.1 g/dL (ref 32.0–36.0)
MCV: 106.6 fL — ABNORMAL HIGH (ref 79.3–98.0)
MONO#: 0.5 10*3/uL (ref 0.1–0.9)
MONO%: 7.5 % (ref 0.0–14.0)
NEUT%: 51.3 % (ref 39.0–75.0)
NEUTROS ABS: 3.5 10*3/uL (ref 1.5–6.5)
PLATELETS: 146 10*3/uL (ref 140–400)
RBC: 3.39 10*6/uL — ABNORMAL LOW (ref 4.20–5.82)
RDW: 13.9 % (ref 11.0–14.6)
WBC: 6.8 10*3/uL (ref 4.0–10.3)

## 2014-12-17 NOTE — Patient Instructions (Signed)
Continue Votrient 200 mg by mouth daily Follow-up in one month

## 2014-12-17 NOTE — Telephone Encounter (Signed)
, °

## 2014-12-17 NOTE — Progress Notes (Signed)
Hematology and Oncology Follow Up Visit  Hunter Hardin [id number] Mar 18, 1935 78 y.o. 12/17/2014 9:03 AM   Principle Diagnosis: 79 year old gentleman with:  1. T3 N0 colon cancer: He is status post a laparoscopic right hemicolectomy, with 0 out of 21 lymph nodes involved. That was done on 03/06/2013. 2. Renal cell carcinoma diagnosed in May of 2014. He presented with kidney mass and lung metastasis. 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  Current therapy: Started Votrient 800 mg daily on 05/22/2013. Dose was reduced to 600 mg daily on 01/19/14 due to weight loss and diarrhea. This will be reduced to 400 mg starting on 09/07/2014. The dose will be reduced further to 200 mg daily starting on 11/16/2014. The dose reduction is related to diarrhea and weight loss.  Interim History: Hunter Hardin presents today for a follow up visit accompanied by family member.Since the last visit, he reports some improvement in his diarrhea although it is still present. He is having fewer episodes and states it is more episodic in frequency. He is eating better overall and started adding some protein powder to treat smoothies in the mornings. He has gained 6 pounds since his last office visit. He is tolerating the dose reduction of Votrient to 200 mg by mouth daily very well. He notes some lower extremity swelling that has been present for at least the past month or so. This is not painful.  He denies chest pain, shortness of breath, dyspnea on exertion. He denies abdominal pain. He does not report any hematochezia. Performance status and activity level are getting closer to baseline. He has not reported any hospitalization or illnesses.He has not reported any pain in his hands or feet. Has not reported any fevers or chills or sweats. He does not report any changes in his multiple status, headaches, blurred vision or seizures. He does not report  any frequency urgency or hesitancy. Does not report any hematuria or dysuria. He does not report any pain especially in any skeletal structures. He denies any back pain or hip pain. He is not report any lymphadenopathy or petechiae.  Remainder of his review of systems unremarkable.   Medications: I have reviewed the patient's current medications. Reviewed today and unchanged. Current Outpatient Prescriptions  Medication Sig Dispense Refill  . diphenoxylate-atropine (LOMOTIL) 2.5-0.025 MG per tablet Take 1 tablet by mouth 4 (four) times daily as needed for diarrhea or loose stools. 30 tablet 0  . doxazosin (CARDURA) 8 MG tablet TAKE 1 TABLET DAILY 90 tablet 1  . fish oil-omega-3 fatty acids 1000 MG capsule Take 1 g by mouth 2 (two) times daily.    . hydrochlorothiazide (HYDRODIURIL) 25 MG tablet TAKE 1 TABLET DAILY 90 tablet 3  . lactose free nutrition (BOOST) LIQD Take 237 mLs by mouth daily.    [name] levothyroxine (SYNTHROID, LEVOTHROID) 50 MCG tablet TAKE 2 TABLETS ON SUNDAY AND TAKE 1 TABLET ON OTHER DAYS (TAKE 8 PER WEEK TOTAL) 105 tablet 2  . ondansetron (ZOFRAN) 8 MG tablet Take 1 tablet (8 mg total) by mouth every 8 (eight) hours as needed for nausea. 20 tablet 2  . potassium chloride SA (K-DUR,KLOR-CON) 20 MEQ tablet Take 1 tablet (20 mEq total) by mouth 2 (two) times daily. 180 tablet 1  . propranolol (INDERAL) 40 MG tablet Take 0.5 tablets (20 mg total) by mouth 2 (two) times daily.    . verapamil (CALAN-SR) 240 MG CR tablet  TAKE 1 TABLET DAILY 90 tablet 0  . vitamin C (ASCORBIC ACID) 500 MG tablet Take 500 mg by mouth daily.    [name] VOTRIENT 200 MG tablet TAKE 3 TABLETS DAILY 90 tablet 0   No current facility-administered medications for this visit.    Allergies: No Known Allergies  Past Medical History, Surgical history, Social history, and Family History were reviewed and updated.    Physical Exam: Blood pressure 139/54, pulse 53, temperature 97.9 F (36.6 C), temperature source  Oral, resp. rate 18, height 5\' 6"  (1.676 m), weight 140 lb 14.4 oz (63.912 kg). ECOG: 1 General appearance: alert awake not in any distress Head: Normocephalic, without obvious abnormality Neck: no adenopathy, no masses or lesions. Lymph nodes: Cervical, supraclavicular, and axillary nodes normal. Heart:regular rate and rhythm, S1, S2. No murmurs rubs or gallops. Lung:chest clear, no wheezing, rales, no dullness to percussion. Abdomen: soft, non-tender, without masses or organomegaly no shifting dullness or ascites. EXT:no erythema, induration, or nodules. 1+ pitting edema, bilateral lower extremities, left greater than right Skin: No rashes or lesions noted today.   Lab Results: Lab Results  Component Value Date   WBC 6.8 12/17/2014   HGB 11.6* 12/17/2014   HCT 36.1* 12/17/2014   MCV 106.6* 12/17/2014   PLT 146 12/17/2014      A 79 year old gentleman with the following issues.   1. Colon cancer: He is status post a laparoscopic right hemicolectomy, with the pathology revealing a T3 N0, with 0 out of 21 lymph nodes involved. He did not receive adjuvant chemotherapy for colon cancer given that he has stage IV kidney cancer.   2. Kidney cancer: he has stage IV disease with lung involvment. He is currently on Votrient dose reduced to 400 mg daily. CT scan from 09/04/2014 showed good response to Votrient. The primary tumor appeared to be necrotic at the center without any new lesions detected. Despite the dose reduction, he continues to lose weight and have diarrhea. I have instructed him to reduce the dose to 200 mg daily moving forward. He had an excellent response to this medication even at a reduced dose.  3. Weight loss: Weight has increased by 6 pounds since the last visit. He is tolerating the dose reduction of the Votrient to which he feels has been helpful in terms of his appetite   4. Diarrhea. Due to prior hemicolectomy versus Votrient. Improved. Continue Imodium as needed.    5. Thrombocytopenia. Due to Votrient appears to have stabilized after the dose reduction.  6. Followup: Will be in 4 to 5 weeks.       [name], [name], PA-C 12/28/20159:03 AM

## 2014-12-21 ENCOUNTER — Other Ambulatory Visit: Payer: Self-pay | Admitting: Family Medicine

## 2014-12-26 DIAGNOSIS — N400 Enlarged prostate without lower urinary tract symptoms: Secondary | ICD-10-CM | POA: Diagnosis not present

## 2014-12-26 DIAGNOSIS — C642 Malignant neoplasm of left kidney, except renal pelvis: Secondary | ICD-10-CM | POA: Diagnosis not present

## 2014-12-27 ENCOUNTER — Other Ambulatory Visit: Payer: Self-pay | Admitting: Oncology

## 2015-01-17 ENCOUNTER — Ambulatory Visit (HOSPITAL_BASED_OUTPATIENT_CLINIC_OR_DEPARTMENT_OTHER): Payer: Medicare Other | Admitting: Oncology

## 2015-01-17 ENCOUNTER — Other Ambulatory Visit: Payer: Self-pay | Admitting: Family Medicine

## 2015-01-17 ENCOUNTER — Telehealth: Payer: Self-pay | Admitting: Oncology

## 2015-01-17 ENCOUNTER — Other Ambulatory Visit (HOSPITAL_BASED_OUTPATIENT_CLINIC_OR_DEPARTMENT_OTHER): Payer: Medicare Other

## 2015-01-17 VITALS — BP 144/63 | HR 53 | Temp 97.5°F | Resp 18 | Ht 66.0 in | Wt 143.7 lb

## 2015-01-17 DIAGNOSIS — C642 Malignant neoplasm of left kidney, except renal pelvis: Secondary | ICD-10-CM

## 2015-01-17 DIAGNOSIS — C189 Malignant neoplasm of colon, unspecified: Secondary | ICD-10-CM

## 2015-01-17 DIAGNOSIS — R197 Diarrhea, unspecified: Secondary | ICD-10-CM

## 2015-01-17 DIAGNOSIS — R634 Abnormal weight loss: Secondary | ICD-10-CM | POA: Diagnosis not present

## 2015-01-17 DIAGNOSIS — C649 Malignant neoplasm of unspecified kidney, except renal pelvis: Secondary | ICD-10-CM | POA: Diagnosis not present

## 2015-01-17 DIAGNOSIS — C7802 Secondary malignant neoplasm of left lung: Secondary | ICD-10-CM

## 2015-01-17 DIAGNOSIS — D696 Thrombocytopenia, unspecified: Secondary | ICD-10-CM

## 2015-01-17 LAB — CBC WITH DIFFERENTIAL/PLATELET
BASO%: 0.4 % (ref 0.0–2.0)
Basophils Absolute: 0 10*3/uL (ref 0.0–0.1)
EOS%: 3.7 % (ref 0.0–7.0)
Eosinophils Absolute: 0.3 10*3/uL (ref 0.0–0.5)
HCT: 36.8 % — ABNORMAL LOW (ref 38.4–49.9)
HEMOGLOBIN: 11.9 g/dL — AB (ref 13.0–17.1)
LYMPH%: 36.2 % (ref 14.0–49.0)
MCH: 33.9 pg — AB (ref 27.2–33.4)
MCHC: 32.2 g/dL (ref 32.0–36.0)
MCV: 105.4 fL — ABNORMAL HIGH (ref 79.3–98.0)
MONO#: 0.6 10*3/uL (ref 0.1–0.9)
MONO%: 8.4 % (ref 0.0–14.0)
NEUT#: 3.6 10*3/uL (ref 1.5–6.5)
NEUT%: 51.3 % (ref 39.0–75.0)
Platelets: 153 10*3/uL (ref 140–400)
RBC: 3.49 10*6/uL — ABNORMAL LOW (ref 4.20–5.82)
RDW: 13.7 % (ref 11.0–14.6)
WBC: 6.9 10*3/uL (ref 4.0–10.3)
lymph#: 2.5 10*3/uL (ref 0.9–3.3)

## 2015-01-17 LAB — COMPREHENSIVE METABOLIC PANEL (CC13)
ALBUMIN: 2.9 g/dL — AB (ref 3.5–5.0)
ALT: 14 U/L (ref 0–55)
AST: 20 U/L (ref 5–34)
Alkaline Phosphatase: 55 U/L (ref 40–150)
Anion Gap: 9 mEq/L (ref 3–11)
BILIRUBIN TOTAL: 0.71 mg/dL (ref 0.20–1.20)
BUN: 18.9 mg/dL (ref 7.0–26.0)
CO2: 25 mEq/L (ref 22–29)
Calcium: 8.8 mg/dL (ref 8.4–10.4)
Chloride: 106 mEq/L (ref 98–109)
Creatinine: 1 mg/dL (ref 0.7–1.3)
EGFR: 69 mL/min/{1.73_m2} — ABNORMAL LOW (ref >90)
GLUCOSE: 85 mg/dL (ref 70–140)
Potassium: 4.6 mEq/L (ref 3.5–5.1)
Sodium: 139 mEq/L (ref 136–145)
Total Protein: 6.5 g/dL (ref 6.4–8.3)

## 2015-01-17 NOTE — Progress Notes (Signed)
Hematology and Oncology Follow Up Visit  Hunter Hardin [id number] 01-23-1935 80 y.o. 01/17/2015 9:12 AM   Principle Diagnosis: 79 year old gentleman with:  1. T3 N0 colon cancer: He is status post a laparoscopic right hemicolectomy, with 0 out of 21 lymph nodes involved. That was done on 03/06/2013. 2. Renal cell carcinoma diagnosed in May of 2014. He presented with kidney mass and lung metastasis. 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  Current therapy: Started Votrient 800 mg daily on 05/22/2013. Dose was reduced to 600 mg daily on 01/19/14 due to weight loss and diarrhea. This will be reduced to 400 mg starting on 09/07/2014. The dose will be reduced further to 200 mg daily starting on 11/16/2014. The dose reduction is related to diarrhea and weight loss.  Interim History: Hunter Hardin presents today for a follow up visit.Since the last visit, he continues to report improvement in his diarrhea and his appetite. He is eating better overall and started adding some protein powder to treat smoothies in the mornings. He continues to gain weight his last office visit. He is tolerating the dose reduction of Votrient to 200 mg by mouth daily very well. He feels that taste is a lot better and able to enjoy food better. He notes some lower extremity swelling that has been present for at least the past month or so. This is not painful.  He denies chest pain, shortness of breath, dyspnea on exertion. He denies abdominal pain. He does not report any hematochezia. Performance status and activity level are getting closer to baseline. He has not reported any hospitalization or illnesses.He has not reported any pain in his hands or feet. Has not reported any fevers or chills or sweats. He does not report any changes in his multiple status, headaches, blurred vision or seizures. He does not report any frequency urgency or hesitancy. Does not  report any hematuria or dysuria. He does not report any pain especially in any skeletal structures. He denies any back pain or hip pain. He is not report any lymphadenopathy or petechiae.  Remainder of his review of systems unremarkable.   Medications: I have reviewed the patient's current medications. Reviewed today and unchanged. Current Outpatient Prescriptions  Medication Sig Dispense Refill  . diphenoxylate-atropine (LOMOTIL) 2.5-0.025 MG per tablet Take 1 tablet by mouth 4 (four) times daily as needed for diarrhea or loose stools. 30 tablet 0  . doxazosin (CARDURA) 8 MG tablet TAKE 1 TABLET DAILY 90 tablet 0  . fish oil-omega-3 fatty acids 1000 MG capsule Take 1 g by mouth 2 (two) times daily.    . hydrochlorothiazide (HYDRODIURIL) 25 MG tablet TAKE 1 TABLET DAILY 90 tablet 3  . lactose free nutrition (BOOST) LIQD Take 237 mLs by mouth daily.    [name] levothyroxine (SYNTHROID, LEVOTHROID) 50 MCG tablet TAKE 2 TABLETS ON SUNDAY AND TAKE 1 TABLET ON OTHER DAYS (TAKE 8 PER WEEK TOTAL) 105 tablet 2  . ondansetron (ZOFRAN) 8 MG tablet Take 1 tablet (8 mg total) by mouth every 8 (eight) hours as needed for nausea. 20 tablet 2  . Pazopanib HCl (VOTRIENT PO) Take 200 mg by mouth daily.    . potassium chloride SA (K-DUR,KLOR-CON) 20 MEQ tablet Take 1 tablet (20 mEq total) by mouth 2 (two) times daily. 180 tablet 1  . propranolol (INDERAL) 40 MG tablet Take 0.5 tablets (20 mg total) by mouth 2 (two) times daily.    [name]  verapamil (CALAN-SR) 240 MG CR tablet TAKE 1 TABLET DAILY 90 tablet 0  . vitamin C (ASCORBIC ACID) 500 MG tablet Take 500 mg by mouth daily.     No current facility-administered medications for this visit.    Allergies: No Known Allergies  Past Medical History, Surgical history, Social history, and Family History were reviewed and updated.    Physical Exam: Blood pressure 144/63, pulse 53, temperature 97.5 F (36.4 C), temperature source Oral, resp. rate 18, height 5\' 6"  (1.676 m),  weight 143 lb 11.2 oz (65.182 kg), SpO2 97 %. ECOG: 1 General appearance: alert awake not in any distress Head: Normocephalic, without obvious abnormality Neck: no adenopathy, no masses or lesions. Lymph nodes: Cervical, supraclavicular, and axillary nodes normal. Heart:regular rate and rhythm, S1, S2. No murmurs rubs or gallops. Lung:chest clear, no wheezing, rales, no dullness to percussion. Abdomen: soft, non-tender, without masses or organomegaly no shifting dullness or ascites. EXT:1+ pitting edema, bilateral lower extremities, left greater than right Skin: No rashes or lesions noted today.   Lab Results: Lab Results  Component Value Date   WBC 6.9 01/17/2015   HGB 11.9* 01/17/2015   HCT 36.8* 01/17/2015   MCV 105.4* 01/17/2015   PLT 153 01/17/2015      A 80 year old gentleman with the following issues.   1. Colon cancer: He is status post a laparoscopic right hemicolectomy, with the pathology revealing a T3 N0, with 0 out of 21 lymph nodes involved. He did not receive adjuvant chemotherapy for colon cancer given that he has stage IV kidney cancer.   2. Kidney cancer: he has stage IV disease with lung involvment. He is currently on Votrient dose reduced to 200 mg daily. CT scan from 09/04/2014 showed good response to Votrient. The plan is to continue on the current dose and schedule and repeat imaging studies before the next visit.  3. Weight loss: Weight has increased by 4 pounds since the last visit. He is tolerating the dose reduction of the Votrient to which he feels has been helpful in terms of his appetite. No further recommendation at this time.  4. Diarrhea. Due to prior hemicolectomy versus Votrient. Improved. Continue Imodium as needed.   5. Thrombocytopenia. Due to Votrient appears to have stabilized after the dose reduction.  6. Followup: Will be in 4 to 5 weeks.       [facility], MD 1/28/20169:12 AM

## 2015-01-17 NOTE — Telephone Encounter (Signed)
Gave avs & calendar for February. °

## 2015-02-01 ENCOUNTER — Other Ambulatory Visit: Payer: Self-pay | Admitting: Family Medicine

## 2015-02-01 DIAGNOSIS — C182 Malignant neoplasm of ascending colon: Secondary | ICD-10-CM | POA: Diagnosis not present

## 2015-02-13 DIAGNOSIS — C189 Malignant neoplasm of colon, unspecified: Secondary | ICD-10-CM | POA: Diagnosis not present

## 2015-02-18 ENCOUNTER — Other Ambulatory Visit: Payer: Self-pay | Admitting: Oncology

## 2015-02-18 ENCOUNTER — Ambulatory Visit (HOSPITAL_COMMUNITY)
Admission: RE | Admit: 2015-02-18 | Discharge: 2015-02-18 | Disposition: A | Discharge status: Home / Self Care | Payer: Medicare Other | Source: Ambulatory Visit | Attending: Oncology | Admitting: Oncology

## 2015-02-18 ENCOUNTER — Other Ambulatory Visit (HOSPITAL_BASED_OUTPATIENT_CLINIC_OR_DEPARTMENT_OTHER): Payer: Medicare Other

## 2015-02-18 ENCOUNTER — Encounter (HOSPITAL_COMMUNITY): Payer: Self-pay

## 2015-02-18 DIAGNOSIS — C649 Malignant neoplasm of unspecified kidney, except renal pelvis: Secondary | ICD-10-CM

## 2015-02-18 DIAGNOSIS — I2510 Atherosclerotic heart disease of native coronary artery without angina pectoris: Secondary | ICD-10-CM | POA: Insufficient documentation

## 2015-02-18 DIAGNOSIS — C7800 Secondary malignant neoplasm of unspecified lung: Secondary | ICD-10-CM | POA: Diagnosis not present

## 2015-02-18 DIAGNOSIS — C642 Malignant neoplasm of left kidney, except renal pelvis: Secondary | ICD-10-CM | POA: Diagnosis not present

## 2015-02-18 DIAGNOSIS — N400 Enlarged prostate without lower urinary tract symptoms: Secondary | ICD-10-CM | POA: Diagnosis not present

## 2015-02-18 DIAGNOSIS — R222 Localized swelling, mass and lump, trunk: Secondary | ICD-10-CM | POA: Diagnosis not present

## 2015-02-18 DIAGNOSIS — C189 Malignant neoplasm of colon, unspecified: Secondary | ICD-10-CM

## 2015-02-18 DIAGNOSIS — J8410 Pulmonary fibrosis, unspecified: Secondary | ICD-10-CM | POA: Diagnosis not present

## 2015-02-18 DIAGNOSIS — I517 Cardiomegaly: Secondary | ICD-10-CM | POA: Diagnosis not present

## 2015-02-18 DIAGNOSIS — N289 Disorder of kidney and ureter, unspecified: Secondary | ICD-10-CM | POA: Diagnosis not present

## 2015-02-18 LAB — CBC WITH DIFFERENTIAL/PLATELET
BASO%: 0.3 % (ref 0.0–2.0)
Basophils Absolute: 0 10*3/uL (ref 0.0–0.1)
EOS%: 2.3 % (ref 0.0–7.0)
Eosinophils Absolute: 0.2 10*3/uL (ref 0.0–0.5)
HCT: 38.7 % (ref 38.4–49.9)
HGB: 12.4 g/dL — ABNORMAL LOW (ref 13.0–17.1)
LYMPH%: 36.4 % (ref 14.0–49.0)
MCH: 33.3 pg (ref 27.2–33.4)
MCHC: 32.1 g/dL (ref 32.0–36.0)
MCV: 103.7 fL — AB (ref 79.3–98.0)
MONO#: 0.5 10*3/uL (ref 0.1–0.9)
MONO%: 7.2 % (ref 0.0–14.0)
NEUT#: 3.8 10*3/uL (ref 1.5–6.5)
NEUT%: 53.8 % (ref 39.0–75.0)
Platelets: 151 10*3/uL (ref 140–400)
RBC: 3.73 10*6/uL — AB (ref 4.20–5.82)
RDW: 14.2 % (ref 11.0–14.6)
WBC: 7 10*3/uL (ref 4.0–10.3)
lymph#: 2.6 10*3/uL (ref 0.9–3.3)

## 2015-02-18 LAB — COMPREHENSIVE METABOLIC PANEL (CC13)
ALK PHOS: 55 U/L (ref 40–150)
ALT: 18 U/L (ref 0–55)
AST: 23 U/L (ref 5–34)
Albumin: 3.2 g/dL — ABNORMAL LOW (ref 3.5–5.0)
Anion Gap: 11 mEq/L (ref 3–11)
BUN: 18.8 mg/dL (ref 7.0–26.0)
CALCIUM: 9.4 mg/dL (ref 8.4–10.4)
CHLORIDE: 106 meq/L (ref 98–109)
CO2: 23 mEq/L (ref 22–29)
CREATININE: 0.9 mg/dL (ref 0.7–1.3)
EGFR: 80 mL/min/{1.73_m2} — AB (ref >90)
GLUCOSE: 90 mg/dL (ref 70–140)
POTASSIUM: 4.2 meq/L (ref 3.5–5.1)
Sodium: 140 mEq/L (ref 136–145)
Total Bilirubin: 0.62 mg/dL (ref 0.20–1.20)
Total Protein: 6.7 g/dL (ref 6.4–8.3)

## 2015-02-18 MED ORDER — IOHEXOL 300 MG/ML  SOLN
100.0000 mL | Freq: Once | INTRAMUSCULAR | Status: AC | PRN
  Administered 2015-02-18: 100 mL via INTRAVENOUS

## 2015-02-20 ENCOUNTER — Ambulatory Visit (HOSPITAL_BASED_OUTPATIENT_CLINIC_OR_DEPARTMENT_OTHER): Payer: Medicare Other | Admitting: Oncology

## 2015-02-20 ENCOUNTER — Telehealth: Payer: Self-pay | Admitting: Oncology

## 2015-02-20 VITALS — BP 149/60 | HR 58 | Temp 97.5°F | Resp 18 | Wt 140.6 lb

## 2015-02-20 DIAGNOSIS — C642 Malignant neoplasm of left kidney, except renal pelvis: Secondary | ICD-10-CM

## 2015-02-20 DIAGNOSIS — D6959 Other secondary thrombocytopenia: Secondary | ICD-10-CM

## 2015-02-20 DIAGNOSIS — C189 Malignant neoplasm of colon, unspecified: Secondary | ICD-10-CM

## 2015-02-20 DIAGNOSIS — R197 Diarrhea, unspecified: Secondary | ICD-10-CM

## 2015-02-20 DIAGNOSIS — C7802 Secondary malignant neoplasm of left lung: Secondary | ICD-10-CM | POA: Diagnosis not present

## 2015-02-20 NOTE — Progress Notes (Signed)
Hematology and Oncology Follow Up Visit  Hunter Hardin [id number] 11/10/1935 80 y.o. 02/20/2015 4:14 PM   Principle Diagnosis: 80 year old gentleman with:  1. T3 N0 colon cancer: He is status post a laparoscopic right hemicolectomy, with 0 out of 21 lymph nodes involved. That was done on 03/06/2013. 2. Renal cell carcinoma diagnosed in May of 2014. He presented with kidney mass and lung metastasis. 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  Current therapy: Started Votrient 800 mg daily on 05/22/2013. Dose was reduced to 600 mg daily on 01/19/14 due to weight loss and diarrhea. This will be reduced to 400 mg starting on 09/07/2014. The dose will be reduced further to 200 mg daily starting on 11/16/2014. The dose reduction is related to diarrhea and weight loss.  Interim History: Hunter Hardin presents today for a follow up visit.Since the last visit, he reports no new complaints. He reports improvement in his diarrhea and his appetite. His weight have been relatively stable in the last 3 months. He is eating better overall and started adding some protein powder to treat smoothies in the mornings which she continued to do regularly. He is tolerating the dose reduction of Votrient to 200 mg by mouth daily very well. He feels that taste is a lot better and able to enjoy food better.    He denies chest pain, shortness of breath, dyspnea on exertion. He denies abdominal pain. He does not report any hematochezia. Performance status and activity level are getting closer to baseline. He has not reported any hospitalization or illnesses.He has not reported any pain in his hands or feet. Has not reported any fevers or chills or sweats. He does not report any changes in his multiple status, headaches, blurred vision or seizures. He does not report any frequency urgency or hesitancy. Does not report any hematuria or dysuria. He does not  report any pain especially in any skeletal structures. He denies any back pain or hip pain. He is not report any lymphadenopathy or petechiae.  Remainder of his review of systems unremarkable.   Medications: I have reviewed the patient's current medications. Reviewed today and unchanged. Current Outpatient Prescriptions  Medication Sig Dispense Refill  . diphenoxylate-atropine (LOMOTIL) 2.5-0.025 MG per tablet Take 1 tablet by mouth 4 (four) times daily as needed for diarrhea or loose stools. 30 tablet 0  . doxazosin (CARDURA) 8 MG tablet TAKE 1 TABLET DAILY 90 tablet 0  . fish oil-omega-3 fatty acids 1000 MG capsule Take 1 g by mouth 2 (two) times daily.    . hydrochlorothiazide (HYDRODIURIL) 25 MG tablet TAKE 1 TABLET DAILY 90 tablet 3  . KLOR-CON M20 20 MEQ tablet TAKE 1 TABLET TWICE A DAY 180 tablet 0  . lactose free nutrition (BOOST) LIQD Take 237 mLs by mouth daily.    [name] levothyroxine (SYNTHROID, LEVOTHROID) 50 MCG tablet TAKE 2 TABLETS ON SUNDAY AND TAKE 1 TABLET ON OTHER DAYS (TAKE 8 PER WEEK TOTAL) 105 tablet 2  . ondansetron (ZOFRAN) 8 MG tablet Take 1 tablet (8 mg total) by mouth every 8 (eight) hours as needed for nausea. 20 tablet 2  . Pazopanib HCl (VOTRIENT PO) Take 200 mg by mouth daily.    . propranolol (INDERAL) 40 MG tablet Take 0.5 tablets (20 mg total) by mouth 2 (two) times daily.    . verapamil (CALAN-SR) 240 MG CR tablet TAKE 1 TABLET DAILY 90 tablet 1  .  vitamin C (ASCORBIC ACID) 500 MG tablet Take 500 mg by mouth daily.     No current facility-administered medications for this visit.    Allergies: No Known Allergies  Past Medical History, Surgical history, Social history, and Family History were reviewed and updated.    Physical Exam: Blood pressure 149/60, pulse 58, temperature 97.5 F (36.4 C), temperature source Oral, resp. rate 18, weight 140 lb 9 oz (63.759 kg), SpO2 99 %. ECOG: 1 General appearance: alert awake not in any distress. Appeared well  today. Head: Normocephalic, without obvious abnormality Neck: no adenopathy, no masses or lesions. Lymph nodes: Cervical, supraclavicular, and axillary nodes normal. Heart:regular rate and rhythm, S1, S2. No murmurs rubs or gallops. Lung:chest clear, no wheezing, rales, no dullness to percussion. Abdomen: soft, non-tender, without masses or organomegaly no shifting dullness or ascites. EXT:1+ pitting edema, bilateral lower extremities.  Skin: No rashes or lesions noted today.   Lab Results: Lab Results  Component Value Date   WBC 7.0 02/18/2015   HGB 12.4* 02/18/2015   HCT 38.7 02/18/2015   MCV 103.7* 02/18/2015   PLT 151 02/18/2015    EXAM: CT CHEST WITH CONTRAST  CT ABDOMEN AND PELVIS WITH AND WITHOUT CONTRAST  TECHNIQUE: Multidetector CT imaging of the chest was performed during intravenous contrast administration. Multidetector CT imaging of the abdomen and pelvis was performed following the standard protocol before and during bolus administration of intravenous contrast.  CONTRAST: 154mL OMNIPAQUE IOHEXOL 300 MG/ML SOLN  COMPARISON: 09/04/2014  FINDINGS: CT CHEST FINDINGS  Mediastinum/Nodes: No supraclavicular adenopathy. Aortic and branch vessel atherosclerosis. Mild cardiomegaly with multivessel coronary artery atherosclerosis. No pericardial effusion. No central pulmonary embolism, on this non-dedicated study.  Similar 1.0 cm subcarinal node on image 48.  Right infrahilar adenopathy at 1.2 cm on image 54 is unchanged to on the prior (when remeasured).  Right hilar adenopathy at 1.7 cm on image 44 versus 1.6 cm on the prior exam (when remeasured).  Left hilar node measures 9 mm on image 41 versus 8 mm on the prior  7 mm periesophageal node is similar.  Lungs/Pleura: Trace bilateral pleural effusions are similar  Subpleural reticulation with bibasilar Mild traction bronchiectasis.  A subpleural left upper lobe 4 mm nodule on image 46  is similar to slightly increased since the prior exam.  Centrally cavitary left lower lobe lung mass measures 4.1 x 2.9 cm on image 64 versus 2.8 x 2.1 cm on the prior exam.  Similar 5 mm superior segment left lower lobe pulmonary nodule.  6 mm left apical pulmonary nodule is unchanged.  Musculoskeletal: No acute osseous abnormality.  CT ABDOMEN PELVIS FINDINGS  Hepatobiliary: Normal liver. Cholecystectomy, without biliary ductal dilatation.  Pancreas: Cystic lesion within the pancreatic uncinate process is similar at 1.0 cm. No duct dilatation or acute pancreatitis.  Spleen: Normal  Adrenals/Urinary Tract: Normal adrenal glands. Interpolar right renal cyst.  Inter/ upper pole left renal mass measures 4.9 x 4.2 cm, enlarged from 4.3 x 3.5 cm on the prior. Peripheral enhancement which is increased.  No hydronephrosis. Apparent mild bladder wall thickening, possibly due to underdistention.  Stomach/Bowel: Proximal gastric underdistention. Colonic diverticulosis. Partial right hemicolectomy. Normal small bowel.  Vascular/Lymphatic: Aortic and branch vessel atherosclerosis. Patent left renal vein. No abdominopelvic adenopathy.  Reproductive: Moderate prostatomegaly.  Other: No significant free fluid. No evidence of omental or peritoneal disease. Tiny fat containing left inguinal hernia.  Musculoskeletal: Right iliac bone islands. Thoracolumbar degenerative disc disease/spondylosis. Tarlov cyst.  IMPRESSION: CT CHEST IMPRESSION  1. Enlargement of dominant partially cavitary left lower lobe lung mass. 2. Similar to slight progression of thoracic adenopathy. 3. Redemonstration of mild pulmonary fibrosis, of indeterminate etiology. 4. Other pulmonary nodules are not significantly changed and indeterminate. 5. Cardiomegaly with coronary artery atherosclerosis. 6. Similar small bilateral pleural effusions.  CT ABDOMEN AND PELVIS IMPRESSION  1.  Enlargement of dominant left renal mass. 2. No evidence of metastatic disease within the abdomen or pelvis. 3. Resolution of ascites. 4. Partial right hemicolectomy, without locally recurrent disease. 5. Prostatomegaly. Possible bladder wall thickening indicative of bladder outlet obstruction. Alternatively, this appearance could be due to underdistention. 6. Similar uncinate process cystic lesion. Most likely a pseudocyst and of doubtful clinical significance given comorbidities  A 80 year old gentleman with the following issues.   1. Colon cancer: He is status post a laparoscopic right hemicolectomy, with the pathology revealing a T3 N0, with 0 out of 21 lymph nodes involved. He did not receive adjuvant chemotherapy for colon cancer given that he has stage IV kidney cancer.   2. Kidney cancer: he has stage IV disease with lung involvment. He is currently on Votrient dose reduced to 200 mg daily. CT scan from 02/18/2015 was reviewed today and showed mostly stable disease. There is some slight increase in his cavitary lung lesion but no new lesions. His quality of life, and overall cancer is under control with the current dose and schedule. I plan on keeping on this current medication and repeat imaging studies in 4 months. If she develops progression of disease we will switch to different agent at that time.  3. Weight loss: Weight has been relatively stable in the last 3 months.  4. Diarrhea. Due to prior hemicolectomy versus Votrient. Improved with the dose reduction of Votrient.   5. Thrombocytopenia. Due to Votrient appears to have stabilized after the dose reduction.  6. Followup: Will be in 4 to 5 weeks.       Zola Button, MD 3/2/20164:14 PM

## 2015-02-20 NOTE — Telephone Encounter (Signed)
Gave avs & calendar for March. °

## 2015-03-04 ENCOUNTER — Other Ambulatory Visit: Payer: Self-pay | Admitting: Family Medicine

## 2015-03-20 ENCOUNTER — Ambulatory Visit (HOSPITAL_BASED_OUTPATIENT_CLINIC_OR_DEPARTMENT_OTHER): Payer: Medicare Other | Admitting: Physician Assistant

## 2015-03-20 ENCOUNTER — Other Ambulatory Visit (HOSPITAL_BASED_OUTPATIENT_CLINIC_OR_DEPARTMENT_OTHER): Payer: Medicare Other

## 2015-03-20 ENCOUNTER — Telehealth: Payer: Self-pay | Admitting: Physician Assistant

## 2015-03-20 ENCOUNTER — Encounter: Payer: Self-pay | Admitting: Physician Assistant

## 2015-03-20 VITALS — BP 124/60 | HR 52 | Temp 97.7°F | Resp 18 | Ht 66.0 in | Wt 139.2 lb

## 2015-03-20 DIAGNOSIS — R110 Nausea: Secondary | ICD-10-CM | POA: Diagnosis not present

## 2015-03-20 DIAGNOSIS — C642 Malignant neoplasm of left kidney, except renal pelvis: Secondary | ICD-10-CM

## 2015-03-20 DIAGNOSIS — C189 Malignant neoplasm of colon, unspecified: Secondary | ICD-10-CM

## 2015-03-20 DIAGNOSIS — D696 Thrombocytopenia, unspecified: Secondary | ICD-10-CM

## 2015-03-20 DIAGNOSIS — C7802 Secondary malignant neoplasm of left lung: Secondary | ICD-10-CM

## 2015-03-20 DIAGNOSIS — R197 Diarrhea, unspecified: Secondary | ICD-10-CM

## 2015-03-20 LAB — CBC WITH DIFFERENTIAL/PLATELET
BASO%: 0.2 % (ref 0.0–2.0)
Basophils Absolute: 0 10*3/uL (ref 0.0–0.1)
EOS%: 2.8 % (ref 0.0–7.0)
Eosinophils Absolute: 0.2 10*3/uL (ref 0.0–0.5)
HCT: 37.2 % — ABNORMAL LOW (ref 38.4–49.9)
HGB: 12 g/dL — ABNORMAL LOW (ref 13.0–17.1)
LYMPH#: 2.6 10*3/uL (ref 0.9–3.3)
LYMPH%: 45.8 % (ref 14.0–49.0)
MCH: 33.4 pg (ref 27.2–33.4)
MCHC: 32.3 g/dL (ref 32.0–36.0)
MCV: 103.6 fL — AB (ref 79.3–98.0)
MONO#: 0.5 10*3/uL (ref 0.1–0.9)
MONO%: 9.4 % (ref 0.0–14.0)
NEUT%: 41.8 % (ref 39.0–75.0)
NEUTROS ABS: 2.4 10*3/uL (ref 1.5–6.5)
PLATELETS: 118 10*3/uL — AB (ref 140–400)
RBC: 3.59 10*6/uL — ABNORMAL LOW (ref 4.20–5.82)
RDW: 14.5 % (ref 11.0–14.6)
WBC: 5.8 10*3/uL (ref 4.0–10.3)

## 2015-03-20 LAB — COMPREHENSIVE METABOLIC PANEL (CC13)
ALBUMIN: 3 g/dL — AB (ref 3.5–5.0)
ALK PHOS: 47 U/L (ref 40–150)
ALT: 15 U/L (ref 0–55)
AST: 17 U/L (ref 5–34)
Anion Gap: 6 mEq/L (ref 3–11)
BUN: 18.6 mg/dL (ref 7.0–26.0)
CALCIUM: 8.9 mg/dL (ref 8.4–10.4)
CO2: 25 mEq/L (ref 22–29)
Chloride: 108 mEq/L (ref 98–109)
Creatinine: 1 mg/dL (ref 0.7–1.3)
EGFR: 72 mL/min/{1.73_m2} — ABNORMAL LOW (ref >90)
GLUCOSE: 82 mg/dL (ref 70–140)
Potassium: 4.7 mEq/L (ref 3.5–5.1)
SODIUM: 139 meq/L (ref 136–145)
Total Bilirubin: 0.67 mg/dL (ref 0.20–1.20)
Total Protein: 6.3 g/dL — ABNORMAL LOW (ref 6.4–8.3)

## 2015-03-20 NOTE — Progress Notes (Signed)
Hematology and Oncology Follow Up Visit  Hunter Hardin [id number] 13-Feb-1935 80 y.o. 03/20/2015 9:13 AM   Principle Diagnosis: 80 year old gentleman with:  1. T3 N0 colon cancer: He is status post a laparoscopic right hemicolectomy, with 0 out of 21 lymph nodes involved. That was done on 03/06/2013. 2. Renal cell carcinoma diagnosed in May of 2014. He presented with kidney mass and lung metastasis. 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  Current therapy: Started Votrient 800 mg daily on 05/22/2013. Dose was reduced to 600 mg daily on 01/19/14 due to weight loss and diarrhea. This will be reduced to 400 mg starting on 09/07/2014. The dose will be reduced further to 200 mg daily starting on 11/16/2014. The dose reduction is related to diarrhea and weight loss.  Interim History: Hunter Hardin presents today for a follow up visit.Since the last visit, he reports no new complaints, except for some nausea in the past week or so. The nausea is relieved with his current antiemetic.Hunter Hardin He reports improvement in his diarrhea and his appetite. His weight have been relatively stable in the last 3 months, but he has lost a few pounds since his last office visit.Hunter Hardin He is eating better overall and started adding some protein powder to treat smoothies in the mornings which she continued to do regularly. He is tolerating the dose reduction of Votrient to 200 mg by mouth daily very well.    He denies chest pain, shortness of breath, dyspnea on exertion. He denies abdominal pain. He does not report any hematochezia. Performance status and activity level are getting closer to baseline. He has not reported any hospitalization or illnesses.He has not reported any pain in his hands or feet. Has not reported any fevers or chills or sweats. He does not report any changes in his multiple status, headaches, blurred vision or seizures. He does not report  any frequency urgency or hesitancy. Does not report any hematuria or dysuria. He does not report any pain especially in any skeletal structures. He denies any back pain or hip pain. He is not report any lymphadenopathy or petechiae.  Remainder of his review of systems unremarkable.   Medications: I have reviewed the patient's current medications. Reviewed today and unchanged. Current Outpatient Prescriptions  Medication Sig Dispense Refill  . diphenoxylate-atropine (LOMOTIL) 2.5-0.025 MG per tablet Take 1 tablet by mouth 4 (four) times daily as needed for diarrhea or loose stools. 30 tablet 0  . doxazosin (CARDURA) 8 MG tablet TAKE 1 TABLET DAILY 90 tablet 0  . fish oil-omega-3 fatty acids 1000 MG capsule Take 1 g by mouth 2 (two) times daily.    . hydrochlorothiazide (HYDRODIURIL) 25 MG tablet TAKE 1 TABLET DAILY 90 tablet 3  . KLOR-CON M20 20 MEQ tablet TAKE 1 TABLET TWICE A DAY 180 tablet 0  . lactose free nutrition (BOOST) LIQD Take 237 mLs by mouth daily.    Hunter Hardin levothyroxine (SYNTHROID, LEVOTHROID) 50 MCG tablet TAKE 2 TABLETS ON SUNDAY AND TAKE 1 TABLET ON OTHER DAYS (TAKE 8 PER WEEK TOTAL) 105 tablet 2  . ondansetron (ZOFRAN) 8 MG tablet Take 1 tablet (8 mg total) by mouth every 8 (eight) hours as needed for nausea. 20 tablet 2  . Pazopanib HCl (VOTRIENT PO) Take 200 mg by mouth daily.    . propranolol (INDERAL) 40 MG tablet Take 0.5 tablets (20 mg total) by mouth 2 (two) times daily.    Hunter Hardin  verapamil (CALAN-SR) 240 MG CR tablet TAKE 1 TABLET DAILY 90 tablet 1  . vitamin C (ASCORBIC ACID) 500 MG tablet Take 500 mg by mouth daily.     No current facility-administered medications for this visit.    Allergies: No Known Allergies  Past Medical History, Surgical history, Social history, and Family History were reviewed and updated.    Physical Exam: Blood pressure 124/60, pulse 52, temperature 97.7 F (36.5 C), temperature source Oral, resp. rate 18, height 5\' 6"  (1.676 m), weight 139 lb  3.2 oz (63.141 kg), SpO2 100 %. ECOG: 1 General appearance: alert awake not in any distress. Appeared well today. Head: Normocephalic, without obvious abnormality Neck: no adenopathy, no masses or lesions. Lymph nodes: Cervical, supraclavicular, and axillary nodes normal. Heart:regular rate and rhythm, S1, S2. No murmurs rubs or gallops. Lung:chest clear, no wheezing, rales, no dullness to percussion. Abdomen: soft, non-tender, without masses or organomegaly no shifting dullness or ascites. EXT:1+ pitting edema, bilateral lower extremities.  Skin: No rashes or lesions noted today.   Lab Results: Lab Results  Component Value Date   WBC 5.8 03/20/2015   HGB 12.0* 03/20/2015   HCT 37.2* 03/20/2015   MCV 103.6* 03/20/2015   PLT 118* 03/20/2015    EXAM: CT CHEST WITH CONTRAST  CT ABDOMEN AND PELVIS WITH AND WITHOUT CONTRAST  TECHNIQUE: Multidetector CT imaging of the chest was performed during intravenous contrast administration. Multidetector CT imaging of the abdomen and pelvis was performed following the standard protocol before and during bolus administration of intravenous contrast.  CONTRAST: 120mL OMNIPAQUE IOHEXOL 300 MG/ML SOLN  COMPARISON: 09/04/2014  FINDINGS: CT CHEST FINDINGS  Mediastinum/Nodes: No supraclavicular adenopathy. Aortic and branch vessel atherosclerosis. Mild cardiomegaly with multivessel coronary artery atherosclerosis. No pericardial effusion. No central pulmonary embolism, on this non-dedicated study.  Similar 1.0 cm subcarinal node on image 48.  Right infrahilar adenopathy at 1.2 cm on image 54 is unchanged to on the prior (when remeasured).  Right hilar adenopathy at 1.7 cm on image 44 versus 1.6 cm on the prior exam (when remeasured).  Left hilar node measures 9 mm on image 41 versus 8 mm on the prior  7 mm periesophageal node is similar.  Lungs/Pleura: Trace bilateral pleural effusions are similar  Subpleural  reticulation with bibasilar Mild traction bronchiectasis.  A subpleural left upper lobe 4 mm nodule on image 46 is similar to slightly increased since the prior exam.  Centrally cavitary left lower lobe lung mass measures 4.1 x 2.9 cm on image 64 versus 2.8 x 2.1 cm on the prior exam.  Similar 5 mm superior segment left lower lobe pulmonary nodule.  6 mm left apical pulmonary nodule is unchanged.  Musculoskeletal: No acute osseous abnormality.  CT ABDOMEN PELVIS FINDINGS  Hepatobiliary: Normal liver. Cholecystectomy, without biliary ductal dilatation.  Pancreas: Cystic lesion within the pancreatic uncinate process is similar at 1.0 cm. No duct dilatation or acute pancreatitis.  Spleen: Normal  Adrenals/Urinary Tract: Normal adrenal glands. Interpolar right renal cyst.  Inter/ upper pole left renal mass measures 4.9 x 4.2 cm, enlarged from 4.3 x 3.5 cm on the prior. Peripheral enhancement which is increased.  No hydronephrosis. Apparent mild bladder wall thickening, possibly due to underdistention.  Stomach/Bowel: Proximal gastric underdistention. Colonic diverticulosis. Partial right hemicolectomy. Normal small bowel.  Vascular/Lymphatic: Aortic and branch vessel atherosclerosis. Patent left renal vein. No abdominopelvic adenopathy.  Reproductive: Moderate prostatomegaly.  Other: No significant free fluid. No evidence of omental or peritoneal disease. Tiny fat containing left  inguinal hernia.  Musculoskeletal: Right iliac bone islands. Thoracolumbar degenerative disc disease/spondylosis. Tarlov cyst.  IMPRESSION: CT CHEST IMPRESSION  1. Enlargement of dominant partially cavitary left lower lobe lung mass. 2. Similar to slight progression of thoracic adenopathy. 3. Redemonstration of mild pulmonary fibrosis, of indeterminate etiology. 4. Other pulmonary nodules are not significantly changed and indeterminate. 5. Cardiomegaly with coronary  artery atherosclerosis. 6. Similar small bilateral pleural effusions.  CT ABDOMEN AND PELVIS IMPRESSION  1. Enlargement of dominant left renal mass. 2. No evidence of metastatic disease within the abdomen or pelvis. 3. Resolution of ascites. 4. Partial right hemicolectomy, without locally recurrent disease. 5. Prostatomegaly. Possible bladder wall thickening indicative of bladder outlet obstruction. Alternatively, this appearance could be due to underdistention. 6. Similar uncinate process cystic lesion. Most likely a pseudocyst and of doubtful clinical significance given comorbidities  A 80 year old gentleman with the following issues.   1. Colon cancer: He is status post a laparoscopic right hemicolectomy, with the pathology revealing a T3 N0, with 0 out of 21 lymph nodes involved. He did not receive adjuvant chemotherapy for colon cancer given that he has stage IV kidney cancer.   2. Kidney cancer: he has stage IV disease with lung involvment. He is currently on Votrient dose reduced to 200 mg daily. CT scan from 02/18/2015 was reviewed today and showed mostly stable disease. There is some slight increase in his cavitary lung lesion but no new lesions. His quality of life, and overall cancer is under control with the current dose and schedule. The plan is to continue on this current medication and repeat imaging studies in 4 months. If he develops progression of disease we will switch to different agent at that time.  3. Weight loss: Weight has been relatively stable in the last 3 months.  4. Diarrhea. Due to prior hemicolectomy versus Votrient. Improved with the dose reduction of Votrient.   5. Thrombocytopenia. Due to Votrient appears to have stabilized after the dose reduction.  6. Nausea: likely related to the Votrient, Well managed with Zofran  7. Followup: Will be in 4 to 5 weeks.       Awilda Metro E, PA-C  3/30/20169:13 AM

## 2015-03-20 NOTE — Telephone Encounter (Signed)
Gave avs & calendar for May. °

## 2015-03-20 NOTE — Patient Instructions (Signed)
Continue Votrient at the current dose Take your Zofran as needed for nausea Follow-up in one month

## 2015-04-03 DIAGNOSIS — I10 Essential (primary) hypertension: Secondary | ICD-10-CM | POA: Diagnosis not present

## 2015-04-03 DIAGNOSIS — H25099 Other age-related incipient cataract, unspecified eye: Secondary | ICD-10-CM | POA: Diagnosis not present

## 2015-04-03 DIAGNOSIS — H35033 Hypertensive retinopathy, bilateral: Secondary | ICD-10-CM | POA: Diagnosis not present

## 2015-04-03 DIAGNOSIS — H3530 Unspecified macular degeneration: Secondary | ICD-10-CM | POA: Diagnosis not present

## 2015-04-18 ENCOUNTER — Other Ambulatory Visit: Payer: Self-pay | Admitting: Oncology

## 2015-04-19 ENCOUNTER — Other Ambulatory Visit: Payer: Self-pay | Admitting: *Deleted

## 2015-04-19 DIAGNOSIS — C649 Malignant neoplasm of unspecified kidney, except renal pelvis: Secondary | ICD-10-CM

## 2015-04-19 DIAGNOSIS — C189 Malignant neoplasm of colon, unspecified: Secondary | ICD-10-CM

## 2015-04-19 MED ORDER — PAZOPANIB HCL 200 MG PO TABS: 200.0000 mg | ORAL_TABLET | Freq: Every day | ORAL | Status: DC

## 2015-04-24 ENCOUNTER — Telehealth: Payer: Self-pay | Admitting: *Deleted

## 2015-04-24 ENCOUNTER — Ambulatory Visit (HOSPITAL_BASED_OUTPATIENT_CLINIC_OR_DEPARTMENT_OTHER): Payer: Medicare Other | Admitting: Oncology

## 2015-04-24 ENCOUNTER — Telehealth: Payer: Self-pay | Admitting: Oncology

## 2015-04-24 ENCOUNTER — Other Ambulatory Visit (HOSPITAL_BASED_OUTPATIENT_CLINIC_OR_DEPARTMENT_OTHER): Payer: Medicare Other

## 2015-04-24 VITALS — BP 161/62 | HR 45 | Temp 97.5°F | Resp 16 | Ht 66.0 in | Wt 142.3 lb

## 2015-04-24 DIAGNOSIS — C7802 Secondary malignant neoplasm of left lung: Secondary | ICD-10-CM

## 2015-04-24 DIAGNOSIS — D696 Thrombocytopenia, unspecified: Secondary | ICD-10-CM | POA: Diagnosis not present

## 2015-04-24 DIAGNOSIS — C189 Malignant neoplasm of colon, unspecified: Secondary | ICD-10-CM

## 2015-04-24 DIAGNOSIS — R634 Abnormal weight loss: Secondary | ICD-10-CM

## 2015-04-24 DIAGNOSIS — R197 Diarrhea, unspecified: Secondary | ICD-10-CM

## 2015-04-24 DIAGNOSIS — C642 Malignant neoplasm of left kidney, except renal pelvis: Secondary | ICD-10-CM

## 2015-04-24 DIAGNOSIS — C649 Malignant neoplasm of unspecified kidney, except renal pelvis: Secondary | ICD-10-CM

## 2015-04-24 LAB — CBC WITH DIFFERENTIAL/PLATELET
BASO%: 0.2 % (ref 0.0–2.0)
BASOS ABS: 0 10*3/uL (ref 0.0–0.1)
EOS%: 4.3 % (ref 0.0–7.0)
Eosinophils Absolute: 0.3 10*3/uL (ref 0.0–0.5)
HEMATOCRIT: 38.3 % — AB (ref 38.4–49.9)
HEMOGLOBIN: 12.9 g/dL — AB (ref 13.0–17.1)
LYMPH#: 2.4 10*3/uL (ref 0.9–3.3)
LYMPH%: 36 % (ref 14.0–49.0)
MCH: 34.3 pg — ABNORMAL HIGH (ref 27.2–33.4)
MCHC: 33.5 g/dL (ref 32.0–36.0)
MCV: 102.1 fL — AB (ref 79.3–98.0)
MONO#: 0.6 10*3/uL (ref 0.1–0.9)
MONO%: 8.8 % (ref 0.0–14.0)
NEUT#: 3.3 10*3/uL (ref 1.5–6.5)
NEUT%: 50.7 % (ref 39.0–75.0)
PLATELETS: 149 10*3/uL (ref 140–400)
RBC: 3.75 10*6/uL — ABNORMAL LOW (ref 4.20–5.82)
RDW: 15 % — ABNORMAL HIGH (ref 11.0–14.6)
WBC: 6.6 10*3/uL (ref 4.0–10.3)

## 2015-04-24 LAB — COMPREHENSIVE METABOLIC PANEL (CC13)
ALT: 17 U/L (ref 0–55)
ANION GAP: 11 meq/L (ref 3–11)
AST: 20 U/L (ref 5–34)
Albumin: 3.3 g/dL — ABNORMAL LOW (ref 3.5–5.0)
Alkaline Phosphatase: 53 U/L (ref 40–150)
BUN: 18.7 mg/dL (ref 7.0–26.0)
CALCIUM: 9.2 mg/dL (ref 8.4–10.4)
CHLORIDE: 107 meq/L (ref 98–109)
CO2: 22 mEq/L (ref 22–29)
Creatinine: 1 mg/dL (ref 0.7–1.3)
EGFR: 72 mL/min/{1.73_m2} — ABNORMAL LOW (ref >90)
Glucose: 87 mg/dl (ref 70–140)
Potassium: 4.3 mEq/L (ref 3.5–5.1)
Sodium: 140 mEq/L (ref 136–145)
Total Bilirubin: 0.64 mg/dL (ref 0.20–1.20)
Total Protein: 6.9 g/dL (ref 6.4–8.3)

## 2015-04-24 MED ORDER — PAZOPANIB HCL 200 MG PO TABS: 200.0000 mg | ORAL_TABLET | Freq: Every day | ORAL | Status: DC

## 2015-04-24 NOTE — Telephone Encounter (Signed)
Gave patient avs report and appointments for June.  °

## 2015-04-24 NOTE — Progress Notes (Signed)
Hematology and Oncology Follow Up Visit  Hunter Hardin [id number] 1935/06/29 79 y.o. 04/24/2015 8:25 AM   Principle Diagnosis: 80 year old gentleman with:  1. T3 N0 colon cancer: He is status post a laparoscopic right hemicolectomy, with 0 out of 21 lymph nodes involved. That was done on 03/06/2013.  2. Renal cell carcinoma diagnosed in May of 2014. He presented with kidney mass and lung metastasis. 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  Current therapy: Started Votrient 800 mg daily on 05/22/2013. Dose was reduced to 600 mg daily on 01/19/14 due to weight loss and diarrhea. Then reduced to 400 mg starting on 09/07/2014. The dose will be reduced further to 200 mg daily starting on 11/16/2014. The dose reduction is related to diarrhea and weight loss.  Interim History: Hunter Hardin presents today for a follow up visit. Since the last visit, he continues to do very well. He reports improvement in his diarrhea and his appetite. His weight is up by 3 pounds since the last visit. He is eating better overall and started adding some protein powder to treat smoothies in the mornings which she continued to do regularly. He is tolerating the dose reduction of Votrient to 200 mg by mouth daily very well.  He is reporting loose bowel habits about 2-3 times a day and have been relatively manageable. He is able to work outside the house and attends to activities of daily living. His quality of life remains excellent.  He denies chest pain, shortness of breath, dyspnea on exertion. He denies abdominal pain. He does not report any hematochezia. Performance status and activity level are getting closer to baseline. He has not reported any hospitalization or illnesses.He has not reported any pain in his hands or feet. Has not reported any fevers or chills or sweats. He does not report any changes in his multiple status, headaches, blurred  vision or seizures. He does not report any frequency urgency or hesitancy. Does not report any hematuria or dysuria. He does not report any pain especially in any skeletal structures. He denies any back pain or hip pain. He is not report any lymphadenopathy or petechiae.  Remainder of his review of systems unremarkable.   Medications: I have reviewed the patient's current medications. Reviewed today and unchanged. Current Outpatient Prescriptions  Medication Sig Dispense Refill  . diphenoxylate-atropine (LOMOTIL) 2.5-0.025 MG per tablet Take 1 tablet by mouth 4 (four) times daily as needed for diarrhea or loose stools. 30 tablet 0  . doxazosin (CARDURA) 8 MG tablet TAKE 1 TABLET DAILY 90 tablet 0  . fish oil-omega-3 fatty acids 1000 MG capsule Take 1 g by mouth 2 (two) times daily.    . hydrochlorothiazide (HYDRODIURIL) 25 MG tablet TAKE 1 TABLET DAILY 90 tablet 3  . KLOR-CON M20 20 MEQ tablet TAKE 1 TABLET TWICE A DAY 180 tablet 0  . lactose free nutrition (BOOST) LIQD Take 237 mLs by mouth daily.    [name] levothyroxine (SYNTHROID, LEVOTHROID) 50 MCG tablet TAKE 2 TABLETS ON SUNDAY AND TAKE 1 TABLET ON OTHER DAYS (TAKE 8 PER WEEK TOTAL) 105 tablet 2  . ondansetron (ZOFRAN) 8 MG tablet Take 1 tablet (8 mg total) by mouth every 8 (eight) hours as needed for nausea. 20 tablet 2  . pazopanib (VOTRIENT) 200 MG tablet Take 1 tablet (200 mg total) by mouth daily. 90 tablet 0  . propranolol (INDERAL) 40 MG tablet Take 0.5  tablets (20 mg total) by mouth 2 (two) times daily.    . verapamil (CALAN-SR) 240 MG CR tablet TAKE 1 TABLET DAILY 90 tablet 1  . vitamin C (ASCORBIC ACID) 500 MG tablet Take 500 mg by mouth daily.     No current facility-administered medications for this visit.    Allergies: No Known Allergies  Past Medical History, Surgical history, Social history, and Family History were reviewed and updated.    Physical Exam: Blood pressure 161/62, pulse 45, temperature 97.5 F (36.4 C),  temperature source Oral, resp. rate 16, height '5\' 6"'$  (1.676 m), weight 142 lb 4.8 oz (64.547 kg), SpO2 100 %. ECOG: 1 General appearance: alert awake not in any distress. Appeared well today. Head: Normocephalic, without obvious abnormality Neck: no adenopathy, no masses or lesions. Lymph nodes: Cervical, supraclavicular, and axillary nodes normal. Heart:regular rate and rhythm, S1, S2. No murmurs rubs or gallops. Lung:chest clear, no wheezing, rales, no dullness to percussion. Abdomen: soft, non-tender, without masses or organomegaly no shifting dullness or ascites. EXT:1+ pitting edema, bilateral lower extremities.  Skin: No rashes or lesions noted today.   Lab Results: Lab Results  Component Value Date   WBC 6.6 04/24/2015   HGB 12.9* 04/24/2015   HCT 38.3* 04/24/2015   MCV 102.1* 04/24/2015   PLT 149 04/24/2015     A 79 year old gentleman with the following issues.   1. Colon cancer: He is status post a laparoscopic right hemicolectomy, with the pathology revealing a T3 N0, with 0 out of 21 lymph nodes involved. He did not receive adjuvant chemotherapy for colon cancer given that he has stage IV kidney cancer.   2. Kidney cancer: he has stage IV disease with lung involvment. He is currently on Votrient dose reduced to 200 mg daily. CT scan from 02/18/2015 showed mostly stable disease. There is some slight increase in his cavitary lung lesion but no new lesions. His quality of life, and overall cancer is under control with the current dose and schedule. The plan is to continue on this current medication and repeat imaging studies in July 2016. His scans will be scheduled in the next visit.  3. Weight loss: Weight has been relatively stable in the last 3 months and have improved by 3 pounds since the last visit.  4. Diarrhea. Due to prior hemicolectomy versus Votrient. Improved with the dose reduction of Votrient.   5. Thrombocytopenia. Due to Votrient appears to have stabilized  after the dose reduction.  6. Nausea: likely related to the Votrient, Well managed with Zofran  7. Followup: Will be in 4 to 5 weeks.       [facility], MD 5/4/20168:25 AM

## 2015-04-24 NOTE — Telephone Encounter (Signed)
Hunter Hardin, [facility], from [facility] called to verify the dosage of Votrient. He stated,"received e-scribed prescription for Votrient 200 mg po daily. Wanted to verify reduction in dose." Per Dr. Hazeline Junker office note, patient has been reduced to 200 mg po daily due to diarrhea. Hunter Hardin verbalized understanding and will get prescription ready for delivery.

## 2015-04-30 ENCOUNTER — Other Ambulatory Visit: Payer: Self-pay | Admitting: Family Medicine

## 2015-04-30 NOTE — Telephone Encounter (Signed)
Last office visit 07/02/2014.  Last refilled 02/01/2015 for #180 with no refills. No future appointments scheduled. Ok to refill?

## 2015-05-01 NOTE — Telephone Encounter (Signed)
Please schedule appointment as instructed. 

## 2015-05-01 NOTE — Telephone Encounter (Signed)
Sent.  It would be reasonable to get patient in this summer for a 34mn OV. Thanks.

## 2015-05-23 ENCOUNTER — Other Ambulatory Visit (HOSPITAL_BASED_OUTPATIENT_CLINIC_OR_DEPARTMENT_OTHER): Payer: Medicare Other

## 2015-05-23 ENCOUNTER — Ambulatory Visit (HOSPITAL_BASED_OUTPATIENT_CLINIC_OR_DEPARTMENT_OTHER): Payer: Medicare Other | Admitting: Physician Assistant

## 2015-05-23 ENCOUNTER — Other Ambulatory Visit: Payer: Self-pay | Admitting: Family Medicine

## 2015-05-23 ENCOUNTER — Telehealth: Payer: Self-pay | Admitting: Oncology

## 2015-05-23 ENCOUNTER — Encounter: Payer: Self-pay | Admitting: Physician Assistant

## 2015-05-23 VITALS — BP 125/57 | HR 48 | Temp 97.7°F | Resp 17 | Ht 66.0 in | Wt 143.6 lb

## 2015-05-23 DIAGNOSIS — C189 Malignant neoplasm of colon, unspecified: Secondary | ICD-10-CM

## 2015-05-23 DIAGNOSIS — R110 Nausea: Secondary | ICD-10-CM

## 2015-05-23 DIAGNOSIS — D696 Thrombocytopenia, unspecified: Secondary | ICD-10-CM

## 2015-05-23 DIAGNOSIS — C642 Malignant neoplasm of left kidney, except renal pelvis: Secondary | ICD-10-CM

## 2015-05-23 DIAGNOSIS — R197 Diarrhea, unspecified: Secondary | ICD-10-CM

## 2015-05-23 DIAGNOSIS — C7802 Secondary malignant neoplasm of left lung: Secondary | ICD-10-CM | POA: Diagnosis not present

## 2015-05-23 DIAGNOSIS — R634 Abnormal weight loss: Secondary | ICD-10-CM

## 2015-05-23 DIAGNOSIS — C649 Malignant neoplasm of unspecified kidney, except renal pelvis: Secondary | ICD-10-CM

## 2015-05-23 LAB — CBC WITH DIFFERENTIAL/PLATELET
BASO%: 0.3 % (ref 0.0–2.0)
BASOS ABS: 0 10*3/uL (ref 0.0–0.1)
EOS%: 3.2 % (ref 0.0–7.0)
Eosinophils Absolute: 0.2 10*3/uL (ref 0.0–0.5)
HEMATOCRIT: 35.4 % — AB (ref 38.4–49.9)
HGB: 11.8 g/dL — ABNORMAL LOW (ref 13.0–17.1)
LYMPH%: 35.5 % (ref 14.0–49.0)
MCH: 34.6 pg — ABNORMAL HIGH (ref 27.2–33.4)
MCHC: 33.4 g/dL (ref 32.0–36.0)
MCV: 103.7 fL — ABNORMAL HIGH (ref 79.3–98.0)
MONO#: 0.5 10*3/uL (ref 0.1–0.9)
MONO%: 8.6 % (ref 0.0–14.0)
NEUT#: 3.2 10*3/uL (ref 1.5–6.5)
NEUT%: 52.4 % (ref 39.0–75.0)
Platelets: 134 10*3/uL — ABNORMAL LOW (ref 140–400)
RBC: 3.42 10*6/uL — ABNORMAL LOW (ref 4.20–5.82)
RDW: 14.7 % — ABNORMAL HIGH (ref 11.0–14.6)
WBC: 6.2 10*3/uL (ref 4.0–10.3)
lymph#: 2.2 10*3/uL (ref 0.9–3.3)

## 2015-05-23 LAB — COMPREHENSIVE METABOLIC PANEL (CC13)
ALT: 12 U/L (ref 0–55)
ANION GAP: 6 meq/L (ref 3–11)
AST: 17 U/L (ref 5–34)
Albumin: 2.8 g/dL — ABNORMAL LOW (ref 3.5–5.0)
Alkaline Phosphatase: 49 U/L (ref 40–150)
BUN: 18.7 mg/dL (ref 7.0–26.0)
CALCIUM: 8.6 mg/dL (ref 8.4–10.4)
CO2: 25 mEq/L (ref 22–29)
Chloride: 109 mEq/L (ref 98–109)
Creatinine: 0.9 mg/dL (ref 0.7–1.3)
EGFR: 82 mL/min/{1.73_m2} — AB (ref >90)
Glucose: 87 mg/dl (ref 70–140)
POTASSIUM: 4.2 meq/L (ref 3.5–5.1)
SODIUM: 141 meq/L (ref 136–145)
Total Bilirubin: 0.65 mg/dL (ref 0.20–1.20)
Total Protein: 6 g/dL — ABNORMAL LOW (ref 6.4–8.3)

## 2015-05-23 NOTE — Telephone Encounter (Signed)
Sent. Thanks.   

## 2015-05-23 NOTE — Telephone Encounter (Signed)
Gave and printed apt sched and avs for pt for June

## 2015-05-23 NOTE — Telephone Encounter (Signed)
Electronic refill request. Last Filled:    20 tablet 2 RF on 08/02/2014  Please advise.

## 2015-05-23 NOTE — Progress Notes (Signed)
Hematology and Oncology Follow Up Visit  Hunter Hardin [id number] 06-23-1935 80 y.o. 05/23/2015 9:00 AM   Principle Diagnosis: 80 year old gentleman with:  1. T3 N0 colon cancer: He is status post a laparoscopic right hemicolectomy, with 0 out of 21 lymph nodes involved. That was done on 03/06/2013.  2. Renal cell carcinoma diagnosed in May of 2014. He presented with kidney mass and lung metastasis. 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  Current therapy: Started Votrient 800 mg daily on 05/22/2013. Dose was reduced to 600 mg daily on 01/19/14 due to weight loss and diarrhea. Then reduced to 400 mg starting on 09/07/2014. The dose will be reduced further to 200 mg daily starting on 11/16/2014. The dose reduction is related to diarrhea and weight loss.  Interim History: Hunter Hardin presents today for a follow up visit. Since the last visit, he continues to do very well. He reports improvement in his diarrhea and his appetite. His appetite is good. His weight is up by approximately 1-1/2  pounds since the last visit. He is eating better overall .he does report occasional episodes of diarrhea but states that this is manageable and significantly improved from when he was on a higher dose of the Votrient. He does have some occasional episodes of nausea and his primary care physician has prescribed Zofran for him which is helpful. He will get his refill for the Zofran from his primary care physician.  He is tolerating the dose reduction of Votrient to 200 mg by mouth daily very well.  He continues to able to work outside the house and attends to activities of daily living. His quality of life remains excellent.  He denies chest pain, shortness of breath, dyspnea on exertion. He denies abdominal pain. He does not report any hematochezia. Performance status and activity level are getting closer to baseline. He has not reported any  hospitalization or illnesses.He has not reported any pain in his hands or feet. Has not reported any fevers or chills or sweats. He does not report any changes in his multiple status, headaches, blurred vision or seizures. He does not report any frequency urgency or hesitancy. Does not report any hematuria or dysuria. He does not report any pain especially in any skeletal structures. He denies any back pain or hip pain. He is not report any lymphadenopathy or petechiae.  Remainder of his review of systems unremarkable.   Medications: I have reviewed the patient's current medications. Reviewed today and unchanged. Current Outpatient Prescriptions  Medication Sig Dispense Refill  . diphenoxylate-atropine (LOMOTIL) 2.5-0.025 MG per tablet Take 1 tablet by mouth 4 (four) times daily as needed for diarrhea or loose stools. 30 tablet 0  . doxazosin (CARDURA) 8 MG tablet TAKE 1 TABLET DAILY 90 tablet 0  . fish oil-omega-3 fatty acids 1000 MG capsule Take 1 g by mouth 2 (two) times daily.    . hydrochlorothiazide (HYDRODIURIL) 25 MG tablet TAKE 1 TABLET DAILY 90 tablet 3  . KLOR-CON M20 20 MEQ tablet TAKE 1 TABLET TWICE A DAY 180 tablet 1  . lactose free nutrition (BOOST) LIQD Take 237 mLs by mouth daily.    [name] levothyroxine (SYNTHROID, LEVOTHROID) 50 MCG tablet TAKE 2 TABLETS ON SUNDAY AND TAKE 1 TABLET ON OTHER DAYS (TAKE 8 PER WEEK TOTAL) 105 tablet 2  . ondansetron (ZOFRAN) 8 MG tablet Take 1 tablet (8 mg total) by mouth every 8 (eight) hours  as needed for nausea. 20 tablet 2  . pazopanib (VOTRIENT) 200 MG tablet Take 1 tablet (200 mg total) by mouth daily. 30 tablet 0  . propranolol (INDERAL) 40 MG tablet Take 0.5 tablets (20 mg total) by mouth 2 (two) times daily.    . verapamil (CALAN-SR) 240 MG CR tablet TAKE 1 TABLET DAILY 90 tablet 1  . vitamin C (ASCORBIC ACID) 500 MG tablet Take 500 mg by mouth daily.     No current facility-administered medications for this visit.    Allergies: No Known  Allergies  Past Medical History, Surgical history, Social history, and Family History were reviewed and updated.    Physical Exam: Blood pressure 125/57, pulse 48, temperature 97.7 F (36.5 C), temperature source Oral, resp. rate 17, height '5\' 6"'$  (1.676 m), weight 143 lb 9.6 oz (65.137 kg), SpO2 99 %. ECOG: 1 General appearance: alert awake not in any distress. Appeared well today. Head: Normocephalic, without obvious abnormality Neck: no adenopathy, no masses or lesions. Lymph nodes: Cervical, supraclavicular, and axillary nodes normal. Heart:regular rate and rhythm, S1, S2. No murmurs rubs or gallops. Lung:chest clear, no wheezing, rales, no dullness to percussion. Abdomen: soft, non-tender, without masses or organomegaly no shifting dullness or ascites. EXT:1+ pitting edema, bilateral lower extremities.  Skin: No rashes or lesions noted today.   Lab Results: Lab Results  Component Value Date   WBC 6.2 05/23/2015   HGB 11.8* 05/23/2015   HCT 35.4* 05/23/2015   MCV 103.7* 05/23/2015   PLT 134* 05/23/2015     A 80 year old gentleman with the following issues.   1. Colon cancer: He is status post a laparoscopic right hemicolectomy, with the pathology revealing a T3 N0, with 0 out of 21 lymph nodes involved. He did not receive adjuvant chemotherapy for colon cancer given that he has stage IV kidney cancer.   2. Kidney cancer: he has stage IV disease with lung involvment. He is currently on Votrient dose reduced to 200 mg daily. CT scan from 02/18/2015 showed mostly stable disease. There is some slight increase in his cavitary lung lesion but no new lesions. His quality of life, and overall cancer is under control with the current dose and schedule. The plan is to continue on this current medication. I have ordered repeat imaging studies for early July 2016.   3. Weight loss: Weight has been relatively stable in the last 3 months and have improved by 1.5 pounds since the last  visit.  4. Diarrhea. Due to prior hemicolectomy versus Votrient. Improved with the dose reduction of Votrient.   5. Thrombocytopenia. Due to Votrient appears to have stabilized after the dose reduction.  6. Nausea: likely related to the Votrient, Well managed with Zofran  7. Followup: Will be in 4 to 5 weeks.       Awilda Metro E, PA-C  6/2/20169:00 AM

## 2015-05-23 NOTE — Patient Instructions (Signed)
Continue Votrient 200 mg by mouth daily Follow-up in one month with a restaging CT scans of the chest, abdomen and pelvis to reevaluate your disease

## 2015-05-31 ENCOUNTER — Other Ambulatory Visit: Payer: Self-pay | Admitting: Family Medicine

## 2015-05-31 NOTE — Telephone Encounter (Signed)
Electronic refill request. Last office visit:   07/02/14.  ,Last Filled:    90 tablet 0 RF on 03/04/2015  Please advise.

## 2015-05-31 NOTE — Telephone Encounter (Signed)
Sent.  I'd like to see him when possible.  Thanks.

## 2015-06-03 NOTE — Telephone Encounter (Signed)
Left detailed message on voicemail of cell and home phone.

## 2015-06-05 ENCOUNTER — Encounter: Payer: Self-pay | Admitting: Family Medicine

## 2015-06-05 ENCOUNTER — Ambulatory Visit (INDEPENDENT_AMBULATORY_CARE_PROVIDER_SITE_OTHER): Payer: Medicare Other | Admitting: Family Medicine

## 2015-06-05 VITALS — BP 130/70 | HR 60 | Temp 98.3°F | Wt 143.2 lb

## 2015-06-05 DIAGNOSIS — C642 Malignant neoplasm of left kidney, except renal pelvis: Secondary | ICD-10-CM | POA: Diagnosis not present

## 2015-06-05 DIAGNOSIS — E038 Other specified hypothyroidism: Secondary | ICD-10-CM | POA: Diagnosis not present

## 2015-06-05 DIAGNOSIS — E039 Hypothyroidism, unspecified: Secondary | ICD-10-CM

## 2015-06-05 DIAGNOSIS — Z7189 Other specified counseling: Secondary | ICD-10-CM

## 2015-06-05 DIAGNOSIS — I10 Essential (primary) hypertension: Secondary | ICD-10-CM | POA: Diagnosis not present

## 2015-06-05 MED ORDER — VERAPAMIL HCL ER 240 MG PO TBCR: 240.0000 mg | EXTENDED_RELEASE_TABLET | Freq: Every day | ORAL | Status: DC

## 2015-06-05 MED ORDER — POTASSIUM CHLORIDE CRYS ER 20 MEQ PO TBCR: 20.0000 meq | EXTENDED_RELEASE_TABLET | Freq: Two times a day (BID) | ORAL | Status: DC

## 2015-06-05 MED ORDER — LEVOTHYROXINE SODIUM 50 MCG PO TABS: ORAL_TABLET | ORAL | Status: DC

## 2015-06-05 MED ORDER — PROPRANOLOL HCL 40 MG PO TABS: 20.0000 mg | ORAL_TABLET | Freq: Two times a day (BID) | ORAL | Status: DC

## 2015-06-05 MED ORDER — DOXAZOSIN MESYLATE 8 MG PO TABS: ORAL_TABLET | ORAL | Status: DC

## 2015-06-05 MED ORDER — HYDROCHLOROTHIAZIDE 25 MG PO TABS: 25.0000 mg | ORAL_TABLET | Freq: Every day | ORAL | Status: DC

## 2015-06-05 NOTE — Patient Instructions (Signed)
I would ask Dr. Alen Blew about getting the extra pneumonia shot.  I think it will be okay to do, but I want his input.  I'll ask oncology about getting your thyroid test done with the next set of labs.  I sent your meds to the pharmacy.   Take care.  Glad to see you.

## 2015-06-05 NOTE — Progress Notes (Signed)
Pre visit review using our clinic review tool, if applicable. No additional management support is needed unless otherwise documented below in the visit note.  Rare diarrhea, rare nausea.  Imodium and zofran help with both.  Rare use overall.  RCC per oncology on votrient and doing well overall on lower dose of med.   Hypertension:    Using medication without problems or lightheadedness: yes Chest pain with exertion:no Edema:no Short of breath:no Average home BPs: usually 130s/60s.    Hypothyroid.  No lump or mass in the neck.  Due for TSH.  No dysphagia.  D/w pt.  Energy level is good.    Meds, vitals, and allergies reviewed.   PMH and SH reviewed  ROS: See HPI.  Otherwise negative.    GEN: nad, alert and oriented HEENT: mucous membranes moist NECK: supple w/o LA CV: rrr. PULM: ctab, no inc wob ABD: soft, +bs EXT: no edema SKIN: no acute rash

## 2015-06-06 ENCOUNTER — Encounter: Payer: Self-pay | Admitting: Family Medicine

## 2015-06-06 ENCOUNTER — Telehealth: Payer: Self-pay | Admitting: Family Medicine

## 2015-06-06 DIAGNOSIS — Z7189 Other specified counseling: Secondary | ICD-10-CM | POA: Insufficient documentation

## 2015-06-06 NOTE — Assessment & Plan Note (Signed)
Per onc, with GI sx minimized on current dosing of votrient.  App oncology help.

## 2015-06-06 NOTE — Assessment & Plan Note (Signed)
Will ask for TSH to be done with next set of labs with oncology to minimize lab draws.  No change in med in the meantime.  >25 minutes spent in face to face time with patient, >50% spent in counselling or coordination of care.

## 2015-06-06 NOTE — Assessment & Plan Note (Signed)
Controlled, continue as is.  Recent Cr okay.  D/w pt.

## 2015-06-06 NOTE — Telephone Encounter (Signed)
Call pt.  Okay to get PNA 13 vaccine when possible, either here or at next oncology appointment if possible.  Okay to schedule RN visit here if needed.  Onc is okay with getting thyroid test with next set of labs there.   Thanks.

## 2015-06-06 NOTE — Telephone Encounter (Signed)
Left message on patient's voicemail to return call

## 2015-06-10 NOTE — Telephone Encounter (Signed)
Patient advised.

## 2015-06-17 ENCOUNTER — Other Ambulatory Visit: Payer: Self-pay

## 2015-06-19 ENCOUNTER — Ambulatory Visit (INDEPENDENT_AMBULATORY_CARE_PROVIDER_SITE_OTHER): Payer: Medicare Other | Admitting: *Deleted

## 2015-06-19 DIAGNOSIS — Z23 Encounter for immunization: Secondary | ICD-10-CM

## 2015-06-20 ENCOUNTER — Ambulatory Visit (HOSPITAL_BASED_OUTPATIENT_CLINIC_OR_DEPARTMENT_OTHER): Payer: Medicare Other | Admitting: Oncology

## 2015-06-20 ENCOUNTER — Telehealth: Payer: Self-pay | Admitting: Oncology

## 2015-06-20 ENCOUNTER — Other Ambulatory Visit (HOSPITAL_BASED_OUTPATIENT_CLINIC_OR_DEPARTMENT_OTHER): Payer: Medicare Other

## 2015-06-20 VITALS — BP 141/58 | HR 56 | Temp 97.5°F | Resp 18 | Ht 66.0 in | Wt 143.7 lb

## 2015-06-20 DIAGNOSIS — C189 Malignant neoplasm of colon, unspecified: Secondary | ICD-10-CM

## 2015-06-20 DIAGNOSIS — C649 Malignant neoplasm of unspecified kidney, except renal pelvis: Secondary | ICD-10-CM

## 2015-06-20 DIAGNOSIS — C7802 Secondary malignant neoplasm of left lung: Secondary | ICD-10-CM

## 2015-06-20 DIAGNOSIS — C642 Malignant neoplasm of left kidney, except renal pelvis: Secondary | ICD-10-CM

## 2015-06-20 LAB — COMPREHENSIVE METABOLIC PANEL (CC13)
ALBUMIN: 3 g/dL — AB (ref 3.5–5.0)
ALK PHOS: 48 U/L (ref 40–150)
ALT: 14 U/L (ref 0–55)
AST: 17 U/L (ref 5–34)
Anion Gap: 7 mEq/L (ref 3–11)
BUN: 18.8 mg/dL (ref 7.0–26.0)
CHLORIDE: 112 meq/L — AB (ref 98–109)
CO2: 24 meq/L (ref 22–29)
Calcium: 9.1 mg/dL (ref 8.4–10.4)
Creatinine: 1.1 mg/dL (ref 0.7–1.3)
EGFR: 68 mL/min/{1.73_m2} — ABNORMAL LOW (ref >90)
Glucose: 100 mg/dl (ref 70–140)
POTASSIUM: 4.3 meq/L (ref 3.5–5.1)
Sodium: 143 mEq/L (ref 136–145)
TOTAL PROTEIN: 6.2 g/dL — AB (ref 6.4–8.3)
Total Bilirubin: 0.61 mg/dL (ref 0.20–1.20)

## 2015-06-20 LAB — CBC WITH DIFFERENTIAL/PLATELET
BASO%: 0.2 % (ref 0.0–2.0)
Basophils Absolute: 0 10*3/uL (ref 0.0–0.1)
EOS ABS: 0.2 10*3/uL (ref 0.0–0.5)
EOS%: 3 % (ref 0.0–7.0)
HCT: 37 % — ABNORMAL LOW (ref 38.4–49.9)
HGB: 12.2 g/dL — ABNORMAL LOW (ref 13.0–17.1)
LYMPH#: 2.4 10*3/uL (ref 0.9–3.3)
LYMPH%: 37.8 % (ref 14.0–49.0)
MCH: 35 pg — AB (ref 27.2–33.4)
MCHC: 33 g/dL (ref 32.0–36.0)
MCV: 106 fL — ABNORMAL HIGH (ref 79.3–98.0)
MONO#: 0.5 10*3/uL (ref 0.1–0.9)
MONO%: 7.5 % (ref 0.0–14.0)
NEUT#: 3.2 10*3/uL (ref 1.5–6.5)
NEUT%: 51.5 % (ref 39.0–75.0)
PLATELETS: 128 10*3/uL — AB (ref 140–400)
RBC: 3.49 10*6/uL — AB (ref 4.20–5.82)
RDW: 14.3 % (ref 11.0–14.6)
WBC: 6.3 10*3/uL (ref 4.0–10.3)
nRBC: 0 % (ref 0–0)

## 2015-06-20 NOTE — Progress Notes (Signed)
Hematology and Oncology Follow Up Visit  Hunter Hardin [id number] 11-07-1935 80 y.o. 06/20/2015 10:07 AM   Principle Diagnosis: 80 year old gentleman with:  1. T3 N0 colon cancer: He is status post a laparoscopic right hemicolectomy, with 0 out of 21 lymph nodes involved. That was done on 03/06/2013.  2. Renal cell carcinoma diagnosed in May of 2014. He presented with kidney mass and lung metastasis. 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  Current therapy: Started Votrient 800 mg daily on 05/22/2013. Dose was reduced to 600 mg daily on 01/19/14 due to weight loss and diarrhea. Then reduced to 400 mg starting on 09/07/2014. The dose will be reduced further to 200 mg daily starting on 11/16/2014. The dose reduction is related to diarrhea and weight loss.  Interim History: Hunter Hardin presents today for a follow up visit. Since the last visit, he reports no complaints. He reports slight diarrhea which has not been significant at this time. He also reports that his appetite is good. His weight is up by a few pounds since the last visit.    He does have some occasional episodes of nausea and Zofran have helped at this time.  He is tolerating the dose reduction of Votrient to 200 mg very well.  He continues to able to work outside the house and attends to activities of daily living. His quality of life remains excellent.   He is not report any headaches, blurry vision or syncope. He denies chest pain, shortness of breath, dyspnea on exertion. He denies abdominal pain. He does not report any hematochezia. Performance status and activity level are getting closer to baseline. He has not reported any hospitalization or illnesses.He has not reported any pain in his hands or feet. Has not reported any fevers or chills or sweats.  He does not report any frequency urgency or hesitancy. Does not report any hematuria or dysuria. He does  not report any pain especially in any skeletal structures. He denies any back pain or hip pain. He is not report any lymphadenopathy or petechiae.  Remainder of his review of systems unremarkable.   Medications: I have reviewed the patient's current medications. Reviewed today and unchanged. Current Outpatient Prescriptions  Medication Sig Dispense Refill  . doxazosin (CARDURA) 8 MG tablet TAKE 1 TABLET DAILY 90 tablet 3  . fish oil-omega-3 fatty acids 1000 MG capsule Take 1 g by mouth 2 (two) times daily.    . hydrochlorothiazide (HYDRODIURIL) 25 MG tablet Take 1 tablet (25 mg total) by mouth daily. 90 tablet 3  . lactose free nutrition (BOOST) LIQD Take 237 mLs by mouth daily.    [name] levothyroxine (SYNTHROID, LEVOTHROID) 50 MCG tablet TAKE 2 TABLETS ON SUNDAY AND TAKE 1 TABLET ON OTHER DAYS (TAKE 8 PER WEEK TOTAL) 105 tablet 3  . loperamide (IMODIUM A-D) 2 MG tablet Take 2 mg by mouth as needed for diarrhea or loose stools.    . ondansetron (ZOFRAN) 8 MG tablet TAKE ONE TABLET BY MOUTH EVERY 8 HOURS AS NEEDED FOR NAUSEA 20 tablet 2  . pazopanib (VOTRIENT) 200 MG tablet Take 1 tablet (200 mg total) by mouth daily. 30 tablet 0  . potassium chloride SA (KLOR-CON M20) 20 MEQ tablet Take 1 tablet (20 mEq total) by mouth 2 (two) times daily. 180 tablet 3  . propranolol (INDERAL) 40 MG tablet Take 0.5 tablets (20 mg total) by mouth 2 (two) times daily.  90 tablet 3  . verapamil (CALAN-SR) 240 MG CR tablet Take 1 tablet (240 mg total) by mouth daily. 90 tablet 3  . vitamin C (ASCORBIC ACID) 500 MG tablet Take 500 mg by mouth daily.     No current facility-administered medications for this visit.    Allergies: No Known Allergies  Past Medical History, Surgical history, Social history, and Family History were reviewed and updated.    Physical Exam: Blood pressure 141/58, pulse 56, temperature 97.5 F (36.4 C), temperature source Oral, resp. rate 18, height '5\' 6"'$  (1.676 m), weight 143 lb 11.2 oz  (65.182 kg), SpO2 100 %. ECOG: 1 General appearance: alert awake not in any distress.  Head: Normocephalic, without obvious abnormality Neck: no adenopathy, no masses or lesions. Lymph nodes: Cervical, supraclavicular, and axillary nodes normal. Heart:regular rate and rhythm, S1, S2. No murmurs rubs or gallops. Lung:chest clear, no wheezing, rales, no dullness to percussion. Abdomen: soft, non-tender, without masses or organomegaly  EXT:1+ pitting edema, bilateral lower extremities.  Skin: No rashes or lesions noted today.   Lab Results: Lab Results  Component Value Date   WBC 6.2 05/23/2015   HGB 11.8* 05/23/2015   HCT 35.4* 05/23/2015   MCV 103.7* 05/23/2015   PLT 134* 05/23/2015     CBC    Component Value Date/Time   WBC 6.3 06/20/2015 0924   WBC 5.7 04/02/2014 1416   RBC 3.49* 06/20/2015 0924   RBC 2.95* 04/02/2014 1416   HGB 12.2* 06/20/2015 0924   HGB 10.6* 04/02/2014 1416   HCT 37.0* 06/20/2015 0924   HCT 31.3* 04/02/2014 1416   PLT 128* 06/20/2015 0924   PLT 126.0* 04/02/2014 1416   MCV 106.0* 06/20/2015 0924   MCV 106.3* 04/02/2014 1416   MCH 35.0* 06/20/2015 0924   MCH 27.2 05/03/2013 0900   MCHC 33.0 06/20/2015 0924   MCHC 33.8 04/02/2014 1416   RDW 14.3 06/20/2015 0924   RDW 15.4* 04/02/2014 1416   LYMPHSABS 2.4 06/20/2015 0924   LYMPHSABS 2.5 04/02/2014 1416   MONOABS 0.5 06/20/2015 0924   MONOABS 0.4 04/02/2014 1416   EOSABS 0.2 06/20/2015 0924   EOSABS 0.2 04/02/2014 1416   BASOSABS 0.0 06/20/2015 0924   BASOSABS 0.0 04/02/2014 3669     A 80 year old gentleman with the following issues.   1. Colon cancer: He is status post a laparoscopic right hemicolectomy, with the pathology revealing a T3 N0, with 0 out of 21 lymph nodes involved. He did not receive adjuvant chemotherapy for colon cancer given that he has stage IV kidney cancer.   2. Renal cell cancer: he has stage IV disease with lung involvment. He is currently on Votrient dose reduced to  200 mg daily. CT scan from 02/18/2015 showed mostly stable disease. There is some slight increase in his cavitary lung lesion but no new lesions. His quality of life, and overall cancer is under control with the current dose and schedule. The plan is to continue on this current medication. I have ordered repeat imaging studies on 07/08/2015. If he shows progression of disease, different salvage regimen would be needed. I discussed with him different options at this time would include oral medication versus intravenous treatments. We will make that decision after the CT scan.  3. Weight loss: Weight has been relatively stable.  4. Diarrhea. Due to prior hemicolectomy versus Votrient. Improved with the dose reduction of Votrient.   5. Thrombocytopenia. Due to Votrient appears to have stabilized after the dose reduction. Platelet counts appear  adequate at this time.  6. Nausea:Well managed with Zofran  7. Followup: Will be after his next CT scan scheduled in 07/08/2015.       [name], MD 6/30/201610:07 AM

## 2015-06-20 NOTE — Telephone Encounter (Signed)
Pt confirmed labs/ov per 06/30 POF, gave pt AVS and Calendar... KJ, gave pt barium

## 2015-07-08 ENCOUNTER — Ambulatory Visit (HOSPITAL_COMMUNITY): Payer: TRICARE For Life (TFL)

## 2015-07-09 ENCOUNTER — Ambulatory Visit (HOSPITAL_COMMUNITY)
Admission: RE | Admit: 2015-07-09 | Discharge: 2015-07-09 | Disposition: A | Discharge status: Home / Self Care | Payer: Medicare Other | Source: Ambulatory Visit | Attending: Physician Assistant | Admitting: Physician Assistant

## 2015-07-09 ENCOUNTER — Encounter (HOSPITAL_COMMUNITY): Payer: Self-pay

## 2015-07-09 ENCOUNTER — Other Ambulatory Visit (HOSPITAL_BASED_OUTPATIENT_CLINIC_OR_DEPARTMENT_OTHER): Payer: Medicare Other

## 2015-07-09 DIAGNOSIS — C189 Malignant neoplasm of colon, unspecified: Secondary | ICD-10-CM | POA: Insufficient documentation

## 2015-07-09 DIAGNOSIS — C642 Malignant neoplasm of left kidney, except renal pelvis: Secondary | ICD-10-CM | POA: Diagnosis present

## 2015-07-09 DIAGNOSIS — Z79899 Other long term (current) drug therapy: Secondary | ICD-10-CM | POA: Diagnosis not present

## 2015-07-09 DIAGNOSIS — Z09 Encounter for follow-up examination after completed treatment for conditions other than malignant neoplasm: Secondary | ICD-10-CM | POA: Diagnosis not present

## 2015-07-09 DIAGNOSIS — J439 Emphysema, unspecified: Secondary | ICD-10-CM | POA: Diagnosis not present

## 2015-07-09 DIAGNOSIS — R918 Other nonspecific abnormal finding of lung field: Secondary | ICD-10-CM | POA: Diagnosis not present

## 2015-07-09 DIAGNOSIS — C7802 Secondary malignant neoplasm of left lung: Secondary | ICD-10-CM

## 2015-07-09 DIAGNOSIS — J90 Pleural effusion, not elsewhere classified: Secondary | ICD-10-CM | POA: Diagnosis not present

## 2015-07-09 DIAGNOSIS — C649 Malignant neoplasm of unspecified kidney, except renal pelvis: Secondary | ICD-10-CM

## 2015-07-09 DIAGNOSIS — R590 Localized enlarged lymph nodes: Secondary | ICD-10-CM | POA: Diagnosis not present

## 2015-07-09 LAB — CBC WITH DIFFERENTIAL/PLATELET
BASO%: 0.1 % (ref 0.0–2.0)
BASOS ABS: 0 10*3/uL (ref 0.0–0.1)
EOS%: 2.4 % (ref 0.0–7.0)
Eosinophils Absolute: 0.2 10*3/uL (ref 0.0–0.5)
HCT: 38.7 % (ref 38.4–49.9)
HEMOGLOBIN: 13 g/dL (ref 13.0–17.1)
LYMPH%: 37.8 % (ref 14.0–49.0)
MCH: 34.2 pg — AB (ref 27.2–33.4)
MCHC: 33.5 g/dL (ref 32.0–36.0)
MCV: 101.9 fL — ABNORMAL HIGH (ref 79.3–98.0)
MONO#: 0.6 10*3/uL (ref 0.1–0.9)
MONO%: 8.2 % (ref 0.0–14.0)
NEUT#: 4 10*3/uL (ref 1.5–6.5)
NEUT%: 51.5 % (ref 39.0–75.0)
Platelets: 152 10*3/uL (ref 140–400)
RBC: 3.8 10*6/uL — ABNORMAL LOW (ref 4.20–5.82)
RDW: 13.7 % (ref 11.0–14.6)
WBC: 7.8 10*3/uL (ref 4.0–10.3)
lymph#: 2.9 10*3/uL (ref 0.9–3.3)

## 2015-07-09 LAB — COMPREHENSIVE METABOLIC PANEL (CC13)
ALBUMIN: 3.3 g/dL — AB (ref 3.5–5.0)
ALT: 14 U/L (ref 0–55)
ANION GAP: 10 meq/L (ref 3–11)
AST: 20 U/L (ref 5–34)
Alkaline Phosphatase: 51 U/L (ref 40–150)
BILIRUBIN TOTAL: 0.69 mg/dL (ref 0.20–1.20)
BUN: 22.5 mg/dL (ref 7.0–26.0)
CO2: 27 mEq/L (ref 22–29)
Calcium: 9.3 mg/dL (ref 8.4–10.4)
Chloride: 102 mEq/L (ref 98–109)
Creatinine: 1.1 mg/dL (ref 0.7–1.3)
EGFR: 64 mL/min/{1.73_m2} — AB (ref >90)
Glucose: 84 mg/dl (ref 70–140)
POTASSIUM: 3.1 meq/L — AB (ref 3.5–5.1)
Sodium: 139 mEq/L (ref 136–145)
Total Protein: 6.9 g/dL (ref 6.4–8.3)

## 2015-07-09 MED ORDER — IOHEXOL 300 MG/ML  SOLN
100.0000 mL | Freq: Once | INTRAMUSCULAR | Status: AC | PRN
  Administered 2015-07-09: 100 mL via INTRAVENOUS

## 2015-07-12 ENCOUNTER — Telehealth: Payer: Self-pay | Admitting: Oncology

## 2015-07-12 ENCOUNTER — Ambulatory Visit (HOSPITAL_BASED_OUTPATIENT_CLINIC_OR_DEPARTMENT_OTHER): Payer: Medicare Other | Admitting: Oncology

## 2015-07-12 VITALS — BP 132/60 | HR 58 | Temp 97.5°F | Resp 17 | Ht 66.0 in | Wt 140.9 lb

## 2015-07-12 DIAGNOSIS — R634 Abnormal weight loss: Secondary | ICD-10-CM | POA: Diagnosis not present

## 2015-07-12 DIAGNOSIS — C189 Malignant neoplasm of colon, unspecified: Secondary | ICD-10-CM | POA: Diagnosis not present

## 2015-07-12 DIAGNOSIS — R197 Diarrhea, unspecified: Secondary | ICD-10-CM

## 2015-07-12 DIAGNOSIS — C642 Malignant neoplasm of left kidney, except renal pelvis: Secondary | ICD-10-CM | POA: Diagnosis not present

## 2015-07-12 DIAGNOSIS — C7802 Secondary malignant neoplasm of left lung: Secondary | ICD-10-CM | POA: Diagnosis not present

## 2015-07-12 DIAGNOSIS — R110 Nausea: Secondary | ICD-10-CM | POA: Diagnosis not present

## 2015-07-12 DIAGNOSIS — D696 Thrombocytopenia, unspecified: Secondary | ICD-10-CM

## 2015-07-12 DIAGNOSIS — C649 Malignant neoplasm of unspecified kidney, except renal pelvis: Secondary | ICD-10-CM

## 2015-07-12 NOTE — Telephone Encounter (Signed)
Gave and printed appt sched and avs for pt for AUG  °

## 2015-07-12 NOTE — Progress Notes (Signed)
Hematology and Oncology Follow Up Visit  Hunter Hardin [id number] 1935/01/05 80 y.o. 07/12/2015 12:50 PM   Principle Diagnosis: 80 year old gentleman with:  1. T3 N0 colon cancer: He is status post a laparoscopic right hemicolectomy, with 0 out of 21 lymph nodes involved. That was done on 03/06/2013.  2. Renal cell carcinoma diagnosed in May of 2014. He presented with kidney mass and lung metastasis. 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  Current therapy: Started Votrient 800 mg daily on 05/22/2013. Dose was reduced to 600 mg daily on 01/19/14 due to weight loss and diarrhea. Then reduced to 400 mg starting on 09/07/2014. The dose will be reduced further to 200 mg daily starting on 11/16/2014. The dose reduction is related to diarrhea and weight loss.  Interim History: Hunter Hardin presents today for a follow up visit. Since the last visit, he continues to be relatively stable. He reports no  New complaints. He reports slight diarrhea which she has not had last week. He also reports that his appetite is good. His weight is down a few pounds since the last visit. His weight has fluctuated like that for many months..   He is tolerating the dose reduction of Votrient to 200 mg very well.  He continues to able to work outside the house and attends to activities of daily living. His quality of life remains excellent.   He is not report any headaches, blurry vision or syncope. He denies chest pain, shortness of breath, dyspnea on exertion. He denies abdominal pain. He does not report any hematochezia. Performance status and activity level are getting closer to baseline. He has not reported any hospitalization or illnesses.He has not reported any pain in his hands or feet. Has not reported any fevers or chills or sweats.  He does not report any frequency urgency or hesitancy. Does not report any hematuria or dysuria. He does  not report any pain especially in any skeletal structures. He denies any back pain or hip pain. He is not report any lymphadenopathy or petechiae.  Remainder of his review of systems unremarkable.   Medications: I have reviewed the patient's current medications. Reviewed today and unchanged. Current Outpatient Prescriptions  Medication Sig Dispense Refill  . doxazosin (CARDURA) 8 MG tablet TAKE 1 TABLET DAILY 90 tablet 3  . fish oil-omega-3 fatty acids 1000 MG capsule Take 1 g by mouth 2 (two) times daily.    . hydrochlorothiazide (HYDRODIURIL) 25 MG tablet Take 1 tablet (25 mg total) by mouth daily. 90 tablet 3  . lactose free nutrition (BOOST) LIQD Take 237 mLs by mouth daily.    [name] levothyroxine (SYNTHROID, LEVOTHROID) 50 MCG tablet TAKE 2 TABLETS ON SUNDAY AND TAKE 1 TABLET ON OTHER DAYS (TAKE 8 PER WEEK TOTAL) 105 tablet 3  . loperamide (IMODIUM A-D) 2 MG tablet Take 2 mg by mouth as needed for diarrhea or loose stools.    . ondansetron (ZOFRAN) 8 MG tablet TAKE ONE TABLET BY MOUTH EVERY 8 HOURS AS NEEDED FOR NAUSEA 20 tablet 2  . pazopanib (VOTRIENT) 200 MG tablet Take 1 tablet (200 mg total) by mouth daily. 30 tablet 0  . potassium chloride SA (KLOR-CON M20) 20 MEQ tablet Take 1 tablet (20 mEq total) by mouth 2 (two) times daily. 180 tablet 3  . propranolol (INDERAL) 40 MG tablet Take 0.5 tablets (20 mg total) by mouth 2 (two) times daily. 90 tablet  3  . verapamil (CALAN-SR) 240 MG CR tablet Take 1 tablet (240 mg total) by mouth daily. 90 tablet 3  . vitamin C (ASCORBIC ACID) 500 MG tablet Take 500 mg by mouth daily.     No current facility-administered medications for this visit.    Allergies: No Known Allergies  Past Medical History, Surgical history, Social history, and Family History were reviewed and updated.    Physical Exam: Blood pressure 132/60, pulse 58, temperature 97.5 F (36.4 C), temperature source Oral, resp. rate 17, height '5\' 6"'$  (1.676 m), weight 140 lb 14.4 oz  (63.912 kg), SpO2 99 %. ECOG: 1 General appearance: alert awake gentleman not in any distress. Head: Normocephalic, without obvious abnormality Neck: no adenopathy, no masses or lesions. Lymph nodes: Cervical, supraclavicular, and axillary nodes normal. Heart:regular rate and rhythm, S1, S2. No murmurs rubs or gallops. Lung:chest clear, no wheezing, rales, no dullness to percussion. Abdomen: soft, non-tender, without masses or organomegaly no rebound or guarding. EXT:1+ pitting edema, bilateral lower extremities.  Skin: No rashes or lesions noted today.   Lab Results: Lab Results  Component Value Date   WBC 7.8 07/09/2015   HGB 13.0 07/09/2015   HCT 38.7 07/09/2015   MCV 101.9* 07/09/2015   PLT 152 07/09/2015     CBC    Component Value Date/Time   WBC 7.8 07/09/2015 0743   WBC 5.7 04/02/2014 1416   RBC 3.80* 07/09/2015 0743   RBC 2.95* 04/02/2014 1416   HGB 13.0 07/09/2015 0743   HGB 10.6* 04/02/2014 1416   HCT 38.7 07/09/2015 0743   HCT 31.3* 04/02/2014 1416   PLT 152 07/09/2015 0743   PLT 126.0* 04/02/2014 1416   MCV 101.9* 07/09/2015 0743   MCV 106.3* 04/02/2014 1416   MCH 34.2* 07/09/2015 0743   MCH 27.2 05/03/2013 0900   MCHC 33.5 07/09/2015 0743   MCHC 33.8 04/02/2014 1416   RDW 13.7 07/09/2015 0743   RDW 15.4* 04/02/2014 1416   LYMPHSABS 2.9 07/09/2015 0743   LYMPHSABS 2.5 04/02/2014 1416   MONOABS 0.6 07/09/2015 0743   MONOABS 0.4 04/02/2014 1416   EOSABS 0.2 07/09/2015 0743   EOSABS 0.2 04/02/2014 1416   BASOSABS 0.0 07/09/2015 0743   BASOSABS 0.0 04/02/2014 1416   EXAM: CT CHEST, ABDOMEN, AND PELVIS WITH CONTRAST  TECHNIQUE: Multidetector CT imaging of the chest, abdomen and pelvis was performed following the standard protocol during bolus administration of intravenous contrast.  CONTRAST: 128m OMNIPAQUE IOHEXOL 300 MG/ML SOLN  COMPARISON: 02/18/2015  FINDINGS: CT CHEST FINDINGS  Mediastinum/Nodes: Thyroid is normal. Great vessels  are normal in caliber. Trace superior pericardial recess fluid. Moderate atheromatous aortic and coronary arterial calcifications without aneurysm.  Borderline cardiomegaly with left atrial prominence.  Stable 4 mm AP window lymph node image 42.  Stable 0.8 cm left hilar lymph node image 46.  Confluent right hilar lymph nodes measure 1.2 cm image 50, slightly smaller from re- measured 1.4 cm at similar anatomic location on the prior exam.  Sub carinal node now 0.7 cm image 53, previously 1.0 cm.  Right infrahilar node now 0.8 cm image 61, previously 1.2 cm.  Periesophageal node now 0.6 cm image 78 is larger than previous measurement 0.3 cm.  Left epicardial node 1.3 cm image 78, previously 2 mm.  Lungs/Pleura: Dominant cavitary left lower lobe mass now measures 4.6 x 3.1 cm image 69, previously 4.1 x 2.9 cm.  Superior segment left lower lobe pulmonary nodule now 5 mm image 40, subjectively minimally increase in  size but not numerically changed.  Left upper lobe pulmonary nodule now 0.5 cm image 26, likely not significantly changed since previous measurement 0.6 cm allowing for slice selection differences and small size.  Mild emphysematous changes are noted. Stable subpleural 1-2 mm scattered pulmonary parenchymal nodules. Right middle lobe granuloma.  Stable 0.5 cm right lower lobe pulmonary nodule image 75.  Trace pleural effusions.  Upper abdomen: Please see dedicated report below.  Musculoskeletal: No acute osseous abnormality or evidence for osseous metastatic disease in the chest.  CT ABDOMEN AND PELVIS FINDINGS  Lower chest: Please see dedicated report above  Hepatobiliary: Stable degree of mild central intrahepatic ductal prominence, status post cholecystectomy. Common duct dilatation, maximal diameter 1.1 cm image 99, is stable, with tapering to the ampulla.  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  Spleen: Normal  Adrenals/Urinary Tract: Exophytic irregularly rim enhancing left upper pole mass with extension to the left renal hilum on image 110 now measures 5.3 x 4.4 cm, increased from 4.9 x 4.2 cm. Too small to characterize renal cortical lesions and right mid renal cortical cyst are otherwise stable. No hydroureteronephrosis. Adrenal glands are normal. No radiopaque renal or ureteral calculus. Impression upon the bladder base by enlarged prostate measuring 5.6 cm is noted.  Stomach/Bowel: Evidence of appendectomy. No bowel wall thickening or focal segmental dilatation is identified.  Vascular/Lymphatic: Severe concentric atheromatous aortic calcification noted without aneurysm.  Left external iliac chain lymph node is enlarged measuring 1 cm image 164, not seen previously. Aortic caval node measuring 5 mm image 110 is stable.  Other: No ascites or free air. Free fluid in the left pericolic gutter incidentally noted.  Musculoskeletal: No evidence for osseous metastatic disease. Probable right iliac bone island reidentified.  IMPRESSION: Although some mediastinal nodes are stable or decreased in size, the overall picture is compatible with progression of disease, with increase in size of dominant left lower lobe pulmonary parenchymal mass, primary left upper pole renal cell carcinoma, and development of new epicardial and left external iliac chain lymphadenopathy.  Several lymph nodes demonstrate central apparent necrosis.   A 80 year old gentleman with the following issues.   1. Colon cancer: He is status post a laparoscopic right hemicolectomy, with the pathology revealing a T3 N0, with 0 out of 21 lymph nodes involved. He did not receive adjuvant chemotherapy for colon cancer given  that he has stage IV kidney cancer.   2. Renal cell cancer: he has stage IV disease with lung involvment. He is currently on Votrient dose reduced to 200 mg daily.   CT scan from 06/2015 was discussed today and showed mostly stable disease. There is some slight increase in his cavitary lung lesion but no new lesions. Overall, I feel that his disease is relatively stable and has a reasonable quality of life on the current dose and schedule. The plan is to continue with the same medication and changed to a different salvage therapy with clear progression. This would include symptomatic progression and development of new lesions. He is agreeable with this plan at this time.  3. Weight loss: Weight has been relatively stable. He has fluctuated up and down but seems to be relatively stable.  4. Diarrhea. Due to prior hemicolectomy versus Votrient. Improved with the dose reduction of Votrient.   5. Thrombocytopenia. Due to Votrient appears  to have stabilized after the dose reduction. Platelet counts remained normal after the dose reduction.  6. Nausea: Controlled by Zofran.  7. Followup: Will be in one month.       Zola Button, MD 7/22/201612:50 PM

## 2015-07-25 DIAGNOSIS — C182 Malignant neoplasm of ascending colon: Secondary | ICD-10-CM | POA: Diagnosis not present

## 2015-08-14 ENCOUNTER — Other Ambulatory Visit: Payer: Self-pay | Admitting: Oncology

## 2015-08-16 ENCOUNTER — Other Ambulatory Visit (HOSPITAL_BASED_OUTPATIENT_CLINIC_OR_DEPARTMENT_OTHER): Payer: Medicare Other

## 2015-08-16 ENCOUNTER — Ambulatory Visit (HOSPITAL_BASED_OUTPATIENT_CLINIC_OR_DEPARTMENT_OTHER): Payer: Medicare Other | Admitting: Oncology

## 2015-08-16 ENCOUNTER — Telehealth: Payer: Self-pay | Admitting: Oncology

## 2015-08-16 VITALS — BP 111/81 | HR 56 | Temp 98.1°F | Resp 18 | Ht 66.0 in | Wt 144.5 lb

## 2015-08-16 DIAGNOSIS — C7802 Secondary malignant neoplasm of left lung: Secondary | ICD-10-CM

## 2015-08-16 DIAGNOSIS — C649 Malignant neoplasm of unspecified kidney, except renal pelvis: Secondary | ICD-10-CM

## 2015-08-16 DIAGNOSIS — D696 Thrombocytopenia, unspecified: Secondary | ICD-10-CM | POA: Diagnosis not present

## 2015-08-16 DIAGNOSIS — C642 Malignant neoplasm of left kidney, except renal pelvis: Secondary | ICD-10-CM

## 2015-08-16 DIAGNOSIS — C189 Malignant neoplasm of colon, unspecified: Secondary | ICD-10-CM | POA: Diagnosis not present

## 2015-08-16 DIAGNOSIS — R197 Diarrhea, unspecified: Secondary | ICD-10-CM

## 2015-08-16 DIAGNOSIS — R110 Nausea: Secondary | ICD-10-CM | POA: Diagnosis not present

## 2015-08-16 LAB — CBC WITH DIFFERENTIAL/PLATELET
BASO%: 0.2 % (ref 0.0–2.0)
BASOS ABS: 0 10*3/uL (ref 0.0–0.1)
EOS%: 3.6 % (ref 0.0–7.0)
Eosinophils Absolute: 0.2 10*3/uL (ref 0.0–0.5)
HEMATOCRIT: 34.6 % — AB (ref 38.4–49.9)
HGB: 11.4 g/dL — ABNORMAL LOW (ref 13.0–17.1)
LYMPH%: 29.9 % (ref 14.0–49.0)
MCH: 34.4 pg — ABNORMAL HIGH (ref 27.2–33.4)
MCHC: 33 g/dL (ref 32.0–36.0)
MCV: 104.3 fL — ABNORMAL HIGH (ref 79.3–98.0)
MONO#: 0.6 10*3/uL (ref 0.1–0.9)
MONO%: 8.9 % (ref 0.0–14.0)
NEUT%: 57.4 % (ref 39.0–75.0)
NEUTROS ABS: 3.9 10*3/uL (ref 1.5–6.5)
Platelets: 144 10*3/uL (ref 140–400)
RBC: 3.32 10*6/uL — ABNORMAL LOW (ref 4.20–5.82)
RDW: 13.7 % (ref 11.0–14.6)
WBC: 6.8 10*3/uL (ref 4.0–10.3)
lymph#: 2 10*3/uL (ref 0.9–3.3)

## 2015-08-16 LAB — COMPREHENSIVE METABOLIC PANEL (CC13)
ALBUMIN: 3 g/dL — AB (ref 3.5–5.0)
ALK PHOS: 47 U/L (ref 40–150)
ALT: 11 U/L (ref 0–55)
AST: 15 U/L (ref 5–34)
Anion Gap: 6 mEq/L (ref 3–11)
BUN: 19.5 mg/dL (ref 7.0–26.0)
CALCIUM: 9 mg/dL (ref 8.4–10.4)
CO2: 27 mEq/L (ref 22–29)
CREATININE: 1 mg/dL (ref 0.7–1.3)
Chloride: 109 mEq/L (ref 98–109)
EGFR: 69 mL/min/{1.73_m2} — ABNORMAL LOW (ref >90)
Glucose: 86 mg/dl (ref 70–140)
Potassium: 4.7 mEq/L (ref 3.5–5.1)
Sodium: 142 mEq/L (ref 136–145)
TOTAL PROTEIN: 6.3 g/dL — AB (ref 6.4–8.3)
Total Bilirubin: 0.58 mg/dL (ref 0.20–1.20)

## 2015-08-16 NOTE — Telephone Encounter (Signed)
Pt confirmed labs/ov per 08/26 POF, gave pt AVS and Calendar... KJ

## 2015-08-16 NOTE — Progress Notes (Signed)
Hematology and Oncology Follow Up Visit  Hunter Hardin [id number] 16-May-1935 80 y.o. 08/16/2015 9:48 AM   Principle Diagnosis: 80 year old gentleman with:  1. T3 N0 colon cancer: He is status post a laparoscopic right hemicolectomy, with 0 out of 21 lymph nodes involved. That was done on 03/06/2013.  2. Renal cell carcinoma diagnosed in May of 2014. He presented with kidney mass and lung metastasis. 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  Current therapy: Started Votrient 800 mg daily on 05/22/2013. Dose was reduced to 600 mg daily on 01/19/14 due to weight loss and diarrhea. Then reduced to 400 mg starting on 09/07/2014. The dose will be reduced further to 200 mg daily starting on 11/16/2014. The dose reduction is related to diarrhea and weight loss.  Interim History: Hunter Hardin presents today for a follow up visit. Since the last visit, he reports no new complaints. He does report a small nodule right under his sternum that he palpates frequently. It's apiece size movable and painless nodule. Status associated with any GI symptoms at this time. Does not recall any trauma or recent intervention.  He does not report any nausea, vomiting or decline in his appetite. He continues to maintain his weight for the time being.  He is tolerating the dose reduction of Votrient to 200 mg very well.  He continues to able to work outside the house and attends to activities of daily living. His quality of life remains excellent.   He is not report any headaches, blurry vision or syncope. He denies chest pain, shortness of breath, dyspnea on exertion. He denies abdominal pain. He does not report any hematochezia. Performance status and activity level are getting closer to baseline. He has not reported any hospitalization or illnesses.He has not reported any pain in his hands or feet. Has not reported any fevers or chills or sweats.   He does not report any frequency urgency or hesitancy. Does not report any hematuria or dysuria. He does not report any pain especially in any skeletal structures. He denies any back pain or hip pain. He is not report any lymphadenopathy or petechiae.  Remainder of his review of systems unremarkable.   Medications: I have reviewed the patient's current medications. Reviewed today and unchanged. Current Outpatient Prescriptions  Medication Sig Dispense Refill  . doxazosin (CARDURA) 8 MG tablet TAKE 1 TABLET DAILY 90 tablet 3  . fish oil-omega-3 fatty acids 1000 MG capsule Take 1 g by mouth 2 (two) times daily.    . hydrochlorothiazide (HYDRODIURIL) 25 MG tablet Take 1 tablet (25 mg total) by mouth daily. 90 tablet 3  . lactose free nutrition (BOOST) LIQD Take 237 mLs by mouth daily.    [name] levothyroxine (SYNTHROID, LEVOTHROID) 50 MCG tablet TAKE 2 TABLETS ON SUNDAY AND TAKE 1 TABLET ON OTHER DAYS (TAKE 8 PER WEEK TOTAL) 105 tablet 3  . loperamide (IMODIUM A-D) 2 MG tablet Take 2 mg by mouth as needed for diarrhea or loose stools.    . ondansetron (ZOFRAN) 8 MG tablet TAKE ONE TABLET BY MOUTH EVERY 8 HOURS AS NEEDED FOR NAUSEA 20 tablet 2  . pazopanib (VOTRIENT) 200 MG tablet Take 1 tablet (200 mg total) by mouth daily. 30 tablet 0  . potassium chloride SA (KLOR-CON M20) 20 MEQ tablet Take 1 tablet (20 mEq total) by mouth 2 (two) times daily. 180 tablet 3  . propranolol (INDERAL) 40 MG tablet  Take 0.5 tablets (20 mg total) by mouth 2 (two) times daily. 90 tablet 3  . verapamil (CALAN-SR) 240 MG CR tablet Take 1 tablet (240 mg total) by mouth daily. 90 tablet 3  . vitamin C (ASCORBIC ACID) 500 MG tablet Take 500 mg by mouth daily.    [name] VOTRIENT 200 MG tablet TAKE 1 TABLET DAILY 90 tablet 0   No current facility-administered medications for this visit.    Allergies: No Known Allergies  Past Medical History, Surgical history, Social history, and Family History were reviewed and  updated.    Physical Exam: Blood pressure 111/81, pulse 56, temperature 98.1 F (36.7 C), temperature source Oral, resp. rate 18, height '5\' 6"'$  (1.676 m), weight 144 lb 8 oz (65.545 kg), SpO2 100 %. ECOG: 1 General appearance: alert awake gentleman chronically ill-appearing but without distress. Head: Normocephalic, without obvious abnormality Neck: no adenopathy, no masses or lesions. Lymph nodes: Cervical, supraclavicular, and axillary nodes normal. Heart:regular rate and rhythm, S1, S2. No murmurs rubs or gallops. Lung:chest clear, no wheezing, rales, no dullness to percussion. Chest wall examination: I was able to palpate a small nodule below the sternal notch. This was movable and nontender. No erythema or induration noted. Abdomen: soft, non-tender, without masses or organomegaly no rebound or guarding. EXT:1+ pitting edema, bilateral lower extremities.  Skin: No rashes or lesions noted today.   Lab Results: Lab Results  Component Value Date   WBC 6.8 08/16/2015   HGB 11.4* 08/16/2015   HCT 34.6* 08/16/2015   MCV 104.3* 08/16/2015   PLT 144 08/16/2015     CBC    Component Value Date/Time   WBC 6.8 08/16/2015 0912   WBC 5.7 04/02/2014 1416   RBC 3.32* 08/16/2015 0912   RBC 2.95* 04/02/2014 1416   HGB 11.4* 08/16/2015 0912   HGB 10.6* 04/02/2014 1416   HCT 34.6* 08/16/2015 0912   HCT 31.3* 04/02/2014 1416   PLT 144 08/16/2015 0912   PLT 126.0* 04/02/2014 1416   MCV 104.3* 08/16/2015 0912   MCV 106.3* 04/02/2014 1416   MCH 34.4* 08/16/2015 0912   MCH 27.2 05/03/2013 0900   MCHC 33.0 08/16/2015 0912   MCHC 33.8 04/02/2014 1416   RDW 13.7 08/16/2015 0912   RDW 15.4* 04/02/2014 1416   LYMPHSABS 2.0 08/16/2015 0912   LYMPHSABS 2.5 04/02/2014 1416   MONOABS 0.6 08/16/2015 0912   MONOABS 0.4 04/02/2014 1416   EOSABS 0.2 08/16/2015 0912   EOSABS 0.2 04/02/2014 1416   BASOSABS 0.0 08/16/2015 0912   BASOSABS 0.0 04/02/2014 1416   EXAM: CT CHEST, ABDOMEN, AND  PELVIS WITH CONTRAST  TECHNIQUE: Multidetector CT imaging of the chest, abdomen and pelvis was performed following the standard protocol during bolus administration of intravenous contrast.  CONTRAST: 13m OMNIPAQUE IOHEXOL 300 MG/ML SOLN  COMPARISON: 02/18/2015  FINDINGS: CT CHEST FINDINGS  Mediastinum/Nodes: Thyroid is normal. Great vessels are normal in caliber. Trace superior pericardial recess fluid. Moderate atheromatous aortic and coronary arterial calcifications without aneurysm.  Borderline cardiomegaly with left atrial prominence.  Stable 4 mm AP window lymph node image 42.  Stable 0.8 cm left hilar lymph node image 46.  Confluent right hilar lymph nodes measure 1.2 cm image 50, slightly smaller from re- measured 1.4 cm at similar anatomic location on the prior exam.  Sub carinal node now 0.7 cm image 53, previously 1.0 cm.  Right infrahilar node now 0.8 cm image 61, previously 1.2 cm.  Periesophageal node now 0.6 cm image 78 is larger than  previous measurement 0.3 cm.  Left epicardial node 1.3 cm image 78, previously 2 mm.  Lungs/Pleura: Dominant cavitary left lower lobe mass now measures 4.6 x 3.1 cm image 69, previously 4.1 x 2.9 cm.  Superior segment left lower lobe pulmonary nodule now 5 mm image 40, subjectively minimally increase in size but not numerically changed.  Left upper lobe pulmonary nodule now 0.5 cm image 26, likely not significantly changed since previous measurement 0.6 cm allowing for slice selection differences and small size.  Mild emphysematous changes are noted. Stable subpleural 1-2 mm scattered pulmonary parenchymal nodules. Right middle lobe granuloma.  Stable 0.5 cm right lower lobe pulmonary nodule image 75.  Trace pleural effusions.  Upper abdomen: Please see dedicated report below.  Musculoskeletal: No acute osseous abnormality or evidence for osseous metastatic disease in the chest.  CT  ABDOMEN AND PELVIS FINDINGS  Lower chest: Please see dedicated report above  Hepatobiliary: Stable degree of mild central intrahepatic ductal prominence, status post cholecystectomy. Common duct dilatation, maximal diameter 1.1 cm image 99, is stable, with tapering to the ampulla.  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  Spleen: Normal  Adrenals/Urinary Tract: Exophytic irregularly rim enhancing left upper pole mass with extension to the left renal hilum on image 110 now measures 5.3 x 4.4 cm, increased from 4.9 x 4.2 cm. Too small to characterize renal cortical lesions and right mid renal cortical cyst are otherwise stable. No hydroureteronephrosis. Adrenal glands are normal. No radiopaque renal or ureteral calculus. Impression upon the bladder base by enlarged prostate measuring 5.6 cm is noted.  Stomach/Bowel: Evidence of appendectomy. No bowel wall thickening or focal segmental dilatation is identified.  Vascular/Lymphatic: Severe concentric atheromatous aortic calcification noted without aneurysm.  Left external iliac chain lymph node is enlarged measuring 1 cm image 164, not seen previously. Aortic caval node measuring 5 mm image 110 is stable.  Other: No ascites or free air. Free fluid in the left pericolic gutter incidentally noted.  Musculoskeletal: No evidence for osseous metastatic disease. Probable right iliac bone island reidentified.  IMPRESSION: Although some mediastinal nodes are stable or decreased in size, the overall picture is compatible with progression of disease, with increase in size of dominant left lower lobe pulmonary parenchymal mass, primary left upper pole renal cell carcinoma, and development of new epicardial and left  external iliac chain lymphadenopathy.  Several lymph nodes demonstrate central apparent necrosis.   A 80 year old gentleman with the following issues.   1. Colon cancer: He is status post a laparoscopic right hemicolectomy, with the pathology revealing a T3 N0, with 0 out of 21 lymph nodes involved. He did not receive adjuvant chemotherapy for colon cancer given that he has stage IV kidney cancer.   2. Renal cell cancer: he has stage IV disease with lung involvment. He is currently on Votrient dose reduced to 200 mg daily.  CT scan from 06/2015  showed mostly stable disease. There is some slight increase in his cavitary lung lesion but no new lesions. Overall, I feel that his disease is relatively stable and has a reasonable quality of life on the current dose and schedule. He is to continue the same dose and schedule of Votrient and we'll repeat imaging studies in 3 months.  3. Weight loss: This have stabilized for the time being.  4. Diarrhea. Due  to prior hemicolectomy versus Votrient. Improved with the dose reduction of Votrient. I continue to educate him about anti-diarrheal medication to stay ahead of diarrhea symptoms.  5. Thrombocytopenia. Due to Votrient appears to have stabilized after the dose reduction. Platelet counts remained normal after the dose reduction.  6. Nausea: Controlled by Zofran.  7. Substernal nodule: Unclear etiology. His CT scan from 07/09/2015 was reviewed again and showed no abnormalities in his chest wall or sternum. We will continue to monitor this and gave him clear instructions to report to me any pain or discomfort. If he develops enlarging nodule in the future, we will repeat imaging studies sooner.  8. The transaminases surveillance: His liver function tests appeared within normal range on 07/09/2015.  9. Followup: Will be in one month.       [facility], MD 8/26/20169:48 AM

## 2015-09-19 ENCOUNTER — Ambulatory Visit (HOSPITAL_BASED_OUTPATIENT_CLINIC_OR_DEPARTMENT_OTHER): Payer: Medicare Other | Admitting: Oncology

## 2015-09-19 ENCOUNTER — Other Ambulatory Visit (HOSPITAL_BASED_OUTPATIENT_CLINIC_OR_DEPARTMENT_OTHER): Payer: Medicare Other

## 2015-09-19 ENCOUNTER — Telehealth: Payer: Self-pay | Admitting: Oncology

## 2015-09-19 VITALS — BP 143/66 | HR 60 | Temp 97.8°F | Resp 18 | Ht 66.0 in | Wt 150.9 lb

## 2015-09-19 DIAGNOSIS — C642 Malignant neoplasm of left kidney, except renal pelvis: Secondary | ICD-10-CM | POA: Diagnosis present

## 2015-09-19 DIAGNOSIS — C7802 Secondary malignant neoplasm of left lung: Secondary | ICD-10-CM | POA: Diagnosis not present

## 2015-09-19 DIAGNOSIS — C649 Malignant neoplasm of unspecified kidney, except renal pelvis: Secondary | ICD-10-CM

## 2015-09-19 DIAGNOSIS — C189 Malignant neoplasm of colon, unspecified: Secondary | ICD-10-CM

## 2015-09-19 LAB — CBC WITH DIFFERENTIAL/PLATELET
BASO%: 0.2 % (ref 0.0–2.0)
Basophils Absolute: 0 10e3/uL (ref 0.0–0.1)
EOS%: 4.3 % (ref 0.0–7.0)
Eosinophils Absolute: 0.3 10e3/uL (ref 0.0–0.5)
HCT: 34.2 % — ABNORMAL LOW (ref 38.4–49.9)
HGB: 11.5 g/dL — ABNORMAL LOW (ref 13.0–17.1)
LYMPH%: 31.4 % (ref 14.0–49.0)
MCH: 34.6 pg — ABNORMAL HIGH (ref 27.2–33.4)
MCHC: 33.6 g/dL (ref 32.0–36.0)
MCV: 103 fL — ABNORMAL HIGH (ref 79.3–98.0)
MONO#: 0.5 10e3/uL (ref 0.1–0.9)
MONO%: 7.2 % (ref 0.0–14.0)
NEUT#: 3.7 10e3/uL (ref 1.5–6.5)
NEUT%: 56.9 % (ref 39.0–75.0)
Platelets: 157 10e3/uL (ref 140–400)
RBC: 3.32 10e6/uL — ABNORMAL LOW (ref 4.20–5.82)
RDW: 15 % — ABNORMAL HIGH (ref 11.0–14.6)
WBC: 6.4 10e3/uL (ref 4.0–10.3)
lymph#: 2 10e3/uL (ref 0.9–3.3)

## 2015-09-19 LAB — COMPREHENSIVE METABOLIC PANEL (CC13)
ALT: 13 U/L (ref 0–55)
AST: 17 U/L (ref 5–34)
Albumin: 3.1 g/dL — ABNORMAL LOW (ref 3.5–5.0)
Alkaline Phosphatase: 46 U/L (ref 40–150)
Anion Gap: 7 mEq/L (ref 3–11)
BUN: 19.2 mg/dL (ref 7.0–26.0)
CO2: 25 mEq/L (ref 22–29)
Calcium: 9.2 mg/dL (ref 8.4–10.4)
Chloride: 109 mEq/L (ref 98–109)
Creatinine: 1 mg/dL (ref 0.7–1.3)
EGFR: 70 mL/min/{1.73_m2} — ABNORMAL LOW (ref >90)
GLUCOSE: 108 mg/dL (ref 70–140)
POTASSIUM: 4.5 meq/L (ref 3.5–5.1)
SODIUM: 141 meq/L (ref 136–145)
Total Bilirubin: 0.45 mg/dL (ref 0.20–1.20)
Total Protein: 6.5 g/dL (ref 6.4–8.3)

## 2015-09-19 NOTE — Progress Notes (Signed)
Hematology and Oncology Follow Up Visit  Hunter Hardin [id number] [birth year] 80 y.o. 09/19/2015 9:09 AM   Principle Diagnosis: 80 year old gentleman with:  1. T3 N0 colon cancer: He is status post a laparoscopic right hemicolectomy, with 0 out of 21 lymph nodes involved. That was done on 03/06/2013.  2. Renal cell carcinoma diagnosed in May of 2014. He presented with kidney mass and lung metastasis. 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  Current therapy: Started Votrient 800 mg daily on 05/22/2013. Dose was reduced to 600 mg daily on 01/19/14 due to weight loss and diarrhea. Then reduced to 400 mg starting on 09/07/2014. The dose will be reduced further to 200 mg daily starting on 11/16/2014. The dose reduction is related to diarrhea and weight loss.  Interim History: Mr. Hunter Hardin presents today for a follow up visit. Since the last visit, he continues to do very well. He has actually been eating better and gained close to 10 pounds since the last visit. He has reported overall his quality of life and performance status is excellent.   He is tolerating the dose reduction of Votrient to 200 mg very well.  He has no difficulties obtaining this medication and taken it on a daily basis. He denied worsening diarrhea, hand-foot syndrome, or skin rash. He has not reported any changes in his urine function or hematuria. He is able to drive himself to appointments without any decline.   He is not report any headaches, blurry vision or syncope. He denies chest pain, shortness of breath, dyspnea on exertion. He denies abdominal pain. He does not report any hematochezia. Performance status and activity level are getting closer to baseline. He has not reported any hospitalization or illnesses.He has not reported any pain in his hands or feet. Has not reported any fevers or chills or sweats.  He does not report any frequency urgency or  hesitancy. Does not report any hematuria or dysuria. He does not report any pain especially in any skeletal structures. He denies any back pain or hip pain. He is not report any lymphadenopathy or petechiae.  Remainder of his review of systems unremarkable.   Medications: I have reviewed the patient's current medications. Reviewed today and unchanged. Current Outpatient Prescriptions  Medication Sig Dispense Refill  . doxazosin (CARDURA) 8 MG tablet TAKE 1 TABLET DAILY 90 tablet 3  . fish oil-omega-3 fatty acids 1000 MG capsule Take 1 g by mouth 2 (two) times daily.    . hydrochlorothiazide (HYDRODIURIL) 25 MG tablet Take 1 tablet (25 mg total) by mouth daily. 90 tablet 3  . lactose free nutrition (BOOST) LIQD Take 237 mLs by mouth daily.    [name] levothyroxine (SYNTHROID, LEVOTHROID) 50 MCG tablet TAKE 2 TABLETS ON SUNDAY AND TAKE 1 TABLET ON OTHER DAYS (TAKE 8 PER WEEK TOTAL) 105 tablet 3  . loperamide (IMODIUM A-D) 2 MG tablet Take 2 mg by mouth as needed for diarrhea or loose stools.    . ondansetron (ZOFRAN) 8 MG tablet TAKE ONE TABLET BY MOUTH EVERY 8 HOURS AS NEEDED FOR NAUSEA 20 tablet 2  . pazopanib (VOTRIENT) 200 MG tablet Take 1 tablet (200 mg total) by mouth daily. 30 tablet 0  . potassium chloride SA (KLOR-CON M20) 20 MEQ tablet Take 1 tablet (20 mEq total) by mouth 2 (two) times daily. 180 tablet 3  . propranolol (INDERAL) 40 MG tablet Take 0.5 tablets (20 mg total) by  mouth 2 (two) times daily. 90 tablet 3  . verapamil (CALAN-SR) 240 MG CR tablet Take 1 tablet (240 mg total) by mouth daily. 90 tablet 3  . vitamin C (ASCORBIC ACID) 500 MG tablet Take 500 mg by mouth daily.    [name] VOTRIENT 200 MG tablet TAKE 1 TABLET DAILY 90 tablet 0   No current facility-administered medications for this visit.    Allergies: No Known Allergies  Past Medical History, Surgical history, Social history, and Family History were reviewed and updated.    Physical Exam: Blood pressure 143/66, pulse 60,  temperature 97.8 F (36.6 C), temperature source Oral, resp. rate 18, height '5\' 6"'$  (1.676 m), weight 150 lb 14.4 oz (68.448 kg), SpO2 98 %. ECOG: 1 General appearance: alert awake gentleman appeared in no distress. Head: Normocephalic, without obvious abnormality no oral ulcers or lesions. Neck: no adenopathy, no masses or lesions. Lymph nodes: Cervical, supraclavicular, and axillary nodes normal. Heart:regular rate and rhythm, S1, S2. No murmurs rubs or gallops. Lung:chest clear, no wheezing, rales, no dullness to percussion. Chest wall examination: No masses or lesion or indurations. Abdomen: soft, non-tender, without masses or organomegaly no rebound or guarding. EXT:1+ pitting edema, bilateral lower extremities. Unchanged from previous examination. Skin: No rashes or lesions noted today.   Lab Results: Lab Results  Component Value Date   WBC 6.4 09/19/2015   HGB 11.5* 09/19/2015   HCT 34.2* 09/19/2015   MCV 103.0* 09/19/2015   PLT 157 09/19/2015     CBC    Component Value Date/Time   WBC 6.4 09/19/2015 0845   WBC 5.7 04/02/2014 1416   RBC 3.32* 09/19/2015 0845   RBC 2.95* 04/02/2014 1416   HGB 11.5* 09/19/2015 0845   HGB 10.6* 04/02/2014 1416   HCT 34.2* 09/19/2015 0845   HCT 31.3* 04/02/2014 1416   PLT 157 09/19/2015 0845   PLT 126.0* 04/02/2014 1416   MCV 103.0* 09/19/2015 0845   MCV 106.3* 04/02/2014 1416   MCH 34.6* 09/19/2015 0845   MCH 27.2 05/03/2013 0900   MCHC 33.6 09/19/2015 0845   MCHC 33.8 04/02/2014 1416   RDW 15.0* 09/19/2015 0845   RDW 15.4* 04/02/2014 1416   LYMPHSABS 2.0 09/19/2015 0845   LYMPHSABS 2.5 04/02/2014 1416   MONOABS 0.5 09/19/2015 0845   MONOABS 0.4 04/02/2014 1416   EOSABS 0.3 09/19/2015 0845   EOSABS 0.2 04/02/2014 1416   BASOSABS 0.0 09/19/2015 0845   BASOSABS 0.0 04/02/2014 5871    A 80 year old gentleman with the following issues.   1. Colon cancer: He is status post a laparoscopic right hemicolectomy, with the pathology  revealing a T3 N0, with 0 out of 21 lymph nodes involved. He did not receive adjuvant chemotherapy for colon cancer given that he has stage IV kidney cancer.  He is currently on active surveillance and has no signs or symptoms of cancer recurrence. Routine imaging studies for kidney cancer will provide screening for any cancer recurrence from the colon.  2. Renal cell cancer: he has stage IV disease with lung involvment. He is currently on Votrient dose reduced to 200 mg daily.  CT scan from 06/2015  showed mostly stable disease. There is some slight increase in his cavitary lung lesion but no new lesions. He does have excellent quality of life on the current medication with the current dose. I plan on keeping him on the same regimen and repeat imaging studies in 2 months. He develops progression of disease at that time, we will consider different  salvage therapy.  3. Weight loss: This have resolved at this time and have gained close 10 pounds since the last visit.  4. Diarrhea. Due to prior hemicolectomy versus Votrient. Improved with the dose reduction of Votrient. I continue to educate him about anti-diarrheal medication to stay ahead of diarrhea symptoms. This was emphasized again today and appears to be under control.  5. Thrombocytopenia. Due to Votrient appears to have stabilized after the dose reduction. Platelet counts remained normal after the dose reduction.  6. Nausea: Controlled by Zofran. Very limited at this time with the dose reduction.  7. Substernal nodule: Less of an issue at this time. We will continue to monitor specially with repeat imaging studies in the near future.  8. The transaminases surveillance: His liver function tests appeared within normal range in August 2016.  9. Followup: Will be in one month.       [facility], MD 9/29/20169:09 AM

## 2015-09-19 NOTE — Addendum Note (Signed)
Addended by: Randolm Idol on: 09/19/2015 09:28 AM   Modules accepted: Medications

## 2015-09-19 NOTE — Telephone Encounter (Signed)
per pof to sch pt appt-gave pt copy of avs °

## 2015-10-09 DIAGNOSIS — I10 Essential (primary) hypertension: Secondary | ICD-10-CM | POA: Diagnosis not present

## 2015-10-09 DIAGNOSIS — H35033 Hypertensive retinopathy, bilateral: Secondary | ICD-10-CM | POA: Diagnosis not present

## 2015-10-09 DIAGNOSIS — H5203 Hypermetropia, bilateral: Secondary | ICD-10-CM | POA: Diagnosis not present

## 2015-10-23 ENCOUNTER — Ambulatory Visit (HOSPITAL_BASED_OUTPATIENT_CLINIC_OR_DEPARTMENT_OTHER): Payer: Medicare Other | Admitting: Oncology

## 2015-10-23 ENCOUNTER — Telehealth: Payer: Self-pay | Admitting: Oncology

## 2015-10-23 ENCOUNTER — Other Ambulatory Visit (HOSPITAL_BASED_OUTPATIENT_CLINIC_OR_DEPARTMENT_OTHER): Payer: Medicare Other

## 2015-10-23 VITALS — BP 166/71 | HR 53 | Temp 98.1°F | Resp 17 | Ht 66.0 in | Wt 145.9 lb

## 2015-10-23 DIAGNOSIS — C649 Malignant neoplasm of unspecified kidney, except renal pelvis: Secondary | ICD-10-CM

## 2015-10-23 DIAGNOSIS — C7800 Secondary malignant neoplasm of unspecified lung: Secondary | ICD-10-CM | POA: Diagnosis not present

## 2015-10-23 DIAGNOSIS — D6959 Other secondary thrombocytopenia: Secondary | ICD-10-CM

## 2015-10-23 DIAGNOSIS — Z85038 Personal history of other malignant neoplasm of large intestine: Secondary | ICD-10-CM

## 2015-10-23 DIAGNOSIS — C189 Malignant neoplasm of colon, unspecified: Secondary | ICD-10-CM

## 2015-10-23 DIAGNOSIS — R918 Other nonspecific abnormal finding of lung field: Secondary | ICD-10-CM

## 2015-10-23 DIAGNOSIS — R197 Diarrhea, unspecified: Secondary | ICD-10-CM | POA: Diagnosis not present

## 2015-10-23 DIAGNOSIS — R110 Nausea: Secondary | ICD-10-CM | POA: Diagnosis not present

## 2015-10-23 LAB — COMPREHENSIVE METABOLIC PANEL (CC13)
ALT: 12 U/L (ref 0–55)
ANION GAP: 5 meq/L (ref 3–11)
AST: 15 U/L (ref 5–34)
Albumin: 2.9 g/dL — ABNORMAL LOW (ref 3.5–5.0)
Alkaline Phosphatase: 49 U/L (ref 40–150)
BUN: 25.6 mg/dL (ref 7.0–26.0)
CHLORIDE: 108 meq/L (ref 98–109)
CO2: 29 meq/L (ref 22–29)
CREATININE: 1.1 mg/dL (ref 0.7–1.3)
Calcium: 9.5 mg/dL (ref 8.4–10.4)
EGFR: 62 mL/min/{1.73_m2} — ABNORMAL LOW (ref >90)
Glucose: 88 mg/dl (ref 70–140)
Potassium: 4.5 mEq/L (ref 3.5–5.1)
Sodium: 142 mEq/L (ref 136–145)
TOTAL PROTEIN: 6.5 g/dL (ref 6.4–8.3)
Total Bilirubin: 0.38 mg/dL (ref 0.20–1.20)

## 2015-10-23 LAB — CBC WITH DIFFERENTIAL/PLATELET
BASO%: 0.2 % (ref 0.0–2.0)
Basophils Absolute: 0 10*3/uL (ref 0.0–0.1)
EOS%: 5 % (ref 0.0–7.0)
Eosinophils Absolute: 0.3 10*3/uL (ref 0.0–0.5)
HCT: 36.3 % — ABNORMAL LOW (ref 38.4–49.9)
HGB: 11.7 g/dL — ABNORMAL LOW (ref 13.0–17.1)
LYMPH%: 36.4 % (ref 14.0–49.0)
MCH: 33.5 pg — ABNORMAL HIGH (ref 27.2–33.4)
MCHC: 32.2 g/dL (ref 32.0–36.0)
MCV: 104 fL — ABNORMAL HIGH (ref 79.3–98.0)
MONO#: 0.5 10*3/uL (ref 0.1–0.9)
MONO%: 8.5 % (ref 0.0–14.0)
NEUT#: 2.9 10*3/uL (ref 1.5–6.5)
NEUT%: 49.9 % (ref 39.0–75.0)
PLATELETS: 123 10*3/uL — AB (ref 140–400)
RBC: 3.49 10*6/uL — ABNORMAL LOW (ref 4.20–5.82)
RDW: 14.9 % — ABNORMAL HIGH (ref 11.0–14.6)
WBC: 5.9 10*3/uL (ref 4.0–10.3)
lymph#: 2.1 10*3/uL (ref 0.9–3.3)

## 2015-10-23 NOTE — Telephone Encounter (Signed)
Gave patient avs report and appointments for December. [facility] will call with ct - patient aware.

## 2015-10-23 NOTE — Progress Notes (Signed)
Hematology and Oncology Follow Up Visit  Hunter Hardin [id number] 11-02-1935 80 y.o. 10/23/2015 3:03 PM   Principle Diagnosis: 80 year old gentleman with:  1. T3 N0 colon cancer: He is status post a laparoscopic right hemicolectomy, with 0 out of 21 lymph nodes involved. That was done on 03/06/2013.  2. Renal cell carcinoma diagnosed in May of 2014. He presented with kidney mass and lung metastasis. 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  Current therapy: Started Votrient 800 mg daily on 05/22/2013. Dose was reduced to 600 mg daily on 01/19/14 due to weight loss and diarrhea. Then reduced to 400 mg starting on 09/07/2014. The dose will be reduced further to 200 mg daily starting on 11/16/2014. The dose reduction is related to diarrhea and weight loss.  Interim History: Hunter Hardin presents today for a follow up visit. Since the last visit, he reports no complaints.He is tolerating the dose reduction of Votrient to 200 mg very well.  He has no difficulties obtaining this medication and taken it on a daily basis. He denied worsening diarrhea, hand-foot syndrome, or skin rash. He has not reported any changes in his urine function or hematuria. His appetite remains excellent although his weight did drop from last visit, it has improved overall in the last 6 months. His performance status and quality of life remains excellent.   He is not report any headaches, blurry vision or syncope. He denies chest pain, shortness of breath, dyspnea on exertion. He denies abdominal pain. He does not report any hematochezia. Performance status and activity level are getting closer to baseline. He has not reported any hospitalization or illnesses.He has not reported any pain in his hands or feet. Has not reported any fevers or chills or sweats.  He does not report any frequency urgency or hesitancy. Does not report any hematuria or dysuria. He does not  report any pain especially in any skeletal structures. He denies any back pain or hip pain. He is not report any lymphadenopathy or petechiae.  Remainder of his review of systems unremarkable.   Medications: I have reviewed the patient's current medications. Reviewed today and unchanged. Current Outpatient Prescriptions  Medication Sig Dispense Refill  . doxazosin (CARDURA) 8 MG tablet TAKE 1 TABLET DAILY 90 tablet 3  . fish oil-omega-3 fatty acids 1000 MG capsule Take 1 g by mouth 2 (two) times daily.    . hydrochlorothiazide (HYDRODIURIL) 25 MG tablet Take 1 tablet (25 mg total) by mouth daily. 90 tablet 3  . lactose free nutrition (BOOST) LIQD Take 237 mLs by mouth daily.    [name] levothyroxine (SYNTHROID, LEVOTHROID) 50 MCG tablet TAKE 2 TABLETS ON SUNDAY AND TAKE 1 TABLET ON OTHER DAYS (TAKE 8 PER WEEK TOTAL) 105 tablet 3  . loperamide (IMODIUM A-D) 2 MG tablet Take 2 mg by mouth as needed for diarrhea or loose stools.    . ondansetron (ZOFRAN) 8 MG tablet TAKE ONE TABLET BY MOUTH EVERY 8 HOURS AS NEEDED FOR NAUSEA 20 tablet 2  . pazopanib (VOTRIENT) 200 MG tablet Take 1 tablet (200 mg total) by mouth daily. 30 tablet 0  . potassium chloride SA (KLOR-CON M20) 20 MEQ tablet Take 1 tablet (20 mEq total) by mouth 2 (two) times daily. 180 tablet 3  . propranolol (INDERAL) 40 MG tablet Take 0.5 tablets (20 mg total) by mouth 2 (two) times daily. 90 tablet 3  . verapamil (CALAN-SR) 240 MG CR  tablet Take 1 tablet (240 mg total) by mouth daily. 90 tablet 3  . vitamin C (ASCORBIC ACID) 500 MG tablet Take 500 mg by mouth daily.    [name] VOTRIENT 200 MG tablet TAKE 1 TABLET DAILY 90 tablet 0   No current facility-administered medications for this visit.    Allergies: No Known Allergies  Past Medical History, Surgical history, Social history, and Family History were reviewed and updated.    Physical Exam: Blood pressure 166/71, pulse 53, temperature 98.1 F (36.7 C), temperature source Oral, resp.  rate 17, height '5\' 6"'$  (1.676 m), weight 145 lb 14.4 oz (66.18 kg), SpO2 99 %. ECOG: 1 General appearance: alert awake gentleman chronically ill-appearing without distress. Head: Normocephalic, without obvious abnormality no thrush noted. Neck: no adenopathy, no masses or lesions. Lymph nodes: Cervical, supraclavicular, and axillary nodes normal. Heart:regular rate and rhythm, S1, S2. No murmurs rubs or gallops. Lung:chest clear, no wheezing, rales, no dullness to percussion. Chest wall examination: No masses or lesion or indurations. Abdomen: soft, non-tender, without masses or organomegaly no rebound or guarding. EXT:1+ pitting edema at the ankles bilaterally. Skin: No rashes or lesions noted today.   Lab Results: Lab Results  Component Value Date   WBC 5.9 10/23/2015   HGB 11.7* 10/23/2015   HCT 36.3* 10/23/2015   MCV 104.0* 10/23/2015   PLT 123* 10/23/2015     CBC    Component Value Date/Time   WBC 5.9 10/23/2015 1439   WBC 5.7 04/02/2014 1416   RBC 3.49* 10/23/2015 1439   RBC 2.95* 04/02/2014 1416   HGB 11.7* 10/23/2015 1439   HGB 10.6* 04/02/2014 1416   HCT 36.3* 10/23/2015 1439   HCT 31.3* 04/02/2014 1416   PLT 123* 10/23/2015 1439   PLT 126.0* 04/02/2014 1416   MCV 104.0* 10/23/2015 1439   MCV 106.3* 04/02/2014 1416   MCH 33.5* 10/23/2015 1439   MCH 27.2 05/03/2013 0900   MCHC 32.2 10/23/2015 1439   MCHC 33.8 04/02/2014 1416   RDW 14.9* 10/23/2015 1439   RDW 15.4* 04/02/2014 1416   LYMPHSABS 2.1 10/23/2015 1439   LYMPHSABS 2.5 04/02/2014 1416   MONOABS 0.5 10/23/2015 1439   MONOABS 0.4 04/02/2014 1416   EOSABS 0.3 10/23/2015 1439   EOSABS 0.2 04/02/2014 1416   BASOSABS 0.0 10/23/2015 1439   BASOSABS 0.0 04/02/2014 2516    A 80 year old gentleman with the following issues.   1. Colon cancer: He is status post a laparoscopic right hemicolectomy, with the pathology revealing a T3 N0, with 0 out of 21 lymph nodes involved. He did not receive adjuvant  chemotherapy for colon cancer given that he has stage IV kidney cancer.  He is currently on active surveillance and has no signs or symptoms of cancer recurrence.  2. Renal cell cancer: he has stage IV disease with lung involvment. He is currently on Votrient dose reduced to 200 mg daily.  CT scan from 06/2015  showed mostly stable disease. He continues to tolerate the current dose of Votrient without any major complications. The plan is to repeat his CT scan with the next visit. If his disease remains stable, no dose reductions or delays are needed. If he has progressed at that time, different salvage therapy will be needed.  3. Weight loss: This have resolved and overall have gained close to 5 pounds last 6 months. We'll continue to monitor this moving forward.  4. Diarrhea. Due to prior hemicolectomy versus Votrient. I continue to educate him about anti-diarrheal medication to  stay ahead of diarrhea symptoms. He has done much better since the dose reduction.  5. Thrombocytopenia. Due to Votrient appears to have stabilized after the dose reduction. Platelet counts remained normal after the dose reduction.  6. Nausea: Controlled by Zofran. Very limited at this time with the dose reduction.  7. Substernal nodule: Less of an issue at this time. No signs symptoms of bleeding.  8. The transaminases surveillance: His liver function tests appeared within normal range in August 2016.  9. Followup: Will be in one month.       Zola Button, MD 11/2/20163:03 PM

## 2015-11-07 DIAGNOSIS — L578 Other skin changes due to chronic exposure to nonionizing radiation: Secondary | ICD-10-CM | POA: Diagnosis not present

## 2015-11-07 DIAGNOSIS — L570 Actinic keratosis: Secondary | ICD-10-CM | POA: Diagnosis not present

## 2015-11-07 DIAGNOSIS — L821 Other seborrheic keratosis: Secondary | ICD-10-CM | POA: Diagnosis not present

## 2015-11-07 DIAGNOSIS — D692 Other nonthrombocytopenic purpura: Secondary | ICD-10-CM | POA: Diagnosis not present

## 2015-11-07 DIAGNOSIS — D1800 Hemangioma unspecified site: Secondary | ICD-10-CM | POA: Diagnosis not present

## 2015-11-07 DIAGNOSIS — Z1283 Encounter for screening for malignant neoplasm of skin: Secondary | ICD-10-CM | POA: Diagnosis not present

## 2015-11-07 DIAGNOSIS — L812 Freckles: Secondary | ICD-10-CM | POA: Diagnosis not present

## 2015-11-09 DIAGNOSIS — Z0000 Encounter for general adult medical examination without abnormal findings: Secondary | ICD-10-CM | POA: Diagnosis not present

## 2015-11-22 ENCOUNTER — Other Ambulatory Visit (HOSPITAL_BASED_OUTPATIENT_CLINIC_OR_DEPARTMENT_OTHER): Payer: Medicare Other

## 2015-11-22 ENCOUNTER — Other Ambulatory Visit: Payer: Self-pay | Admitting: Oncology

## 2015-11-22 ENCOUNTER — Ambulatory Visit (HOSPITAL_COMMUNITY)
Admission: RE | Admit: 2015-11-22 | Discharge: 2015-11-22 | Disposition: A | Discharge status: Home / Self Care | Payer: Medicare Other | Source: Ambulatory Visit | Attending: Oncology | Admitting: Oncology

## 2015-11-22 DIAGNOSIS — C189 Malignant neoplasm of colon, unspecified: Secondary | ICD-10-CM

## 2015-11-22 DIAGNOSIS — C7802 Secondary malignant neoplasm of left lung: Secondary | ICD-10-CM | POA: Diagnosis not present

## 2015-11-22 DIAGNOSIS — C7801 Secondary malignant neoplasm of right lung: Secondary | ICD-10-CM | POA: Insufficient documentation

## 2015-11-22 DIAGNOSIS — R918 Other nonspecific abnormal finding of lung field: Secondary | ICD-10-CM

## 2015-11-22 DIAGNOSIS — C649 Malignant neoplasm of unspecified kidney, except renal pelvis: Secondary | ICD-10-CM

## 2015-11-22 DIAGNOSIS — C7951 Secondary malignant neoplasm of bone: Secondary | ICD-10-CM | POA: Diagnosis not present

## 2015-11-22 DIAGNOSIS — J910 Malignant pleural effusion: Secondary | ICD-10-CM | POA: Insufficient documentation

## 2015-11-22 DIAGNOSIS — Z9049 Acquired absence of other specified parts of digestive tract: Secondary | ICD-10-CM | POA: Diagnosis not present

## 2015-11-22 DIAGNOSIS — C7800 Secondary malignant neoplasm of unspecified lung: Secondary | ICD-10-CM | POA: Diagnosis not present

## 2015-11-22 DIAGNOSIS — C642 Malignant neoplasm of left kidney, except renal pelvis: Secondary | ICD-10-CM | POA: Diagnosis not present

## 2015-11-22 LAB — COMPREHENSIVE METABOLIC PANEL
ALBUMIN: 3.1 g/dL — AB (ref 3.5–5.0)
ALK PHOS: 52 U/L (ref 40–150)
ALT: 13 U/L (ref 0–55)
AST: 18 U/L (ref 5–34)
Anion Gap: 9 mEq/L (ref 3–11)
BUN: 24.7 mg/dL (ref 7.0–26.0)
CO2: 25 mEq/L (ref 22–29)
Calcium: 9.5 mg/dL (ref 8.4–10.4)
Chloride: 106 mEq/L (ref 98–109)
Creatinine: 1.1 mg/dL (ref 0.7–1.3)
EGFR: 62 mL/min/{1.73_m2} — ABNORMAL LOW (ref >90)
Glucose: 82 mg/dl (ref 70–140)
Potassium: 4.9 mEq/L (ref 3.5–5.1)
Sodium: 140 mEq/L (ref 136–145)
TOTAL PROTEIN: 6.8 g/dL (ref 6.4–8.3)
Total Bilirubin: 0.49 mg/dL (ref 0.20–1.20)

## 2015-11-22 LAB — CBC WITH DIFFERENTIAL/PLATELET
BASO%: 0.2 % (ref 0.0–2.0)
BASOS ABS: 0 10*3/uL (ref 0.0–0.1)
EOS%: 5 % (ref 0.0–7.0)
Eosinophils Absolute: 0.3 10*3/uL (ref 0.0–0.5)
HEMATOCRIT: 37 % — AB (ref 38.4–49.9)
HEMOGLOBIN: 12.2 g/dL — AB (ref 13.0–17.1)
LYMPH#: 2.2 10*3/uL (ref 0.9–3.3)
LYMPH%: 37.1 % (ref 14.0–49.0)
MCH: 33.5 pg — AB (ref 27.2–33.4)
MCHC: 32.9 g/dL (ref 32.0–36.0)
MCV: 101.7 fL — ABNORMAL HIGH (ref 79.3–98.0)
MONO#: 0.5 10*3/uL (ref 0.1–0.9)
MONO%: 8.5 % (ref 0.0–14.0)
NEUT%: 49.2 % (ref 39.0–75.0)
NEUTROS ABS: 3 10*3/uL (ref 1.5–6.5)
Platelets: 150 10*3/uL (ref 140–400)
RBC: 3.64 10*6/uL — ABNORMAL LOW (ref 4.20–5.82)
RDW: 15.6 % — AB (ref 11.0–14.6)
WBC: 6.1 10*3/uL (ref 4.0–10.3)

## 2015-11-22 MED ORDER — IOHEXOL 300 MG/ML  SOLN
100.0000 mL | Freq: Once | INTRAMUSCULAR | Status: AC | PRN
  Administered 2015-11-22: 100 mL via INTRAVENOUS

## 2015-11-26 ENCOUNTER — Telehealth: Payer: Self-pay | Admitting: Oncology

## 2015-11-26 ENCOUNTER — Ambulatory Visit (HOSPITAL_BASED_OUTPATIENT_CLINIC_OR_DEPARTMENT_OTHER): Payer: Medicare Other | Admitting: Oncology

## 2015-11-26 ENCOUNTER — Other Ambulatory Visit: Payer: Self-pay | Admitting: *Deleted

## 2015-11-26 VITALS — BP 150/63 | HR 54 | Temp 97.5°F | Resp 18 | Ht 66.0 in | Wt 149.7 lb

## 2015-11-26 DIAGNOSIS — R634 Abnormal weight loss: Secondary | ICD-10-CM | POA: Diagnosis not present

## 2015-11-26 DIAGNOSIS — R197 Diarrhea, unspecified: Secondary | ICD-10-CM

## 2015-11-26 DIAGNOSIS — J90 Pleural effusion, not elsewhere classified: Secondary | ICD-10-CM | POA: Diagnosis not present

## 2015-11-26 DIAGNOSIS — D6959 Other secondary thrombocytopenia: Secondary | ICD-10-CM

## 2015-11-26 DIAGNOSIS — C7800 Secondary malignant neoplasm of unspecified lung: Secondary | ICD-10-CM

## 2015-11-26 DIAGNOSIS — C649 Malignant neoplasm of unspecified kidney, except renal pelvis: Secondary | ICD-10-CM | POA: Diagnosis not present

## 2015-11-26 DIAGNOSIS — Z85038 Personal history of other malignant neoplasm of large intestine: Secondary | ICD-10-CM

## 2015-11-26 MED ORDER — CABOZANTINIB S-MALATE 40 MG PO TABS: 40.0000 mg | ORAL_TABLET | Freq: Every day | ORAL | Status: DC

## 2015-11-26 NOTE — Telephone Encounter (Signed)
Gave and pritned appt sched and avs for pt for Jan 2017

## 2015-11-26 NOTE — Progress Notes (Signed)
Hematology and Oncology Follow Up Visit  Hunter Hardin [id number] May 24, 1935 80 y.o. 11/26/2015 2:49 PM   Principle Diagnosis: 80 year old gentleman with:  1. T3 N0 colon cancer: He is status post a laparoscopic right hemicolectomy, with 0 out of 21 lymph nodes involved. That was done on 03/06/2013.  2. Renal cell carcinoma diagnosed in May of 2014. He presented with kidney mass and lung metastasis. 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  Prior therapy: Started Votrient 800 mg daily on 05/22/2013. Dose was reduced to 600 mg daily on 01/19/14 due to weight loss and diarrhea. Then reduced to 400 mg starting on 09/07/2014. The dose will be reduced further to 200 mg daily starting on 11/16/2014. The dose reduction is related to diarrhea and weight loss. Therapy discontinued in December 2016 for disease progression.  Current therapy: Cabometyx 40 mg daily started in December 2016.  Interim History: Hunter Hardin presents today for a follow up visit. Since the last visit, he continues to do very well without any major complaints. He denied any diarrhea or decline in energy or performance status. He has tolerated Votrient at a lower dose without any new complaints. His appetite remains excellent and his weight is stable.  He has not reported any pulmonary symptoms including dyspnea on exertion or shortness of breath. Has not reported any cough or hemoptysis.    He is not report any headaches, blurry vision or syncope. He denies chest pain or dyspnea on exertion. He denies abdominal pain. He does not report any hematochezia. He has not reported any pain in his hands or feet. Has not reported any fevers or chills or sweats.  He does not report any frequency urgency or hesitancy. Does not report any hematuria or dysuria. He does not report any pain especially in any skeletal structures. He denies any back pain or hip pain. He is not  report any lymphadenopathy or petechiae.  Remainder of his review of systems unremarkable.   Medications: I have reviewed the patient's current medications. Reviewed today and unchanged. Current Outpatient Prescriptions  Medication Sig Dispense Refill  . doxazosin (CARDURA) 8 MG tablet TAKE 1 TABLET DAILY 90 tablet 3  . fish oil-omega-3 fatty acids 1000 MG capsule Take 1 g by mouth 2 (two) times daily.    . hydrochlorothiazide (HYDRODIURIL) 25 MG tablet Take 1 tablet (25 mg total) by mouth daily. 90 tablet 3  . lactose free nutrition (BOOST) LIQD Take 237 mLs by mouth daily.    [name] levothyroxine (SYNTHROID, LEVOTHROID) 50 MCG tablet TAKE 2 TABLETS ON SUNDAY AND TAKE 1 TABLET ON OTHER DAYS (TAKE 8 PER WEEK TOTAL) 105 tablet 3  . loperamide (IMODIUM A-D) 2 MG tablet Take 2 mg by mouth as needed for diarrhea or loose stools.    . ondansetron (ZOFRAN) 8 MG tablet TAKE ONE TABLET BY MOUTH EVERY 8 HOURS AS NEEDED FOR NAUSEA 20 tablet 2  . potassium chloride SA (KLOR-CON M20) 20 MEQ tablet Take 1 tablet (20 mEq total) by mouth 2 (two) times daily. 180 tablet 3  . propranolol (INDERAL) 40 MG tablet Take 0.5 tablets (20 mg total) by mouth 2 (two) times daily. 90 tablet 3  . verapamil (CALAN-SR) 240 MG CR tablet Take 1 tablet (240 mg total) by mouth daily. 90 tablet 3  . vitamin C (ASCORBIC ACID) 500 MG tablet Take 500 mg by mouth daily.    . Cabozantinib S-Malate (CABOMETYX)  40 MG TABS Take 40 mg by mouth daily. 39 tablet 0   No current facility-administered medications for this visit.    Allergies: No Known Allergies  Past Medical History, Surgical history, Social history, and Family History were reviewed and updated.    Physical Exam: Blood pressure 150/63, pulse 54, temperature 97.5 F (36.4 C), temperature source Oral, resp. rate 18, height '5\' 6"'$  (1.676 m), weight 149 lb 11.2 oz (67.903 kg), SpO2 99 %. ECOG: 1 General appearance: alert awake gentleman appeared without distress. Head:  Normocephalic, without obvious abnormality no thrush noted. Neck: no adenopathy, no masses or lesions. Lymph nodes: Cervical, supraclavicular, and axillary nodes normal. Heart:regular rate and rhythm, S1, S2. No murmurs rubs or gallops. Lung:chest clear, no wheezing, rales, no dullness to percussion. Chest wall examination: No masses or lesion or indurations. Abdomen: soft, non-tender, without masses or organomegaly no shifting dullness or ascites. EXT: Trace edema noted bilaterally. Skin: No rashes or lesions noted today.   Lab Results: Lab Results  Component Value Date   WBC 6.1 11/22/2015   HGB 12.2* 11/22/2015   HCT 37.0* 11/22/2015   MCV 101.7* 11/22/2015   PLT 150 11/22/2015     CBC    Component Value Date/Time   WBC 6.1 11/22/2015 0837   WBC 5.7 04/02/2014 1416   RBC 3.64* 11/22/2015 0837   RBC 2.95* 04/02/2014 1416   HGB 12.2* 11/22/2015 0837   HGB 10.6* 04/02/2014 1416   HCT 37.0* 11/22/2015 0837   HCT 31.3* 04/02/2014 1416   PLT 150 11/22/2015 0837   PLT 126.0* 04/02/2014 1416   MCV 101.7* 11/22/2015 0837   MCV 106.3* 04/02/2014 1416   MCH 33.5* 11/22/2015 0837   MCH 27.2 05/03/2013 0900   MCHC 32.9 11/22/2015 0837   MCHC 33.8 04/02/2014 1416   RDW 15.6* 11/22/2015 0837   RDW 15.4* 04/02/2014 1416   LYMPHSABS 2.2 11/22/2015 0837   LYMPHSABS 2.5 04/02/2014 1416   MONOABS 0.5 11/22/2015 0837   MONOABS 0.4 04/02/2014 1416   EOSABS 0.3 11/22/2015 0837   EOSABS 0.2 04/02/2014 1416   BASOSABS 0.0 11/22/2015 0837   BASOSABS 0.0 04/02/2014 1416   EXAM: CT CHEST WITH CONTRAST  CT ABDOMEN AND PELVIS WITHOUT AND WITH CONTRAST  TECHNIQUE: Multidetector CT imaging of the abdomen and pelvis was performed following the standard protocol before and during bolus administration of intravenous contrast. Multidetector CT imaging of the chest was performed following the standard protocol during bolus administration of intravenous contrast.  CONTRAST: 126m  OMNIPAQUE IOHEXOL 300 MG/ML SOLN  COMPARISON: 07/09/2015  FINDINGS: CT CHEST FINDINGS  Mediastinum/Nodes: Heart is top-normal in size. No pericardial effusion.  Coronary atherosclerosis.  Atherosclerotic calcifications of the aortic arch and aortic root.  Progression of thoracic lymphadenopathy, including:  --6 mm short axis node along the left aspect of the descending thoracic aorta (series 6/image 36), new  --7 mm short axis subcarinal node (series 6/image 53), unchanged  --14 mm short axis left hilar node (series 6/image 7), previously 7 mm  --3.1 cm short axis left epicardial node (series 6/image 82), previously 1.3 cm  Visualized thyroid is unremarkable.  Lungs/Pleura: 3.1 x 5.0 cm left lower lobe solid enhancing mass (series 8/image 71), previously a 3.1 x 4.6 cm mixed cystic/ solid (partially cavitary) lesion, increased.  1.6 cm irregular nodular opacity in the left upper lobe/ lingula (series 8/image 36), new.  Scattered subcentimeter left lung nodules (series 8/ images 29, 44, 49, 51, 56), similar versus mildly progressed. Additional subcentimeter nodules  along the posterior right lower lobe measuring up to 9 mm (series 8/ images 64 and 71), mildly increased.  Small right and moderate left pleural effusions, increased, with enhancing pleural-based nodularity on the left (series 6/ image 60).  No pneumothorax.  Musculoskeletal: Degenerative changes of the thoracic spine.  CT ABDOMEN PELVIS FINDINGS  Hepatobiliary: Liver is notable for mild intrahepatic ductal prominence but is otherwise within normal limits. No suspicious/enhancing hepatic lesions.  Status post cholecystectomy. No extrahepatic ductal dilatation.  Pancreas: Within normal limits.  Spleen: Within normal limits.  Adrenals/Urinary Tract: Adrenal glands are within normal limits.  5.9 x 5.1 cm enhancing mass along the lateral aspect of the left upper kidney  (series 6/ image 111), previously 5.3 x 4.4 cm, corresponding to known primary renal neoplasm.  16 mm cyst in the posterior right lower kidney (series 6/ image 117). No hydronephrosis.  Bladder is within normal limits.  Stomach/Bowel: Stomach is notable for a tiny hiatal hernia.  Status post partial right hemicolectomy with suture line in the right mid abdomen.  No evidence of bowel obstruction.  Vascular/Lymphatic: Atherosclerotic calcifications of the abdominal aorta and branch vessels. No evidence abdominal aortic aneurysm.  8 mm short axis left external iliac node (series 6/ image 168), previously 10 mm.  Reproductive: Prostatomegaly, with enlargement of the central gland which indents the base of the bladder.  Other: Trace pelvic fluid/ ascites.  Small fat containing left inguinal hernia (series 6/image 187).  Musculoskeletal: Degenerative changes of the lumbar spine.  Left sacral Tarlov cyst (series 6/image 161).  IMPRESSION: 5.9 cm left upper pole renal mass, corresponding to known primary renal neoplasm, increased.  Dominant 5.0 cm left lower lobe pulmonary metastasis. Additional bilateral pulmonary metastases with increasing pleural effusions, likely malignant on the left.  Progression of thoracic nodal metastases with a dominant 3.0 cm short axis left epicardial metastasis, increased.  Status post right hemicolectomy.  Additional ancillary findings as above.       A 80 year old gentleman with the following issues.   1. Colon cancer: He is status post a laparoscopic right hemicolectomy, with the pathology revealing a T3 N0, with 0 out of 21 lymph nodes involved. He did not receive adjuvant chemotherapy for colon cancer given that he has stage IV kidney cancer.  He is currently on active surveillance and has no signs or symptoms of cancer recurrence.  2. Renal cell cancer: he has stage IV disease with lung involvment. He is currently  on Votrient dose reduced to 200 mg daily.  CT scan from 11/22/2015 was reviewed today and showed clear progression of disease. He has increased pulmonary nodules, pleural effusion and his primary tumor appeared also have increased in size.  Options of therapy were reviewed today which include Inlyta, Cabometyx or Nivolumab. Risks and benefits of these options were reviewed and it would be reasonable to consider Cabometyx given the high response rate associated with this medication. Side effects were reviewed today which includes fatigue, tiredness, hand-foot syndrome, diarrhea as well as the generalized class effect associated with this medication. He is agreeable to proceed and we'll start at 40 mg and potentially titrated up if he versus medication well.   3. Weight loss: This have improved as of late but I continue to educate him about the possibility of that occurring again on this new medication.  4. Diarrhea. Due to prior hemicolectomy versus Votrient. I continue to educate him about anti-diarrheal medication to stay ahead of diarrhea symptoms. This could be an issue associated  with this new medication.  5. Thrombocytopenia. Due to Votrient appears to have stabilized after the dose reduction. Platelet counts remained normal at this time.  6. Nausea: Controlled by Zofran. This medication is available to him if needed to.  7. Pleural effusion: Henrene Pastor small and asymptomatic. We'll continue to monitor this and potentially arrange for thoracentesis if he becomes symptomatic.  8. The transaminases surveillance: His liver function tests appeared within normal range in December 2016.  9. Followup: Will be in one month.       Zola Button, MD 12/6/20162:49 PM

## 2015-12-09 ENCOUNTER — Encounter: Payer: Self-pay | Admitting: *Deleted

## 2015-12-09 ENCOUNTER — Other Ambulatory Visit: Payer: Self-pay | Admitting: *Deleted

## 2015-12-09 MED ORDER — CABOZANTINIB S-MALATE 40 MG PO TABS: 40.0000 mg | ORAL_TABLET | Freq: Every day | ORAL | Status: DC

## 2015-12-17 ENCOUNTER — Telehealth: Payer: Self-pay | Admitting: *Deleted

## 2015-12-17 NOTE — Telephone Encounter (Signed)
Patient called in stating that [facility] on file could not fill his cabomytx due to supply. [facility] pharmacy does have this drug supply. Phone number: [phone]. Please refill. Message sent to [facility]

## 2015-12-19 ENCOUNTER — Encounter: Payer: Self-pay | Admitting: *Deleted

## 2015-12-24 ENCOUNTER — Telehealth: Payer: Self-pay | Admitting: *Deleted

## 2015-12-24 NOTE — Telephone Encounter (Signed)
Returned patient's phone call to home and cell. Lm on both

## 2015-12-25 ENCOUNTER — Telehealth: Payer: Self-pay | Admitting: Pharmacist

## 2015-12-25 NOTE — Telephone Encounter (Signed)
12/25/15: Called patient to follow up on Cabometyx. Patient is receiving Cabometyx from [facility] (associated with [facility]). Unable to reach patient. Left VM on both home phone and cell phone for patient to call back with any questions or issues.   Thank you,  Montel Clock, PharmD, [facility]

## 2015-12-27 ENCOUNTER — Ambulatory Visit (HOSPITAL_BASED_OUTPATIENT_CLINIC_OR_DEPARTMENT_OTHER): Payer: Medicare Other | Admitting: Oncology

## 2015-12-27 ENCOUNTER — Telehealth: Payer: Self-pay | Admitting: Oncology

## 2015-12-27 ENCOUNTER — Other Ambulatory Visit (HOSPITAL_BASED_OUTPATIENT_CLINIC_OR_DEPARTMENT_OTHER): Payer: Medicare Other

## 2015-12-27 VITALS — BP 148/65 | HR 50 | Temp 97.6°F | Resp 18 | Ht 66.0 in | Wt 144.9 lb

## 2015-12-27 DIAGNOSIS — J90 Pleural effusion, not elsewhere classified: Secondary | ICD-10-CM | POA: Diagnosis not present

## 2015-12-27 DIAGNOSIS — C7800 Secondary malignant neoplasm of unspecified lung: Secondary | ICD-10-CM

## 2015-12-27 DIAGNOSIS — C189 Malignant neoplasm of colon, unspecified: Secondary | ICD-10-CM

## 2015-12-27 DIAGNOSIS — C649 Malignant neoplasm of unspecified kidney, except renal pelvis: Secondary | ICD-10-CM

## 2015-12-27 DIAGNOSIS — Z85038 Personal history of other malignant neoplasm of large intestine: Secondary | ICD-10-CM | POA: Diagnosis not present

## 2015-12-27 DIAGNOSIS — D696 Thrombocytopenia, unspecified: Secondary | ICD-10-CM | POA: Diagnosis not present

## 2015-12-27 LAB — CBC WITH DIFFERENTIAL/PLATELET
BASO%: 0.1 % (ref 0.0–2.0)
Basophils Absolute: 0 10*3/uL (ref 0.0–0.1)
EOS%: 4.1 % (ref 0.0–7.0)
Eosinophils Absolute: 0.3 10*3/uL (ref 0.0–0.5)
HCT: 40 % (ref 38.4–49.9)
HGB: 13.2 g/dL (ref 13.0–17.1)
LYMPH#: 2.6 10*3/uL (ref 0.9–3.3)
LYMPH%: 35.4 % (ref 14.0–49.0)
MCH: 33.2 pg (ref 27.2–33.4)
MCHC: 33 g/dL (ref 32.0–36.0)
MCV: 100.5 fL — ABNORMAL HIGH (ref 79.3–98.0)
MONO#: 0.6 10*3/uL (ref 0.1–0.9)
MONO%: 7.6 % (ref 0.0–14.0)
NEUT%: 52.8 % (ref 39.0–75.0)
NEUTROS ABS: 3.8 10*3/uL (ref 1.5–6.5)
Platelets: 123 10*3/uL — ABNORMAL LOW (ref 140–400)
RBC: 3.98 10*6/uL — AB (ref 4.20–5.82)
RDW: 15.3 % — AB (ref 11.0–14.6)
WBC: 7.3 10*3/uL (ref 4.0–10.3)

## 2015-12-27 LAB — COMPREHENSIVE METABOLIC PANEL
ALT: 35 U/L (ref 0–55)
AST: 29 U/L (ref 5–34)
Albumin: 3.1 g/dL — ABNORMAL LOW (ref 3.5–5.0)
Alkaline Phosphatase: 69 U/L (ref 40–150)
Anion Gap: 8 mEq/L (ref 3–11)
BILIRUBIN TOTAL: 0.4 mg/dL (ref 0.20–1.20)
BUN: 25.9 mg/dL (ref 7.0–26.0)
CHLORIDE: 109 meq/L (ref 98–109)
CO2: 21 mEq/L — ABNORMAL LOW (ref 22–29)
CREATININE: 1.1 mg/dL (ref 0.7–1.3)
Calcium: 8.8 mg/dL (ref 8.4–10.4)
EGFR: 67 mL/min/{1.73_m2} — ABNORMAL LOW (ref >90)
GLUCOSE: 84 mg/dL (ref 70–140)
Potassium: 4.6 mEq/L (ref 3.5–5.1)
SODIUM: 137 meq/L (ref 136–145)
Total Protein: 6.9 g/dL (ref 6.4–8.3)

## 2015-12-27 NOTE — Telephone Encounter (Signed)
Gave patient avs report and appointments for February.  °

## 2015-12-27 NOTE — Progress Notes (Signed)
Hematology and Oncology Follow Up Visit  Hunter Hardin [id number] Jan 13, 1935 81 y.o. 12/27/2015 8:52 AM   Principle Diagnosis: 81 year old gentleman with:  1. T3 N0 colon cancer: He is status post a laparoscopic right hemicolectomy, with 0 out of 21 lymph nodes involved. That was done on 03/06/2013.  2. Renal cell carcinoma diagnosed in May of 2014. He presented with kidney mass and lung metastasis. 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  Prior therapy: Started Votrient 800 mg daily on 05/22/2013. Dose was reduced to 600 mg daily on 01/19/14 due to weight loss and diarrhea. Then reduced to 400 mg starting on 09/07/2014. The dose will be reduced further to 200 mg daily starting on 11/16/2014. The dose reduction is related to diarrhea and weight loss. Therapy discontinued in December 2016 for disease progression.  Current therapy: Cabometyx 40 mg daily started in December 2016.  Interim History: Hunter Hardin presents today for a follow up visit. Since the last visit, he started Cabometyx and have tolerated it reasonably well. He reported symptoms of fatigue and mild diarrhea that have been manageable at this time. He reports continued to the bathroom 2-3 times and have used Imodium about once a day. Otherwise reports no other toxicities. Has not reported any hand-foot syndrome. Has not reported any dermatological issues. Continues to perform activities of daily living without any decline.  He has not reported any pulmonary symptoms including dyspnea on exertion or shortness of breath. Has not reported any cough or hemoptysis. He does not report any exertional dyspnea or decline in his function status. His appetite remains reasonable and his weight is stable.    He is not report any headaches, blurry vision or syncope. He denies chest pain or dyspnea on exertion. He denies abdominal pain. He does not report any hematochezia. He has  not reported any pain in his hands or feet. Has not reported any fevers or chills or sweats.  He does not report any frequency urgency or hesitancy. Does not report any hematuria or dysuria. He does not report any pain especially in any skeletal structures. He denies any back pain or hip pain. He is not report any lymphadenopathy or petechiae.  Remainder of his review of systems unremarkable.   Medications: I have reviewed the patient's current medications. Reviewed today and unchanged. Current Outpatient Prescriptions  Medication Sig Dispense Refill  . Cabozantinib S-Malate (CABOMETYX) 40 MG TABS Take 40 mg by mouth daily. 90 tablet 0  . doxazosin (CARDURA) 8 MG tablet TAKE 1 TABLET DAILY 90 tablet 3  . fish oil-omega-3 fatty acids 1000 MG capsule Take 1 g by mouth 2 (two) times daily.    . hydrochlorothiazide (HYDRODIURIL) 25 MG tablet Take 1 tablet (25 mg total) by mouth daily. 90 tablet 3  . lactose free nutrition (BOOST) LIQD Take 237 mLs by mouth daily.    [name] levothyroxine (SYNTHROID, LEVOTHROID) 50 MCG tablet TAKE 2 TABLETS ON SUNDAY AND TAKE 1 TABLET ON OTHER DAYS (TAKE 8 PER WEEK TOTAL) 105 tablet 3  . loperamide (IMODIUM A-D) 2 MG tablet Take 2 mg by mouth as needed for diarrhea or loose stools.    . ondansetron (ZOFRAN) 8 MG tablet TAKE ONE TABLET BY MOUTH EVERY 8 HOURS AS NEEDED FOR NAUSEA 20 tablet 2  . potassium chloride SA (KLOR-CON M20) 20 MEQ tablet Take 1 tablet (20 mEq total) by mouth 2 (two) times daily. 180 tablet 3  .  propranolol (INDERAL) 40 MG tablet Take 0.5 tablets (20 mg total) by mouth 2 (two) times daily. 90 tablet 3  . verapamil (CALAN-SR) 240 MG CR tablet Take 1 tablet (240 mg total) by mouth daily. 90 tablet 3  . vitamin C (ASCORBIC ACID) 500 MG tablet Take 500 mg by mouth daily.     No current facility-administered medications for this visit.    Allergies: No Known Allergies  Past Medical History, Surgical history, Social history, and Family History were  reviewed and updated.    Physical Exam: Blood pressure 148/65, pulse 50, temperature 97.6 F (36.4 C), temperature source Oral, resp. rate 18, height '5\' 6"'$  (1.676 m), weight 144 lb 14.4 oz (65.726 kg), SpO2 99 %. ECOG: 1 General appearance: alert awake gentleman appeared without distress. Head: Normocephalic, without obvious abnormality no thrush noted. Neck: no adenopathy, no masses or lesions. Lymph nodes: Cervical, supraclavicular, and axillary nodes normal. Heart:regular rate and rhythm, S1, S2. No murmurs rubs or gallops. Lung:chest clear, no wheezing, rales, no dullness to percussion. Chest wall examination: No masses or lesion or indurations. Abdomen: soft, non-tender, without masses or organomegaly no rebound or guarding. EXT: Trace edema noted bilaterally. Skin: No rashes or lesions noted today. No erythema on the palms or soles.   Lab Results: Lab Results  Component Value Date   WBC 7.3 12/27/2015   HGB 13.2 12/27/2015   HCT 40.0 12/27/2015   MCV 100.5* 12/27/2015   PLT 123* 12/27/2015     CBC    Component Value Date/Time   WBC 7.3 12/27/2015 0813   WBC 5.7 04/02/2014 1416   RBC 3.98* 12/27/2015 0813   RBC 2.95* 04/02/2014 1416   HGB 13.2 12/27/2015 0813   HGB 10.6* 04/02/2014 1416   HCT 40.0 12/27/2015 0813   HCT 31.3* 04/02/2014 1416   PLT 123* 12/27/2015 0813   PLT 126.0* 04/02/2014 1416   MCV 100.5* 12/27/2015 0813   MCV 106.3* 04/02/2014 1416   MCH 33.2 12/27/2015 0813   MCH 27.2 05/03/2013 0900   MCHC 33.0 12/27/2015 0813   MCHC 33.8 04/02/2014 1416   RDW 15.3* 12/27/2015 0813   RDW 15.4* 04/02/2014 1416   LYMPHSABS 2.6 12/27/2015 0813   LYMPHSABS 2.5 04/02/2014 1416   MONOABS 0.6 12/27/2015 0813   MONOABS 0.4 04/02/2014 1416   EOSABS 0.3 12/27/2015 0813   EOSABS 0.2 04/02/2014 1416   BASOSABS 0.0 12/27/2015 0813   BASOSABS 0.0 04/02/2014 1416         A 81 year old gentleman with the following issues.   1. Colon cancer: He is status  post a laparoscopic right hemicolectomy, with the pathology revealing a T3 N0, with 0 out of 21 lymph nodes involved. He did not receive adjuvant chemotherapy for colon cancer given that he has stage IV kidney cancer.  He is currently on active surveillance and has no signs or symptoms of cancer recurrence.  2. Renal cell cancer: he has stage IV disease with lung involvment. He is currently on Votrient dose reduced to 200 mg daily.  CT scan from 11/22/2015 showed clear progression of disease. He has been on Cabometyx for the last month and have tolerated it reasonably well. He does have grade 1 fatigue and grade 1 diarrhea that are manageable. The plan is to continue with same dose schedule and repeat imaging studies in 2 months. If he develops further complications related to this medication, we will reduce his dose further at that time.   3. Weight loss: His weight is  stable at this time I will continue to monitor moving forward.  4. Diarrhea. Due to prior hemicolectomy versus Votrient. I continue to educate him about anti-diarrheal medication to stay ahead of diarrhea symptoms. I instructed him to take Imodium after each loose bowel movement and we'll consider Lomotil if that is not effective.  5. Thrombocytopenia. Appears to be stable without any further bleeding.  6. Nausea: Controlled by Zofran. This medication is available to him if needed to. No nausea she reported.  7. Pleural effusion: small and asymptomatic. We'll continue to monitor this and potentially arrange for thoracentesis if he becomes symptomatic.  8. The transaminases surveillance: His liver function tests appeared within normal range in December 2016. These will be repeated monthly.  9. Followup: Will be in one month.       [facility], MD 1/6/20178:52 AM

## 2015-12-29 ENCOUNTER — Other Ambulatory Visit: Payer: Self-pay | Admitting: Oncology

## 2015-12-31 ENCOUNTER — Encounter: Payer: Self-pay | Admitting: Pharmacist

## 2015-12-31 NOTE — Progress Notes (Signed)
Oral Chemotherapy Follow-Up Form  Original Start date of oral chemotherapy: _12/14/16_   Called patient today to follow up regarding patient's oral chemotherapy medication: _Cabometyx___  Pt is doing well today. No complaints or issues. He has been on the Cabometyx for about 1 month with no issues or side effects to report. No missed doses. He has about 4 days remaining. I have called [facility] pharmacy and they will contact patient today to schedule delivery of his next refill.   Pt reports _0_ tablets/doses missed in the last week/month.    Will follow up and call patient again in __3 weeks___   Thank you,  Montel Clock, PharmD, [facility]

## 2016-01-07 ENCOUNTER — Other Ambulatory Visit: Payer: Self-pay | Admitting: *Deleted

## 2016-01-07 MED ORDER — CABOZANTINIB S-MALATE 40 MG PO TABS: 40.0000 mg | ORAL_TABLET | Freq: Every day | ORAL | Status: DC

## 2016-01-09 DIAGNOSIS — N400 Enlarged prostate without lower urinary tract symptoms: Secondary | ICD-10-CM | POA: Diagnosis not present

## 2016-01-09 DIAGNOSIS — C642 Malignant neoplasm of left kidney, except renal pelvis: Secondary | ICD-10-CM | POA: Diagnosis not present

## 2016-01-09 DIAGNOSIS — C7800 Secondary malignant neoplasm of unspecified lung: Secondary | ICD-10-CM | POA: Diagnosis not present

## 2016-01-09 DIAGNOSIS — Z0000 Encounter for general adult medical examination without abnormal findings: Secondary | ICD-10-CM | POA: Diagnosis not present

## 2016-01-10 ENCOUNTER — Encounter: Payer: Self-pay | Admitting: *Deleted

## 2016-01-22 ENCOUNTER — Telehealth: Payer: Self-pay | Admitting: Pharmacist

## 2016-01-22 NOTE — Telephone Encounter (Signed)
01/22/16: Attempted to reach patient for follow up on oral medication: Cabometyx. No answer. Left VM for patient to call back with any questions or issues.   Thank you,  Montel Clock, PharmD, [facility] [phone]

## 2016-01-29 ENCOUNTER — Telehealth: Payer: Self-pay | Admitting: Oncology

## 2016-01-29 ENCOUNTER — Other Ambulatory Visit (HOSPITAL_BASED_OUTPATIENT_CLINIC_OR_DEPARTMENT_OTHER): Payer: Medicare Other

## 2016-01-29 ENCOUNTER — Ambulatory Visit (HOSPITAL_BASED_OUTPATIENT_CLINIC_OR_DEPARTMENT_OTHER): Payer: Medicare Other | Admitting: Oncology

## 2016-01-29 VITALS — BP 128/67 | HR 62 | Temp 97.6°F | Resp 18 | Ht 66.0 in | Wt 135.0 lb

## 2016-01-29 DIAGNOSIS — R197 Diarrhea, unspecified: Secondary | ICD-10-CM

## 2016-01-29 DIAGNOSIS — C189 Malignant neoplasm of colon, unspecified: Secondary | ICD-10-CM

## 2016-01-29 DIAGNOSIS — R634 Abnormal weight loss: Secondary | ICD-10-CM

## 2016-01-29 DIAGNOSIS — D696 Thrombocytopenia, unspecified: Secondary | ICD-10-CM | POA: Diagnosis not present

## 2016-01-29 DIAGNOSIS — C7800 Secondary malignant neoplasm of unspecified lung: Secondary | ICD-10-CM | POA: Diagnosis not present

## 2016-01-29 DIAGNOSIS — J90 Pleural effusion, not elsewhere classified: Secondary | ICD-10-CM

## 2016-01-29 DIAGNOSIS — Z85038 Personal history of other malignant neoplasm of large intestine: Secondary | ICD-10-CM

## 2016-01-29 DIAGNOSIS — C649 Malignant neoplasm of unspecified kidney, except renal pelvis: Secondary | ICD-10-CM

## 2016-01-29 DIAGNOSIS — R110 Nausea: Secondary | ICD-10-CM

## 2016-01-29 LAB — CBC WITH DIFFERENTIAL/PLATELET
BASO%: 0.2 % (ref 0.0–2.0)
Basophils Absolute: 0 10*3/uL (ref 0.0–0.1)
EOS%: 5.4 % (ref 0.0–7.0)
Eosinophils Absolute: 0.4 10*3/uL (ref 0.0–0.5)
HCT: 37.6 % — ABNORMAL LOW (ref 38.4–49.9)
HEMOGLOBIN: 13.2 g/dL (ref 13.0–17.1)
LYMPH#: 2.8 10*3/uL (ref 0.9–3.3)
LYMPH%: 43.3 % (ref 14.0–49.0)
MCH: 33.2 pg (ref 27.2–33.4)
MCHC: 35.1 g/dL (ref 32.0–36.0)
MCV: 94.7 fL (ref 79.3–98.0)
MONO#: 0.5 10*3/uL (ref 0.1–0.9)
MONO%: 7.3 % (ref 0.0–14.0)
NEUT%: 43.8 % (ref 39.0–75.0)
NEUTROS ABS: 2.8 10*3/uL (ref 1.5–6.5)
Platelets: 130 10*3/uL — ABNORMAL LOW (ref 140–400)
RBC: 3.97 10*6/uL — ABNORMAL LOW (ref 4.20–5.82)
RDW: 15.5 % — AB (ref 11.0–14.6)
WBC: 6.4 10*3/uL (ref 4.0–10.3)

## 2016-01-29 LAB — COMPREHENSIVE METABOLIC PANEL
ALT: 32 U/L (ref 0–55)
AST: 28 U/L (ref 5–34)
Albumin: 2.6 g/dL — ABNORMAL LOW (ref 3.5–5.0)
Alkaline Phosphatase: 107 U/L (ref 40–150)
Anion Gap: 9 mEq/L (ref 3–11)
BUN: 29.2 mg/dL — AB (ref 7.0–26.0)
CHLORIDE: 105 meq/L (ref 98–109)
CO2: 22 mEq/L (ref 22–29)
Calcium: 8.4 mg/dL (ref 8.4–10.4)
Creatinine: 1.4 mg/dL — ABNORMAL HIGH (ref 0.7–1.3)
EGFR: 49 mL/min/{1.73_m2} — AB (ref >90)
GLUCOSE: 106 mg/dL (ref 70–140)
POTASSIUM: 3.8 meq/L (ref 3.5–5.1)
SODIUM: 136 meq/L (ref 136–145)
Total Bilirubin: 0.63 mg/dL (ref 0.20–1.20)
Total Protein: 6.7 g/dL (ref 6.4–8.3)

## 2016-01-29 NOTE — Progress Notes (Signed)
Hematology and Oncology Follow Up Visit  Hunter Hardin [id number] 22-Oct-1935 81 y.o. 01/29/2016 12:04 PM   Principle Diagnosis: 81 year old gentleman with:  1. T3 N0 colon cancer: He is status post a laparoscopic right hemicolectomy, with 0 out of 21 lymph nodes involved. That was done on 03/06/2013.  2. Renal cell carcinoma diagnosed in May of 2014. He presented with kidney mass and lung metastasis. 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  Prior therapy: Started Votrient 800 mg daily on 05/22/2013. Dose was reduced to 600 mg daily on 01/19/14 due to weight loss and diarrhea. Then reduced to 400 mg starting on 09/07/2014. The dose will be reduced further to 200 mg daily starting on 11/16/2014. The dose reduction is related to diarrhea and weight loss. Therapy discontinued in December 2016 for disease progression.  Current therapy: Cabometyx 40 mg daily started in December 2016.  Interim History: Hunter Hardin presents today for a follow up visit by himself. Since the last visit, he continues on Cabometyx with some side effects. He did report an episode of nausea and vomiting that lasted for 4 days. It is unclear if it's related to this medication versus a viral illness. He lost his appetite and close to 10 pounds of weight. He is feeling much better in the last week. He have regained all activities of daily living and his nausea, vomiting and diarrhea have subsided. He had not been using Imodium which have helped his symptoms. He does have Zofran but used it to relate when he was throwing up. At this time, he is able to eat and drink and maintain adequate nutrition.  He has not reported any pulmonary symptoms including dyspnea on exertion or shortness of breath. Has not reported any cough or hemoptysis. He does not report any exertional dyspnea or decline in his function status. His appetite remains reasonable and his weight is  stable.    He is not report any headaches, blurry vision or syncope. He denies chest pain or dyspnea on exertion. He denies abdominal pain. He does not report any hematochezia. He has not reported any pain in his hands or feet. Has not reported any fevers or chills or sweats.  He does not report any frequency urgency or hesitancy. Does not report any hematuria or dysuria. He does not report any pain especially in any skeletal structures. He denies any back pain or hip pain. He is not report any lymphadenopathy or petechiae.  Remainder of his review of systems unremarkable.   Medications: I have reviewed the patient's current medications. Reviewed today and unchanged. Current Outpatient Prescriptions  Medication Sig Dispense Refill  . Cabozantinib S-Malate (CABOMETYX) 40 MG TABS Take 40 mg by mouth daily. 90 tablet 0  . doxazosin (CARDURA) 8 MG tablet TAKE 1 TABLET DAILY 90 tablet 3  . finasteride (PROSCAR) 5 MG tablet Take 5 mg by mouth daily.    . fish oil-omega-3 fatty acids 1000 MG capsule Take 1 g by mouth 2 (two) times daily.    . hydrochlorothiazide (HYDRODIURIL) 25 MG tablet Take 1 tablet (25 mg total) by mouth daily. 90 tablet 3  . lactose free nutrition (BOOST) LIQD Take 237 mLs by mouth daily.    [name] levothyroxine (SYNTHROID, LEVOTHROID) 50 MCG tablet TAKE 2 TABLETS ON SUNDAY AND TAKE 1 TABLET ON OTHER DAYS (TAKE 8 PER WEEK TOTAL) 105 tablet 3  . loperamide (IMODIUM A-D) 2 MG tablet Take  2 mg by mouth as needed for diarrhea or loose stools.    . ondansetron (ZOFRAN) 8 MG tablet TAKE ONE TABLET BY MOUTH EVERY 8 HOURS AS NEEDED FOR NAUSEA 20 tablet 2  . potassium chloride SA (KLOR-CON M20) 20 MEQ tablet Take 1 tablet (20 mEq total) by mouth 2 (two) times daily. 180 tablet 3  . propranolol (INDERAL) 40 MG tablet Take 0.5 tablets (20 mg total) by mouth 2 (two) times daily. 90 tablet 3  . verapamil (CALAN-SR) 240 MG CR tablet Take 1 tablet (240 mg total) by mouth daily. 90 tablet 3  . vitamin  C (ASCORBIC ACID) 500 MG tablet Take 500 mg by mouth daily.     No current facility-administered medications for this visit.    Allergies: No Known Allergies  Past Medical History, Surgical history, Social history, and Family History were reviewed and updated.    Physical Exam: Blood pressure 128/67, pulse 62, temperature 97.6 F (36.4 C), temperature source Oral, resp. rate 18, height '5\' 6"'$  (1.676 m), weight 135 lb (61.236 kg), SpO2 98 %. ECOG: 1 General appearance: alert awake gentleman without distress. Head: Normocephalic, without obvious abnormality no ulcers or lesions. Neck: no adenopathy, no masses or lesions. Lymph nodes: Cervical, supraclavicular, and axillary nodes normal. Heart:regular rate and rhythm, S1, S2. No murmurs rubs or gallops. Lung:chest clear, no wheezing, rales, no dullness to percussion. Chest wall examination: No tenderness or masses noted. Abdomen: soft, non-tender, without masses or organomegaly no rebound or guarding. EXT: Trace edema noted bilaterally. Skin:  erythema on the palms or soles without desquamation.   Lab Results: Lab Results  Component Value Date   WBC 6.4 01/29/2016   HGB 13.2 01/29/2016   HCT 37.6* 01/29/2016   MCV 94.7 01/29/2016   PLT 130* 01/29/2016     CBC    Component Value Date/Time   WBC 6.4 01/29/2016 1114   WBC 5.7 04/02/2014 1416   RBC 3.97* 01/29/2016 1114   RBC 2.95* 04/02/2014 1416   HGB 13.2 01/29/2016 1114   HGB 10.6* 04/02/2014 1416   HCT 37.6* 01/29/2016 1114   HCT 31.3* 04/02/2014 1416   PLT 130* 01/29/2016 1114   PLT 126.0* 04/02/2014 1416   MCV 94.7 01/29/2016 1114   MCV 106.3* 04/02/2014 1416   MCH 33.2 01/29/2016 1114   MCH 27.2 05/03/2013 0900   MCHC 35.1 01/29/2016 1114   MCHC 33.8 04/02/2014 1416   RDW 15.5* 01/29/2016 1114   RDW 15.4* 04/02/2014 1416   LYMPHSABS 2.8 01/29/2016 1114   LYMPHSABS 2.5 04/02/2014 1416   MONOABS 0.5 01/29/2016 1114   MONOABS 0.4 04/02/2014 1416   EOSABS 0.4  01/29/2016 1114   EOSABS 0.2 04/02/2014 1416   BASOSABS 0.0 01/29/2016 1114   BASOSABS 0.0 04/02/2014 1416         A 81 year old gentleman with the following issues.   1. Colon cancer: He is status post a laparoscopic right hemicolectomy, with the pathology revealing a T3 N0, with 0 out of 21 lymph nodes involved. He did not receive adjuvant chemotherapy for colon cancer given that he has stage IV kidney cancer.  He is currently on active surveillance and has no signs or symptoms of cancer recurrence.  2. Renal cell cancer: he has stage IV disease with lung involvment. He is currently on Votrient dose reduced to 200 mg daily.  CT scan from 11/22/2015 showed clear progression of disease. He has been on Cabometyx since December 2017. He did have a few complications related  to it but appears to have subsided at this time. The plan is to continue with the same dose and schedule and consider dose reduction if the symptoms recur. I plan on repeat CT scan in 2 months.   3. Weight loss: His weight is improving since his nausea and vomiting has subsided.  4. Diarrhea. Seems to be manageable at this time with the help of Imodium.  5. Thrombocytopenia. Laboratory data reviewed today and showed his platelet count to be stable without bleeding.  6. Nausea: Controlled by Zofran. Instructed him to use this medication earlier if he develops any future symptoms.  7. Pleural effusion: small and asymptomatic. We'll continue to monitor this and potentially arrange for thoracentesis if he becomes symptomatic.  8. The transaminases surveillance: All within normal range from 01/29/2016.  9. Followup: Will be in one month.       [facility], MD 2/8/201712:04 PM

## 2016-01-29 NOTE — Telephone Encounter (Signed)
per pf to sch pt appt-gave pt copy of avs °

## 2016-01-30 ENCOUNTER — Encounter: Payer: Self-pay | Admitting: Pharmacist

## 2016-01-30 NOTE — Progress Notes (Signed)
Oral Chemotherapy Follow-Up Form  Original Start date of oral chemotherapy: _12/14/16_  Called patient today to follow up regarding patient's oral chemotherapy medication: _Cabometyx___  Pt is doing well today. No complaints or issues. He has been on the Cabometyx for about 2 month with no issues or side effects to report. No missed doses. He saw Dr. Alen Blew yesterday. He has had issues with a few days of nausea and vomiting. Possibly related to cabometyx or a viral process. He is much better now and able to eat regularly (appetite and energy levels normal)  Pt reports _0_ tablets/doses missed in the last week/month.    Will follow up and call patient again in __4  weeks___   Thank you,  Montel Clock, PharmD, [facility]

## 2016-02-10 DIAGNOSIS — Z85038 Personal history of other malignant neoplasm of large intestine: Secondary | ICD-10-CM | POA: Diagnosis not present

## 2016-02-10 DIAGNOSIS — C182 Malignant neoplasm of ascending colon: Secondary | ICD-10-CM | POA: Diagnosis not present

## 2016-02-26 ENCOUNTER — Other Ambulatory Visit: Payer: Self-pay | Admitting: Family Medicine

## 2016-02-26 MED ORDER — ONDANSETRON HCL 8 MG PO TABS: 8.0000 mg | ORAL_TABLET | Freq: Three times a day (TID) | ORAL | Status: DC | PRN

## 2016-02-26 NOTE — Telephone Encounter (Signed)
Last refill 05/23/15 #20/2 Last office visit 06/05/15 Is it okay to refill?

## 2016-02-26 NOTE — Telephone Encounter (Signed)
Sent. Thanks.   

## 2016-02-26 NOTE — Addendum Note (Signed)
Addended by: Tonia Ghent on: 02/26/2016 10:26 PM   Modules accepted: Orders

## 2016-02-26 NOTE — Telephone Encounter (Signed)
Pt walked in today needing to get a refill on  20 ondansetron '8mg'$  tab  ([id number]) Pt has 1 pill left [facility]

## 2016-02-26 NOTE — Addendum Note (Signed)
Addended by: Emelia Salisbury C on: 02/26/2016 02:25 PM   Modules accepted: Orders

## 2016-02-27 ENCOUNTER — Encounter: Payer: Self-pay | Admitting: Pharmacist

## 2016-02-27 NOTE — Progress Notes (Signed)
Oral Chemotherapy Follow-Up Form  Original Start date of oral chemotherapy: _12/14/16_  Called patient today to follow up regarding patient's oral chemotherapy medication: _Cabometyx___  Pt is doing well today. No complaints or issues. He has been on the Cabometyx for about 3 month with no issues or side effects to report. No missed doses. He has follow up with Dr. Alen Blew tomorrow, Friday 3/10.  He reports mild nausea occasionally that resolves with zofran as needed.  States he has good energy and appetite but states taste buds are effected and food does not have the same taste as before.   Pt reports _0_ tablets/doses missed in the last week/month.    Will follow up and call patient again in _4 weeks__   Thank you,  Montel Clock, PharmD, [facility]

## 2016-02-28 ENCOUNTER — Other Ambulatory Visit: Payer: Self-pay | Admitting: Oncology

## 2016-02-28 ENCOUNTER — Other Ambulatory Visit (HOSPITAL_BASED_OUTPATIENT_CLINIC_OR_DEPARTMENT_OTHER): Payer: Medicare Other

## 2016-02-28 ENCOUNTER — Telehealth: Payer: Self-pay | Admitting: Oncology

## 2016-02-28 ENCOUNTER — Ambulatory Visit (HOSPITAL_BASED_OUTPATIENT_CLINIC_OR_DEPARTMENT_OTHER): Payer: Medicare Other | Admitting: Oncology

## 2016-02-28 DIAGNOSIS — R634 Abnormal weight loss: Secondary | ICD-10-CM | POA: Diagnosis not present

## 2016-02-28 DIAGNOSIS — Z85038 Personal history of other malignant neoplasm of large intestine: Secondary | ICD-10-CM | POA: Diagnosis not present

## 2016-02-28 DIAGNOSIS — C7800 Secondary malignant neoplasm of unspecified lung: Secondary | ICD-10-CM | POA: Diagnosis not present

## 2016-02-28 DIAGNOSIS — R197 Diarrhea, unspecified: Secondary | ICD-10-CM | POA: Diagnosis not present

## 2016-02-28 DIAGNOSIS — R630 Anorexia: Secondary | ICD-10-CM | POA: Diagnosis not present

## 2016-02-28 DIAGNOSIS — C649 Malignant neoplasm of unspecified kidney, except renal pelvis: Secondary | ICD-10-CM | POA: Diagnosis not present

## 2016-02-28 DIAGNOSIS — R110 Nausea: Secondary | ICD-10-CM | POA: Diagnosis not present

## 2016-02-28 DIAGNOSIS — J90 Pleural effusion, not elsewhere classified: Secondary | ICD-10-CM

## 2016-02-28 DIAGNOSIS — C189 Malignant neoplasm of colon, unspecified: Secondary | ICD-10-CM

## 2016-02-28 LAB — CBC WITH DIFFERENTIAL/PLATELET
BASO%: 0.4 % (ref 0.0–2.0)
BASOS ABS: 0 10*3/uL (ref 0.0–0.1)
EOS%: 3.9 % (ref 0.0–7.0)
Eosinophils Absolute: 0.3 10*3/uL (ref 0.0–0.5)
HEMATOCRIT: 39.2 % (ref 38.4–49.9)
HGB: 13.1 g/dL (ref 13.0–17.1)
LYMPH%: 33.6 % (ref 14.0–49.0)
MCH: 33 pg (ref 27.2–33.4)
MCHC: 33.4 g/dL (ref 32.0–36.0)
MCV: 98.8 fL — ABNORMAL HIGH (ref 79.3–98.0)
MONO#: 0.6 10*3/uL (ref 0.1–0.9)
MONO%: 8.2 % (ref 0.0–14.0)
NEUT#: 3.8 10*3/uL (ref 1.5–6.5)
NEUT%: 53.9 % (ref 39.0–75.0)
Platelets: 199 10*3/uL (ref 140–400)
RBC: 3.97 10*6/uL — AB (ref 4.20–5.82)
RDW: 15.9 % — ABNORMAL HIGH (ref 11.0–14.6)
WBC: 7.1 10*3/uL (ref 4.0–10.3)
lymph#: 2.4 10*3/uL (ref 0.9–3.3)

## 2016-02-28 LAB — COMPREHENSIVE METABOLIC PANEL
ALBUMIN: 2.5 g/dL — AB (ref 3.5–5.0)
ALK PHOS: 100 U/L (ref 40–150)
ALT: 20 U/L (ref 0–55)
AST: 25 U/L (ref 5–34)
Anion Gap: 10 mEq/L (ref 3–11)
BUN: 28.5 mg/dL — AB (ref 7.0–26.0)
CALCIUM: 8.2 mg/dL — AB (ref 8.4–10.4)
CO2: 19 mEq/L — ABNORMAL LOW (ref 22–29)
Chloride: 107 mEq/L (ref 98–109)
Creatinine: 1.4 mg/dL — ABNORMAL HIGH (ref 0.7–1.3)
EGFR: 48 mL/min/{1.73_m2} — ABNORMAL LOW (ref >90)
Glucose: 99 mg/dl (ref 70–140)
POTASSIUM: 4.4 meq/L (ref 3.5–5.1)
SODIUM: 136 meq/L (ref 136–145)
Total Bilirubin: 0.56 mg/dL (ref 0.20–1.20)
Total Protein: 6.7 g/dL (ref 6.4–8.3)

## 2016-02-28 MED ORDER — CABOZANTINIB S-MALATE 20 MG PO TABS: 20.0000 mg | ORAL_TABLET | Freq: Every day | ORAL | Status: DC

## 2016-02-28 NOTE — Telephone Encounter (Signed)
Gave and printed appt sched and avs fo rpt for April...gv barium

## 2016-02-28 NOTE — Progress Notes (Signed)
Hematology and Oncology Follow Up Visit  Hunter Hardin [id number] [birth year] 81 y.o. 02/28/2016 3:05 PM   Principle Diagnosis: 81 year old gentleman with:  1. T3 N0 colon cancer: He is status post a laparoscopic right hemicolectomy, with 0 out of 21 lymph nodes involved. That was done on 03/06/2013.  2. Renal cell carcinoma diagnosed in May of 2014. He presented with kidney mass and lung metastasis. 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  Prior therapy: Started Votrient 800 mg daily on 05/22/2013. Dose was reduced to 600 mg daily on 01/19/14 due to weight loss and diarrhea. Then reduced to 400 mg starting on 09/07/2014. The dose will be reduced further to 200 mg daily starting on 11/16/2014. The dose reduction is related to diarrhea and weight loss. Therapy discontinued in December 2016 for disease progression.  Current therapy: Cabometyx 40 mg daily started in December 2016.  Interim History: Hunter Hardin presents today for a follow up visit by himself. Since the last visit, he reports a few issues. He has developed upper respiratory tract infection as well as a poor appetite. He also reported occasional loose bowel habits and occasional diarrhea. He continues on Cabometyx without any dose interruption or delay. He denied any hand-foot syndrome.  He has not reported any pulmonary symptoms including dyspnea on exertion or shortness of breath. Has not reported any cough or hemoptysis. He does not report any exertional dyspnea or decline in his function status. His appetite has continued to decline and of lost 25 pounds in the last 3 months.    He is not report any headaches, blurry vision or syncope. He denies chest pain or dyspnea on exertion. He denies abdominal pain. He does not report any hematochezia. He has not reported any pain in his hands or feet. Has not reported any fevers or chills or sweats.  He does not report any  frequency urgency or hesitancy. Does not report any hematuria or dysuria. He does not report any pain especially in any skeletal structures. He denies any back pain or hip pain. He is not report any lymphadenopathy or petechiae.  Remainder of his review of systems unremarkable.   Medications: I have reviewed the patient's current medications. Reviewed today and unchanged. Current Outpatient Prescriptions  Medication Sig Dispense Refill  . doxazosin (CARDURA) 8 MG tablet TAKE 1 TABLET DAILY 90 tablet 3  . finasteride (PROSCAR) 5 MG tablet Take 5 mg by mouth daily.    . fish oil-omega-3 fatty acids 1000 MG capsule Take 1 g by mouth 2 (two) times daily.    . hydrochlorothiazide (HYDRODIURIL) 25 MG tablet Take 1 tablet (25 mg total) by mouth daily. 90 tablet 3  . lactose free nutrition (BOOST) LIQD Take 237 mLs by mouth daily.    [name] levothyroxine (SYNTHROID, LEVOTHROID) 50 MCG tablet TAKE 2 TABLETS ON SUNDAY AND TAKE 1 TABLET ON OTHER DAYS (TAKE 8 PER WEEK TOTAL) 105 tablet 3  . loperamide (IMODIUM A-D) 2 MG tablet Take 2 mg by mouth as needed for diarrhea or loose stools.    . ondansetron (ZOFRAN) 8 MG tablet Take 1 tablet (8 mg total) by mouth every 8 (eight) hours as needed. for nausea 20 tablet 3  . potassium chloride SA (KLOR-CON M20) 20 MEQ tablet Take 1 tablet (20 mEq total) by mouth 2 (two) times daily. 180 tablet 3  . propranolol (INDERAL) 40 MG tablet Take 0.5 tablets (20 mg total)  by mouth 2 (two) times daily. 90 tablet 3  . verapamil (CALAN-SR) 240 MG CR tablet Take 1 tablet (240 mg total) by mouth daily. 90 tablet 3  . vitamin C (ASCORBIC ACID) 500 MG tablet Take 500 mg by mouth daily.    . cabozantinib S-Malate 20 MG TABS Take 20 mg by mouth daily. 30 tablet 0   No current facility-administered medications for this visit.    Allergies: No Known Allergies  Past Medical History, Surgical history, Social history, and Family History were reviewed and updated.    Physical  Exam: Blood pressure 135/71, pulse 74, temperature 97.6 F (36.4 C), temperature source Oral, resp. rate 17, height '5\' 6"'$  (1.676 m), weight 127 lb (57.607 kg), SpO2 96 %. ECOG: 1 General appearance: alert awake Without distress frail appearing. Head: Normocephalic, without obvious abnormality no oral thrush noted. Neck: no adenopathy, no masses or lesions. Lymph nodes: Cervical, supraclavicular, and axillary nodes normal. Heart:regular rate and rhythm, S1, S2. No murmurs rubs or gallops. Lung:chest clear, no wheezing, rales, no dullness to percussion. Chest wall examination: No tenderness or masses noted. Abdomen: soft, non-tender, without masses or organomegaly no shifting dullness or ascites. EXT: Trace edema noted bilaterally. Skin: No erythema or induration noted.   Lab Results: Lab Results  Component Value Date   WBC 7.1 02/28/2016   HGB 13.1 02/28/2016   HCT 39.2 02/28/2016   MCV 98.8* 02/28/2016   PLT 199 02/28/2016     CBC    Component Value Date/Time   WBC 7.1 02/28/2016 1431   WBC 5.7 04/02/2014 1416   RBC 3.97* 02/28/2016 1431   RBC 2.95* 04/02/2014 1416   HGB 13.1 02/28/2016 1431   HGB 10.6* 04/02/2014 1416   HCT 39.2 02/28/2016 1431   HCT 31.3* 04/02/2014 1416   PLT 199 02/28/2016 1431   PLT 126.0* 04/02/2014 1416   MCV 98.8* 02/28/2016 1431   MCV 106.3* 04/02/2014 1416   MCH 33.0 02/28/2016 1431   MCH 27.2 05/03/2013 0900   MCHC 33.4 02/28/2016 1431   MCHC 33.8 04/02/2014 1416   RDW 15.9* 02/28/2016 1431   RDW 15.4* 04/02/2014 1416   LYMPHSABS 2.4 02/28/2016 1431   LYMPHSABS 2.5 04/02/2014 1416   MONOABS 0.6 02/28/2016 1431   MONOABS 0.4 04/02/2014 1416   EOSABS 0.3 02/28/2016 1431   EOSABS 0.2 04/02/2014 1416   BASOSABS 0.0 02/28/2016 1431   BASOSABS 0.0 04/02/2014 1416         A 81 year old gentleman with the following issues.   1. Colon cancer: He is status post a laparoscopic right hemicolectomy, with the pathology revealing a T3 N0,  with 0 out of 21 lymph nodes involved. He did not receive adjuvant chemotherapy for colon cancer given that he has stage IV kidney cancer.  He is currently on active surveillance and has no signs or symptoms of cancer recurrence.  2. Renal cell cancer: he has stage IV disease with lung involvment. He is currently on Votrient dose reduced to 200 mg daily.  CT scan from 11/22/2015 showed clear progression of disease.   He has been on Cabometyx since December 2017. He did have a few complications related to therapy including weight loss, poor appetite and slight diarrhea. I have instructed him to hold this medication for a week which hopefully will help his appetite. We will resume this medication on a lower dose of 20 mg daily starting 7 days from now. The plan is to repeat imaging studies in April 2017.  3. Weight loss: Likely related to Cabometyx. I continued to advise him to use nutritional supplements and dose reduction should help moving forward.  4. Diarrhea. Seems to be manageable at this time with the help of Imodium.  5. Thrombocytopenia. Normalized at this time off Votrient.  6. Nausea: Controlled by Zofran. No changes in his nausea at this time.  7. Pleural effusion: small and asymptomatic. No changes in his respiratory status and repeat imaging studies will help determine the status.  8. The transaminases surveillance: Have been within normal range and repeated today.  9. Followup: Will be in one month.       Zola Button, MD 3/10/20173:05 PM

## 2016-03-11 ENCOUNTER — Telehealth: Payer: Self-pay | Admitting: *Deleted

## 2016-03-11 ENCOUNTER — Other Ambulatory Visit: Payer: Self-pay | Admitting: *Deleted

## 2016-03-11 NOTE — Telephone Encounter (Signed)
No note

## 2016-03-11 NOTE — Telephone Encounter (Signed)
Patient calling to say he has been of of his cabozantinib since last Friday 03/06/16 and he feels great. Per dr Alen Blew, resume medication now, call back if any problems and to keep regularly scheduled appt for lab and CT on 03/31/16 and dr Alen Blew 04/02/16. Patient verbalized understanding.

## 2016-03-20 ENCOUNTER — Other Ambulatory Visit: Payer: Self-pay | Admitting: Oncology

## 2016-03-31 ENCOUNTER — Encounter (HOSPITAL_COMMUNITY): Payer: Self-pay

## 2016-03-31 ENCOUNTER — Ambulatory Visit (HOSPITAL_COMMUNITY)
Admission: RE | Admit: 2016-03-31 | Discharge: 2016-03-31 | Disposition: A | Discharge status: Home / Self Care | Payer: Medicare Other | Source: Ambulatory Visit | Attending: Oncology | Admitting: Oncology

## 2016-03-31 ENCOUNTER — Other Ambulatory Visit: Payer: Self-pay | Admitting: Oncology

## 2016-03-31 ENCOUNTER — Other Ambulatory Visit (HOSPITAL_BASED_OUTPATIENT_CLINIC_OR_DEPARTMENT_OTHER): Payer: Medicare Other

## 2016-03-31 DIAGNOSIS — C189 Malignant neoplasm of colon, unspecified: Secondary | ICD-10-CM

## 2016-03-31 DIAGNOSIS — R918 Other nonspecific abnormal finding of lung field: Secondary | ICD-10-CM | POA: Diagnosis not present

## 2016-03-31 DIAGNOSIS — Z9049 Acquired absence of other specified parts of digestive tract: Secondary | ICD-10-CM | POA: Insufficient documentation

## 2016-03-31 DIAGNOSIS — J90 Pleural effusion, not elsewhere classified: Secondary | ICD-10-CM | POA: Diagnosis not present

## 2016-03-31 DIAGNOSIS — I2510 Atherosclerotic heart disease of native coronary artery without angina pectoris: Secondary | ICD-10-CM | POA: Diagnosis not present

## 2016-03-31 DIAGNOSIS — C642 Malignant neoplasm of left kidney, except renal pelvis: Secondary | ICD-10-CM | POA: Diagnosis not present

## 2016-03-31 DIAGNOSIS — K5730 Diverticulosis of large intestine without perforation or abscess without bleeding: Secondary | ICD-10-CM | POA: Insufficient documentation

## 2016-03-31 DIAGNOSIS — C7800 Secondary malignant neoplasm of unspecified lung: Secondary | ICD-10-CM | POA: Diagnosis not present

## 2016-03-31 DIAGNOSIS — J849 Interstitial pulmonary disease, unspecified: Secondary | ICD-10-CM | POA: Diagnosis not present

## 2016-03-31 DIAGNOSIS — C782 Secondary malignant neoplasm of pleura: Secondary | ICD-10-CM | POA: Diagnosis not present

## 2016-03-31 DIAGNOSIS — N400 Enlarged prostate without lower urinary tract symptoms: Secondary | ICD-10-CM | POA: Diagnosis not present

## 2016-03-31 DIAGNOSIS — J479 Bronchiectasis, uncomplicated: Secondary | ICD-10-CM | POA: Diagnosis not present

## 2016-03-31 LAB — COMPREHENSIVE METABOLIC PANEL
ALT: 16 U/L (ref 0–55)
ANION GAP: 7 meq/L (ref 3–11)
AST: 20 U/L (ref 5–34)
Albumin: 2.3 g/dL — ABNORMAL LOW (ref 3.5–5.0)
Alkaline Phosphatase: 65 U/L (ref 40–150)
BILIRUBIN TOTAL: 0.49 mg/dL (ref 0.20–1.20)
BUN: 13.9 mg/dL (ref 7.0–26.0)
CHLORIDE: 106 meq/L (ref 98–109)
CO2: 26 meq/L (ref 22–29)
Calcium: 8 mg/dL — ABNORMAL LOW (ref 8.4–10.4)
Creatinine: 0.9 mg/dL (ref 0.7–1.3)
EGFR: 82 mL/min/{1.73_m2} — AB (ref >90)
GLUCOSE: 84 mg/dL (ref 70–140)
POTASSIUM: 4.1 meq/L (ref 3.5–5.1)
SODIUM: 139 meq/L (ref 136–145)
Total Protein: 5.8 g/dL — ABNORMAL LOW (ref 6.4–8.3)

## 2016-03-31 LAB — CBC WITH DIFFERENTIAL/PLATELET
BASO%: 0.4 % (ref 0.0–2.0)
BASOS ABS: 0 10*3/uL (ref 0.0–0.1)
EOS ABS: 0.3 10*3/uL (ref 0.0–0.5)
EOS%: 4.3 % (ref 0.0–7.0)
HCT: 35.3 % — ABNORMAL LOW (ref 38.4–49.9)
HEMOGLOBIN: 11.6 g/dL — AB (ref 13.0–17.1)
LYMPH%: 39.2 % (ref 14.0–49.0)
MCH: 33.6 pg — AB (ref 27.2–33.4)
MCHC: 32.8 g/dL (ref 32.0–36.0)
MCV: 102.4 fL — AB (ref 79.3–98.0)
MONO#: 0.6 10*3/uL (ref 0.1–0.9)
MONO%: 8.8 % (ref 0.0–14.0)
NEUT%: 47.3 % (ref 39.0–75.0)
NEUTROS ABS: 3.4 10*3/uL (ref 1.5–6.5)
PLATELETS: 169 10*3/uL (ref 140–400)
RBC: 3.45 10*6/uL — AB (ref 4.20–5.82)
RDW: 15.8 % — ABNORMAL HIGH (ref 11.0–14.6)
WBC: 7.2 10*3/uL (ref 4.0–10.3)
lymph#: 2.8 10*3/uL (ref 0.9–3.3)

## 2016-03-31 MED ORDER — IOPAMIDOL (ISOVUE-300) INJECTION 61%
100.0000 mL | Freq: Once | INTRAVENOUS | Status: AC | PRN
  Administered 2016-03-31: 100 mL via INTRAVENOUS

## 2016-04-02 ENCOUNTER — Ambulatory Visit (HOSPITAL_BASED_OUTPATIENT_CLINIC_OR_DEPARTMENT_OTHER): Payer: Medicare Other | Admitting: Oncology

## 2016-04-02 ENCOUNTER — Telehealth: Payer: Self-pay | Admitting: Oncology

## 2016-04-02 VITALS — BP 136/65 | HR 62 | Temp 97.6°F | Resp 18 | Ht 66.0 in | Wt 144.0 lb

## 2016-04-02 DIAGNOSIS — C649 Malignant neoplasm of unspecified kidney, except renal pelvis: Secondary | ICD-10-CM | POA: Diagnosis not present

## 2016-04-02 DIAGNOSIS — C7800 Secondary malignant neoplasm of unspecified lung: Secondary | ICD-10-CM

## 2016-04-02 DIAGNOSIS — J90 Pleural effusion, not elsewhere classified: Secondary | ICD-10-CM | POA: Diagnosis not present

## 2016-04-02 DIAGNOSIS — Z85038 Personal history of other malignant neoplasm of large intestine: Secondary | ICD-10-CM

## 2016-04-02 NOTE — Telephone Encounter (Signed)
Gave and printed appt sched and avs fo rpt for May °

## 2016-04-02 NOTE — Progress Notes (Signed)
Hematology and Oncology Follow Up Visit  Hunter Hardin [id number] 08/24/1935 81 y.o. 04/02/2016 8:32 AM   Principle Diagnosis: 81 year old gentleman with:  1. T3 N0 colon cancer: He is status post a laparoscopic right hemicolectomy, with 0 out of 21 lymph nodes involved. That was done on 03/06/2013.  2. Renal cell carcinoma diagnosed in May of 2014. He presented with kidney mass and lung metastasis. 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  Prior therapy: Started Votrient 800 mg daily on 05/22/2013. Dose was reduced to 600 mg daily on 01/19/14 due to weight loss and diarrhea. Then reduced to 400 mg starting on 09/07/2014. The dose will be reduced further to 200 mg daily starting on 11/16/2014. The dose reduction is related to diarrhea and weight loss. Therapy discontinued in December 2016 for disease progression.  Current therapy: Cabometyx 40 mg daily started in December 2016. This was changed to 20 mg daily in March 2017.  Interim History: Hunter Hardin presents today for a follow up visit by himself. Since the last visit, he reports continuous improvement in his health. He is eating better and have gained more weight. He denied any diarrhea or excessive fatigue. He continues on Cabometyx without any dose interruption or delay. He denied any hand-foot syndrome. He has tolerated this current dose much better than the 40 mg.  He has not reported any pulmonary symptoms including dyspnea on exertion or shortness of breath. Has not reported any cough or hemoptysis. He does not report any exertional dyspnea or decline in his function status. His appetite has improved and have gained some more weight.   He is not report any headaches, blurry vision or syncope. He denies chest pain or dyspnea on exertion. He denies abdominal pain. He does not report any hematochezia. He has not reported any pain in his hands or feet. Has not reported any  fevers or chills or sweats.  He does not report any frequency urgency or hesitancy. Does not report any hematuria or dysuria. He does not report any pain especially in any skeletal structures. He denies any back pain or hip pain. He is not report any lymphadenopathy or petechiae.  Remainder of his review of systems unremarkable.   Medications: I have reviewed the patient's current medications. Reviewed today and unchanged. Current Outpatient Prescriptions  Medication Sig Dispense Refill  . CABOMETYX 20 MG TABS TAKE 1 TABLET DAILY ON AN EMPTY STOMACH AT LEAST 1 HOUR BEFORE OR 2 HOURS AFTER EATING 30 tablet 0  . doxazosin (CARDURA) 8 MG tablet TAKE 1 TABLET DAILY 90 tablet 3  . finasteride (PROSCAR) 5 MG tablet Take 5 mg by mouth daily.    . fish oil-omega-3 fatty acids 1000 MG capsule Take 1 g by mouth 2 (two) times daily.    . hydrochlorothiazide (HYDRODIURIL) 25 MG tablet Take 1 tablet (25 mg total) by mouth daily. 90 tablet 3  . lactose free nutrition (BOOST) LIQD Take 237 mLs by mouth daily.    [name] levothyroxine (SYNTHROID, LEVOTHROID) 50 MCG tablet TAKE 2 TABLETS ON SUNDAY AND TAKE 1 TABLET ON OTHER DAYS (TAKE 8 PER WEEK TOTAL) 105 tablet 3  . loperamide (IMODIUM A-D) 2 MG tablet Take 2 mg by mouth as needed for diarrhea or loose stools.    . ondansetron (ZOFRAN) 8 MG tablet Take 1 tablet (8 mg total) by mouth every 8 (eight) hours as needed. for nausea 20 tablet 3  .  potassium chloride SA (KLOR-CON M20) 20 MEQ tablet Take 1 tablet (20 mEq total) by mouth 2 (two) times daily. 180 tablet 3  . propranolol (INDERAL) 40 MG tablet Take 0.5 tablets (20 mg total) by mouth 2 (two) times daily. 90 tablet 3  . verapamil (CALAN-SR) 240 MG CR tablet Take 1 tablet (240 mg total) by mouth daily. 90 tablet 3  . vitamin C (ASCORBIC ACID) 500 MG tablet Take 500 mg by mouth daily.     No current facility-administered medications for this visit.    Allergies: No Known Allergies  Past Medical History,  Surgical history, Social history, and Family History were reviewed and updated.    Physical Exam: Blood pressure 136/65, pulse 62, temperature 97.6 F (36.4 C), temperature source Oral, resp. rate 18, height '5\' 6"'$  (1.676 m), weight 144 lb (65.318 kg), SpO2 97 %. ECOG: 1 General appearance: Alert, awake gentleman without distress. Head: Normocephalic, without obvious abnormality no oral ulcers or lesions. Neck: no adenopathy, no masses or lesions. Lymph nodes: Cervical, supraclavicular, and axillary nodes normal. Heart:regular rate and rhythm, S1, S2. No murmurs rubs or gallops. Lung:chest clear, no wheezing, rales, no dullness to percussion. Chest wall examination: No tenderness or masses noted. Abdomen: soft, non-tender, without masses or organomegaly no rebound or guarding. EXT: Trace edema noted bilaterally. Skin: No erythema or induration noted.   Lab Results: Lab Results  Component Value Date   WBC 7.2 03/31/2016   HGB 11.6* 03/31/2016   HCT 35.3* 03/31/2016   MCV 102.4* 03/31/2016   PLT 169 03/31/2016     CBC    Component Value Date/Time   WBC 7.2 03/31/2016 0746   WBC 5.7 04/02/2014 1416   RBC 3.45* 03/31/2016 0746   RBC 2.95* 04/02/2014 1416   HGB 11.6* 03/31/2016 0746   HGB 10.6* 04/02/2014 1416   HCT 35.3* 03/31/2016 0746   HCT 31.3* 04/02/2014 1416   PLT 169 03/31/2016 0746   PLT 126.0* 04/02/2014 1416   MCV 102.4* 03/31/2016 0746   MCV 106.3* 04/02/2014 1416   MCH 33.6* 03/31/2016 0746   MCH 27.2 05/03/2013 0900   MCHC 32.8 03/31/2016 0746   MCHC 33.8 04/02/2014 1416   RDW 15.8* 03/31/2016 0746   RDW 15.4* 04/02/2014 1416   LYMPHSABS 2.8 03/31/2016 0746   LYMPHSABS 2.5 04/02/2014 1416   MONOABS 0.6 03/31/2016 0746   MONOABS 0.4 04/02/2014 1416   EOSABS 0.3 03/31/2016 0746   EOSABS 0.2 04/02/2014 1416   BASOSABS 0.0 03/31/2016 0746   BASOSABS 0.0 04/02/2014 1416    EXAM: CT CHEST WITH CONTRAST  CT ABDOMEN AND PELVIS WITH AND WITHOUT  CONTRAST  TECHNIQUE: Multidetector CT imaging of the chest was performed during intravenous contrast administration. Multidetector CT imaging of the abdomen and pelvis was performed following the standard protocol before and during bolus administration of intravenous contrast.  CONTRAST: 131m ISOVUE-300 IOPAMIDOL (ISOVUE-300) INJECTION 61%  COMPARISON: 11/22/2015 CT chest, abdomen and pelvis.  FINDINGS: CT CHEST FINDINGS  Mediastinum/Nodes: Normal heart size. No pericardial fluid/thickening. Three-vessel coronary atherosclerosis. Great vessels are normal in course and caliber. No central pulmonary emboli. Normal visualized thyroid. Normal esophagus. No axillary adenopathy. 2.3 cm enlarged left pericardiophrenic node (series 6/ image 79), decreased from 3.1 cm. Stable mild bilateral hilar lymphadenopathy including a mildly enlarged 1.2 cm right hilar node (series 6/ image 43) and a mildly enlarged 1.0 cm left hilar node (series 6/ image 40). No additional pathologically enlarged mediastinal or hilar nodes.  Lungs/Pleura: Small layering right pleural effusion  is mildly increased. Moderate to large layering left pleural effusion is increased. Multiple nodular pleural tumor deposits in the left pleural space appear mildly decreased in size, for example a 0.9 cm thickness medial basilar left pleural space tumor implant (series 6/ image 93), previously 1.6 cm. There is a new small pneumothorax component in the anterior mid to lower left pleural space. Stable subcentimeter calcified granuloma in the right middle lobe. Left lower lobe 4.2 x 2.5 cm lung mass (series 8/ image 64) is decreased from 5.0 x 3.1 cm and demonstrates partial internal cavitation. No residual or new significant pulmonary nodules. New bandlike consolidation in the lingula is nonspecific, favor atelectasis. There is patchy peripheral basilar predominant reticulation and traction bronchiectasis in both  lungs, which appears increased. No frank honeycombing.  Musculoskeletal: No aggressive appearing focal osseous lesions. Mild degenerative changes in the thoracic spine.  CT ABDOMEN PELVIS FINDINGS  Hepatobiliary: Normal liver with no liver mass. Cholecystectomy. No biliary ductal dilatation.  Pancreas: Normal, with no mass or duct dilation.  Spleen: Normal size. No mass.  Adrenals/Urinary Tract: Normal adrenals. Stable simple 1.2 cm renal cyst in the posterior interpolar right kidney. Exophytic lateral upper left renal 5.7 x 4.4 cm mass with heterogeneous nodular internal enhancement (series 6/ image 107) previously measured 5.9 x 5.1 cm, mildly decreased in size. No new renal lesions. No hydronephrosis. Normal bladder.  Stomach/Bowel: Grossly normal stomach. Normal caliber small bowel with no small bowel wall thickening. Stable postsurgical changes from subtotal right hemicolectomy with ileocolic anastomosis in the right abdomen. Mild diverticulosis in the sigmoid colon, with no large bowel wall thickening or pericolonic fat stranding in the remnant large bowel.  Vascular/Lymphatic: Atherosclerotic nonaneurysmal abdominal aorta. Patent portal, splenic, hepatic and renal veins. No pathologically enlarged lymph nodes in the abdomen or pelvis.  Reproductive: Stable moderate prostatomegaly with coarse nonspecific internal prostatic calcifications.  Other: No pneumoperitoneum, ascites or focal fluid collection.  Musculoskeletal: No aggressive appearing focal osseous lesions. Moderate degenerative changes in the lumbar spine.  IMPRESSION: 1. Partial treatment response, with decreased left pericardiophrenic mediastinal lymphadenopathy, stable mild bilateral hilar lymphadenopathy, decreased lung and left pleural metastases and decreased left renal tumor. No new sites of metastatic disease. 2. Moderate to large left hydropneumothorax, increased, with a new small  left pneumothorax component. 3. Small right pleural effusion, increased. 4. Peripheral basilar predominant interstitial lung disease characterized by reticulation and traction bronchiectasis, slightly worsened, worrisome for usual interstitial pneumonia (UIP). 5. Additional findings include three-vessel coronary atherosclerosis, mild sigmoid diverticulosis and moderate prostatomegaly. Critical Value/emergent results were called by telephone at the time of interpretation on 03/31/2016 at 10:29 am to Dr. Zola Button , who verbally acknowledged these results.      A 81 year old gentleman with the following issues.   1. Colon cancer: He is status post a laparoscopic right hemicolectomy, with the pathology revealing a T3 N0, with 0 out of 21 lymph nodes involved. He did not receive adjuvant chemotherapy for colon cancer given that he has stage IV kidney cancer.  He is currently on active surveillance and has no signs or symptoms of cancer recurrence.  2. Renal cell cancer: he has stage IV disease with lung involvment. He is currently on Votrient dose reduced to 200 mg daily.  CT scan from 11/22/2015 showed clear progression of disease.   He has been on Cabometyx since December 2017. His dose has been reduced from 40 mg to 20 mg and have tolerated it much better. CT scan on 03/31/2016 was reviewed  and discussed with the patient today and showed reasonable response to therapy. He has decrease in his lymphadenopathy as well as pulmonary nodules. The plan is to continue the same dose and schedule and repeat imaging studies in 3 months.   3. Weight loss: Likely related to Cabometyx. His weight has improved and I continue to encourage him to take nutritional supplements at least twice a day.  4. Diarrhea. Seems to be manageable at this time with the help of Imodium.  5. Thrombocytopenia. Normalized at this time.  6. Nausea: Controlled by Zofran. No changes in his nausea at this time.  7.  Pleural effusion/pneumothorax: small and asymptomatic. I gave him clear instructions he develops any respiratory symptoms to reported as soon as possible for possible intervention. He might need thoracentesis and possible pigtail placement if his pneumothorax expands.  8. The transaminases surveillance: Have been within normal range and repeated today.  9. Followup: Will be in one month.       [facility], MD 4/13/20178:32 AM

## 2016-04-08 ENCOUNTER — Telehealth: Payer: Self-pay | Admitting: Pharmacist

## 2016-04-08 DIAGNOSIS — H35033 Hypertensive retinopathy, bilateral: Secondary | ICD-10-CM | POA: Diagnosis not present

## 2016-04-08 DIAGNOSIS — I10 Essential (primary) hypertension: Secondary | ICD-10-CM | POA: Diagnosis not present

## 2016-04-08 DIAGNOSIS — H2513 Age-related nuclear cataract, bilateral: Secondary | ICD-10-CM | POA: Diagnosis not present

## 2016-04-08 NOTE — Telephone Encounter (Signed)
04/08/16: Attempted to reach patient for follow up on oral medication: Cabometyx. No answer. Left VM for patient to call back with any questions or issues.   Thank you,  Montel Clock, PharmD, [facility] [phone]

## 2016-04-17 ENCOUNTER — Encounter: Payer: Self-pay | Admitting: Internal Medicine

## 2016-04-17 ENCOUNTER — Ambulatory Visit (INDEPENDENT_AMBULATORY_CARE_PROVIDER_SITE_OTHER): Payer: Medicare Other | Admitting: Internal Medicine

## 2016-04-17 VITALS — BP 130/78 | HR 66 | Temp 98.1°F | Wt 142.0 lb

## 2016-04-17 DIAGNOSIS — K648 Other hemorrhoids: Secondary | ICD-10-CM

## 2016-04-17 DIAGNOSIS — K644 Residual hemorrhoidal skin tags: Secondary | ICD-10-CM | POA: Insufficient documentation

## 2016-04-17 MED ORDER — HYDROCORTISONE 2.5 % EX CREA: TOPICAL_CREAM | Freq: Three times a day (TID) | CUTANEOUS | Status: DC | PRN

## 2016-04-17 NOTE — Progress Notes (Signed)
Pre visit review using our clinic review tool, if applicable. No additional management support is needed unless otherwise documented below in the visit note. 

## 2016-04-17 NOTE — Assessment & Plan Note (Signed)
Large and symptomatic Not thrombosed so I would not incise here Will start cortisone cream Witch hazel Sitz bath GI eval to consider banding

## 2016-04-17 NOTE — Patient Instructions (Signed)
Please try the prescription cream three times a day on the hemorrhoid. Use wipes with witch hazel (Tucks) instead of toilet paper. Consider trying sitz baths.

## 2016-04-17 NOTE — Progress Notes (Signed)
Subjective:    Patient ID: Hunter Hardin., male    DOB: 10/22/1935, 81 y.o.   MRN: [id number]  HPI Here due to trouble with a hemorrhoid  Going on for a couple of weeks Protruding out of rectum---"like a little ball" Using preparation H---but not helping much No bleeding Pain when wiping Never had trouble before  Bowels have been regular Goes twice a day--have been loose (due to med for cancer) Not straining Not using the loperamide lately  Current Outpatient Prescriptions on File Prior to Visit  Medication Sig Dispense Refill  . CABOMETYX 20 MG TABS TAKE 1 TABLET DAILY ON AN EMPTY STOMACH AT LEAST 1 HOUR BEFORE OR 2 HOURS AFTER EATING 30 tablet 0  . doxazosin (CARDURA) 8 MG tablet TAKE 1 TABLET DAILY 90 tablet 3  . finasteride (PROSCAR) 5 MG tablet Take 5 mg by mouth daily.    . fish oil-omega-3 fatty acids 1000 MG capsule Take 1 g by mouth 2 (two) times daily.    . hydrochlorothiazide (HYDRODIURIL) 25 MG tablet Take 1 tablet (25 mg total) by mouth daily. 90 tablet 3  . lactose free nutrition (BOOST) LIQD Take 237 mLs by mouth daily.    [name] levothyroxine (SYNTHROID, LEVOTHROID) 50 MCG tablet TAKE 2 TABLETS ON SUNDAY AND TAKE 1 TABLET ON OTHER DAYS (TAKE 8 PER WEEK TOTAL) 105 tablet 3  . loperamide (IMODIUM A-D) 2 MG tablet Take 2 mg by mouth as needed for diarrhea or loose stools.    . ondansetron (ZOFRAN) 8 MG tablet Take 1 tablet (8 mg total) by mouth every 8 (eight) hours as needed. for nausea 20 tablet 3  . potassium chloride SA (KLOR-CON M20) 20 MEQ tablet Take 1 tablet (20 mEq total) by mouth 2 (two) times daily. 180 tablet 3  . propranolol (INDERAL) 40 MG tablet Take 0.5 tablets (20 mg total) by mouth 2 (two) times daily. 90 tablet 3  . verapamil (CALAN-SR) 240 MG CR tablet Take 1 tablet (240 mg total) by mouth daily. 90 tablet 3  . vitamin C (ASCORBIC ACID) 500 MG tablet Take 500 mg by mouth daily.     No current facility-administered medications on file prior to  visit.    No Known Allergies  Past Medical History  Diagnosis Date  . Hypothyroidism   . Hypertension   . HLD (hyperlipidemia)   . Elevated PSA     H/O  . GERD (gastroesophageal reflux disease)   . Protein calorie malnutrition ([place]) 06/13/2013  . renal ca dx'd 01/2013  . Metastasis to lung ([place]) dx'd 01/2013  . Colon cancer ([place]) dx'd 01/2013    Past Surgical History  Procedure Laterality Date  . Cholecystectomy  early 6's  . Vasectomy    . Colonoscopy  01/2013  . Laparoscopic partial colectomy N/A 03/06/2013    Procedure: LAPAROSCOPic assisted right hemi COLECTOMY;  Surgeon: Leighton Ruff, MD;  Location: [facility];  Service: General;  Laterality: N/A;    Family History  Problem Relation Age of Onset  . Hypertension Mother   . Hyperlipidemia Mother   . Dementia Mother   . Heart disease Father     ? MI, CAD  . Diabetes Cousin   . Cancer Brother     oral/tonsil cancer 2012  . Colon cancer Neg Hx   . Prostate cancer Neg Hx   . Cancer Sister     breast cancer    Social History   Social History  . Marital Status: Divorced  Spouse Name: N/A  . Number of Children: 2  . Years of Education: N/A   Occupational History  . Retired AF/[employer]     Retired totally from part-time Truck Driver/Security [place]  . Smoking status: Former Smoker -- 2.00 packs/day for 30 years    Types: Cigarettes    Quit date: 12/21/1985  . Smokeless tobacco: Never Used  . Alcohol Use: 0.0 oz/week    0 Standard drinks or equivalent per week     Comment: beer occassionally  . Drug Use: No  . Sexual Activity: Not on file   Other Topics Concern  . Not on file   Social History Narrative   Divorced, lives alone   Walking for exercise   [employer] [phone]   2 kids, both out of town   Enjoys yard work   Daily caffeine   Review of Systems  Appetite has improved lately  Gained weight back No abdominal pain Colonoscopy ~3 years ago---then had partial colectomy       Objective:   Physical Exam  Constitutional: He appears well-developed and well-nourished. No distress.  Genitourinary:  Large dilated hemorrhoid ---blocking rectum. Mild to moderate tenderness but not thrombosed No internal masses felt          Assessment & Plan:

## 2016-04-28 ENCOUNTER — Telehealth: Payer: Self-pay | Admitting: Oncology

## 2016-04-28 ENCOUNTER — Ambulatory Visit (HOSPITAL_BASED_OUTPATIENT_CLINIC_OR_DEPARTMENT_OTHER): Payer: Medicare Other | Admitting: Oncology

## 2016-04-28 ENCOUNTER — Other Ambulatory Visit (HOSPITAL_BASED_OUTPATIENT_CLINIC_OR_DEPARTMENT_OTHER): Payer: Medicare Other

## 2016-04-28 VITALS — BP 127/66 | HR 81 | Temp 98.1°F | Resp 16 | Ht 66.0 in | Wt 138.3 lb

## 2016-04-28 DIAGNOSIS — Z85038 Personal history of other malignant neoplasm of large intestine: Secondary | ICD-10-CM

## 2016-04-28 DIAGNOSIS — R110 Nausea: Secondary | ICD-10-CM

## 2016-04-28 DIAGNOSIS — C649 Malignant neoplasm of unspecified kidney, except renal pelvis: Secondary | ICD-10-CM

## 2016-04-28 DIAGNOSIS — C7800 Secondary malignant neoplasm of unspecified lung: Secondary | ICD-10-CM

## 2016-04-28 DIAGNOSIS — D696 Thrombocytopenia, unspecified: Secondary | ICD-10-CM

## 2016-04-28 LAB — COMPREHENSIVE METABOLIC PANEL
ALT: 19 U/L (ref 0–55)
AST: 16 U/L (ref 5–34)
Albumin: 2.5 g/dL — ABNORMAL LOW (ref 3.5–5.0)
Alkaline Phosphatase: 51 U/L (ref 40–150)
Anion Gap: 5 mEq/L (ref 3–11)
BUN: 19.7 mg/dL (ref 7.0–26.0)
CHLORIDE: 111 meq/L — AB (ref 98–109)
CO2: 27 meq/L (ref 22–29)
CREATININE: 0.9 mg/dL (ref 0.7–1.3)
Calcium: 8.4 mg/dL (ref 8.4–10.4)
EGFR: 81 mL/min/{1.73_m2} — ABNORMAL LOW (ref >90)
Glucose: 91 mg/dl (ref 70–140)
Potassium: 4.3 mEq/L (ref 3.5–5.1)
Sodium: 142 mEq/L (ref 136–145)
Total Bilirubin: 0.38 mg/dL (ref 0.20–1.20)
Total Protein: 5.9 g/dL — ABNORMAL LOW (ref 6.4–8.3)

## 2016-04-28 LAB — CBC WITH DIFFERENTIAL/PLATELET
BASO%: 0.2 % (ref 0.0–2.0)
Basophils Absolute: 0 10*3/uL (ref 0.0–0.1)
EOS%: 4.1 % (ref 0.0–7.0)
Eosinophils Absolute: 0.3 10*3/uL (ref 0.0–0.5)
HEMATOCRIT: 36.5 % — AB (ref 38.4–49.9)
HGB: 11.6 g/dL — ABNORMAL LOW (ref 13.0–17.1)
LYMPH#: 2.6 10*3/uL (ref 0.9–3.3)
LYMPH%: 42.7 % (ref 14.0–49.0)
MCH: 33.4 pg (ref 27.2–33.4)
MCHC: 31.8 g/dL — AB (ref 32.0–36.0)
MCV: 105.2 fL — ABNORMAL HIGH (ref 79.3–98.0)
MONO#: 0.4 10*3/uL (ref 0.1–0.9)
MONO%: 7 % (ref 0.0–14.0)
NEUT#: 2.8 10*3/uL (ref 1.5–6.5)
NEUT%: 46 % (ref 39.0–75.0)
Platelets: 139 10*3/uL — ABNORMAL LOW (ref 140–400)
RBC: 3.47 10*6/uL — AB (ref 4.20–5.82)
RDW: 15.2 % — ABNORMAL HIGH (ref 11.0–14.6)
WBC: 6.1 10*3/uL (ref 4.0–10.3)

## 2016-04-28 NOTE — Progress Notes (Signed)
Hematology and Oncology Follow Up Visit  Hunter Hardin [id number] Apr 01, 1935 81 y.o. 04/28/2016 2:27 PM   Principle Diagnosis: 81 year old gentleman with:  1. T3 N0 colon cancer: He is status post a laparoscopic right hemicolectomy, with 0 out of 21 lymph nodes involved. That was done on 03/06/2013.  2. Renal cell carcinoma diagnosed in May of 2014. He presented with kidney mass and lung metastasis. 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  Prior therapy: Started Votrient 800 mg daily on 05/22/2013. Dose was reduced to 600 mg daily on 01/19/14 due to weight loss and diarrhea. Then reduced to 400 mg starting on 09/07/2014. The dose will be reduced further to 200 mg daily starting on 11/16/2014. The dose reduction is related to diarrhea and weight loss. Therapy discontinued in December 2016 for disease progression.  Current therapy: Cabometyx 40 mg daily started in December 2016. This was changed to 20 mg daily in March 2017.  Interim History: Hunter Hardin presents today for a follow up visit by himself. Since the last visit, he reports doing fairly well without any recent complaints. He denied any respiratory symptoms such as cough, wheezing or hemoptysis. He continues to perform activities of daily living without any major decline. He is eating better compared to previous months although he did lose some weight since the last visit. He denied any diarrhea or excessive fatigue. He continues on Cabometyx without any dose interruption or delay. He denied any diarrhea but did have one hemorrhoid that is symptomatic at times.    He is not report any headaches, blurry vision or syncope. He denies chest pain or dyspnea on exertion. He denies abdominal pain. He does not report any hematochezia. He has not reported any pain in his hands or feet. Has not reported any fevers or chills or sweats.  He does not report any frequency urgency or  hesitancy. Does not report any hematuria or dysuria. He does not report any pain especially in any skeletal structures. He denies any back pain or hip pain. He is not report any lymphadenopathy or petechiae.  Remainder of his review of systems unremarkable.   Medications: I have reviewed the patient's current medications. Reviewed today and unchanged. Current Outpatient Prescriptions  Medication Sig Dispense Refill  . CABOMETYX 20 MG TABS TAKE 1 TABLET DAILY ON AN EMPTY STOMACH AT LEAST 1 HOUR BEFORE OR 2 HOURS AFTER EATING 30 tablet 0  . doxazosin (CARDURA) 8 MG tablet TAKE 1 TABLET DAILY 90 tablet 3  . finasteride (PROSCAR) 5 MG tablet Take 5 mg by mouth daily.    . fish oil-omega-3 fatty acids 1000 MG capsule Take 1 g by mouth 2 (two) times daily.    . hydrochlorothiazide (HYDRODIURIL) 25 MG tablet Take 1 tablet (25 mg total) by mouth daily. 90 tablet 3  . hydrocortisone 2.5 % cream Apply topically 3 (three) times daily as needed. 28 g 3  . lactose free nutrition (BOOST) LIQD Take 237 mLs by mouth daily.    [name] levothyroxine (SYNTHROID, LEVOTHROID) 50 MCG tablet TAKE 2 TABLETS ON SUNDAY AND TAKE 1 TABLET ON OTHER DAYS (TAKE 8 PER WEEK TOTAL) 105 tablet 3  . loperamide (IMODIUM A-D) 2 MG tablet Take 2 mg by mouth as needed for diarrhea or loose stools.    . ondansetron (ZOFRAN) 8 MG tablet Take 1 tablet (8 mg total) by mouth every 8 (eight) hours as needed. for nausea 20  tablet 3  . potassium chloride SA (KLOR-CON M20) 20 MEQ tablet Take 1 tablet (20 mEq total) by mouth 2 (two) times daily. 180 tablet 3  . propranolol (INDERAL) 40 MG tablet Take 0.5 tablets (20 mg total) by mouth 2 (two) times daily. 90 tablet 3  . verapamil (CALAN-SR) 240 MG CR tablet Take 1 tablet (240 mg total) by mouth daily. 90 tablet 3  . vitamin C (ASCORBIC ACID) 500 MG tablet Take 500 mg by mouth daily.     No current facility-administered medications for this visit.    Allergies: No Known Allergies  Past Medical  History, Surgical history, Social history, and Family History were reviewed and updated.    Physical Exam: Blood pressure 127/66, pulse 81, temperature 98.1 F (36.7 C), temperature source Oral, resp. rate 16, height '5\' 6"'$  (1.676 m), weight 138 lb 4.8 oz (62.732 kg), SpO2 99 %. ECOG: 1 General appearance: Pleasant-appearing gentleman without distress. Head: Normocephalic, without obvious abnormality no oral thrush noted. Sclerae anicteric. Neck: no adenopathy, no masses or lesions. Lymph nodes: Cervical, supraclavicular, and axillary nodes normal. Heart:regular rate and rhythm, S1, S2. No murmurs rubs or gallops. Lung:chest clear, no wheezing, rales, no dullness to percussion. Chest wall examination: No tenderness or masses noted. Abdomen: soft, non-tender, without masses or organomegaly no dullness or ascites. EXT: Trace edema noted bilaterally. Skin: No erythema or induration noted.   Lab Results: Lab Results  Component Value Date   WBC 6.1 04/28/2016   HGB 11.6* 04/28/2016   HCT 36.5* 04/28/2016   MCV 105.2* 04/28/2016   PLT 139* 04/28/2016     CBC    Component Value Date/Time   WBC 6.1 04/28/2016 1401   WBC 5.7 04/02/2014 1416   RBC 3.47* 04/28/2016 1401   RBC 2.95* 04/02/2014 1416   HGB 11.6* 04/28/2016 1401   HGB 10.6* 04/02/2014 1416   HCT 36.5* 04/28/2016 1401   HCT 31.3* 04/02/2014 1416   PLT 139* 04/28/2016 1401   PLT 126.0* 04/02/2014 1416   MCV 105.2* 04/28/2016 1401   MCV 106.3* 04/02/2014 1416   MCH 33.4 04/28/2016 1401   MCH 27.2 05/03/2013 0900   MCHC 31.8* 04/28/2016 1401   MCHC 33.8 04/02/2014 1416   RDW 15.2* 04/28/2016 1401   RDW 15.4* 04/02/2014 1416   LYMPHSABS 2.6 04/28/2016 1401   LYMPHSABS 2.5 04/02/2014 1416   MONOABS 0.4 04/28/2016 1401   MONOABS 0.4 04/02/2014 1416   EOSABS 0.3 04/28/2016 1401   EOSABS 0.2 04/02/2014 1416   BASOSABS 0.0 04/28/2016 1401   BASOSABS 0.0 04/02/2014 3655      A 81 year old gentleman with the  following issues.   1. Colon cancer: He is status post a laparoscopic right hemicolectomy, with the pathology revealing a T3 N0, with 0 out of 21 lymph nodes involved. He did not receive adjuvant chemotherapy for colon cancer given that he has stage IV kidney cancer.  He is currently on active surveillance and has no signs or symptoms of cancer recurrence.  2. Renal cell cancer: he has stage IV disease with lung involvment. He is currently on Votrient dose reduced to 200 mg daily.  CT scan from 11/22/2015 showed clear progression of disease.   He has been on Cabometyx since December 2017. His dose has been reduced from 40 mg to 20 mg and have tolerated it much better. CT scan on 04/11/2017showed reasonable response to therapy. He has decrease in his lymphadenopathy as well as pulmonary nodules.   The plan is to  continue the same dose and schedule and repeat imaging studies in 2 months.   3. Weight loss: Likely related to Cabometyx. His weight improved slightly on the lower dose but seems to be declining again. We have discussed different strategies to maintain his weight moving forward. He will increase his nutritional supplements to twice a day.  4. Diarrhea. Seems to be manageable with the dose reduction.  5. Thrombocytopenia. Close to normal without any need for any intervention.  6. Nausea: Controlled by Zofran. No changes in his nausea at this time.  7. Pleural effusion/pneumothorax: small and asymptomatic. Appears to have clinically resolved at this time without any symptoms.  8. The transaminases surveillance: Have been within normal range and repeated today.  9. Followup: Will be in one month.       Zola Button, MD 5/9/20172:27 PM

## 2016-04-28 NOTE — Telephone Encounter (Signed)
Gave and printed appt sched and avs for pt for June °

## 2016-05-06 ENCOUNTER — Encounter: Payer: Self-pay | Admitting: Pharmacist

## 2016-05-06 NOTE — Progress Notes (Signed)
Oral Chemotherapy Follow-Up Form  Original Start date of oral chemotherapy: 12/04/15   Called patient today to follow up regarding patient's oral chemotherapy medication: Cabometyx  Pt is doing well today. No issues to report. No missed doses or side effects. Pt has been tolerating lower dose of Cabometyx 20 mg very well. He has been stable for several months. Will go ahead and discharge patient from follow up from the clinic but patient call call at anytime with issues or questions  Pt reports 0 tablets/doses missed in the last month.   Pt reports the following side effects: None to report   Pt discharged from follow up. He can call with any issues   Thank you,  Montel Clock, PharmD, [facility]

## 2016-05-21 ENCOUNTER — Other Ambulatory Visit: Payer: Self-pay | Admitting: *Deleted

## 2016-05-21 ENCOUNTER — Encounter: Payer: Self-pay | Admitting: *Deleted

## 2016-05-21 MED ORDER — CABOZANTINIB S-MALATE 20 MG PO TABS: 1.0000 | ORAL_TABLET | Freq: Every day | ORAL | Status: DC

## 2016-06-02 ENCOUNTER — Telehealth: Payer: Self-pay | Admitting: Oncology

## 2016-06-02 ENCOUNTER — Telehealth: Payer: Self-pay | Admitting: Family Medicine

## 2016-06-02 ENCOUNTER — Other Ambulatory Visit (HOSPITAL_BASED_OUTPATIENT_CLINIC_OR_DEPARTMENT_OTHER): Payer: Medicare Other

## 2016-06-02 ENCOUNTER — Ambulatory Visit (HOSPITAL_BASED_OUTPATIENT_CLINIC_OR_DEPARTMENT_OTHER): Payer: Medicare Other | Admitting: Oncology

## 2016-06-02 VITALS — BP 151/67 | HR 57 | Temp 97.8°F | Resp 17 | Wt 139.1 lb

## 2016-06-02 DIAGNOSIS — D696 Thrombocytopenia, unspecified: Secondary | ICD-10-CM

## 2016-06-02 DIAGNOSIS — Z85038 Personal history of other malignant neoplasm of large intestine: Secondary | ICD-10-CM

## 2016-06-02 DIAGNOSIS — C649 Malignant neoplasm of unspecified kidney, except renal pelvis: Secondary | ICD-10-CM | POA: Diagnosis not present

## 2016-06-02 DIAGNOSIS — R110 Nausea: Secondary | ICD-10-CM

## 2016-06-02 DIAGNOSIS — C7800 Secondary malignant neoplasm of unspecified lung: Secondary | ICD-10-CM

## 2016-06-02 LAB — COMPREHENSIVE METABOLIC PANEL
ALT: 22 U/L (ref 0–55)
AST: 18 U/L (ref 5–34)
Albumin: 2.7 g/dL — ABNORMAL LOW (ref 3.5–5.0)
Alkaline Phosphatase: 54 U/L (ref 40–150)
Anion Gap: 7 mEq/L (ref 3–11)
BILIRUBIN TOTAL: 0.39 mg/dL (ref 0.20–1.20)
BUN: 20.6 mg/dL (ref 7.0–26.0)
CHLORIDE: 111 meq/L — AB (ref 98–109)
CO2: 23 meq/L (ref 22–29)
Calcium: 8.1 mg/dL — ABNORMAL LOW (ref 8.4–10.4)
Creatinine: 1.1 mg/dL (ref 0.7–1.3)
EGFR: 66 mL/min/{1.73_m2} — AB (ref >90)
GLUCOSE: 86 mg/dL (ref 70–140)
POTASSIUM: 3.4 meq/L — AB (ref 3.5–5.1)
SODIUM: 141 meq/L (ref 136–145)
TOTAL PROTEIN: 6.1 g/dL — AB (ref 6.4–8.3)

## 2016-06-02 LAB — CBC WITH DIFFERENTIAL/PLATELET
BASO%: 0.2 % (ref 0.0–2.0)
Basophils Absolute: 0 10*3/uL (ref 0.0–0.1)
EOS%: 3.6 % (ref 0.0–7.0)
Eosinophils Absolute: 0.2 10*3/uL (ref 0.0–0.5)
HCT: 34.6 % — ABNORMAL LOW (ref 38.4–49.9)
HGB: 11.6 g/dL — ABNORMAL LOW (ref 13.0–17.1)
LYMPH#: 2.6 10*3/uL (ref 0.9–3.3)
LYMPH%: 42.4 % (ref 14.0–49.0)
MCH: 33.8 pg — ABNORMAL HIGH (ref 27.2–33.4)
MCHC: 33.5 g/dL (ref 32.0–36.0)
MCV: 100.9 fL — AB (ref 79.3–98.0)
MONO#: 0.5 10*3/uL (ref 0.1–0.9)
MONO%: 7.4 % (ref 0.0–14.0)
NEUT#: 2.9 10*3/uL (ref 1.5–6.5)
NEUT%: 46.4 % (ref 39.0–75.0)
Platelets: 114 10*3/uL — ABNORMAL LOW (ref 140–400)
RBC: 3.43 10*6/uL — AB (ref 4.20–5.82)
RDW: 14.7 % — AB (ref 11.0–14.6)
WBC: 6.2 10*3/uL (ref 4.0–10.3)

## 2016-06-02 NOTE — Telephone Encounter (Signed)
Electronic refill request. Patient not seen here in some time.  Looks like Oncology appts are most recent.  Please advise.

## 2016-06-02 NOTE — Telephone Encounter (Signed)
Gave and printed appt sched and avs for pt for July  °

## 2016-06-02 NOTE — Progress Notes (Signed)
Hematology and Oncology Follow Up Visit  Hunter Hardin [id number] [birth year] 81 y.o. 06/02/2016 3:07 PM   Principle Diagnosis: 81 year old gentleman with:  1. T3 N0 colon cancer: He is status post a laparoscopic right hemicolectomy, with 0 out of 21 lymph nodes involved. That was done on 03/06/2013.  2. Renal cell carcinoma diagnosed in May of 2014. He presented with kidney mass and lung metastasis. 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  Prior therapy: Started Votrient 800 mg daily on 05/22/2013. Dose was reduced to 600 mg daily on 01/19/14 due to weight loss and diarrhea. Then reduced to 400 mg starting on 09/07/2014. The dose will be reduced further to 200 mg daily starting on 11/16/2014. The dose reduction is related to diarrhea and weight loss. Therapy discontinued in December 2016 for disease progression.  Current therapy: Cabometyx 40 mg daily started in December 2016. This was changed to 20 mg daily in March 2017.  Interim History: Hunter Hardin presents today for a follow up visit by himself. Since the last visit, he reports no major changes in his health. He continues to be active and attends to activities of daily living. Continues to drive without any decline. He continues on Cabometyx without any dose interruption or delay. He reports periodic diarrhea which is managed by Imodium.   He denied any respiratory symptoms such as cough, wheezing or hemoptysis. He is eating better compared to previous times and maintained his weight.    He is not report any headaches, blurry vision or syncope. He denies chest pain or dyspnea on exertion. He denies abdominal pain. He does not report any hematochezia. He has not reported any pain in his hands or feet. Has not reported any fevers or chills or sweats.  He does not report any frequency urgency or hesitancy. Does not report any hematuria or dysuria. He does not report any pain  especially in any skeletal structures. He denies any back pain or hip pain. He is not report any lymphadenopathy or petechiae.  Remainder of his review of systems unremarkable.   Medications: I have reviewed the patient's current medications. Reviewed today and unchanged. Current Outpatient Prescriptions  Medication Sig Dispense Refill  . cabozantinib S-Malate (CABOMETYX) 20 MG TABS Take 1 tablet by mouth daily. 30 tablet 0  . doxazosin (CARDURA) 8 MG tablet TAKE 1 TABLET DAILY 90 tablet 3  . finasteride (PROSCAR) 5 MG tablet Take 5 mg by mouth daily.    . fish oil-omega-3 fatty acids 1000 MG capsule Take 1 g by mouth 2 (two) times daily.    . hydrochlorothiazide (HYDRODIURIL) 25 MG tablet Take 1 tablet (25 mg total) by mouth daily. 90 tablet 3  . hydrocortisone 2.5 % cream Apply topically 3 (three) times daily as needed. 28 g 3  . lactose free nutrition (BOOST) LIQD Take 237 mLs by mouth daily.    [name] levothyroxine (SYNTHROID, LEVOTHROID) 50 MCG tablet TAKE 2 TABLETS ON SUNDAY AND TAKE 1 TABLET ON OTHER DAYS (TAKE 8 PER WEEK TOTAL) 105 tablet 3  . loperamide (IMODIUM A-D) 2 MG tablet Take 2 mg by mouth as needed for diarrhea or loose stools.    . ondansetron (ZOFRAN) 8 MG tablet Take 1 tablet (8 mg total) by mouth every 8 (eight) hours as needed. for nausea 20 tablet 3  . potassium chloride SA (KLOR-CON M20) 20 MEQ tablet Take 1 tablet (20 mEq total) by mouth 2 (  two) times daily. 180 tablet 3  . propranolol (INDERAL) 40 MG tablet Take 0.5 tablets (20 mg total) by mouth 2 (two) times daily. 90 tablet 3  . verapamil (CALAN-SR) 240 MG CR tablet Take 1 tablet (240 mg total) by mouth daily. 90 tablet 3  . vitamin C (ASCORBIC ACID) 500 MG tablet Take 500 mg by mouth daily.     No current facility-administered medications for this visit.    Allergies: No Known Allergies  Past Medical History, Surgical history, Social history, and Family History were reviewed and updated.    Physical  Exam: Blood pressure 151/67, pulse 57, temperature 97.8 F (36.6 C), temperature source Oral, resp. rate 17, weight 139 lb 1.6 oz (63.095 kg), SpO2 98 %. ECOG: 1 General appearance: Well-appearing gentleman without distress. Head: Normocephalic, without obvious abnormality no oral ulcers or lesions. Neck: no adenopathy, no masses or lesions. Lymph nodes: Cervical, supraclavicular, and axillary nodes normal. Heart:regular rate and rhythm, S1, S2. No murmurs rubs or gallops. Lung:chest clear, no wheezing, rales, no dullness to percussion. Chest wall examination: No tenderness or masses noted. Abdomen: soft, non-tender, without masses or organomegaly no shifting dullness or ascites. EXT: Edema noted to the level of shin about 1+. Skin: No erythema or induration noted.   Lab Results: Lab Results  Component Value Date   WBC 6.2 06/02/2016   HGB 11.6* 06/02/2016   HCT 34.6* 06/02/2016   MCV 100.9* 06/02/2016   PLT 114* 06/02/2016     CBC    Component Value Date/Time   WBC 6.2 06/02/2016 1446   WBC 5.7 04/02/2014 1416   RBC 3.43* 06/02/2016 1446   RBC 2.95* 04/02/2014 1416   HGB 11.6* 06/02/2016 1446   HGB 10.6* 04/02/2014 1416   HCT 34.6* 06/02/2016 1446   HCT 31.3* 04/02/2014 1416   PLT 114* 06/02/2016 1446   PLT 126.0* 04/02/2014 1416   MCV 100.9* 06/02/2016 1446   MCV 106.3* 04/02/2014 1416   MCH 33.8* 06/02/2016 1446   MCH 27.2 05/03/2013 0900   MCHC 33.5 06/02/2016 1446   MCHC 33.8 04/02/2014 1416   RDW 14.7* 06/02/2016 1446   RDW 15.4* 04/02/2014 1416   LYMPHSABS 2.6 06/02/2016 1446   LYMPHSABS 2.5 04/02/2014 1416   MONOABS 0.5 06/02/2016 1446   MONOABS 0.4 04/02/2014 1416   EOSABS 0.2 06/02/2016 1446   EOSABS 0.2 04/02/2014 1416   BASOSABS 0.0 06/02/2016 1446   BASOSABS 0.0 04/02/2014 6018      A 81 year old gentleman with the following issues.   1. Colon cancer: He is status post a laparoscopic right hemicolectomy, with the pathology revealing a T3 N0,  with 0 out of 21 lymph nodes involved. He did not receive adjuvant chemotherapy for colon cancer given that he has stage IV kidney cancer.  He is currently on active surveillance and has no signs or symptoms of cancer recurrence.  2. Renal cell cancer: he has stage IV disease with lung involvment. He is currently on Votrient dose reduced to 200 mg daily.  CT scan from 11/22/2015 showed clear progression of disease.   He has been on Cabometyx since December 2017. His dose has been reduced from 40 mg to 20 mg and have tolerated it much better. CT scan on 04/11/2017showed reasonable response to therapy. He has decrease in his lymphadenopathy as well as pulmonary nodules.   The plan is to continue with the same dose and schedule of this medication and repeat imaging studies in August 2017.   3. Weight  loss: Likely related to Cabometyx. His weight improved slightly and he is eating better since the dose reduction.  4. Diarrhea. Seems to be manageable with the dose reduction. He does require Imodium periodically.  5. Thrombocytopenia. Close to normal without any need for any intervention.  6. Nausea: Controlled by Zofran. No changes in his nausea at this time.  7. Pleural effusion/pneumothorax: small and asymptomatic. Appears to have clinically resolved at this time. We will repeat imaging studies in the next 2 months.  8. The transaminases surveillance: Have been within normal range and repeated today.  9. Followup: Will be in one month.       [facility], MD 6/13/20173:07 PM

## 2016-06-03 NOTE — Telephone Encounter (Signed)
Left message on answering machine at home number and cell to call back. 

## 2016-06-03 NOTE — Telephone Encounter (Signed)
Sent.  I'd like to see him at some point in the next few months, when possible and most convenient for patient. This isn't a rush.  Thanks.

## 2016-06-04 NOTE — Telephone Encounter (Signed)
Left message on patient's voicemail to return call

## 2016-06-04 NOTE — Telephone Encounter (Signed)
Pt returned your call.  

## 2016-06-04 NOTE — Telephone Encounter (Signed)
Patient advised. Appt scheduled. 

## 2016-06-14 ENCOUNTER — Other Ambulatory Visit: Payer: Self-pay | Admitting: Family Medicine

## 2016-06-14 DIAGNOSIS — E039 Hypothyroidism, unspecified: Secondary | ICD-10-CM

## 2016-06-14 DIAGNOSIS — E785 Hyperlipidemia, unspecified: Secondary | ICD-10-CM

## 2016-06-15 ENCOUNTER — Other Ambulatory Visit: Payer: Self-pay | Admitting: Family Medicine

## 2016-06-16 ENCOUNTER — Ambulatory Visit (INDEPENDENT_AMBULATORY_CARE_PROVIDER_SITE_OTHER): Payer: Medicare Other | Admitting: Family Medicine

## 2016-06-16 ENCOUNTER — Encounter: Payer: Self-pay | Admitting: Family Medicine

## 2016-06-16 ENCOUNTER — Ambulatory Visit (INDEPENDENT_AMBULATORY_CARE_PROVIDER_SITE_OTHER): Payer: Medicare Other

## 2016-06-16 VITALS — BP 120/78 | HR 54 | Temp 97.6°F | Ht 64.5 in | Wt 139.0 lb

## 2016-06-16 DIAGNOSIS — I10 Essential (primary) hypertension: Secondary | ICD-10-CM

## 2016-06-16 DIAGNOSIS — Z0000 Encounter for general adult medical examination without abnormal findings: Secondary | ICD-10-CM

## 2016-06-16 DIAGNOSIS — K4090 Unilateral inguinal hernia, without obstruction or gangrene, not specified as recurrent: Secondary | ICD-10-CM

## 2016-06-16 DIAGNOSIS — Z7189 Other specified counseling: Secondary | ICD-10-CM

## 2016-06-16 DIAGNOSIS — C189 Malignant neoplasm of colon, unspecified: Secondary | ICD-10-CM

## 2016-06-16 DIAGNOSIS — E039 Hypothyroidism, unspecified: Secondary | ICD-10-CM

## 2016-06-16 DIAGNOSIS — R609 Edema, unspecified: Secondary | ICD-10-CM | POA: Diagnosis not present

## 2016-06-16 DIAGNOSIS — J90 Pleural effusion, not elsewhere classified: Secondary | ICD-10-CM

## 2016-06-16 DIAGNOSIS — E785 Hyperlipidemia, unspecified: Secondary | ICD-10-CM

## 2016-06-16 NOTE — Progress Notes (Signed)
PCP notes  Health maintenance:   Colon cancer screening - will discuss with PCP at next appt  Abnormal Screenings: None  Patient concerns: Pt feels he has a "hernia" in lower left quadrant. PCP notified.  Nurse concerns: None  Next PCP appt: 06/16/2016 @ 1400  I reviewed health advisor's note, was available for consultation on the day of service listed in this note, and agree with documentation and plan. Elsie Stain, MD.

## 2016-06-16 NOTE — Patient Instructions (Signed)
Hunter Hardin , Thank you for taking time to come for your Medicare Wellness Visit. I appreciate your ongoing commitment to your health goals. Please review the following plan we discussed and let me know if I can assist you in the future.   These are the goals we discussed: Goals    . Increase physical activity     Starting 06/16/2016, I will continue to walk at least 60 min 3 days per week.        This is a list of the screening recommended for you and due dates:  Health Maintenance  Topic Date Due  . Colon Cancer Screening  06/16/2017*  . Flu Shot  07/21/2016  . Tetanus Vaccine  01/12/2021  . Shingles Vaccine  Completed  . Pneumonia vaccines  Completed  *Topic was postponed. The date shown is not the original due date.    Preventive Care for Adults  A healthy lifestyle and preventive care can promote health and wellness. Preventive health guidelines for adults include the following key practices.  . A routine yearly physical is a good way to check with your health care provider about your health and preventive screening. It is a chance to share any concerns and updates on your health and to receive a thorough exam.  . Visit your dentist for a routine exam and preventive care every 6 months. Brush your teeth twice a day and floss once a day. Good oral hygiene prevents tooth decay and gum disease.  . The frequency of eye exams is based on your age, health, family medical history, use  of contact lenses, and other factors. Follow your health care provider's ecommendations for frequency of eye exams.  . Eat a healthy diet. Foods like vegetables, fruits, whole grains, low-fat dairy products, and lean protein foods contain the nutrients you need without too many calories. Decrease your intake of foods high in solid fats, added sugars, and salt. Eat the right amount of calories for you. Get information about a proper diet from your health care provider, if necessary.  . Regular  physical exercise is one of the most important things you can do for your health. Most adults should get at least 150 minutes of moderate-intensity exercise (any activity that increases your heart rate and causes you to sweat) each week. In addition, most adults need muscle-strengthening exercises on 2 or more days a week.  Silver Sneakers may be a benefit available to you. To determine eligibility, you may visit the website: www.silversneakers.com or contact program at (564)591-4365 Mon-Fri between 8AM-8PM.   . Maintain a healthy weight. The body mass index (BMI) is a screening tool to identify possible weight problems. It provides an estimate of body fat based on height and weight. Your health care provider can find your BMI and can help you achieve or maintain a healthy weight.   For adults 20 years and older: ? A BMI below 18.5 is considered underweight. ? A BMI of 18.5 to 24.9 is normal. ? A BMI of 25 to 29.9 is considered overweight. ? A BMI of 30 and above is considered obese.   . Maintain normal blood lipids and cholesterol levels by exercising and minimizing your intake of saturated fat. Eat a balanced diet with plenty of fruit and vegetables. Blood tests for lipids and cholesterol should begin at age 73 and be repeated every 5 years. If your lipid or cholesterol levels are high, you are over 50, or you are at high risk for heart disease,  you may need your cholesterol levels checked more frequently. Ongoing high lipid and cholesterol levels should be treated with medicines if diet and exercise are not working.  . If you smoke, find out from your health care provider how to quit. If you do not use tobacco, please do not start.  . If you choose to drink alcohol, please do not consume more than 2 drinks per day. One drink is considered to be 12 ounces (355 mL) of beer, 5 ounces (148 mL) of wine, or 1.5 ounces (44 mL) of liquor.  . If you are 55-56 years old, ask your health care provider if  you should take aspirin to prevent strokes.  . Use sunscreen. Apply sunscreen liberally and repeatedly throughout the day. You should seek shade when your shadow is shorter than you. Protect yourself by wearing long sleeves, pants, a wide-brimmed hat, and sunglasses year round, whenever you are outdoors.  . Once a month, do a whole body skin exam, using a mirror to look at the skin on your back. Tell your health care provider of new moles, moles that have irregular borders, moles that are larger than a pencil eraser, or moles that have changed in shape or color.

## 2016-06-16 NOTE — Progress Notes (Signed)
Ongoing treatment for renal CA with mets.  Diarrhea resolved with current dose of chemo.  No ADE with current meds.   Hypertension:    Using medication without problems or lightheadedness: yes Chest pain with exertion:no Edema:no Short of breath: occ, with known pleural effusion prev imaged.  He has more sx sleeping on the L side, does well sleeping on the R side.  Some cough noted. He isn't SOB upright and when flat on his back.    D/w pt about colon cancer f/u.  At this point, likely not needing f/u colonoscopy given his ongoing treatment.  D/w pt about his letter from GI.    Hypothyroidism.  No ADE on med.  No neck mass.  Energy level is fair.    If incapacitated, would have his sister Marisa Severin designated until either his son or daughter could be contacted/available.   He has a fall alert button.    Possible LIH.  Wanted eval.  Will occ "pooch" out.  About the size of a tennis ball.  No sx sitting or laying.  Noted walking or standing.  Noted a few weeks ago.  Not painful.    Meds, vitals, and allergies reviewed.   ROS: Per HPI unless specifically indicated in ROS section   GEN: nad, alert and oriented HEENT: mucous membranes moist NECK: supple w/o LA CV: rrr.  no murmur PULM: ctab except for slight dec in BS at the LLL, no inc wob ABD: soft, +bs EXT: trace BLE edema SKIN: no acute rash Obvious soft LIH noted, easily reduced.

## 2016-06-16 NOTE — Patient Instructions (Addendum)
Please ask them to get the other labs (thyroid and lipid) with Dr. Hazeline Junker next set of labs.   Don't change your meds for now.  If the hernia gets painful, then let us know.  I would avoid heavy lifting in the meantime.  Take care.  Glad to see you.

## 2016-06-16 NOTE — Progress Notes (Signed)
Subjective:   Hunter Hardin. is a 81 y.o. male who presents for Medicare Annual/Subsequent preventive examination.  Review of Systems:  N/A Cardiac Risk Factors include: advanced age (>8mn, >>86women);male gender;dyslipidemia;hypertension     Objective:    Vitals: BP 120/78 mmHg  Pulse 54  Temp(Src) 97.6 F (36.4 C) (Oral)  Ht 5' 4.5" (1.638 m)  Wt 139 lb (63.05 kg)  BMI 23.50 kg/m2  SpO2 97%  Body mass index is 23.5 kg/(m^2).  Tobacco History  Smoking status  . Former Smoker -- 2.00 packs/day for 30 years  . Types: Cigarettes  . Quit date: 12/21/1985  Smokeless tobacco  . Never Used     Counseling given: No   Past Medical History  Diagnosis Date  . Hypothyroidism   . Hypertension   . HLD (hyperlipidemia)   . Elevated PSA     H/O  . GERD (gastroesophageal reflux disease)   . Protein calorie [place] ([place] 06/13/2013  . renal ca dx'd 01/2013  . [place]  . Colon cancer ([place] dx[place] 01/2013   Past Surgical History  Procedure Laterality Date  . Cholecystectomy  early 934's . Vasectomy    . Colonoscopy  01/2013  . Laparoscopic partial colectomy N/A 03/06/2013    Procedure: LAPAROSCOPic assisted right hemi COLECTOMY;  Surgeon: ALeighton Ruff MD;  Location: [facility];  Service: General;  Laterality: N/A;   Family History  Problem Relation Age of Onset  . Hypertension Mother   . Hyperlipidemia Mother   . Dementia Mother   . Heart disease Father     ? MI, CAD  . Diabetes Cousin   . Cancer Brother     oral/tonsil cancer 2012  . Colon cancer Neg Hx   . Prostate cancer Neg Hx   . Cancer Sister     breast cancer   History  Sexual Activity  . Sexual Activity: No    Outpatient Encounter Prescriptions as of 06/16/2016  Medication Sig  . cabozantinib S-Malate (CABOMETYX) 20 MG TABS Take 1 tablet by mouth daily.  .Marland Kitchendoxazosin (CARDURA) 8 MG tablet TAKE 1 TABLET DAILY  . finasteride (PROSCAR) 5 MG tablet Take 5 mg by mouth  daily.  . fish oil-omega-3 fatty acids 1000 MG capsule Take 1 g by mouth 2 (two) times daily.  . hydrochlorothiazide (HYDRODIURIL) 25 MG tablet TAKE 1 TABLET DAILY  . hydrocortisone 2.5 % cream Apply topically 3 (three) times daily as needed.  . lactose free nutrition (BOOST) LIQD Take 237 mLs by mouth daily.  .Marland Kitchenlevothyroxine (SYNTHROID, LEVOTHROID) 50 MCG tablet TAKE 2 TABLETS ON SUNDAY AND TAKE 1 TABLET ON OTHER DAYS (TAKE 8 PER WEEK TOTAL)  . loperamide (IMODIUM A-D) 2 MG tablet Take 2 mg by mouth as needed for diarrhea or loose stools.  . ondansetron (ZOFRAN) 8 MG tablet Take 1 tablet (8 mg total) by mouth every 8 (eight) hours as needed. for nausea  . potassium chloride SA (KLOR-CON M20) 20 MEQ tablet Take 1 tablet (20 mEq total) by mouth 2 (two) times daily.  . propranolol (INDERAL) 40 MG tablet Take 0.5 tablets (20 mg total) by mouth 2 (two) times daily.  . verapamil (CALAN-SR) 240 MG CR tablet TAKE 1 TABLET DAILY  . vitamin C (ASCORBIC ACID) 500 MG tablet Take 500 mg by mouth daily.   No facility-administered encounter medications on file as of 06/16/2016.    Activities of Daily Living In your present state of health, do  you have any difficulty performing the following activities: 06/16/2016  Hearing? Y  Vision? N  Difficulty concentrating or making decisions? N  Walking or climbing stairs? N  Dressing or bathing? N  Doing errands, shopping? N  Preparing Food and eating ? N  Using the Toilet? N  In the past six months, have you accidently leaked urine? N  Do you have problems with loss of bowel control? N  Managing your Medications? N  Managing your Finances? N  Housekeeping or managing your Housekeeping? N    Patient Care Team: Tonia Ghent, MD as PCP - General (Family Medicine)   Assessment:    Hearing Screening Comments: Pt wears bilateral hearing aids Vision Screening Comments: Last appt with Dr. Sherran Needs on April 13, 2016  Exercise Activities and Dietary  recommendations Current Exercise Habits: Home exercise routine, Type of exercise: walking, Time (Minutes): 60, Frequency (Times/Week): 3, Weekly Exercise (Minutes/Week): 180, Intensity: Mild, Exercise limited by: None identified  Goals    . Increase physical activity     Starting 06/16/2016, I will continue to walk at least 60 min 3 days per week.       Fall Risk Fall Risk  06/16/2016 06/16/2016 12/17/2014 11/16/2014 10/12/2014  Falls in the past year? No No No No No   Depression Screen PHQ 2/9 Scores 06/16/2016 06/16/2016  PHQ - 2 Score 0 0    Cognitive Testing MMSE - Mini Mental State Exam 06/16/2016  Orientation to time 5  Orientation to Place 5  Registration 3  Attention/ Calculation 0  Recall 3  Language- name 2 objects 0  Language- repeat 1  Language- follow 3 step command 3  Language- read & follow direction 0  Write a sentence 0  Copy design 0  Total score 20   PLEASE NOTE: A Mini-Cog screen was completed. Maximum score is 20. A value of 0 denotes this part of Folstein MMSE was not completed or the patient failed this part of the Mini-Cog screening.   Mini-Cog Screening Orientation to Time - Max 5 pts Orientation to Place - Max 5 pts Registration - Max 3 pts Recall - Max 3 pts Language Repeat - Max 1 pts Language Follow 3 Step Command - Max 3 pts   Immunization History  Administered Date(s) Administered  . Influenza Whole 09/21/2003, 09/22/2007, 09/10/2009, 10/01/2010, 09/12/2012  . Influenza, High Dose Seasonal PF 10/06/2013  . Influenza-Unspecified 09/26/2014  . Pneumococcal Conjugate-13 06/19/2015  . Pneumococcal Polysaccharide-23 01/21/2005  . Td 12/22/1999, 01/12/2011  . Zoster 12/08/2011   Screening Tests Health Maintenance  Topic Date Due  . COLONOSCOPY  06/16/2017 (Originally 01/25/2014)  . INFLUENZA VACCINE  07/21/2016  . TETANUS/TDAP  01/12/2021  . ZOSTAVAX  Completed  . PNA vac Low Risk Adult  Completed      Plan:     I have personally  reviewed and addressed the Medicare Annual Wellness questionnaire and have noted the following in the patient's chart:  A. Medical and social history B. Use of alcohol, tobacco or illicit drugs  C. Current medications and supplements D. Functional ability and status E.  Nutritional status F.  Physical activity G. Advance directives H. List of other physicians I.  Hospitalizations, surgeries, and ER visits in previous 12 months J.  [place] to include hearing, vision, cognitive, depression L. Referrals and appointments - none  In addition, I have reviewed and discussed with patient certain preventive protocols, quality metrics, and best practice recommendations. A written personalized care plan for  preventive services as well as general preventive health recommendations were provided to patient.  See attached scanned questionnaire for additional information.   Signed,   Lindell Noe, MHA, BS, LPN Health Advisor

## 2016-06-16 NOTE — Progress Notes (Signed)
Pre visit review using our clinic review tool, if applicable. No additional management support is needed unless otherwise documented below in the visit note. 

## 2016-06-17 DIAGNOSIS — J90 Pleural effusion, not elsewhere classified: Secondary | ICD-10-CM | POA: Insufficient documentation

## 2016-06-17 DIAGNOSIS — K4090 Unilateral inguinal hernia, without obstruction or gangrene, not specified as recurrent: Secondary | ICD-10-CM | POA: Insufficient documentation

## 2016-06-17 NOTE — Assessment & Plan Note (Signed)
He has known pleural effuse, but wo sig sx it wouldn't likely be reasonable to intervene o/w.  D/w pt.  He agrees .

## 2016-06-17 NOTE — Assessment & Plan Note (Signed)
Mild at this point, would follow clinically.

## 2016-06-17 NOTE — Assessment & Plan Note (Signed)
Wouldn't be reasonable to operate w/o sig pain.  He doesn't have pain, so would only observe for now.  He'll update me as needed.  Anatomy d/w pt.

## 2016-06-17 NOTE — Assessment & Plan Note (Signed)
Will ask for recheck TSH with next set of labs.  >25 minutes spent in face to face time with patient, >50% spent in counselling or coordination of care.

## 2016-06-17 NOTE — Assessment & Plan Note (Signed)
If incapacitated, would have his sister Marisa Severin designated until either his son or daughter could be contacted/available.

## 2016-06-17 NOTE — Assessment & Plan Note (Signed)
Would continue as is for now.

## 2016-06-17 NOTE — Assessment & Plan Note (Signed)
Will ask for recheck lipids with next set of labs.

## 2016-06-26 NOTE — Progress Notes (Signed)
Received fax from [facility] as to the status of Hunter Hardin Cabometyx 20 mg.  The form is to update his status with the drug as to his dose, continuation, and refills.  I have filled out most of the form and we just need Dr Hazeline Junker signature and to fax back.  Form left on Oral Oncology Navigator's desk to get signed upon Dr. Hazeline Junker return from [facility] on 06/29/16.  Thank you  Hunter Hardin, PharmD [facility]

## 2016-06-29 ENCOUNTER — Encounter: Payer: Self-pay | Admitting: Pharmacist

## 2016-06-29 NOTE — Progress Notes (Signed)
I obtained Dr. Hazeline Junker signature this AM for Cabometyx refill. I faxed to [facility] [phone] without success.  No connectivity despite several attempts and redials.  I called [facility] (ph# [phone]) and was given a different fax# [phone].  I got a fax confirmation and was told to call back in 1 hour to make sure the fax was received.  I called 2 hours later to give them more time and was told they did not get the fax yet but it could take up to 24 hours.  I was given a 3rd fax# [phone] and I faxed the refill to that number and also got a confirmation.  I will call tomorrow (7/11) and make sure they got the RX. Kennith Center, Pharm.D., CPP 06/29/2016'@4'$ :[facility]

## 2016-07-01 ENCOUNTER — Telehealth: Payer: Self-pay | Admitting: Pharmacist

## 2016-07-01 NOTE — Telephone Encounter (Signed)
I s/w [facility] and they did receive the faxed refill request I sent several times on 06/29/16 and are starting to process the Rx. They will contact pt to arrange delivery. Kennith Center, Pharm.D., CPP 07/01/2016'@10'$ :02 [facility]

## 2016-07-06 DIAGNOSIS — R338 Other retention of urine: Secondary | ICD-10-CM | POA: Diagnosis not present

## 2016-07-06 DIAGNOSIS — N401 Enlarged prostate with lower urinary tract symptoms: Secondary | ICD-10-CM | POA: Diagnosis not present

## 2016-07-09 ENCOUNTER — Ambulatory Visit (HOSPITAL_BASED_OUTPATIENT_CLINIC_OR_DEPARTMENT_OTHER): Payer: Medicare Other | Admitting: Oncology

## 2016-07-09 ENCOUNTER — Other Ambulatory Visit (HOSPITAL_BASED_OUTPATIENT_CLINIC_OR_DEPARTMENT_OTHER): Payer: Medicare Other

## 2016-07-09 ENCOUNTER — Telehealth: Payer: Self-pay | Admitting: Oncology

## 2016-07-09 DIAGNOSIS — Z85038 Personal history of other malignant neoplasm of large intestine: Secondary | ICD-10-CM | POA: Diagnosis not present

## 2016-07-09 DIAGNOSIS — C7800 Secondary malignant neoplasm of unspecified lung: Secondary | ICD-10-CM

## 2016-07-09 DIAGNOSIS — C649 Malignant neoplasm of unspecified kidney, except renal pelvis: Secondary | ICD-10-CM

## 2016-07-09 DIAGNOSIS — C189 Malignant neoplasm of colon, unspecified: Secondary | ICD-10-CM

## 2016-07-09 LAB — CBC WITH DIFFERENTIAL/PLATELET
BASO%: 0.1 % (ref 0.0–2.0)
BASOS ABS: 0 10*3/uL (ref 0.0–0.1)
EOS ABS: 0.3 10*3/uL (ref 0.0–0.5)
EOS%: 3.7 % (ref 0.0–7.0)
HCT: 38.1 % — ABNORMAL LOW (ref 38.4–49.9)
HGB: 13 g/dL (ref 13.0–17.1)
LYMPH%: 34.5 % (ref 14.0–49.0)
MCH: 33.2 pg (ref 27.2–33.4)
MCHC: 34.1 g/dL (ref 32.0–36.0)
MCV: 97.4 fL (ref 79.3–98.0)
MONO#: 0.5 10*3/uL (ref 0.1–0.9)
MONO%: 6.8 % (ref 0.0–14.0)
NEUT%: 54.9 % (ref 39.0–75.0)
NEUTROS ABS: 4 10*3/uL (ref 1.5–6.5)
PLATELETS: 127 10*3/uL — AB (ref 140–400)
RBC: 3.91 10*6/uL — ABNORMAL LOW (ref 4.20–5.82)
RDW: 15.7 % — ABNORMAL HIGH (ref 11.0–14.6)
WBC: 7.4 10*3/uL (ref 4.0–10.3)
lymph#: 2.5 10*3/uL (ref 0.9–3.3)

## 2016-07-09 LAB — COMPREHENSIVE METABOLIC PANEL
ALT: 54 U/L (ref 0–55)
ANION GAP: 8 meq/L (ref 3–11)
AST: 50 U/L — ABNORMAL HIGH (ref 5–34)
Albumin: 2.5 g/dL — ABNORMAL LOW (ref 3.5–5.0)
Alkaline Phosphatase: 67 U/L (ref 40–150)
BILIRUBIN TOTAL: 0.48 mg/dL (ref 0.20–1.20)
BUN: 17 mg/dL (ref 7.0–26.0)
CO2: 26 meq/L (ref 22–29)
Calcium: 7.8 mg/dL — ABNORMAL LOW (ref 8.4–10.4)
Chloride: 107 mEq/L (ref 98–109)
Creatinine: 0.9 mg/dL (ref 0.7–1.3)
EGFR: 80 mL/min/{1.73_m2} — AB (ref >90)
Glucose: 93 mg/dl (ref 70–140)
Potassium: 3.2 mEq/L — ABNORMAL LOW (ref 3.5–5.1)
Sodium: 142 mEq/L (ref 136–145)
TOTAL PROTEIN: 6.3 g/dL — AB (ref 6.4–8.3)
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
## 2016-07-09 NOTE — Telephone Encounter (Signed)
Gave pt cal & avs °

## 2016-07-13 DIAGNOSIS — R338 Other retention of urine: Secondary | ICD-10-CM | POA: Diagnosis not present

## 2016-07-14 DIAGNOSIS — R338 Other retention of urine: Secondary | ICD-10-CM | POA: Diagnosis not present

## 2016-07-14 DIAGNOSIS — N401 Enlarged prostate with lower urinary tract symptoms: Secondary | ICD-10-CM | POA: Diagnosis not present

## 2016-07-21 ENCOUNTER — Telehealth: Payer: Self-pay | Admitting: *Deleted

## 2016-07-21 ENCOUNTER — Encounter: Payer: Self-pay | Admitting: *Deleted

## 2016-07-21 DIAGNOSIS — Z85038 Personal history of other malignant neoplasm of large intestine: Secondary | ICD-10-CM | POA: Diagnosis not present

## 2016-07-21 NOTE — Telephone Encounter (Signed)
No note

## 2016-07-21 NOTE — Telephone Encounter (Signed)
Patient calling to say he has a lab appt, but no scan appt. Called central scheduling. Scans have not been prior authorized. Called christina woods at [facility] managed care, she got scans authorized in 10 minutes, notified patient of date, time and to be npo after midnight the night before. Patient verbalized understanding.

## 2016-07-30 DIAGNOSIS — N401 Enlarged prostate with lower urinary tract symptoms: Secondary | ICD-10-CM | POA: Diagnosis not present

## 2016-07-30 DIAGNOSIS — C642 Malignant neoplasm of left kidney, except renal pelvis: Secondary | ICD-10-CM | POA: Diagnosis not present

## 2016-07-30 DIAGNOSIS — R3912 Poor urinary stream: Secondary | ICD-10-CM | POA: Diagnosis not present

## 2016-07-30 DIAGNOSIS — R330 Drug induced retention of urine: Secondary | ICD-10-CM | POA: Diagnosis not present

## 2016-08-04 ENCOUNTER — Encounter: Payer: Self-pay | Admitting: *Deleted

## 2016-08-04 ENCOUNTER — Ambulatory Visit (INDEPENDENT_AMBULATORY_CARE_PROVIDER_SITE_OTHER): Payer: Medicare Other | Admitting: Internal Medicine

## 2016-08-04 ENCOUNTER — Encounter: Payer: Self-pay | Admitting: Internal Medicine

## 2016-08-04 VITALS — BP 140/66 | HR 64 | Ht 64.5 in | Wt 139.0 lb

## 2016-08-04 DIAGNOSIS — K648 Other hemorrhoids: Secondary | ICD-10-CM | POA: Diagnosis not present

## 2016-08-04 DIAGNOSIS — Z85038 Personal history of other malignant neoplasm of large intestine: Secondary | ICD-10-CM | POA: Diagnosis not present

## 2016-08-04 NOTE — Patient Instructions (Signed)
Please purchase Tucks Pads over the counter and use.  Contact our office should your hemorrhoidal symptoms continue.  If you are age 79 or older, your body mass index should be between 23-30. Your Body mass index is 23.49 kg/m. If this is out of the aforementioned range listed, please consider follow up with your Primary Care Provider.  If you are age 63 or younger, your body mass index should be between 19-25. Your Body mass index is 23.49 kg/m. If this is out of the aformentioned range listed, please consider follow up with your Primary Care Provider.

## 2016-08-06 NOTE — Progress Notes (Signed)
Subjective:    Patient ID: Hunter Balloon., male    DOB: 05-14-1935, 81 y.o.   MRN: [id number]  HPI Hunter Hardin is a 81 year old male with a past medical history of T3 N0 right-sided colon cancer status post right hemicolectomy without lymph node involvement (March 2014), metastatic renal cell carcinoma diagnosed in May 2014 who seen for follow-up. He was having issues with swelling and prolapsed hemorrhoids several months ago. He's had on and off loose stools since his colon surgery. He has a bowel movement 3-4 times per day. He does not currently have issues with abdominal pain. He denies blood in his stool or melena. Occasionally he has nausea relieved by Zofran. He's been using over-the-counter Tucks medicated pads for his hemorrhoids and this has worked very well. Over the last few weeks to a month hemorrhoid symptoms have completely resolved and he denies issues or problems today. Overall appetite has been fair for him. He had issues with urinary retention requiring Foley catheter insertion. Energy level for him has been also fair. As fever, chills, chest pain and shortness of breath.  His last colonoscopy was at the time of diagnosis of colon cancer on 01/25/2013. This revealed tumor at the ileocecal valve, sessile polyp in the ascending colon, moderate diverticulosis and internal hemorrhoids.  He has had surveillance CT scans last in April 2017. This shows partial treatment response with decreased left pericardiophrenic mediastinal adenopathy and stable mild lateral hilar adenopathy. There is decreased lung and left pleural metastasis and decreased left renal tumor. There was moderate to large left hydropneumothorax which had a new small left pneumothorax component. A small right pleural effusion. Three-vessel coronary artery disease, mild sigmoid diverticulosis and prostatomegaly.  Review of Systems As per history of present illness, otherwise negative  Current Medications, Allergies,  Past Medical History, Past Surgical History, Family History and Social History were reviewed in [employer] record.     Objective:   Physical Exam BP 140/66 (BP Location: Left Arm, Patient Position: Sitting, Cuff Size: Normal)   Pulse 64   Ht 5' 4.5" (1.638 m)   Wt 139 lb (63 kg)   BMI 23.49 kg/m  Constitutional: Well-developed and well-nourished. No distress. HEENT: Normocephalic and atraumatic. Oropharynx is clear and moist. No oropharyngeal exudate. Conjunctivae are normal.  No scleral icterus. Neck: Neck supple. Trachea midline. Cardiovascular: Normal rate, regular rhythm and intact distal pulses. No M/R/G Pulmonary/chest: Effort normal and breath sounds normal. No wheezing, rales or rhonchi. Abdominal: Soft, nontender, nondistended. Bowel sounds active throughout.  Extremities: no clubbing, cyanosis, or edema Lymphadenopathy: No cervical adenopathy noted. Neurological: Alert and oriented to person place and time. Skin: Skin is warm and dry. No rashes noted. Psychiatric: Normal mood and affect. Behavior is normal.        Assessment & Plan:  81 year old male with a past medical history of T3 N0 right-sided colon cancer status post right hemicolectomy without lymph node involvement (March 2014), metastatic renal cell carcinoma diagnosed in May 2014 who seen for follow-up.  1. Symptomatic hemorrhoids -- improved with over-the-counter hemorrhoidal medicated pads. Not bothering him today. I asked that he notify me if hemorrhoid symptoms return and we can recommend other medical therapy and if refractory hemorrhoidal banding. He voices understanding and is happy with this plan  2. History of colon cancer -- history of right-sided colon cancer without lymph node involvement. He follows closely with Dr. Alen Blew. Given his metastatic renal cancer, I recommended against surveillance colonoscopy at present. I  will copy Dr. Alen Blew to be sure he is in agreement.    3.  History of metastatic renal cell cancer -- per Dr. Alen Blew  15 minutes spent with the patient today. Greater than 50% was spent in counseling and coordination of care with the patient

## 2016-08-10 ENCOUNTER — Other Ambulatory Visit: Payer: Self-pay | Admitting: *Deleted

## 2016-08-10 DIAGNOSIS — Z85528 Personal history of other malignant neoplasm of kidney: Secondary | ICD-10-CM

## 2016-08-11 ENCOUNTER — Encounter (HOSPITAL_COMMUNITY): Payer: Self-pay

## 2016-08-11 ENCOUNTER — Other Ambulatory Visit (HOSPITAL_BASED_OUTPATIENT_CLINIC_OR_DEPARTMENT_OTHER): Payer: Medicare Other

## 2016-08-11 ENCOUNTER — Ambulatory Visit (HOSPITAL_COMMUNITY)
Admission: RE | Admit: 2016-08-11 | Discharge: 2016-08-11 | Disposition: A | Discharge status: Home / Self Care | Payer: Medicare Other | Source: Ambulatory Visit | Attending: Oncology | Admitting: Oncology

## 2016-08-11 DIAGNOSIS — C649 Malignant neoplasm of unspecified kidney, except renal pelvis: Secondary | ICD-10-CM

## 2016-08-11 DIAGNOSIS — C7800 Secondary malignant neoplasm of unspecified lung: Secondary | ICD-10-CM

## 2016-08-11 DIAGNOSIS — C189 Malignant neoplasm of colon, unspecified: Secondary | ICD-10-CM | POA: Diagnosis not present

## 2016-08-11 DIAGNOSIS — D4989 Neoplasm of unspecified behavior of other specified sites: Secondary | ICD-10-CM | POA: Insufficient documentation

## 2016-08-11 DIAGNOSIS — R188 Other ascites: Secondary | ICD-10-CM | POA: Diagnosis not present

## 2016-08-11 DIAGNOSIS — C642 Malignant neoplasm of left kidney, except renal pelvis: Secondary | ICD-10-CM | POA: Insufficient documentation

## 2016-08-11 DIAGNOSIS — K4090 Unilateral inguinal hernia, without obstruction or gangrene, not specified as recurrent: Secondary | ICD-10-CM | POA: Insufficient documentation

## 2016-08-11 DIAGNOSIS — J90 Pleural effusion, not elsewhere classified: Secondary | ICD-10-CM | POA: Insufficient documentation

## 2016-08-11 DIAGNOSIS — R609 Edema, unspecified: Secondary | ICD-10-CM | POA: Diagnosis not present

## 2016-08-11 DIAGNOSIS — D3610 Benign neoplasm of peripheral nerves and autonomic nervous system, unspecified: Secondary | ICD-10-CM | POA: Insufficient documentation

## 2016-08-11 DIAGNOSIS — Z85528 Personal history of other malignant neoplasm of kidney: Secondary | ICD-10-CM

## 2016-08-11 LAB — COMPREHENSIVE METABOLIC PANEL
ALT: 18 U/L (ref 0–55)
ANION GAP: 9 meq/L (ref 3–11)
AST: 20 U/L (ref 5–34)
Albumin: 2.5 g/dL — ABNORMAL LOW (ref 3.5–5.0)
Alkaline Phosphatase: 61 U/L (ref 40–150)
BILIRUBIN TOTAL: 0.57 mg/dL (ref 0.20–1.20)
BUN: 19.8 mg/dL (ref 7.0–26.0)
CHLORIDE: 108 meq/L (ref 98–109)
CO2: 22 meq/L (ref 22–29)
CREATININE: 0.9 mg/dL (ref 0.7–1.3)
Calcium: 7.7 mg/dL — ABNORMAL LOW (ref 8.4–10.4)
EGFR: 78 mL/min/{1.73_m2} — ABNORMAL LOW (ref >90)
GLUCOSE: 82 mg/dL (ref 70–140)
Potassium: 3.7 mEq/L (ref 3.5–5.1)
SODIUM: 140 meq/L (ref 136–145)
TOTAL PROTEIN: 6.3 g/dL — AB (ref 6.4–8.3)

## 2016-08-11 LAB — CBC WITH DIFFERENTIAL/PLATELET
BASO%: 0.3 % (ref 0.0–2.0)
Basophils Absolute: 0 10*3/uL (ref 0.0–0.1)
EOS ABS: 0.2 10*3/uL (ref 0.0–0.5)
EOS%: 2.9 % (ref 0.0–7.0)
HEMATOCRIT: 37.5 % — AB (ref 38.4–49.9)
HEMOGLOBIN: 12.3 g/dL — AB (ref 13.0–17.1)
LYMPH#: 2.4 10*3/uL (ref 0.9–3.3)
LYMPH%: 33.5 % (ref 14.0–49.0)
MCH: 33.2 pg (ref 27.2–33.4)
MCHC: 32.8 g/dL (ref 32.0–36.0)
MCV: 101.3 fL — ABNORMAL HIGH (ref 79.3–98.0)
MONO#: 0.5 10*3/uL (ref 0.1–0.9)
MONO%: 7 % (ref 0.0–14.0)
NEUT%: 56.3 % (ref 39.0–75.0)
NEUTROS ABS: 4 10*3/uL (ref 1.5–6.5)
PLATELETS: 168 10*3/uL (ref 140–400)
RBC: 3.7 10*6/uL — AB (ref 4.20–5.82)
RDW: 16.3 % — AB (ref 11.0–14.6)
WBC: 7.1 10*3/uL (ref 4.0–10.3)

## 2016-08-11 MED ORDER — IOPAMIDOL (ISOVUE-300) INJECTION 61%
100.0000 mL | Freq: Once | INTRAVENOUS | Status: AC | PRN
  Administered 2016-08-11: 100 mL via INTRAVENOUS

## 2016-08-13 ENCOUNTER — Telehealth: Payer: Self-pay | Admitting: Oncology

## 2016-08-13 ENCOUNTER — Ambulatory Visit (HOSPITAL_BASED_OUTPATIENT_CLINIC_OR_DEPARTMENT_OTHER): Payer: Medicare Other | Admitting: Oncology

## 2016-08-13 VITALS — BP 126/64 | HR 58 | Temp 97.5°F | Resp 18 | Ht 67.0 in | Wt 138.9 lb

## 2016-08-13 DIAGNOSIS — C7800 Secondary malignant neoplasm of unspecified lung: Secondary | ICD-10-CM

## 2016-08-13 DIAGNOSIS — R339 Retention of urine, unspecified: Secondary | ICD-10-CM

## 2016-08-13 DIAGNOSIS — C649 Malignant neoplasm of unspecified kidney, except renal pelvis: Secondary | ICD-10-CM

## 2016-08-13 DIAGNOSIS — R197 Diarrhea, unspecified: Secondary | ICD-10-CM

## 2016-08-13 DIAGNOSIS — Z85038 Personal history of other malignant neoplasm of large intestine: Secondary | ICD-10-CM

## 2016-08-13 NOTE — Telephone Encounter (Signed)
GAVE PATIENT AVS REPORT AND APPOINTMENTS FOR September.  °

## 2016-08-13 NOTE — Progress Notes (Signed)
Hematology and Oncology Follow Up Visit  Bilbo Carcamo [id number] [birth year] 81 y.o. 08/13/2016 3:16 PM   Principle Diagnosis: 81 year old gentleman with:  1. T3 N0 colon cancer: He is status post a laparoscopic right hemicolectomy, with 0 out of 21 lymph nodes involved. That was done on 03/06/2013.  2. Renal cell carcinoma diagnosed in May of 2014. He presented with kidney mass and lung metastasis. 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  Prior therapy: Started Votrient 800 mg daily on 05/22/2013. Dose was reduced to 600 mg daily on 01/19/14 due to weight loss and diarrhea. Then reduced to 400 mg starting on 09/07/2014. The dose will be reduced further to 200 mg daily starting on 11/16/2014. The dose reduction is related to diarrhea and weight loss. Therapy discontinued in December 2016 for disease progression.  Current therapy: Cabometyx 40 mg daily started in December 2016. This was changed to 20 mg daily in March 2017.  Interim History: Mr. Nhan presents today for a follow up visit. Since the last visit, he continues to tolerate the current dose without any major changes.  He reports periodic diarrhea which is managed by Imodium. He denied any respiratory symptoms such as cough, wheezing or hemoptysis. He is eating better and his weight relatively stable. His performance status and activity level remains unchanged. He still able to do some work outside his house without any further decline.  He continues to have urinary retention with Foley catheter in place. He follows with Dr. Junious Silk for future management. He denied any pain or discomfort associated with it. He denied any dysuria.    He is not report any headaches, blurry vision or syncope. He denies chest pain or dyspnea on exertion. He denies abdominal pain. He does not report any hematochezia. He has not reported any pain in his hands or feet. Has not reported any  fevers or chills or sweats.  He does not report any pain especially in any skeletal structures. He denies any back pain or hip pain. He is not report any lymphadenopathy or petechiae.  Remainder of his review of systems unremarkable.   Medications: I have reviewed the patient's current medications. Reviewed today and unchanged. Current Outpatient Prescriptions  Medication Sig Dispense Refill  . cabozantinib S-Malate (CABOMETYX) 20 MG TABS Take 1 tablet by mouth daily. 30 tablet 0  . doxazosin (CARDURA) 8 MG tablet TAKE 1 TABLET DAILY 90 tablet 2  . finasteride (PROSCAR) 5 MG tablet Take 5 mg by mouth daily.    . fish oil-omega-3 fatty acids 1000 MG capsule Take 1 g by mouth 2 (two) times daily.    . hydrochlorothiazide (HYDRODIURIL) 25 MG tablet TAKE 1 TABLET DAILY 90 tablet 1  . hydrocortisone 2.5 % cream Apply topically 3 (three) times daily as needed. 28 g 3  . lactose free nutrition (BOOST) LIQD Take 237 mLs by mouth daily.    [name] levothyroxine (SYNTHROID, LEVOTHROID) 50 MCG tablet TAKE 2 TABLETS ON SUNDAY AND TAKE 1 TABLET ON OTHER DAYS (TAKE 8 PER WEEK TOTAL) 105 tablet 3  . loperamide (IMODIUM A-D) 2 MG tablet Take 2 mg by mouth as needed for diarrhea or loose stools.    . ondansetron (ZOFRAN) 8 MG tablet Take 1 tablet (8 mg total) by mouth every 8 (eight) hours as needed. for nausea 20 tablet 3  . potassium chloride SA (KLOR-CON M20) 20 MEQ tablet Take 1 tablet (20 mEq total) by  mouth 2 (two) times daily. 180 tablet 3  . propranolol (INDERAL) 40 MG tablet Take 0.5 tablets (20 mg total) by mouth 2 (two) times daily. 90 tablet 3  . verapamil (CALAN-SR) 240 MG CR tablet TAKE 1 TABLET DAILY 90 tablet 2  . vitamin C (ASCORBIC ACID) 500 MG tablet Take 500 mg by mouth daily.     No current facility-administered medications for this visit.     Allergies: No Known Allergies  Past Medical History, Surgical history, Social history, and Family History were reviewed and updated.    Physical  Exam: Blood pressure 126/64, pulse (!) 58, temperature 97.5 F (36.4 C), temperature source Oral, resp. rate 18, height '5\' 7"'$  (1.702 m), weight 138 lb 14.4 oz (63 kg), SpO2 99 %. ECOG: 1 General appearance: Chronically ill-appearing gentleman without distress. Head: Normocephalic, without obvious abnormality no oral ulcers or lesions. Neck: no adenopathy, no masses or lesions. Lymph nodes: Cervical, supraclavicular, and axillary nodes normal. Heart:regular rate and rhythm, S1, S2. No murmurs rubs or gallops. Lung:chest clear, no wheezing, rales, no dullness to percussion. Chest wall examination: No tenderness or masses noted. Abdomen: soft, non-tender, without masses or organomegaly no shifting dullness or ascites. EXT: Trace edema noted bilaterally. Skin: No erythema or induration noted.   Lab Results: Lab Results  Component Value Date   WBC 7.1 08/11/2016   HGB 12.3 (L) 08/11/2016   HCT 37.5 (L) 08/11/2016   MCV 101.3 (H) 08/11/2016   PLT 168 08/11/2016     CBC    Component Value Date/Time   WBC 7.1 08/11/2016 0744   WBC 5.7 04/02/2014 1416   RBC 3.70 (L) 08/11/2016 0744   RBC 2.95 (L) 04/02/2014 1416   HGB 12.3 (L) 08/11/2016 0744   HCT 37.5 (L) 08/11/2016 0744   PLT 168 08/11/2016 0744   MCV 101.3 (H) 08/11/2016 0744   MCH 33.2 08/11/2016 0744   MCH 27.2 05/03/2013 0900   MCHC 32.8 08/11/2016 0744   MCHC 33.8 04/02/2014 1416   RDW 16.3 (H) 08/11/2016 0744   LYMPHSABS 2.4 08/11/2016 0744   MONOABS 0.5 08/11/2016 0744   EOSABS 0.2 08/11/2016 0744   BASOSABS 0.0 08/11/2016 0744   CLINICAL DATA:  Colon cancer, prior right hemicolectomy. Renal cell carcinoma, diagnosed in 2014, stage IV with lung involvement. Mediastinal adenopathy. Ongoing oral chemotherapy.  EXAM: CT CHEST, ABDOMEN, AND PELVIS WITH CONTRAST  TECHNIQUE: Multidetector CT imaging of the chest, abdomen and pelvis was performed following the standard protocol during bolus administration of  intravenous contrast.  CONTRAST:  13m ISOVUE-300 IOPAMIDOL (ISOVUE-300) INJECTION 61%  COMPARISON:  Multiple exams, including 03/31/2016  FINDINGS: CT CHEST FINDINGS  Cardiovascular: Aortoiliac atherosclerotic vascular disease.  Mediastinum/Nodes: Left hilar node 1 cm in short axis on image 42/4, stable. Right hilar node 8 mm in short axis on image 45/4, previously 9 mm by my measurement. Subcarinal node 1.1 cm in short axis on image 47/4, previously the same by my measurements.  Left pericardial mass/lymph node 3.2 by 2.4 cm on image 80/4, previously 3.0 by 2.4 cm by my measurements. A second node in the pericardial near the cardiac apex measures 1.2 cm in short axis on image 81/4, formerly 0.9 cm.  Lungs/Pleura: Posterior peripheral tumor in the left lower lobe 4.6 by 2.6 cm on image 65/4, formerly 4.5 by 2.5 cm. Left pleural tumor thickness 1.3 cm on image 92/4, formerly 1.1 cm by my measurement. Large left and small to moderate right pleural effusion. Calcified granuloma along the minor  fissure. Stable interstitial accentuation in the lung bases, especially the right lower lobe. Tumor noted along the left major fissure with some surrounding collapse, difficult to measure but generally similar to prior. The prior left pneumothorax is no longer present.  Musculoskeletal: Metastatic lesion with pathologic fracture in the left scapula acromial base, images 1-8 of series 8.  CT ABDOMEN PELVIS FINDINGS  Hepatobiliary: Periportal edema.  Cholecystectomy.  Pancreas: 6 mm hypodense lesion in the inferior portion of the head of the pancreas, image 110/4.  Spleen: Unremarkable  Adrenals/Urinary Tract: Adrenal glands normal. Right mid kidney simple appearing cyst posteriorly. Centrally necrotic left renal cell carcinoma measures 5.9 by 4.3 by 5.6 cm (volume = 74 cm^3), with nodular components (former measurement 4.3 by 5.7 by 5.6 cm (volume = 72 cm^3)). In  addition there is a nodular tumor component in the renal hilum which may represent a satellite lesion or local invasion not included in the original measurement, this nodule measures 1.7 cm in diameter on image 114 series 4 (formerly 1.3 cm). A second nodular invasive component measures 1.0 cm in diameter on image 71/602, previously 0.7 cm.  A Foley catheter is present the urinary bladder. There is also some gas in the urinary bladder.  Stomach/Bowel: Indirect inguinal hernia contains a loop of sigmoid colon along with a small amount of ascites and surrounding adipose tissue. No strangulation or obstruction identified. Postoperative findings in the right colon.  Vascular/Lymphatic: Aortoiliac atherosclerotic vascular disease. No tumor thrombus observed in the left renal vein. No adenopathy identified.  Reproductive: Mildly prominent prostate gland, 5.6 by 3.9 by 5.0 cm (volume = 57 cm^3). Foley catheter in place.  Other: Trace pelvic ascites.  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 The degenerative disc disease and spondylosis at L2-3.  Chronically accentuated focal enhancement of the left L3 dorsal root ganglion/spinal nerve in the neural foramen, not appreciably changed from 2014, suspicious for schwannoma or neurofibroma. No accentuated hypermetabolic activity in this vicinity on the prior PET-CT of 02/08/2013.  IMPRESSION: 1. Tumor burden in the chest and abdomen generally stable or minimally increased in prominence compared to the prior exam. Examples of increase include the satellite nodules in the left kidney adjacent to the main renal cell carcinoma, 1 of which is increased from 1.3 cm to 1.7 cm in diameter, and a second nodule having increased from 0.7 cm to 1.0 cm. One of the pericardial deposits of tumor in the chest currently measures 1.2 cm in short axis, formerly 0.9 cm. 2. Note is made of a pathologic fracture of the left  acromion. 3. Large left and small to moderate right pleural effusions. Pleural tumor deposition on the left, increased in thickness. 4. 6 mm hypodense lesion in the inferior head of the pancreas, probably incidental but merits observation. 5. Indirect left inguinal hernia contains a loop of sigmoid colon without complicating feature. 6. Small amount of pelvic ascites and small amount of ascites in the left inguinal hernia. 7.  Aortoiliac atherosclerotic vascular disease. 8. Focal enhancement in the left L3 dorsal root ganglion/spinal nerve in the neural foramen, no change since 2014, probably a schwannoma or neurofibroma. 9. Mild nonspecific periportal edema in the liver.   A 81 year old gentleman with the following issues.   1. Colon cancer: He is status post a laparoscopic right hemicolectomy, with the pathology revealing a T3 N0, with 0 out of 21 lymph nodes involved. He did not receive adjuvant chemotherapy for colon cancer given that he has  stage IV kidney cancer.    He remains active surveillance without any evidence of cancer relapse noted on his recent scan.  2. Renal cell cancer: he has stage IV disease with lung involvment. He was initially treated with Votrient and subsequently developed progression of disease.   He has been on Cabometyx since December 2017. His dose has been reduced from 40 mg to 20 mg and have tolerated it much better.   CT scan on 08/11/2016 was reviewed today and showed stable disease.  The plan is to continue with the same dose and schedule of this medication and repeat imaging studies in 4 months.   3. Weight loss: Likely related to Cabometyx. His weight relatively stable on the current dose. We discussed different strategies to boost his nutritional intake.  4. Diarrhea. Seems to be manageable with the dose reduction. He does require Imodium periodically.  5. Urinary retention: Foley catheter is in place and follows with urology.  6. Nausea:  Controlled by Zofran. No changes in his nausea at this time.  7. Pleural effusion/pneumothorax: This appeared to have resolved on the recent CT scan.  8. The transaminases surveillance: Liver function tests noted to be normal on 08/11/2016.  9. Followup: Will be in one month.       [facility], MD 8/24/20173:16 PM

## 2016-08-14 DIAGNOSIS — R338 Other retention of urine: Secondary | ICD-10-CM | POA: Diagnosis not present

## 2016-08-16 ENCOUNTER — Other Ambulatory Visit: Payer: Self-pay | Admitting: Family Medicine

## 2016-09-11 DIAGNOSIS — R338 Other retention of urine: Secondary | ICD-10-CM | POA: Diagnosis not present

## 2016-09-17 ENCOUNTER — Ambulatory Visit (HOSPITAL_BASED_OUTPATIENT_CLINIC_OR_DEPARTMENT_OTHER): Payer: Medicare Other | Admitting: Oncology

## 2016-09-17 ENCOUNTER — Telehealth: Payer: Self-pay | Admitting: Oncology

## 2016-09-17 ENCOUNTER — Other Ambulatory Visit (HOSPITAL_BASED_OUTPATIENT_CLINIC_OR_DEPARTMENT_OTHER): Payer: Medicare Other

## 2016-09-17 VITALS — BP 166/71 | HR 62 | Temp 97.5°F | Resp 18 | Ht 67.0 in | Wt 141.2 lb

## 2016-09-17 DIAGNOSIS — C649 Malignant neoplasm of unspecified kidney, except renal pelvis: Secondary | ICD-10-CM

## 2016-09-17 DIAGNOSIS — C7800 Secondary malignant neoplasm of unspecified lung: Secondary | ICD-10-CM

## 2016-09-17 DIAGNOSIS — R110 Nausea: Secondary | ICD-10-CM | POA: Diagnosis not present

## 2016-09-17 DIAGNOSIS — R197 Diarrhea, unspecified: Secondary | ICD-10-CM | POA: Diagnosis not present

## 2016-09-17 DIAGNOSIS — Z85038 Personal history of other malignant neoplasm of large intestine: Secondary | ICD-10-CM

## 2016-09-17 LAB — COMPREHENSIVE METABOLIC PANEL
ALK PHOS: 66 U/L (ref 40–150)
ALT: 17 U/L (ref 0–55)
ANION GAP: 9 meq/L (ref 3–11)
AST: 17 U/L (ref 5–34)
Albumin: 2.3 g/dL — ABNORMAL LOW (ref 3.5–5.0)
BILIRUBIN TOTAL: 0.42 mg/dL (ref 0.20–1.20)
BUN: 20.8 mg/dL (ref 7.0–26.0)
CALCIUM: 7.8 mg/dL — AB (ref 8.4–10.4)
CHLORIDE: 110 meq/L — AB (ref 98–109)
CO2: 24 mEq/L (ref 22–29)
CREATININE: 1 mg/dL (ref 0.7–1.3)
EGFR: 69 mL/min/{1.73_m2} — ABNORMAL LOW (ref >90)
Glucose: 81 mg/dl (ref 70–140)
Potassium: 3.6 mEq/L (ref 3.5–5.1)
Sodium: 143 mEq/L (ref 136–145)
TOTAL PROTEIN: 6 g/dL — AB (ref 6.4–8.3)

## 2016-09-17 LAB — CBC WITH DIFFERENTIAL/PLATELET
BASO%: 0.1 % (ref 0.0–2.0)
Basophils Absolute: 0 10*3/uL (ref 0.0–0.1)
EOS%: 4 % (ref 0.0–7.0)
Eosinophils Absolute: 0.3 10*3/uL (ref 0.0–0.5)
HEMATOCRIT: 35.5 % — AB (ref 38.4–49.9)
HGB: 11.5 g/dL — ABNORMAL LOW (ref 13.0–17.1)
LYMPH#: 3 10*3/uL (ref 0.9–3.3)
LYMPH%: 43.2 % (ref 14.0–49.0)
MCH: 33.3 pg (ref 27.2–33.4)
MCHC: 32.4 g/dL (ref 32.0–36.0)
MCV: 102.9 fL — ABNORMAL HIGH (ref 79.3–98.0)
MONO#: 0.5 10*3/uL (ref 0.1–0.9)
MONO%: 6.4 % (ref 0.0–14.0)
NEUT#: 3.3 10*3/uL (ref 1.5–6.5)
NEUT%: 46.3 % (ref 39.0–75.0)
PLATELETS: 135 10*3/uL — AB (ref 140–400)
RBC: 3.45 10*6/uL — ABNORMAL LOW (ref 4.20–5.82)
RDW: 15.3 % — ABNORMAL HIGH (ref 11.0–14.6)
WBC: 7 10*3/uL (ref 4.0–10.3)

## 2016-09-17 NOTE — Telephone Encounter (Signed)
Avs report and appointment schedule given to patient per 09/17/16 los.

## 2016-09-17 NOTE — Progress Notes (Signed)
Hematology and Oncology Follow Up Visit  Hunter Hardin [id number] [birth year] 81 y.o. 09/17/2016 3:31 PM   Principle Diagnosis: 81 year old gentleman with:  1. T3 N0 colon cancer: He is status post a laparoscopic right hemicolectomy, with 0 out of 21 lymph nodes involved. That was done on 03/06/2013.  2. Renal cell carcinoma diagnosed in May of 2014. He presented with kidney mass and lung metastasis. 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  Prior therapy: Started Votrient 800 mg daily on 05/22/2013. Dose was reduced to 600 mg daily on 01/19/14 due to weight loss and diarrhea. Then reduced to 400 mg starting on 09/07/2014. The dose will be reduced further to 200 mg daily starting on 11/16/2014. The dose reduction is related to diarrhea and weight loss. Therapy discontinued in December 2016 for disease progression.  Current therapy: Cabometyx 40 mg daily started in December 2016. This was changed to 20 mg daily in March 2017.  Interim History: Mr. Caughlin presents today for a follow up visit. Since the last visit, he reports no major changes in his health. He continues to tolerate the current dose without any major changes. He denied any respiratory symptoms such as cough, wheezing or hemoptysis. His appetite is much improved and have gained more weight. His activity level remain intact and continues to live independently. He is able to attend activities of daily living including driving short distances.     He is not report any headaches, blurry vision or syncope. He denies chest pain or dyspnea on exertion. He denies abdominal pain. He does not report any hematochezia. He has not reported any pain in his hands or feet. Has not reported any fevers or chills or sweats.  He does not report any pain especially in any skeletal structures. He denies any back pain or hip pain. He is not report any lymphadenopathy or petechiae.   Remainder of his review of systems unremarkable.   Medications: I have reviewed the patient's current medications. Reviewed today and unchanged. Current Outpatient Prescriptions  Medication Sig Dispense Refill  . cabozantinib S-Malate (CABOMETYX) 20 MG TABS Take 1 tablet by mouth daily. 30 tablet 0  . doxazosin (CARDURA) 8 MG tablet TAKE 1 TABLET DAILY 90 tablet 2  . finasteride (PROSCAR) 5 MG tablet Take 5 mg by mouth daily.    . fish oil-omega-3 fatty acids 1000 MG capsule Take 1 g by mouth 2 (two) times daily.    . hydrochlorothiazide (HYDRODIURIL) 25 MG tablet TAKE 1 TABLET DAILY 90 tablet 1  . hydrocortisone 2.5 % cream Apply topically 3 (three) times daily as needed. 28 g 3  . KLOR-CON M20 20 MEQ tablet TAKE 1 TABLET TWICE A DAY 180 tablet 1  . lactose free nutrition (BOOST) LIQD Take 237 mLs by mouth daily.    [name] levothyroxine (SYNTHROID, LEVOTHROID) 50 MCG tablet TAKE 2 TABLETS ON SUNDAY AND 1 TABLET ON ALL OTHER DAYS (8 TABLETS A WEEK TOTAL) 105 tablet 1  . loperamide (IMODIUM A-D) 2 MG tablet Take 2 mg by mouth as needed for diarrhea or loose stools.    . ondansetron (ZOFRAN) 8 MG tablet Take 1 tablet (8 mg total) by mouth every 8 (eight) hours as needed. for nausea 20 tablet 3  . propranolol (INDERAL) 40 MG tablet Take 0.5 tablets (20 mg total) by mouth 2 (two) times daily. 90 tablet 3  . verapamil (CALAN-SR) 240 MG CR tablet TAKE 1 TABLET  DAILY 90 tablet 2  . vitamin C (ASCORBIC ACID) 500 MG tablet Take 500 mg by mouth daily.     No current facility-administered medications for this visit.     Allergies: No Known Allergies  Past Medical History, Surgical history, Social history, and Family History were reviewed and updated.    Physical Exam: Blood pressure (!) 166/71, pulse 62, temperature 97.5 F (36.4 C), temperature source Oral, resp. rate 18, height '5\' 7"'$  (1.702 m), weight 141 lb 3.2 oz (64 kg), SpO2 98 %. ECOG: 1 General appearance: Alert, awake gentleman without  distress. Head: Normocephalic, without obvious abnormality no oral thrush or ulcers. Neck: no adenopathy, no masses or lesions. No thyromegaly. Lymph nodes: Cervical, supraclavicular, and axillary nodes normal. Heart:regular rate and rhythm, S1, S2. Lung:chest clear, no wheezing, rales. Decreased breath sounds on the left side. Chest wall examination: No tenderness or masses noted. Abdomen: soft, non-tender, without masses or organomegaly no shifting dullness or ascites. EXT: Trace edema noted bilaterally. Skin: No erythema or induration noted.   Lab Results: Lab Results  Component Value Date   WBC 7.0 09/17/2016   HGB 11.5 (L) 09/17/2016   HCT 35.5 (L) 09/17/2016   MCV 102.9 (H) 09/17/2016   PLT 135 (L) 09/17/2016     CBC    Component Value Date/Time   WBC 7.0 09/17/2016 1506   WBC 5.7 04/02/2014 1416   RBC 3.45 (L) 09/17/2016 1506   RBC 2.95 (L) 04/02/2014 1416   HGB 11.5 (L) 09/17/2016 1506   HCT 35.5 (L) 09/17/2016 1506   PLT 135 (L) 09/17/2016 1506   MCV 102.9 (H) 09/17/2016 1506   MCH 33.3 09/17/2016 1506   MCH 27.2 05/03/2013 0900   MCHC 32.4 09/17/2016 1506   MCHC 33.8 04/02/2014 1416   RDW 15.3 (H) 09/17/2016 1506   LYMPHSABS 3.0 09/17/2016 1506   MONOABS 0.5 09/17/2016 1506   EOSABS 0.3 09/17/2016 1506   BASOSABS 0.0 09/17/2016 1506     IMPRESSION: 1. Tumor burden in the chest and abdomen generally stable or minimally increased in prominence compared to the prior exam. Examples of increase include the satellite nodules in the left kidney adjacent to the main renal cell carcinoma, 1 of which is increased from 1.3 cm to 1.7 cm in diameter, and a second nodule having increased from 0.7 cm to 1.0 cm. One of the pericardial deposits of tumor in the chest currently measures 1.2 cm in short axis, formerly 0.9 cm. 2. Note is made of a pathologic fracture of the left acromion. 3. Large left and small to moderate right pleural effusions. Pleural tumor  deposition on the left, increased in thickness. 4. 6 mm hypodense lesion in the inferior head of the pancreas, probably incidental but merits observation. 5. Indirect left inguinal hernia contains a loop of sigmoid colon without complicating feature. 6. Small amount of pelvic ascites and small amount of ascites in the left inguinal hernia. 7.  Aortoiliac atherosclerotic vascular disease. 8. Focal enhancement in the left L3 dorsal root ganglion/spinal nerve in the neural foramen, no change since 2014, probably a schwannoma or neurofibroma. 9. Mild nonspecific periportal edema in the liver.   A 81 year old gentleman with the following issues.   1. Colon cancer: He is status post a laparoscopic right hemicolectomy, with the pathology revealing a T3 N0, with 0 out of 21 lymph nodes involved. He did not receive adjuvant chemotherapy for colon cancer given that he has stage IV kidney cancer.    He remains active  surveillance without any evidence of cancer relapse noted on last CT scan obtained on 08/11/2016.  2. Renal cell cancer: he has stage IV disease with lung involvment. He was initially treated with Votrient and subsequently developed progression of disease.   He has been on Cabometyx since December 2017. His dose has been reduced from 40 mg to 20 mg and have tolerated it much better.   CT scan on 08/11/2016 was reviewed again and discussed with the patient. For the most part his disease remains stable with slight element of progression. The plan is to continue the same dose and schedule and repeat imaging studies in 3 months. If his sellers therapy will be utilized in the future if he progresses.   3. Weight loss: related to Cabometyx. His weight has improved since the last visit.  4. Diarrhea. Seems to be manageable with the dose reduction. He does require Imodium periodically. Not dramatically different from previous times.  5. Urinary retention: Appears to have resolved at this  time.  6. Nausea: Controlled by Zofran. No changes in his nausea at this time.  7. Pleural effusion/pneumothorax: He continues to have residual effusion but asymptomatic at this time. Paracentesis can be utilized in the future if he becomes symptomatic.  8. The transaminases surveillance: Liver function tests noted to be normal on 08/11/2016. These will be followed monthly.  9. Followup: Will be in one month.       Zola Button, MD 9/28/20173:31 PM

## 2016-10-13 DIAGNOSIS — R338 Other retention of urine: Secondary | ICD-10-CM | POA: Diagnosis not present

## 2016-10-23 ENCOUNTER — Ambulatory Visit (HOSPITAL_BASED_OUTPATIENT_CLINIC_OR_DEPARTMENT_OTHER): Payer: Medicare Other | Admitting: Oncology

## 2016-10-23 ENCOUNTER — Telehealth: Payer: Self-pay | Admitting: Oncology

## 2016-10-23 ENCOUNTER — Other Ambulatory Visit (HOSPITAL_BASED_OUTPATIENT_CLINIC_OR_DEPARTMENT_OTHER): Payer: Medicare Other

## 2016-10-23 VITALS — BP 143/62 | HR 60 | Temp 97.6°F | Resp 18 | Ht 67.0 in | Wt 142.8 lb

## 2016-10-23 DIAGNOSIS — R197 Diarrhea, unspecified: Secondary | ICD-10-CM

## 2016-10-23 DIAGNOSIS — C7800 Secondary malignant neoplasm of unspecified lung: Secondary | ICD-10-CM

## 2016-10-23 DIAGNOSIS — C649 Malignant neoplasm of unspecified kidney, except renal pelvis: Secondary | ICD-10-CM | POA: Diagnosis not present

## 2016-10-23 DIAGNOSIS — C189 Malignant neoplasm of colon, unspecified: Secondary | ICD-10-CM

## 2016-10-23 DIAGNOSIS — R0609 Other forms of dyspnea: Secondary | ICD-10-CM | POA: Diagnosis not present

## 2016-10-23 DIAGNOSIS — J90 Pleural effusion, not elsewhere classified: Secondary | ICD-10-CM | POA: Diagnosis not present

## 2016-10-23 DIAGNOSIS — Z85038 Personal history of other malignant neoplasm of large intestine: Secondary | ICD-10-CM | POA: Diagnosis not present

## 2016-10-23 LAB — CBC WITH DIFFERENTIAL/PLATELET
BASO%: 0.1 % (ref 0.0–2.0)
BASOS ABS: 0 10*3/uL (ref 0.0–0.1)
EOS%: 2.7 % (ref 0.0–7.0)
Eosinophils Absolute: 0.2 10*3/uL (ref 0.0–0.5)
HCT: 37.5 % — ABNORMAL LOW (ref 38.4–49.9)
HEMOGLOBIN: 12.3 g/dL — AB (ref 13.0–17.1)
LYMPH%: 42.5 % (ref 14.0–49.0)
MCH: 33.6 pg — AB (ref 27.2–33.4)
MCHC: 32.8 g/dL (ref 32.0–36.0)
MCV: 102.5 fL — ABNORMAL HIGH (ref 79.3–98.0)
MONO#: 0.4 10*3/uL (ref 0.1–0.9)
MONO%: 6.5 % (ref 0.0–14.0)
NEUT#: 3.3 10*3/uL (ref 1.5–6.5)
NEUT%: 48.2 % (ref 39.0–75.0)
Platelets: 138 10*3/uL — ABNORMAL LOW (ref 140–400)
RBC: 3.66 10*6/uL — AB (ref 4.20–5.82)
RDW: 14.9 % — AB (ref 11.0–14.6)
WBC: 6.8 10*3/uL (ref 4.0–10.3)
lymph#: 2.9 10*3/uL (ref 0.9–3.3)

## 2016-10-23 LAB — COMPREHENSIVE METABOLIC PANEL
ALBUMIN: 2.4 g/dL — AB (ref 3.5–5.0)
ALT: 27 U/L (ref 0–55)
AST: 24 U/L (ref 5–34)
Alkaline Phosphatase: 80 U/L (ref 40–150)
Anion Gap: 7 mEq/L (ref 3–11)
BUN: 15.8 mg/dL (ref 7.0–26.0)
CHLORIDE: 110 meq/L — AB (ref 98–109)
CO2: 26 meq/L (ref 22–29)
Calcium: 7.9 mg/dL — ABNORMAL LOW (ref 8.4–10.4)
Creatinine: 1.1 mg/dL (ref 0.7–1.3)
EGFR: 67 mL/min/{1.73_m2} — ABNORMAL LOW (ref >90)
GLUCOSE: 95 mg/dL (ref 70–140)
POTASSIUM: 3.5 meq/L (ref 3.5–5.1)
SODIUM: 143 meq/L (ref 136–145)
Total Bilirubin: 0.48 mg/dL (ref 0.20–1.20)
Total Protein: 6.1 g/dL — ABNORMAL LOW (ref 6.4–8.3)

## 2016-10-23 NOTE — Telephone Encounter (Signed)
2 bottles of contrast given, per CT ordered. Appointments scheduled per 10/23/16 los. AVS report and appointment schedule given to patient, per 10/23/16 los.

## 2016-10-23 NOTE — Progress Notes (Signed)
Hematology and Oncology Follow Up Visit  Hunter Hardin [id number] March 18, 1935 81 y.o. 10/23/2016 8:30 AM   Principle Diagnosis: 81 year old gentleman with:  1. T3 N0 colon cancer: He is status post Hardin laparoscopic right hemicolectomy, with 0 out of 21 lymph nodes involved. That was done on 03/06/2013.  2. Renal cell carcinoma diagnosed in May of 2014. He presented with kidney mass and lung metastasis. 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  Prior therapy: Started Votrient 800 mg daily on 05/22/2013. Dose was reduced to 600 mg daily on 01/19/14 due to weight loss and diarrhea. Then reduced to 400 mg starting on 09/07/2014. The dose will be reduced further to 200 mg daily starting on 11/16/2014. The dose reduction is related to diarrhea and weight loss. Therapy discontinued in December 2016 for disease progression.  Current therapy: Cabometyx 40 mg daily started in December 2016. This was changed to 20 mg daily in March 2017.  Interim History: Mr. Hunter Hardin presents today for Hardin follow up visit. Since the last visit, he reports sinus congestion and upper respiratory tract infection. He does report periodic cough but no fevers, chills or hemoptysis. He does report dyspnea on exertion but comfortable at rest.. He continues to tolerate the current dose of Cabometyx without any major changes. His appetite is much improved and have gained more weight. His activity level remain intact and continues to live independently. He is able to attend activities of daily living including driving short distances. He denied any worsening diarrhea or change in his quality of life.     He is not report any headaches, blurry vision or syncope. He denies chest pain or dyspnea on exertion. He denies abdominal pain. He does not report any hematochezia. He has not reported any pain in his hands or feet. Has not reported any fevers or chills or sweats.  He  does not report any pain especially in any skeletal structures. He denies any back pain or hip pain. He is not report any lymphadenopathy or petechiae.  Remainder of his review of systems unremarkable.   Medications: Hunter have reviewed the patient's current medications. Reviewed today and unchanged. Current Outpatient Prescriptions  Medication Sig Dispense Refill  . cabozantinib S-Malate (CABOMETYX) 20 MG TABS Take 1 tablet by mouth daily. 30 tablet 0  . doxazosin (CARDURA) 8 MG tablet TAKE 1 TABLET DAILY 90 tablet 2  . finasteride (PROSCAR) 5 MG tablet Take 5 mg by mouth daily.    . fish oil-omega-3 fatty acids 1000 MG capsule Take 1 g by mouth 2 (two) times daily.    . hydrochlorothiazide (HYDRODIURIL) 25 MG tablet TAKE 1 TABLET DAILY 90 tablet 1  . hydrocortisone 2.5 % cream Apply topically 3 (three) times daily as needed. 28 g 3  . KLOR-CON M20 20 MEQ tablet TAKE 1 TABLET TWICE Hardin DAY 180 tablet 1  . lactose free nutrition (BOOST) LIQD Take 237 mLs by mouth daily.    [name] levothyroxine (SYNTHROID, LEVOTHROID) 50 MCG tablet TAKE 2 TABLETS ON SUNDAY AND 1 TABLET ON ALL OTHER DAYS (8 TABLETS Hardin WEEK TOTAL) 105 tablet 1  . loperamide (IMODIUM Hardin-D) 2 MG tablet Take 2 mg by mouth as needed for diarrhea or loose stools.    . ondansetron (ZOFRAN) 8 MG tablet Take 1 tablet (8 mg total) by mouth every 8 (eight) hours as needed. for nausea 20 tablet 3  . propranolol (INDERAL) 40 MG tablet Take  0.5 tablets (20 mg total) by mouth 2 (two) times daily. 90 tablet 3  . verapamil (CALAN-SR) 240 MG CR tablet TAKE 1 TABLET DAILY 90 tablet 2  . vitamin C (ASCORBIC ACID) 500 MG tablet Take 500 mg by mouth daily.     No current facility-administered medications for this visit.     Allergies: No Known Allergies  Past Medical History, Surgical history, Social history, and Family History were reviewed and updated.    Physical Exam: Blood pressure (!) 143/62, pulse 60, temperature 97.6 F (36.4 C), temperature  source Oral, resp. rate 18, height '5\' 7"'$  (1.702 m), weight 142 lb 12.8 oz (64.8 kg), SpO2 97 %. ECOG: 1 General appearance: Well-appearing gentleman appeared comfortable. Head: Normocephalic, without obvious abnormality no rebound or guarding. Neck: no adenopathy, no masses or lesions. No thyromegaly. Lymph nodes: Cervical, supraclavicular, and axillary nodes normal. Heart:regular rate and rhythm, S1, S2. Lung:chest clear, no wheezing, rales. Decreased breath sounds on the left side. Unchanged from previous examination. Chest wall examination: No tenderness or masses noted. Abdomen: soft, non-tender, without masses or organomegaly no rebound or guarding. EXT:  edema noted bilaterally in his lower extremities. Skin: No erythema or induration noted.   Lab Results: Lab Results  Component Value Date   WBC 6.8 10/23/2016   HGB 12.3 (L) 10/23/2016   HCT 37.5 (L) 10/23/2016   MCV 102.5 (H) 10/23/2016   PLT 138 (L) 10/23/2016     CBC    Component Value Date/Time   WBC 6.8 10/23/2016 0754   WBC 5.7 04/02/2014 1416   RBC 3.66 (L) 10/23/2016 0754   RBC 2.95 (L) 04/02/2014 1416   HGB 12.3 (L) 10/23/2016 0754   HCT 37.5 (L) 10/23/2016 0754   PLT 138 (L) 10/23/2016 0754   MCV 102.5 (H) 10/23/2016 0754   MCH 33.6 (H) 10/23/2016 0754   MCH 27.2 05/03/2013 0900   MCHC 32.8 10/23/2016 0754   MCHC 33.8 04/02/2014 1416   RDW 14.9 (H) 10/23/2016 0754   LYMPHSABS 2.9 10/23/2016 0754   MONOABS 0.4 10/23/2016 0754   EOSABS 0.2 10/23/2016 0754   BASOSABS 0.0 10/23/2016 07537     Hunter Hardin 81 year old gentleman with the following issues.   1. Colon cancer: He is status post Hardin laparoscopic right hemicolectomy, with the pathology revealing Hardin T3 N0, with 0 out of 21 lymph nodes involved. He did not receive adjuvant chemotherapy for colon cancer given that he has stage IV kidney cancer.    He remains active surveillance without any evidence of cancer relapse noted on last CT scan obtained on  08/11/2016.  2. Renal cell cancer: he has stage IV disease with lung involvment. He was initially treated with Votrient and subsequently developed progression of disease.   He has been on Cabometyx since December 2017. His dose has been reduced from 40 mg to 20 mg and have tolerated it much better.   CT scan on 08/11/2016 remains stable with slight element of progression.   The plan is to continue the same dose and schedule and repeat imaging studies in December 2017. Different salvage therapy will be utilized she develops progression of disease.   3. Weight loss: related to Cabometyx. This has improved with dose reductions and his weight continues to improve.  4. Diarrhea. Seems to be manageable with the dose reduction. He does require Imodium periodically. Not dramatically different from previous times.  5. Urinary retention: His urinary symptoms are back to baseline at this time.  6. Nausea: Controlled  by Zofran. No changes in his nausea at this time.  7. Pleural effusion/pneumothorax: He continues to have residual effusion but asymptomatic at this time. Paracentesis can be utilized in the future if he becomes symptomatic. Hunter gave him instructions if he develops progressive shortness of breath at rest to Hardin low dose to these findings as well as possible.  8. The transaminases surveillance: Liver function tests continues to be within normal range.  9. Followup: In one month after repeat imaging studies.       QGBEEF,EOFHQ, MD 11/3/20178:30 AM

## 2016-10-30 ENCOUNTER — Encounter (HOSPITAL_COMMUNITY): Payer: Self-pay | Admitting: Emergency Medicine

## 2016-10-30 ENCOUNTER — Encounter: Payer: Self-pay | Admitting: Family Medicine

## 2016-10-30 ENCOUNTER — Telehealth: Payer: Self-pay

## 2016-10-30 ENCOUNTER — Ambulatory Visit: Payer: Medicare Other | Admitting: Family Medicine

## 2016-10-30 ENCOUNTER — Observation Stay (HOSPITAL_COMMUNITY)
Admission: EM | Admit: 2016-10-30 | Discharge: 2016-10-31 | Disposition: A | Discharge status: Home / Self Care | Payer: Medicare Other | Attending: Internal Medicine | Admitting: Internal Medicine

## 2016-10-30 ENCOUNTER — Ambulatory Visit (INDEPENDENT_AMBULATORY_CARE_PROVIDER_SITE_OTHER)
Admission: RE | Admit: 2016-10-30 | Discharge: 2016-10-30 | Disposition: A | Discharge status: Home / Self Care | Payer: Medicare Other | Source: Ambulatory Visit | Attending: Family Medicine | Admitting: Family Medicine

## 2016-10-30 ENCOUNTER — Ambulatory Visit (INDEPENDENT_AMBULATORY_CARE_PROVIDER_SITE_OTHER): Payer: Medicare Other | Admitting: Family Medicine

## 2016-10-30 VITALS — BP 140/62 | HR 60 | Temp 97.4°F | Wt 142.5 lb

## 2016-10-30 DIAGNOSIS — R609 Edema, unspecified: Secondary | ICD-10-CM | POA: Diagnosis not present

## 2016-10-30 DIAGNOSIS — E039 Hypothyroidism, unspecified: Secondary | ICD-10-CM | POA: Diagnosis not present

## 2016-10-30 DIAGNOSIS — D696 Thrombocytopenia, unspecified: Secondary | ICD-10-CM | POA: Insufficient documentation

## 2016-10-30 DIAGNOSIS — E876 Hypokalemia: Secondary | ICD-10-CM | POA: Insufficient documentation

## 2016-10-30 DIAGNOSIS — R059 Cough, unspecified: Secondary | ICD-10-CM

## 2016-10-30 DIAGNOSIS — Z85038 Personal history of other malignant neoplasm of large intestine: Secondary | ICD-10-CM | POA: Insufficient documentation

## 2016-10-30 DIAGNOSIS — I10 Essential (primary) hypertension: Secondary | ICD-10-CM | POA: Diagnosis present

## 2016-10-30 DIAGNOSIS — Z87891 Personal history of nicotine dependence: Secondary | ICD-10-CM | POA: Diagnosis not present

## 2016-10-30 DIAGNOSIS — J90 Pleural effusion, not elsewhere classified: Principal | ICD-10-CM | POA: Insufficient documentation

## 2016-10-30 DIAGNOSIS — R05 Cough: Secondary | ICD-10-CM

## 2016-10-30 DIAGNOSIS — Z79899 Other long term (current) drug therapy: Secondary | ICD-10-CM | POA: Insufficient documentation

## 2016-10-30 DIAGNOSIS — C7800 Secondary malignant neoplasm of unspecified lung: Secondary | ICD-10-CM | POA: Diagnosis not present

## 2016-10-30 DIAGNOSIS — C649 Malignant neoplasm of unspecified kidney, except renal pelvis: Secondary | ICD-10-CM | POA: Diagnosis not present

## 2016-10-30 DIAGNOSIS — R600 Localized edema: Secondary | ICD-10-CM | POA: Diagnosis not present

## 2016-10-30 DIAGNOSIS — Z9049 Acquired absence of other specified parts of digestive tract: Secondary | ICD-10-CM | POA: Diagnosis not present

## 2016-10-30 DIAGNOSIS — C189 Malignant neoplasm of colon, unspecified: Secondary | ICD-10-CM | POA: Diagnosis present

## 2016-10-30 DIAGNOSIS — N400 Enlarged prostate without lower urinary tract symptoms: Secondary | ICD-10-CM | POA: Insufficient documentation

## 2016-10-30 LAB — CBC WITH DIFFERENTIAL/PLATELET
BASOS ABS: 0 {cells}/uL (ref 0–200)
BASOS ABS: 0 10*3/uL (ref 0.0–0.1)
BASOS PCT: 0 %
Basophils Relative: 0 %
EOS PCT: 2 %
EOS PCT: 2 %
Eosinophils Absolute: 142 cells/uL (ref 15–500)
Eosinophils Absolute: 0.2 10*3/uL (ref 0.0–0.7)
HCT: 39.3 % (ref 38.5–50.0)
HCT: 37.9 % — ABNORMAL LOW (ref 39.0–52.0)
HEMOGLOBIN: 12.7 g/dL — AB (ref 13.2–17.1)
HEMOGLOBIN: 12.7 g/dL — AB (ref 13.0–17.0)
LYMPHS ABS: 2698 {cells}/uL (ref 850–3900)
LYMPHS ABS: 3.3 10*3/uL (ref 0.7–4.0)
LYMPHS PCT: 36 %
Lymphocytes Relative: 38 %
MCH: 33.3 pg — AB (ref 27.0–33.0)
MCH: 33.6 pg (ref 26.0–34.0)
MCHC: 32.3 g/dL (ref 32.0–36.0)
MCHC: 33.5 g/dL (ref 30.0–36.0)
MCV: 103.1 fL — ABNORMAL HIGH (ref 80.0–100.0)
MCV: 100.3 fL — AB (ref 78.0–100.0)
MONOS PCT: 8 %
MPV: 8.6 fL (ref 7.5–12.5)
Monocytes Absolute: 568 cells/uL (ref 200–950)
Monocytes Absolute: 0.7 10*3/uL (ref 0.1–1.0)
Monocytes Relative: 7 %
NEUTROS ABS: 3692 {cells}/uL (ref 1500–7800)
NEUTROS PCT: 55 %
Neutro Abs: 5.1 10*3/uL (ref 1.7–7.7)
Neutrophils Relative %: 52 %
PLATELETS: 153 10*3/uL (ref 140–400)
PLATELETS: 148 10*3/uL — AB (ref 150–400)
RBC: 3.81 MIL/uL — AB (ref 4.20–5.80)
RBC: 3.78 MIL/uL — AB (ref 4.22–5.81)
RDW: 14.6 % (ref 11.0–15.0)
RDW: 15.1 % (ref 11.5–15.5)
WBC: 7.1 10*3/uL (ref 3.8–10.8)
WBC: 9.2 10*3/uL (ref 4.0–10.5)

## 2016-10-30 LAB — COMPREHENSIVE METABOLIC PANEL
ALBUMIN: 2.8 g/dL — AB (ref 3.5–5.0)
ALT: 28 U/L (ref 17–63)
AST: 27 U/L (ref 15–41)
Alkaline Phosphatase: 69 U/L (ref 38–126)
Anion gap: 8 (ref 5–15)
BILIRUBIN TOTAL: 0.6 mg/dL (ref 0.3–1.2)
BUN: 17 mg/dL (ref 6–20)
CHLORIDE: 108 mmol/L (ref 101–111)
CO2: 24 mmol/L (ref 22–32)
Calcium: 7.8 mg/dL — ABNORMAL LOW (ref 8.9–10.3)
Creatinine, Ser: 0.99 mg/dL (ref 0.61–1.24)
GFR calc Af Amer: >60 mL/min (ref >60)
GFR calc non Af Amer: >60 mL/min (ref >60)
GLUCOSE: 98 mg/dL (ref 65–99)
POTASSIUM: 3.2 mmol/L — AB (ref 3.5–5.1)
SODIUM: 140 mmol/L (ref 135–145)
Total Protein: 6.3 g/dL — ABNORMAL LOW (ref 6.5–8.1)

## 2016-10-30 LAB — PROTIME-INR
INR: 1.1
PROTHROMBIN TIME: 14.3 s (ref 11.4–15.2)

## 2016-10-30 MED ORDER — VERAPAMIL HCL ER 240 MG PO TBCR
240.0000 mg | EXTENDED_RELEASE_TABLET | Freq: Every day | ORAL | Status: DC
  Administered 2016-10-30: 240 mg via ORAL
  Filled 2016-10-30: qty 1

## 2016-10-30 MED ORDER — ACETAMINOPHEN 650 MG RE SUPP: 650.0000 mg | Freq: Four times a day (QID) | RECTAL | Status: DC | PRN

## 2016-10-30 MED ORDER — ONDANSETRON HCL 4 MG/2ML IJ SOLN: 4.0000 mg | Freq: Four times a day (QID) | INTRAMUSCULAR | Status: DC | PRN

## 2016-10-30 MED ORDER — CABOZANTINIB S-MALATE 20 MG PO TABS: 1.0000 | ORAL_TABLET | Freq: Every evening | ORAL | Status: DC

## 2016-10-30 MED ORDER — ONDANSETRON HCL 4 MG PO TABS: 4.0000 mg | ORAL_TABLET | Freq: Four times a day (QID) | ORAL | Status: DC | PRN

## 2016-10-30 MED ORDER — POTASSIUM CHLORIDE CRYS ER 20 MEQ PO TBCR
20.0000 meq | EXTENDED_RELEASE_TABLET | Freq: Two times a day (BID) | ORAL | Status: DC
  Administered 2016-10-30 – 2016-10-31 (×2): 20 meq via ORAL
  Filled 2016-10-30 (×2): qty 1

## 2016-10-30 MED ORDER — POTASSIUM CHLORIDE CRYS ER 20 MEQ PO TBCR
40.0000 meq | EXTENDED_RELEASE_TABLET | Freq: Once | ORAL | Status: AC
  Administered 2016-10-30: 40 meq via ORAL
  Filled 2016-10-30: qty 2

## 2016-10-30 MED ORDER — ACETAMINOPHEN 325 MG PO TABS
650.0000 mg | ORAL_TABLET | Freq: Four times a day (QID) | ORAL | Status: DC | PRN
Start: 2016-10-30 — End: 2016-10-31

## 2016-10-30 MED ORDER — HYDROCHLOROTHIAZIDE 25 MG PO TABS
25.0000 mg | ORAL_TABLET | Freq: Every day | ORAL | Status: DC
  Administered 2016-10-31: 25 mg via ORAL
  Filled 2016-10-30: qty 1

## 2016-10-30 MED ORDER — LEVOFLOXACIN 500 MG PO TABS
500.0000 mg | ORAL_TABLET | Freq: Every day | ORAL | Status: DC
  Administered 2016-10-30: 500 mg via ORAL
  Filled 2016-10-30: qty 1

## 2016-10-30 MED ORDER — SODIUM CHLORIDE 0.9% FLUSH
3.0000 mL | Freq: Two times a day (BID) | INTRAVENOUS | Status: DC
  Administered 2016-10-31: 3 mL via INTRAVENOUS

## 2016-10-30 MED ORDER — LEVOTHYROXINE SODIUM 50 MCG PO TABS
50.0000 ug | ORAL_TABLET | Freq: Every day | ORAL | Status: DC
  Administered 2016-10-31: 50 ug via ORAL
  Filled 2016-10-30: qty 1

## 2016-10-30 MED ORDER — PROPRANOLOL HCL 10 MG PO TABS
20.0000 mg | ORAL_TABLET | Freq: Two times a day (BID) | ORAL | Status: DC
  Administered 2016-10-30 – 2016-10-31 (×2): 20 mg via ORAL
  Filled 2016-10-30 (×2): qty 2

## 2016-10-30 MED ORDER — BENZONATATE 200 MG PO CAPS: 200.0000 mg | ORAL_CAPSULE | Freq: Three times a day (TID) | ORAL | 1 refills | Status: DC | PRN

## 2016-10-30 MED ORDER — BENZONATATE 100 MG PO CAPS: 200.0000 mg | ORAL_CAPSULE | Freq: Three times a day (TID) | ORAL | Status: DC | PRN

## 2016-10-30 MED ORDER — CABOZANTINIB S-MALATE 20 MG PO TABS
1.0000 | ORAL_TABLET | ORAL | Status: AC
  Administered 2016-10-31: 1 via ORAL
  Filled 2016-10-30: qty 1

## 2016-10-30 MED ORDER — FINASTERIDE 5 MG PO TABS
5.0000 mg | ORAL_TABLET | Freq: Every day | ORAL | Status: DC
  Administered 2016-10-31: 5 mg via ORAL
  Filled 2016-10-30: qty 1

## 2016-10-30 MED ORDER — LEVOFLOXACIN 500 MG PO TABS: 500.0000 mg | ORAL_TABLET | Freq: Every day | ORAL | 0 refills | Status: DC

## 2016-10-30 MED ORDER — DOXAZOSIN MESYLATE 8 MG PO TABS
8.0000 mg | ORAL_TABLET | Freq: Every day | ORAL | Status: DC
  Administered 2016-10-31: 8 mg via ORAL
  Filled 2016-10-30: qty 1

## 2016-10-30 NOTE — Progress Notes (Signed)
Pre visit review using our clinic review tool, if applicable. No additional management support is needed unless otherwise documented below in the visit note. 

## 2016-10-30 NOTE — Telephone Encounter (Signed)
[facility] called CXR report which is in epic. Copy of report taken to Dr Damita Dunnings.[name]

## 2016-10-30 NOTE — Progress Notes (Signed)
Cough.  Still on baseline meds for colon cancer.  Started about 1 week ago.  Taking mucinex.  Chest feels heavy.  No fevers.  No vomiting.  Coughing a lot, in fits.  Some sputum, greenish.  No ST.  Ears and nose w/o complaint.  He'll get SOB with walking and this is atypical for him.  He doesn't feel sick enough to need to go to ER.  Speaking in complete sentences.    Ongoing cancer treatment noted. He has a history of pleural effusions. These have been monitored previously with CT.  PMH and SH reviewed  ROS: Per HPI unless specifically indicated in ROS section   Meds, vitals, and allergies reviewed.   nad Normocephalic atraumatic Mucous membranes moist Neck supple with shotty lymphadenopathy bilaterally Regular rate and rhythm Decreased breath sounds on the left side noted. No wheezes on the right. Speaking in complete sentences. Does not look short of breath at rest. Abdomen soft nontender positive bowel sounds Extremities with 1+ BLE edema.   Skin without rash. No bruising.

## 2016-10-30 NOTE — H&P (Signed)
History and Physical    Hunter Hardin. QQV:[id number] DOB: [birth year] DOA: 10/30/2016  PCP: Elsie Stain, MD  Oncology: Dr. Alen Blew  Patient coming from: HOME  Chief Complaint: Progressive DOE  HPI: Hunter Hardin. is a 81 y.o. 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  The patient has a known history of metastatic cancer as noted above, and he also knows that he has chronic bilateral pleural effusions that have been monitored intermittently with imaging.  He saw his PCP today for evaluation of 2-3 days of progressive DOE.  The patient denies shortness of breath at rest.  He denies chest pain, but says that he has had a pressure that feels like a weight on his chest.  He has had a cough productive of green sputum, but he has not had fever.  He denies sick contacts.  He has had his flu shot this year.  No light-headedness.  He has only taken OTC mucinex for his symptoms.  He was prescribed levaquin and tessalon perles today, which he has filled but not yet started, because he was advised to report to the ED.  ED Course: Outpatient chest xray shows LARGE left sided pleural effusion with mediastinal shift to the right.  Upon evaluation in the ED, the patient is hemodynamically stable without supplemental oxygen requirement.  IR contacted by the ED attending; the plan is to perform thoracentesis in the morning.  Hospitalist asked to place in observation.  Review of Systems: Chronic LE edema.  He adamantly denies any known cardiac history.  Otherwise,  10 systems reviewed and negative except as stated in the HPI.   Past Medical History:  Diagnosis Date  . Colon cancer ([place]) dx'd 01/2013  . Elevated PSA    H/O  . GERD (gastroesophageal reflux disease)   . HLD (hyperlipidemia)   . Hypertension    . Hypothyroidism   . Metastasis to lung ([place]) dx'd 01/2013  . Protein calorie malnutrition ([place]) 06/13/2013  . renal ca dx'd 01/2013    Past Surgical History:  Procedure Laterality Date  . CHOLECYSTECTOMY  early 74's  . COLONOSCOPY  01/2013  . LAPAROSCOPIC PARTIAL COLECTOMY N/A 03/06/2013   Procedure: LAPAROSCOPic assisted right hemi COLECTOMY;  Surgeon: Leighton Ruff, MD;  Location: [facility];  Service: General;  Laterality: N/A;  . VASECTOMY       reports that he quit smoking about 30 years ago. His smoking use included Cigarettes. He has a 60.00 pack-year smoking history. He has never used smokeless tobacco. He reports that he drinks alcohol. He reports that he does not use drugs.  No Known Allergies  Family History  Problem Relation Age of Onset  . Hypertension Mother   . Hyperlipidemia Mother   . Dementia Mother   . Heart disease Father     ? MI, CAD  . Cancer Brother     oral/tonsil cancer 2012  . Cancer Sister     breast cancer  . Diabetes Cousin   . Colon cancer Neg Hx   . Prostate cancer Neg Hx      Prior to Admission medications   Medication Sig Start Date End Date Taking? Authorizing Provider  cabozantinib S-Malate (CABOMETYX) 20 MG TABS Take 1 tablet by mouth daily. Patient taking differently: Take 1 tablet by  mouth every evening.  05/21/16  Yes [name], MD  finasteride (PROSCAR) 5 MG tablet Take 5 mg by mouth daily. 01/09/16  Yes Historical Provider, MD  fish oil-omega-3 fatty acids 1000 MG capsule Take 1 g by mouth 2 (two) times daily.   Yes Historical Provider, MD  hydrochlorothiazide (HYDRODIURIL) 25 MG tablet TAKE 1 TABLET DAILY 06/03/16  Yes Tonia Ghent, MD  KLOR-CON M20 20 MEQ tablet TAKE 1 TABLET TWICE A DAY 08/17/16  Yes Tonia Ghent, MD  lactose free nutrition (BOOST) LIQD Take 237 mLs by mouth daily.   Yes Historical Provider, MD  levothyroxine (SYNTHROID, LEVOTHROID) 50 MCG tablet TAKE 2 TABLETS ON SUNDAY AND 1 TABLET ON ALL OTHER DAYS (8  TABLETS A WEEK TOTAL) 08/17/16  Yes Tonia Ghent, MD  propranolol (INDERAL) 40 MG tablet Take 0.5 tablets (20 mg total) by mouth 2 (two) times daily. 06/05/15  Yes Tonia Ghent, MD  verapamil (CALAN-SR) 240 MG CR tablet TAKE 1 TABLET DAILY 06/15/16  Yes Tonia Ghent, MD  vitamin C (ASCORBIC ACID) 500 MG tablet Take 500 mg by mouth daily.   Yes Historical Provider, MD  benzonatate (TESSALON) 200 MG capsule Take 1 capsule (200 mg total) by mouth 3 (three) times daily as needed for cough. 10/30/16   Tonia Ghent, MD  doxazosin (CARDURA) 8 MG tablet TAKE 1 TABLET DAILY 06/15/16   Tonia Ghent, MD  hydrocortisone 2.5 % cream Apply topically 3 (three) times daily as needed. 04/17/16   Venia Carbon, MD  levofloxacin (LEVAQUIN) 500 MG tablet Take 1 tablet (500 mg total) by mouth daily. 10/30/16   Tonia Ghent, MD  loperamide (IMODIUM A-D) 2 MG tablet Take 2 mg by mouth as needed for diarrhea or loose stools.    Historical Provider, MD  ondansetron (ZOFRAN) 8 MG tablet Take 1 tablet (8 mg total) by mouth every 8 (eight) hours as needed. for nausea 02/26/16   Tonia Ghent, MD    Physical Exam: Vitals:   10/30/16 1748 10/30/16 1750 10/30/16 1800 10/30/16 1856  BP: 157/84 157/84 159/90 151/74  Pulse:   60 (!) 58  Resp:  '16 18 19  '$ SpO2:   95% 94%      Constitutional: NAD, calm, comfortable, reading a book in the ED.  Hard of hearing at baseline. Vitals:   10/30/16 1748 10/30/16 1750 10/30/16 1800 10/30/16 1856  BP: 157/84 157/84 159/90 151/74  Pulse:   60 (!) 58  Resp:  '16 18 19  '$ SpO2:   95% 94%   Eyes: PERRL, lids and conjunctivae normal ENMT: Mucous membranes are moist. Posterior pharynx clear of any exudate or lesions. Normal dentition.  Neck: normal appearance, supple Respiratory: Crackles on the right, essentially no air movement/absent breath sounds on the left with dullness to percussion.  No wheezing.  Normal respiratory effort without accessory muscle use at rest.      Cardiovascular: Normal rate, regular rhythm, no murmurs / rubs / gallops. 1-2+ pitting edema bilaterally.  2+ pedal pulses. GI: abdomen is soft and compressible.  No distention.  No tenderness.  Bowel sounds are present. Musculoskeletal:  No joint deformity in upper and lower extremities. Good ROM, no contractures. Normal muscle tone.  Skin: no rashes, pale, dry Neurologic: No focal deficits. Psychiatric: Normal judgment and insight. Alert and oriented x 3. Normal mood.     Labs on Admission: I have personally reviewed following labs and imaging studies  CBC:  Recent  Labs Lab 10/30/16 1701  WBC 9.2  NEUTROABS 5.1  HGB 12.7*  HCT 37.9*  MCV 100.3*  PLT 308*   Basic Metabolic Panel:  Recent Labs Lab 10/30/16 1701  NA 140  K 3.2*  CL 108  CO2 24  GLUCOSE 98  BUN 17  CREATININE 0.99  CALCIUM 7.8*   GFR: Estimated Creatinine Clearance: 55.3 mL/min (by C-G formula based on SCr of 0.99 mg/dL). Liver Function Tests:  Recent Labs Lab 10/30/16 1701  AST 27  ALT 28  ALKPHOS 69  BILITOT 0.6  PROT 6.3*  ALBUMIN 2.8*   Coagulation Profile:  Recent Labs Lab 10/30/16 1701  INR 1.10     Radiological Exams on Admission: Dg Chest 2 View  Result Date: 10/30/2016 CLINICAL DATA:  One week of cough. No fever. Dyspnea on exertion. 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 EXAM: CHEST  2 VIEW COMPARISON:  Portable chest x-ray of May 03, 2016. FINDINGS: There has been interval development of a large left pleural effusion. There is mild shift of the mediastinum toward the right. The interstitial markings of the right lung are mildly prominent but less conspicuous than on the previous study. The left heart border is obscured. There is calcification in the wall of the aortic arch. IMPRESSION: Interval development of a large left-sided pleural effusion with shift of the mediastinum toward the right. These results will be called to the ordering clinician or  representative by the Radiologist Assistant, and communication documented in the PACS or zVision Dashboard. Electronically Signed   By: David  Martinique M.D.   On: 10/30/2016 16:19     Assessment/Plan Principal Problem:   Pleural effusion Active Problems:   Hypothyroidism   Essential hypertension   Colon cancer (HCC)   Renal cell cancer (H[facility])   Thrombocytopenia (HCC)   Edema      Enlarged pleural effusion, likely malignant, causing mediastinal shift and cough, chest tightness, and DOE.  In my opinion, underlying pneumonia cannot be excluded, though it has not been proven.  He has had abnormal sputum production though he has not been febrile.  Because he is an immunocompromised host, I believe continuation of empiric antibiotics is reasonable.  He will need repeat imaging once his pleural effusion is drained. --Telemetry monitoring --NPO after midnight for diagnostic/therapeutic thoracentesis in the AM.  I have ordered the following studies for the pleural fluid: cell count with diff, culture, LDH, total protein, and cytology.  I have ordered serum LDH for the morning.   --Incentive spirometry --Anti-tussive as needed --Empiric levaquin '500mg'$  po daily --Sputum culture if the patient is able to produce a sample --Supplemental oxygen as needed --Expect he will go home tomorrow if no complications from procedure, and post-procedure imaging does not show any new alarming features. --Oncology and/or PCP will need to follow-up pleural fluid studies --Low clinical index of suspicion for ACS at this time  HTN --Verapamil, propranolol, HCTZ  Hypothyroidism --Levothyroxine 70mg daily  Hypokalemia --Replacement ordered, in addition to his home dose of potassium  BPH --Finasteride, cardura  Metastatic renal cancer --Cabometyx '20mg'$  daily  Chronic LE edema --Consider echo as outpatient.  No additional orders for diuresis given at this time.   DVT prophylaxis: SCDs Code Status:  FULL Family Communication: Patient alone in the ED at time of admission. Disposition Plan: Expect he will discharge to home tomorrow if stable after thoracentesis. Consults called: Interventional Radiology Admission status: Place in observation with telemetry.   TIME SPENT: 60 minutes  Eber Jones MD [facility] Pager [phone]  If 7PM-7AM, please contact night-coverage [email or web address] Password [facility]  10/30/2016, 7:34 PM

## 2016-10-30 NOTE — ED Provider Notes (Signed)
[place] DEPT Provider Note   CSN: [id number] Arrival date & time: 10/30/16  1737     History   Chief Complaint Chief Complaint  Patient presents with  . Pleural Effusion    Mediastinum Shifted    HPI Hunter Hardin. is a 81 y.o. male.  HPI Pt comes in with cc of abnormal Xrays. Pt has hx of renal CA and colon CA (s/p hemicolectomy) and mets to the liver. Pt has pleural effusion on the L side that he is aware of, but the repeat Xrays shows the effusion to be quite large now, and causing mass effect to the mediastinum. Pt has exertional dyspnea - after walking about 100 yards. Pt has some chest discomfort as well. + cough - no fevers. Cough is not productive.  Past Medical History:  Diagnosis Date  . Colon cancer ([place]) dx'd 01/2013  . Elevated PSA    H/O  . GERD (gastroesophageal reflux disease)   . HLD (hyperlipidemia)   . Hypertension   . Hypothyroidism   . Metastasis to lung ([place]) dx'd 01/2013  . Protein calorie malnutrition ([place]) 06/13/2013  . renal ca dx'd 01/2013    Patient Active Problem List   Diagnosis Date Noted  . Left groin hernia 06/17/2016  . Pleural effusion 06/17/2016  . External hemorrhoid 04/17/2016  . Advance care planning 06/06/2015  . Edema 04/03/2014  . Thrombocytopenia, unspecified 02/23/2014  . Protein calorie malnutrition ([place]) 06/13/2013  . Renal cell cancer ([place]) 05/21/2013  . Colon cancer ([place]) 03/07/2013  . Preoperative evaluation to rule out surgical contraindication 03/03/2013  . Medicare annual wellness visit, initial 11/04/2012  . HYPERGLYCEMIA 05/01/2007  . Hypothyroidism 04/29/2007  . HLD (hyperlipidemia) 04/29/2007  . Essential hypertension 04/29/2007  . ELEVATED PROSTATE SPECIFIC ANTIGEN 04/29/2007    Past Surgical History:  Procedure Laterality Date  . CHOLECYSTECTOMY  early 28's  . COLONOSCOPY  01/2013  . LAPAROSCOPIC PARTIAL COLECTOMY N/A 03/06/2013   Procedure: LAPAROSCOPic assisted right hemi COLECTOMY;   Surgeon: Leighton Ruff, MD;  Location: [facility];  Service: General;  Laterality: N/A;  . VASECTOMY         Home Medications    Prior to Admission medications   Medication Sig Start Date End Date Taking? Authorizing Provider  cabozantinib S-Malate (CABOMETYX) 20 MG TABS Take 1 tablet by mouth daily. Patient taking differently: Take 1 tablet by mouth every evening.  05/21/16  Yes [name], MD  finasteride (PROSCAR) 5 MG tablet Take 5 mg by mouth daily. 01/09/16  Yes Historical Provider, MD  fish oil-omega-3 fatty acids 1000 MG capsule Take 1 g by mouth 2 (two) times daily.   Yes Historical Provider, MD  hydrochlorothiazide (HYDRODIURIL) 25 MG tablet TAKE 1 TABLET DAILY 06/03/16  Yes Tonia Ghent, MD  KLOR-CON M20 20 MEQ tablet TAKE 1 TABLET TWICE A DAY 08/17/16  Yes Tonia Ghent, MD  lactose free nutrition (BOOST) LIQD Take 237 mLs by mouth daily.   Yes Historical Provider, MD  levothyroxine (SYNTHROID, LEVOTHROID) 50 MCG tablet TAKE 2 TABLETS ON SUNDAY AND 1 TABLET ON ALL OTHER DAYS (8 TABLETS A WEEK TOTAL) 08/17/16  Yes Tonia Ghent, MD  propranolol (INDERAL) 40 MG tablet Take 0.5 tablets (20 mg total) by mouth 2 (two) times daily. 06/05/15  Yes Tonia Ghent, MD  verapamil (CALAN-SR) 240 MG CR tablet TAKE 1 TABLET DAILY 06/15/16  Yes Tonia Ghent, MD  vitamin C (ASCORBIC ACID) 500 MG tablet Take 500 mg by  mouth daily.   Yes Historical Provider, MD  benzonatate (TESSALON) 200 MG capsule Take 1 capsule (200 mg total) by mouth 3 (three) times daily as needed for cough. 10/30/16   Tonia Ghent, MD  doxazosin (CARDURA) 8 MG tablet TAKE 1 TABLET DAILY 06/15/16   Tonia Ghent, MD  hydrocortisone 2.5 % cream Apply topically 3 (three) times daily as needed. 04/17/16   Venia Carbon, MD  levofloxacin (LEVAQUIN) 500 MG tablet Take 1 tablet (500 mg total) by mouth daily. 10/30/16   Tonia Ghent, MD  loperamide (IMODIUM A-D) 2 MG tablet Take 2 mg by mouth as needed for diarrhea or  loose stools.    Historical Provider, MD  ondansetron (ZOFRAN) 8 MG tablet Take 1 tablet (8 mg total) by mouth every 8 (eight) hours as needed. for nausea 02/26/16   Tonia Ghent, MD    Family History Family History  Problem Relation Age of Onset  . Hypertension Mother   . Hyperlipidemia Mother   . Dementia Mother   . Heart disease Father     ? MI, CAD  . Cancer Brother     oral/tonsil cancer 2012  . Cancer Sister     breast cancer  . Diabetes Cousin   . Colon cancer Neg Hx   . Prostate cancer Neg Hx     Social History Social History  Substance Use Topics  . Smoking status: Former Smoker    Packs/day: 2.00    Years: 30.00    Types: Cigarettes    Quit date: 12/21/1985  . Smokeless tobacco: Never Used  . Alcohol use 0.0 oz/week     Comment: beer occassionally     Allergies   Patient has no known allergies.   Review of Systems Review of Systems  ROS 10 Systems reviewed and are negative for acute change except as noted in the HPI.     Physical Exam Updated Vital Signs BP 151/74 (BP Location: Right Arm)   Pulse (!) 58   Resp 19   SpO2 94%   Physical Exam  Constitutional: He is oriented to person, place, and time. He appears well-developed.  HENT:  Head: Normocephalic and atraumatic.  Eyes: Conjunctivae and EOM are normal. Pupils are equal, round, and reactive to light.  Neck: Normal range of motion. Neck supple.  Cardiovascular: Normal rate and regular rhythm.   Pulmonary/Chest: Effort normal.  Diminished breath sounds on the L side  Abdominal: Soft. Bowel sounds are normal. He exhibits no distension. There is no tenderness. There is no rebound and no guarding.  Neurological: He is alert and oriented to person, place, and time. Abnormal muscle tone: .scribes.  Skin: Skin is warm.  Nursing note and vitals reviewed.    ED Treatments / Results  Labs (all labs ordered are listed, but only abnormal results are displayed) Labs Reviewed  CBC WITH  DIFFERENTIAL/PLATELET - Abnormal; Notable for the following:       Result Value   RBC 3.78 (*)    Hemoglobin 12.7 (*)    HCT 37.9 (*)    MCV 100.3 (*)    Platelets 148 (*)    All other components within normal limits  COMPREHENSIVE METABOLIC PANEL - Abnormal; Notable for the following:    Potassium 3.2 (*)    Calcium 7.8 (*)    Total Protein 6.3 (*)    Albumin 2.8 (*)    All other components within normal limits  PROTIME-INR    EKG  EKG  Interpretation None       Radiology Dg Chest 2 View  Result Date: 10/30/2016 CLINICAL DATA:  One week of cough. No fever. Dyspnea on exertion. 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 EXAM: CHEST  2 VIEW COMPARISON:  Portable chest x-ray of May 03, 2016. FINDINGS: There has been interval development of a large left pleural effusion. There is mild shift of the mediastinum toward the right. The interstitial markings of the right lung are mildly prominent but less conspicuous than on the previous study. The left heart border is obscured. There is calcification in the wall of the aortic arch. IMPRESSION: Interval development of a large left-sided pleural effusion with shift of the mediastinum toward the right. These results will be called to the ordering clinician or representative by the Radiologist Assistant, and communication documented in the PACS or zVision Dashboard. Electronically Signed   By: David  Martinique M.D.   On: 10/30/2016 16:19    Procedures Procedures (including critical care time)  Medications Ordered in ED Medications - No data to display   Initial Impression / Assessment and Plan / ED Course  I have reviewed the triage vital signs and the nursing notes.  Pertinent labs & imaging results that were available during my care of the patient were reviewed by me and considered in my medical decision making (see chart for details).  Clinical Course     Pt comes in with cc of abnormal Xrays. He has a large pleural  effusion - but he is stable and seems like this is a slow developing process. I have called IR - and they will place a thoracentesis catheter tomorrow.  Final Clinical Impressions(s) / ED Diagnoses   Final diagnoses:  Pleural effusion on left  Pleural effusion    New Prescriptions New Prescriptions   No medications on file     Varney Biles, MD 11/01/16 1550

## 2016-10-30 NOTE — ED Triage Notes (Addendum)
Pt sent from by PCP for large left pleural effusion with mediastinal shift. Absent lung sounds lower left lobe. X-ray in chart. Hx renal cancer with mets to lung.

## 2016-10-30 NOTE — Patient Instructions (Signed)
Go to the lab on the way out.  We'll contact you with your lab and chest xray report. Start the antibiotics and use tessalon for the cough.  Update me Monday.  Take care.  Glad to see you.

## 2016-10-31 ENCOUNTER — Observation Stay (HOSPITAL_COMMUNITY): Payer: Medicare Other

## 2016-10-31 DIAGNOSIS — I10 Essential (primary) hypertension: Secondary | ICD-10-CM | POA: Diagnosis not present

## 2016-10-31 DIAGNOSIS — C189 Malignant neoplasm of colon, unspecified: Secondary | ICD-10-CM

## 2016-10-31 DIAGNOSIS — E039 Hypothyroidism, unspecified: Secondary | ICD-10-CM

## 2016-10-31 DIAGNOSIS — J90 Pleural effusion, not elsewhere classified: Secondary | ICD-10-CM

## 2016-10-31 DIAGNOSIS — R846 Abnormal cytological findings in specimens from respiratory organs and thorax: Secondary | ICD-10-CM | POA: Diagnosis not present

## 2016-10-31 DIAGNOSIS — C649 Malignant neoplasm of unspecified kidney, except renal pelvis: Secondary | ICD-10-CM

## 2016-10-31 LAB — LACTATE DEHYDROGENASE, PLEURAL OR PERITONEAL FLUID: LD FL: 269 U/L — AB (ref 3–23)

## 2016-10-31 LAB — BODY FLUID CELL COUNT WITH DIFFERENTIAL
Lymphs, Fluid: 44 %
Monocyte-Macrophage-Serous Fluid: 50 % (ref 50–90)
Neutrophil Count, Fluid: 6 % (ref 0–25)
Total Nucleated Cell Count, Fluid: 74 cu mm (ref 0–1000)

## 2016-10-31 LAB — LACTATE DEHYDROGENASE: LDH: 160 U/L (ref 98–192)

## 2016-10-31 LAB — BASIC METABOLIC PANEL
ANION GAP: 7 (ref 5–15)
BUN: 19 mg/dL (ref 6–20)
CHLORIDE: 109 mmol/L (ref 101–111)
CO2: 26 mmol/L (ref 22–32)
CREATININE: 0.92 mg/dL (ref 0.61–1.24)
Calcium: 7.7 mg/dL — ABNORMAL LOW (ref 8.9–10.3)
GFR calc non Af Amer: >60 mL/min (ref >60)
Glucose, Bld: 83 mg/dL (ref 65–99)
POTASSIUM: 3.3 mmol/L — AB (ref 3.5–5.1)
Sodium: 142 mmol/L (ref 135–145)

## 2016-10-31 LAB — GRAM STAIN

## 2016-10-31 LAB — PROTEIN, BODY FLUID: TOTAL PROTEIN, FLUID: 3.3 g/dL

## 2016-10-31 NOTE — Progress Notes (Signed)
Patient o2 saturations sustained in the 90's while ambulating in the hall.  The lowest it dropped was 91%.  Pt reported no shortness of breath.  MD made aware.

## 2016-10-31 NOTE — Progress Notes (Signed)
WEnt over all discharge information with patient and family.  All questions answered.  Discharge paperwork and medications from pharmacy given to patient.  Pt discharged via wheelchair.

## 2016-10-31 NOTE — Discharge Summary (Signed)
Physician Discharge Summary  Hunter Hardin. IRJ:[id number] DOB: 1935-06-10 DOA: 10/30/2016  PCP: Hunter Stain, MD  Admit date: 10/30/2016 Discharge date: 10/31/2016  Admitted From: Home Disposition:  Home  Recommendations for Outpatient Follow-up:  1. Follow up with PCP Dr. Damita Hardin in 1 week 2. Follow up with Hematology/Oncology Hunter Hardin 3. Follow up with Urology as an outpatient 4. Continue Antibiotics Prescribed to you prior to Admission 5. Please obtain BMP/CBC within 1 week to check Potassium 6. Repeat a CXR as an outpatient.  7. Please follow up on the following pending results: FLUID ANALYSIS FROM [place]: No Equipment/Devices: None  Discharge Condition: Stable CODE STATUS: Full Diet recommendation: Heart Healthy   Brief/Interim Summary: Hunter Brueggemann. is a 81 y.o. 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  The patient has a known history of metastatic cancer as noted above, and he also knows that he has chronic bilateral pleural effusions that have been monitored intermittently with imaging. He saw his PCP yesterday for evaluation of 2-3 days of progressive DOE.  The patient denies shortness of breath at rest.  He denies chest pain, but says that he has had a pressure that feels like a weight on his chest.  He has had a cough productive of green sputum, but he has not had fever.  He denies sick contacts.  He has had his flu shot this year.  No light-headedness.  He has only taken OTC mucinex for his symptoms.  He was prescribed levaquin and tessalon perles yesterday, which he has filled but not yet started, because he was advised to report to the Ed. He was found to have LARGE left sided pleural effusion with mediastinal shift to the right on Outpatient  Xray. Patient underwent a Thoracentesis by IR which drained 1.6 L. Fluid Analysis will needed to be followed up on. Patient was medically stable for D/C and was D/C'd home and told to follow up with PCP and primary Hematologist/Oncologist Hunter Hardin.  Discharge Diagnoses:  Principal Problem:   Pleural effusion Active Problems:   Hypothyroidism   Essential hypertension   Colon cancer (H[facility])   Renal cell cancer (H[facility])   Thrombocytopenia (H[facility])   Edema  Enlarged pleural effusion, likely malignant, causing mediastinal shift and cough, chest tightness, and DOE s/p Thoracentesis  -Has had abnormal sputum production though he has not been febrile.  Because he is an immunocompromised host, I believe continuation of empiric antibiotics is reasonable.  -Following studies for the pleural fluid need to be followed up on: cell count with diff, culture, LDH, total protein, and cytology.   -C/w Incentive spirometry -Anti-tussive as needed -Empiric levaquin '500mg'$  po daily -Oncology and/or PCP will need to follow-up pleural fluid studies -Ambulated well without need for Home O2; Breathing Significantly Improved.  -Follow CXR read stated Pleural Effusion looked worse however clinically patient seemed improved and sounded good. Possible Atelectasis. Repeat CXR as an outpatient.  HTN - C/w Verapamil, propranolol, HCTZ  Hypothyroidism -C/w Levothyroxine 79mg daily  Hypokalemia -Replacement ordered,  -Will need BMP as an outpatient  BPH -Indwelling Foley -C/w Finasteride, cardura -Follow up with Urology  Metastatic renal cancer -C/w Cabometyx '20mg'$  daily -Follow up with Dr. SAlen Hardin Chronic LE edema -Consider echo as outpatient.   Discharge Instructions  Discharge Instructions    Call MD for:  difficulty breathing, headache or visual disturbances    Complete by:  As directed    Call MD for:  persistant nausea and vomiting    Complete by:  As directed    Call MD for:  redness,  tenderness, or signs of infection (pain, swelling, redness, odor or green/yellow discharge around incision site)    Complete by:  As directed    Call MD for:  severe uncontrolled pain    Complete by:  As directed    Diet - low sodium heart healthy    Complete by:  As directed    Discharge instructions    Complete by:  As directed    Follow up with PCP, Urology, and Hematology/Oncology as an outpatient. Please also follow up on Studies obtained from Thoracentesis. Take all medications as prescribed. If symptoms worsen or change please return to the ER or PCP for evaltuation.   Increase activity slowly    Complete by:  As directed        Medication List    TAKE these medications   benzonatate 200 MG capsule Commonly known as:  TESSALON Take 1 capsule (200 mg total) by mouth 3 (three) times daily as needed for cough.   cabozantinib S-Malate 20 MG Tabs Commonly known as:  CABOMETYX Take 1 tablet by mouth daily. What changed:  when to take this   doxazosin 8 MG tablet Commonly known as:  CARDURA TAKE 1 TABLET DAILY   finasteride 5 MG tablet Commonly known as:  PROSCAR Take 5 mg by mouth daily.   fish oil-omega-3 fatty acids 1000 MG capsule Take 1 g by mouth 2 (two) times daily.   hydrochlorothiazide 25 MG tablet Commonly known as:  HYDRODIURIL TAKE 1 TABLET DAILY   hydrocortisone 2.5 % cream Apply topically 3 (three) times daily as needed.   KLOR-CON M20 20 MEQ tablet Generic drug:  potassium chloride SA TAKE 1 TABLET TWICE A DAY   lactose free nutrition Liqd Take 237 mLs by mouth daily.   levofloxacin 500 MG tablet Commonly known as:  LEVAQUIN Take 1 tablet (500 mg total) by mouth daily.   levothyroxine 50 MCG tablet Commonly known as:  SYNTHROID, LEVOTHROID TAKE 2 TABLETS ON SUNDAY AND 1 TABLET ON ALL OTHER DAYS (8 TABLETS A WEEK TOTAL)   loperamide 2 MG tablet Commonly known as:  IMODIUM A-D Take 2 mg by mouth as needed for diarrhea or loose stools.    ondansetron 8 MG tablet Commonly known as:  ZOFRAN Take 1 tablet (8 mg total) by mouth every 8 (eight) hours as needed. for nausea   propranolol 40 MG tablet Commonly known as:  INDERAL Take 0.5 tablets (20 mg total) by mouth 2 (two) times daily.   verapamil 240 MG CR tablet Commonly known as:  CALAN-SR TAKE 1 TABLET DAILY   vitamin C 500 MG tablet Commonly known as:  ASCORBIC ACID Take 500 mg by mouth daily.       No Known Allergies  Consultations:  Interventional Radiology  Procedures/Studies: Dg Chest 1 View  Result Date: 10/31/2016 CLINICAL DATA:  Status post left thoracentesis. EXAM: CHEST 1 VIEW COMPARISON:  October 30, 2016 FINDINGS: There is a large left pleural effusion. The effusion looks larger when compared to yesterday's study despite to the interval thoracentesis. No pneumothorax. Small effusion remains on the right. No other interval changes. IMPRESSION: Large left pleural effusion, apparently larger in the interval despite interval thoracentesis, with  no pneumothorax. Small right effusion. Suggested mild edema. Electronically Signed   By: Dorise Bullion III M.D   On: 10/31/2016 10:55   Dg Chest 2 View  Result Date: 10/30/2016 CLINICAL DATA:  One week of cough. No fever. Dyspnea on exertion. 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 EXAM: CHEST  2 VIEW COMPARISON:  Portable chest x-ray of May 03, 2016. FINDINGS: There has been interval development of a large left pleural effusion. There is mild shift of the mediastinum toward the right. The interstitial markings of the right lung are mildly prominent but less conspicuous than on the previous study. The left heart border is obscured. There is calcification in the wall of the aortic arch. IMPRESSION: Interval development of a large left-sided pleural effusion with shift of the mediastinum toward the right. These results will be called to the ordering clinician or representative by the Radiologist  Assistant, and communication documented in the PACS or zVision Dashboard. Electronically Signed   By: David  Martinique M.D.   On: 10/30/2016 16:19   US Thoracentesis Asp Pleural Space W/img Guide  Result Date: 10/31/2016 INDICATION: Symptomatic left sided pleural effusion EXAM: US THORACENTESIS ASP PLEURAL SPACE W/IMG GUIDE COMPARISON:  None. MEDICATIONS: 10 cc 1% lidocaine. COMPLICATIONS: None immediate. TECHNIQUE: Informed written consent was obtained from the patient after a discussion of the risks, benefits and alternatives to treatment. A timeout was performed prior to the initiation of the procedure. Initial ultrasound scanning demonstrates a left pleural effusion. The lower chest was prepped and draped in the usual sterile fashion. 1% lidocaine was used for local anesthesia. Under direct ultrasound guidance, a 19 gauge, 7-cm, Yueh catheter was introduced. An ultrasound image was saved for documentation purposes. the thoracentesis was performed. The catheter was removed and a dressing was applied. The patient tolerated the procedure well without immediate post procedural complication. The patient was escorted to have an upright chest radiograph. FINDINGS: A total of approximately 1.6 liters of yellow fluid was removed. Requested samples were sent to the laboratory. IMPRESSION: Successful ultrasound-guided left sided thoracentesis yielding 1.6 liters of pleural fluid. Read by Lavonia Drafts [facility] Electronically Signed   By: Aletta Edouard M.D.   On: 10/31/2016 12:47    IR THORACENTESIS 1.6 L yellow fluid (maximim for this PA--first time procedure)  Tolerated well  sent for labs per MD  Subjective: Seen and examined and looked good and stated he felt so much better breathing. No CP or SOB. No other complaints and ready to go home.  Discharge Exam: Vitals:   10/31/16 1017 10/31/16 1341  BP: 126/67 124/71  Pulse: 63 68  Resp: 18 18  Temp:  97.8 F (36.6 C)   Vitals:   10/31/16 0945  10/31/16 1013 10/31/16 1017 10/31/16 1341  BP: 139/72 135/62 126/67 124/71  Pulse: 65 62 63 68  Resp: '18 18 18 18  '$ Temp:    97.8 F (36.6 C)  TempSrc:    Oral  SpO2: 94% 96% 93% 97%  Weight:      Height:        General: Pt is alert, awake, not in acute distress Cardiovascular: RRR, S1/S2 +, no rubs, no gallops Respiratory: Diminished bilaterally, no wheezing, no rhonchi Abdominal: Soft, NT, ND, bowel sounds + Extremities: no edema, no cyanosis Gu: Indwelling foley catheter  The results of significant diagnostics from this hospitalization (including imaging, microbiology, ancillary and laboratory) are listed below for reference.     Microbiology: No results found for this or  any previous visit (from the past 240 hour(s)).   Labs: BNP (last 3 results) No results for input(s): BNP in the last 8760 hours. Basic Metabolic Panel:  Recent Labs Lab 10/30/16 1701 10/31/16 0444  NA 140 142  K 3.2* 3.3*  CL 108 109  CO2 24 26  GLUCOSE 98 83  BUN 17 19  CREATININE 0.99 0.92  CALCIUM 7.8* 7.7*   Liver Function Tests:  Recent Labs Lab 10/30/16 1701  AST 27  ALT 28  ALKPHOS 69  BILITOT 0.6  PROT 6.3*  ALBUMIN 2.8*   No results for input(s): LIPASE, AMYLASE in the last 168 hours. No results for input(s): AMMONIA in the last 168 hours. CBC:  Recent Labs Lab 10/30/16 1621 10/30/16 1701  WBC 7.1 9.2  NEUTROABS 3,692 5.1  HGB 12.7* 12.7*  HCT 39.3 37.9*  MCV 103.1* 100.3*  PLT 153 148*   Cardiac Enzymes: No results for input(s): CKTOTAL, CKMB, CKMBINDEX, TROPONINI in the last 168 hours. BNP: Invalid input(s): POCBNP CBG: No results for input(s): GLUCAP in the last 168 hours. D-Dimer No results for input(s): DDIMER in the last 72 hours. Hgb A1c No results for input(s): HGBA1C in the last 72 hours. Lipid Profile No results for input(s): CHOL, HDL, LDLCALC, TRIG, CHOLHDL, LDLDIRECT in the last 72 hours. Thyroid function studies No results for input(s): TSH,  T4TOTAL, T3FREE, THYROIDAB in the last 72 hours.  Invalid input(s): FREET3 Anemia work up No results for input(s): VITAMINB12, FOLATE, FERRITIN, TIBC, IRON, RETICCTPCT in the last 72 hours. Urinalysis No results found for: COLORURINE, APPEARANCEUR, LABSPEC, [place], GLUCOSEU, HGBUR, BILIRUBINUR, KETONESUR, PROTEINUR, UROBILINOGEN, NITRITE, LEUKOCYTESUR Sepsis Labs Invalid input(s): PROCALCITONIN,  WBC,  LACTICIDVEN Microbiology No results found for this or any previous visit (from the past 240 hour(s)).   Time coordinating discharge: Over 30 minutes  SIGNED:  Kerney Elbe, DO [facility] 10/31/2016, 1:54 PM Pager [phone]  If 7PM-7AM, please contact night-coverage [email or web address] Password [email or web address]

## 2016-10-31 NOTE — Procedures (Signed)
  US guided Left thoracentesis  1.6 L yellow fluid (maximim for this PA--first time procedure)  Tolerated well  sent for labs per MD  CXR pending

## 2016-11-01 ENCOUNTER — Telehealth: Payer: Self-pay | Admitting: Family Medicine

## 2016-11-01 NOTE — Assessment & Plan Note (Signed)
The initial concern was for pneumonia. The plan was to check CBC, check chest x-ray, start oral Levaquin. I reviewed his x-ray, compared to previous CT. Left-sided pleural effusion is much larger. Radiology report also noted. We called patient and advised him to go to the emergency room. He did. We called ahead to the charge nurse about the pending angle toward arrival. I dont' think he needs to go by EMS but he needs to go tonight to the emergency room for likely drainage of pleural effusion. He may or may not end up needing admission. He understood the plan. Appreciate the help of all involved.

## 2016-11-01 NOTE — Telephone Encounter (Signed)
Recently with pleural effusion drained. Please get update on his breathing and his status overall. Thanks.

## 2016-11-02 ENCOUNTER — Telehealth: Payer: Self-pay

## 2016-11-02 NOTE — Telephone Encounter (Signed)
Spoke to pt. He said everything went well. He came home Saturday afternoon. Said they told him 1.3 Liters of fluid out. Not coughing or short of breath like he was.

## 2016-11-02 NOTE — Telephone Encounter (Signed)
Transition Care Management Follow-up Telephone Call    Date discharged? 10/31/16  How have you been since you were released from the hospital? [place]. Pt is still continuing antibiotics as prescribed.   Any patient concerns? Pt states he is now experiencing insomnia since discharge. Pt reports sleeping approx.. 3 hours per night.    Items Reviewed:  Medications reviewed: Yes  Allergies reviewed: Yes  Dietary changes reviewed: Yes  Referrals reviewed: Yes   Functional Questionnaire:  Independent - I Dependent - D    Activities of Daily Living (ADLs):  Pt is independent with all ADLs and iADLs. Pt is able to drive and denies any concerns with transportation.    Confirmed importance and date/time of follow-up visits scheduled YES  Provider Appointment booked with PCP 11/06/16 @ 1130  Confirmed with patient if condition begins to worsen call PCP or go to the ER.  Patient was given the office number and encouraged to call back with question or concerns: YES

## 2016-11-02 NOTE — Telephone Encounter (Signed)
Noted. Thanks.

## 2016-11-03 NOTE — Telephone Encounter (Signed)
Okay to try OTC melatonin at night to help with sleep.  Should normalize.  Thanks.

## 2016-11-03 NOTE — Telephone Encounter (Signed)
Left detailed message on voicemail.  

## 2016-11-05 LAB — CULTURE, BODY FLUID-BOTTLE: CULTURE: NO GROWTH

## 2016-11-05 LAB — CULTURE, BODY FLUID W GRAM STAIN -BOTTLE

## 2016-11-06 ENCOUNTER — Ambulatory Visit (INDEPENDENT_AMBULATORY_CARE_PROVIDER_SITE_OTHER)
Admission: RE | Admit: 2016-11-06 | Discharge: 2016-11-06 | Disposition: A | Discharge status: Home / Self Care | Payer: Medicare Other | Source: Ambulatory Visit | Attending: Family Medicine | Admitting: Family Medicine

## 2016-11-06 ENCOUNTER — Encounter: Payer: Self-pay | Admitting: Family Medicine

## 2016-11-06 ENCOUNTER — Ambulatory Visit (INDEPENDENT_AMBULATORY_CARE_PROVIDER_SITE_OTHER): Payer: Medicare Other | Admitting: Family Medicine

## 2016-11-06 VITALS — BP 104/54 | HR 63 | Temp 97.5°F | Wt 138.5 lb

## 2016-11-06 DIAGNOSIS — C649 Malignant neoplasm of unspecified kidney, except renal pelvis: Secondary | ICD-10-CM | POA: Diagnosis not present

## 2016-11-06 DIAGNOSIS — J90 Pleural effusion, not elsewhere classified: Secondary | ICD-10-CM | POA: Diagnosis not present

## 2016-11-06 DIAGNOSIS — R609 Edema, unspecified: Secondary | ICD-10-CM

## 2016-11-06 LAB — CBC WITH DIFFERENTIAL/PLATELET
BASOS PCT: 0.1 % (ref 0.0–3.0)
Basophils Absolute: 0 10*3/uL (ref 0.0–0.1)
EOS ABS: 0.1 10*3/uL (ref 0.0–0.7)
EOS PCT: 1 % (ref 0.0–5.0)
HEMATOCRIT: 37.1 % — AB (ref 39.0–52.0)
HEMOGLOBIN: 12.4 g/dL — AB (ref 13.0–17.0)
LYMPHS PCT: 31.1 % (ref 12.0–46.0)
Lymphs Abs: 2.3 10*3/uL (ref 0.7–4.0)
MCHC: 33.4 g/dL (ref 30.0–36.0)
MCV: 100.6 fl — ABNORMAL HIGH (ref 78.0–100.0)
MONO ABS: 0.5 10*3/uL (ref 0.1–1.0)
Monocytes Relative: 6.5 % (ref 3.0–12.0)
Neutro Abs: 4.6 10*3/uL (ref 1.4–7.7)
Neutrophils Relative %: 61.3 % (ref 43.0–77.0)
Platelets: 138 10*3/uL — ABNORMAL LOW (ref 150.0–400.0)
RBC: 3.69 Mil/uL — AB (ref 4.22–5.81)
RDW: 15.2 % (ref 11.5–15.5)
WBC: 7.4 10*3/uL (ref 4.0–10.5)

## 2016-11-06 LAB — BASIC METABOLIC PANEL
BUN: 22 mg/dL (ref 6–23)
CHLORIDE: 104 meq/L (ref 96–112)
CO2: 26 mEq/L (ref 19–32)
CREATININE: 1.12 mg/dL (ref 0.40–1.50)
Calcium: 7.5 mg/dL — ABNORMAL LOW (ref 8.4–10.5)
GFR: 67.16 mL/min (ref >60.00)
Glucose, Bld: 84 mg/dL (ref 70–99)
POTASSIUM: 3.7 meq/L (ref 3.5–5.1)
Sodium: 139 mEq/L (ref 135–145)

## 2016-11-06 NOTE — Patient Instructions (Signed)
Go to the lab on the way out.  We'll contact you with your lab and xray report. Use the dressing as needed on the left leg and keep your legs elevated.  If the swelling isn't getting better the let me know.  Take care.  Glad to see you.

## 2016-11-06 NOTE — Progress Notes (Signed)
Pre visit review using our clinic review tool, if applicable. No additional management support is needed unless otherwise documented below in the visit note. 

## 2016-11-06 NOTE — Progress Notes (Signed)
Admit date: 10/30/2016 Discharge date: 10/31/2016  Admitted From: Home Disposition:  Home  Recommendations for Outpatient Follow-up:  1. Follow up with PCP Dr. Damita Dunnings in 1 week 2. Follow up with Hematology/Oncology Dr. Alen Blew 3. Follow up with Urology as an outpatient 4. Continue Antibiotics Prescribed to you prior to Admission 5. Please obtain BMP/CBC within 1 week to check Potassium 6. Repeat a CXR as an outpatient.  7. 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 The patient has a known history of metastatic cancer as noted above, and he also knows that he has chronic bilateral pleural effusions that have been monitored intermittently with imaging. He saw his PCP yesterday for evaluation of 2-3 days of progressive DOE. The patient denies shortness of breath at rest. He denies chest pain, but says that he has had a pressure that feels like a weight on his chest. He has had a cough productive of green sputum, but he has not had fever. He denies sick contacts. He has had his flu shot this year. No light-headedness. He has only taken OTC mucinex for his symptoms. He was prescribed levaquin and tessalon perles yesterday, which he has filled but not yet started, because he was advised to report to the Ed. He was found to have LARGE left sided pleural effusion with mediastinal shift to the right on Outpatient Xray. Patient underwent a Thoracentesis by IR which drained 1.6 L. Fluid Analysis will needed to be followed up on. Patient  was medically stable for D/C and was D/C'd home and told to follow up with PCP and primary Hematologist/Oncologist Dr. Alen Blew.  Discharge Diagnoses:  Principal Problem:   Pleural effusion Active Problems:   Hypothyroidism   Essential hypertension   Colon cancer (HCC)   Renal cell cancer (HCC)   Thrombocytopenia (HCC)   Edema  Enlarged pleural effusion, likely malignant, causing mediastinal shift and cough, chest tightness, and DOE s/p Thoracentesis  -Has had abnormal sputum production though he has not been febrile. Because he is an immunocompromised host, I believe continuation of empiric antibiotics is reasonable.  -Following studies for the pleural fluid need to be followed up on: cell count with diff, culture, LDH, total protein, and cytology.  -C/w Incentive spirometry -Anti-tussive as needed -Empiric levaquin '500mg'$  po daily -Oncology and/or PCP will need to follow-up pleural fluid studies -Ambulated well without need for Home O2; Breathing Significantly Improved.  -Follow CXR read stated Pleural Effusion looked worse however clinically patient seemed improved and sounded good. Possible Atelectasis. Repeat CXR as an outpatient.  HTN - C/w Verapamil, propranolol, HCTZ  Hypothyroidism -C/w Levothyroxine 52mg daily  Hypokalemia -Replacement ordered,  -Will need BMP as an outpatient  BPH -Indwelling Foley -C/w Finasteride, cardura -Follow up with Urology  Metastatic renal cancer -C/w Cabometyx '20mg'$  daily -Follow up with Dr. SAlen Blew Chronic LE edema -Consider echo as outpatient.   ---------------------------------------------------------------   Hospital follow-up. The above was discussed with patient. He had a known pleural effusion bilaterally. The left-sided effusion had increased significantly. He had a previously abnormal chest x-ray here at the clinic.  He was directed to go to the emergency room. He was admitted and had thoracentesis. Greater than 1 L  of fluid was drained off. He felt better. His chest x-ray did not show marketed improvement, but he felt well enough to where he was discharged home. His cytology from the pleural fluid has been negative. There was no malignant cells identified. Culture of pleural fluid was negative. He was continued on antibiotics in the meantime. He has much less shortness of breath now. His breathing is significantly better. He still has some occasional sputum production but this is clearly better than before. He has been compliant with antibiotics in the meantime.  He has a history of hypertension and mildly low potassium, due for repeat labs. He is still on hydrochlorothiazide. He has known bilateral lower extremity edema at baseline. He has had some weeping from the swelling on the left leg recently. No skin ulceration. He has known metastatic cancer that is followed by the oncology clinic and I appreciate the help of all involved.  All of this was discussed with patient. We reviewed his old x-ray together at the office visit.  Repeat labs are pending.  PMH and SH reviewed  ROS: Per HPI unless specifically indicated in ROS section   Meds, vitals, and allergies reviewed.   GEN: nad, alert and oriented HEENT: mucous membranes moist NECK: supple w/o LA CV: rrr.  PULM: His breathing is obviously improved from the last office visit. no inc wob, he still has decreased breath sounds left base, but this is clearly improved from before. He is not wheezing. ABD: soft, +bs EXT: 2+ BLE edema, not weeping on the right leg, but he does have some irritated skin without ulceration on the left lower leg posteriorly there is weeping clear fluid. SKIN: no acute rash

## 2016-11-07 ENCOUNTER — Encounter: Payer: Self-pay | Admitting: Family Medicine

## 2016-11-07 NOTE — Assessment & Plan Note (Addendum)
He started on hydrochlorothiazide. I don't want to put him on Lasix and drop his pressure or his potassium more. Repeat labs are pending for today. In meantime elevate his legs. I put an absorbent dressing on the lower leg. He can change this as needed. He does not have ulceration. I think we will not have to put an Unna boot since he does not have ulceration. If he can elevate his legs over the weekend and  I think this will be enough intervention in the meantime. See notes on labs. Continue HCTZ for now.

## 2016-11-07 NOTE — Assessment & Plan Note (Signed)
Per urology and oncology. I appreciate the help of all involved.

## 2016-11-07 NOTE — Assessment & Plan Note (Signed)
Culture was negative. I reviewed the old x-ray and compared it to the x-ray from today. See notes on x-ray. See notes on labs. He is clearly improved. He is finishing his antibiotics today. If he has more shortness of breath or if his sputum production gets worse he will let me know. Okay for outpatient follow-up. We can consider echo in the future. I did not yet order it.  The decision about echo will likely depend on his clinical course from this point onward.

## 2016-11-09 DIAGNOSIS — D229 Melanocytic nevi, unspecified: Secondary | ICD-10-CM | POA: Diagnosis not present

## 2016-11-09 DIAGNOSIS — D1800 Hemangioma unspecified site: Secondary | ICD-10-CM | POA: Diagnosis not present

## 2016-11-09 DIAGNOSIS — L812 Freckles: Secondary | ICD-10-CM | POA: Diagnosis not present

## 2016-11-09 DIAGNOSIS — Z1283 Encounter for screening for malignant neoplasm of skin: Secondary | ICD-10-CM | POA: Diagnosis not present

## 2016-11-09 DIAGNOSIS — D692 Other nonthrombocytopenic purpura: Secondary | ICD-10-CM | POA: Diagnosis not present

## 2016-11-09 DIAGNOSIS — L578 Other skin changes due to chronic exposure to nonionizing radiation: Secondary | ICD-10-CM | POA: Diagnosis not present

## 2016-11-09 DIAGNOSIS — L570 Actinic keratosis: Secondary | ICD-10-CM | POA: Diagnosis not present

## 2016-11-09 DIAGNOSIS — L821 Other seborrheic keratosis: Secondary | ICD-10-CM | POA: Diagnosis not present

## 2016-11-18 DIAGNOSIS — R338 Other retention of urine: Secondary | ICD-10-CM | POA: Diagnosis not present

## 2016-11-20 DIAGNOSIS — R338 Other retention of urine: Secondary | ICD-10-CM | POA: Diagnosis not present

## 2016-11-20 DIAGNOSIS — C642 Malignant neoplasm of left kidney, except renal pelvis: Secondary | ICD-10-CM | POA: Diagnosis not present

## 2016-11-24 ENCOUNTER — Encounter (HOSPITAL_COMMUNITY): Payer: Self-pay

## 2016-11-24 ENCOUNTER — Encounter: Payer: Self-pay | Admitting: Oncology

## 2016-11-24 ENCOUNTER — Ambulatory Visit: Payer: Medicare Other | Admitting: Family Medicine

## 2016-11-24 ENCOUNTER — Ambulatory Visit (HOSPITAL_COMMUNITY)
Admission: RE | Admit: 2016-11-24 | Discharge: 2016-11-24 | Disposition: A | Discharge status: Home / Self Care | Payer: Medicare Other | Source: Ambulatory Visit | Attending: Oncology | Admitting: Oncology

## 2016-11-24 ENCOUNTER — Ambulatory Visit (INDEPENDENT_AMBULATORY_CARE_PROVIDER_SITE_OTHER): Payer: Medicare Other | Admitting: Family Medicine

## 2016-11-24 ENCOUNTER — Other Ambulatory Visit (HOSPITAL_BASED_OUTPATIENT_CLINIC_OR_DEPARTMENT_OTHER): Payer: Medicare Other

## 2016-11-24 ENCOUNTER — Telehealth: Payer: Self-pay | Admitting: *Deleted

## 2016-11-24 ENCOUNTER — Encounter: Payer: Self-pay | Admitting: Family Medicine

## 2016-11-24 VITALS — BP 110/80 | HR 72 | Wt 140.0 lb

## 2016-11-24 DIAGNOSIS — C189 Malignant neoplasm of colon, unspecified: Secondary | ICD-10-CM

## 2016-11-24 DIAGNOSIS — J910 Malignant pleural effusion: Secondary | ICD-10-CM | POA: Insufficient documentation

## 2016-11-24 DIAGNOSIS — I83019 Varicose veins of right lower extremity with ulcer of unspecified site: Secondary | ICD-10-CM | POA: Diagnosis not present

## 2016-11-24 DIAGNOSIS — L97919 Non-pressure chronic ulcer of unspecified part of right lower leg with unspecified severity: Secondary | ICD-10-CM

## 2016-11-24 DIAGNOSIS — C642 Malignant neoplasm of left kidney, except renal pelvis: Secondary | ICD-10-CM | POA: Diagnosis not present

## 2016-11-24 DIAGNOSIS — C649 Malignant neoplasm of unspecified kidney, except renal pelvis: Secondary | ICD-10-CM | POA: Diagnosis not present

## 2016-11-24 DIAGNOSIS — R609 Edema, unspecified: Secondary | ICD-10-CM | POA: Diagnosis not present

## 2016-11-24 DIAGNOSIS — L98491 Non-pressure chronic ulcer of skin of other sites limited to breakdown of skin: Secondary | ICD-10-CM | POA: Diagnosis not present

## 2016-11-24 DIAGNOSIS — C7951 Secondary malignant neoplasm of bone: Secondary | ICD-10-CM | POA: Insufficient documentation

## 2016-11-24 DIAGNOSIS — C7800 Secondary malignant neoplasm of unspecified lung: Secondary | ICD-10-CM | POA: Diagnosis not present

## 2016-11-24 LAB — COMPREHENSIVE METABOLIC PANEL
ALBUMIN: 2.3 g/dL — AB (ref 3.5–5.0)
ALK PHOS: 83 U/L (ref 40–150)
ALT: 21 U/L (ref 0–55)
ANION GAP: 9 meq/L (ref 3–11)
AST: 22 U/L (ref 5–34)
BILIRUBIN TOTAL: 0.81 mg/dL (ref 0.20–1.20)
BUN: 16.5 mg/dL (ref 7.0–26.0)
CALCIUM: 8.1 mg/dL — AB (ref 8.4–10.4)
CO2: 25 mEq/L (ref 22–29)
Chloride: 108 mEq/L (ref 98–109)
Creatinine: 0.9 mg/dL (ref 0.7–1.3)
EGFR: 83 mL/min/{1.73_m2} — AB (ref >90)
Glucose: 93 mg/dl (ref 70–140)
Potassium: 4.2 mEq/L (ref 3.5–5.1)
Sodium: 141 mEq/L (ref 136–145)
TOTAL PROTEIN: 6.1 g/dL — AB (ref 6.4–8.3)

## 2016-11-24 LAB — CBC WITH DIFFERENTIAL/PLATELET
BASO%: 0.4 % (ref 0.0–2.0)
Basophils Absolute: 0 10*3/uL (ref 0.0–0.1)
EOS ABS: 0.1 10*3/uL (ref 0.0–0.5)
EOS%: 1.9 % (ref 0.0–7.0)
HCT: 39.6 % (ref 38.4–49.9)
HEMOGLOBIN: 12.9 g/dL — AB (ref 13.0–17.1)
LYMPH%: 36.1 % (ref 14.0–49.0)
MCH: 33.1 pg (ref 27.2–33.4)
MCHC: 32.7 g/dL (ref 32.0–36.0)
MCV: 101.2 fL — AB (ref 79.3–98.0)
MONO#: 0.4 10*3/uL (ref 0.1–0.9)
MONO%: 6.6 % (ref 0.0–14.0)
NEUT%: 55 % (ref 39.0–75.0)
NEUTROS ABS: 3 10*3/uL (ref 1.5–6.5)
PLATELETS: 173 10*3/uL (ref 140–400)
RBC: 3.91 10*6/uL — ABNORMAL LOW (ref 4.20–5.82)
RDW: 15.1 % — AB (ref 11.0–14.6)
WBC: 5.4 10*3/uL (ref 4.0–10.3)
lymph#: 1.9 10*3/uL (ref 0.9–3.3)

## 2016-11-24 MED ORDER — SODIUM CHLORIDE 0.9 % IJ SOLN
INTRAMUSCULAR | Status: AC
  Filled 2016-11-24: qty 50

## 2016-11-24 MED ORDER — IOPAMIDOL (ISOVUE-300) INJECTION 61%
100.0000 mL | Freq: Once | INTRAVENOUS | Status: AC | PRN
  Administered 2016-11-24: 100 mL via INTRAVENOUS

## 2016-11-24 MED ORDER — FUROSEMIDE 20 MG PO TABS: 20.0000 mg | ORAL_TABLET | Freq: Every day | ORAL | 0 refills | Status: DC

## 2016-11-24 MED ORDER — IOPAMIDOL (ISOVUE-300) INJECTION 61%
INTRAVENOUS | Status: AC
  Filled 2016-11-24: qty 100

## 2016-11-24 NOTE — Progress Notes (Signed)
Pre visit review using our clinic review tool, if applicable. No additional management support is needed unless otherwise documented below in the visit note. 

## 2016-11-24 NOTE — Patient Instructions (Addendum)
Start low dose lasix 20 mg daily until appt with Dr. Clement Husbands at least. Stop HCTZ.  Follow wts at home.  Elevated legs as able. Stop at front desk on way out for referral to wound care center. Keep follow up with Dr. Alen Blew as planned 12/8.  Given Zofran for nausea in office today.

## 2016-11-24 NOTE — Progress Notes (Signed)
   Subjective:    Patient ID: Hunter Hardin., male    DOB: 07/30/1935, 81 y.o.   MRN: [id number]  HPI   81 year old male pt  of Dr. Josefine Class with history of colon cancer s/p right hemicolectomy,  Stage 4 renal cell ca with lung mets, HTN,  Chronic bilateral left pleural effusion malignant) presents with chronic swelling in legs, worsening.  Recent hospitalization in mid November for LARGE left sided pleural effusion with mediastinal shift to the right on Outpatient Xray. Patient underwent a Thoracentesis by IR which drained 1.6 L.  Seen in hospital follow up on 11/17  Completed antibitoics.  Pleural culture negative  ( At that OV had 2+ BLE, ulceration on left lower leg weeping clear fluid.)  At that visit PCP started him on HCTZ, rec elevation of legs.  Placed an absorbant dressing on weeping leg.  In past PCP has felt diuretics would drop his BP precipitously ( at that OV BP was 104/54).. So has not started lasix. BP Readings from Last 3 Encounters:  11/24/16 110/80  11/06/16 (!) 104/54  10/31/16 124/71   Since then: he has gained 2 lbs.  HCTZ is not helping diurese him and he has fluids weeping from both legs. Swelling in last few days is significantly worse. Legs are painful and now with ulcers. Weeping a lot. SOB is stable, no new chest pain.   His son has contacted Dr. Alen Blew his oncologist about [facility] given he is having difficulty managing at home.  The son lives out of town.  Wt Readings from Last 3 Encounters:  11/24/16 140 lb (63.5 kg)  11/06/16 138 lb 8 oz (62.8 kg)  10/31/16 137 lb 9.1 oz (62.4 kg)   He had CT chest and abd/pelvis for eval of met cancer this AM.  In office he threw up the contrast from the CT scan.   Review of Systems  Constitutional: Positive for fatigue. Negative for fever.  HENT: Negative for ear pain.   Eyes: Negative for pain.  Respiratory: Positive for shortness of breath.   Cardiovascular: Positive for leg swelling.         Objective:   Physical Exam  Constitutional: Vital signs are normal. He appears well-developed and well-nourished.  Elderly male in wheelchair  HENT:  Head: Normocephalic.  Right Ear: Hearing normal.  Left Ear: Hearing normal.  Nose: Nose normal.  Mouth/Throat: Oropharynx is clear and moist and mucous membranes are normal.  Neck: Trachea normal. Carotid bruit is not present. No thyroid mass and no thyromegaly present.  Cardiovascular: Normal rate, regular rhythm and normal pulses.  Exam reveals no gallop, no distant heart sounds and no friction rub.   No murmur heard. No peripheral edema  Pulmonary/Chest: Effort normal and breath sounds normal. No respiratory distress.  Skin: Skin is warm, dry and intact. No rash noted.  Severe edema bilateral ankle with weeping ulcers bilaterally, right larger than left  Psychiatric: He has a normal mood and affect. His speech is normal and behavior is normal. Thought content normal.          Assessment & Plan:

## 2016-11-24 NOTE — Telephone Encounter (Signed)
Attempted to reply to my chart question, computer did not recognize donna austin. Spoke with patient's sister Tamala Fothergill, she gave me donna's phone number, [phone]. Voice mail is full and would not leave me a message. letha will try to get in touch with her and give her my number here at work.

## 2016-11-25 ENCOUNTER — Other Ambulatory Visit: Payer: Medicare Other

## 2016-11-25 ENCOUNTER — Telehealth: Payer: Self-pay | Admitting: *Deleted

## 2016-11-25 NOTE — Telephone Encounter (Signed)
Spoke with patient's daughter, Boone Master, proxy. [phone]  She is concerned that her father lives alone, his legs are swelling and weeping. needs home health evaluation?  CT scans done yesterday. Sees Dr. Alen Blew 11/27/16. He will address these issues. Will contact donna after visit to up date her on these issues.

## 2016-11-26 ENCOUNTER — Encounter: Payer: Medicare Other | Attending: Surgery | Admitting: Surgery

## 2016-11-26 DIAGNOSIS — L97819 Non-pressure chronic ulcer of other part of right lower leg with unspecified severity: Secondary | ICD-10-CM | POA: Diagnosis not present

## 2016-11-26 DIAGNOSIS — L139 Bullous disorder, unspecified: Secondary | ICD-10-CM | POA: Diagnosis not present

## 2016-11-26 DIAGNOSIS — I890 Lymphedema, not elsewhere classified: Secondary | ICD-10-CM | POA: Diagnosis not present

## 2016-11-26 DIAGNOSIS — L97311 Non-pressure chronic ulcer of right ankle limited to breakdown of skin: Secondary | ICD-10-CM | POA: Diagnosis not present

## 2016-11-26 DIAGNOSIS — I10 Essential (primary) hypertension: Secondary | ICD-10-CM | POA: Insufficient documentation

## 2016-11-26 DIAGNOSIS — Z85038 Personal history of other malignant neoplasm of large intestine: Secondary | ICD-10-CM | POA: Insufficient documentation

## 2016-11-26 DIAGNOSIS — Z85830 Personal history of malignant neoplasm of bone: Secondary | ICD-10-CM | POA: Insufficient documentation

## 2016-11-26 DIAGNOSIS — J90 Pleural effusion, not elsewhere classified: Secondary | ICD-10-CM | POA: Diagnosis not present

## 2016-11-26 DIAGNOSIS — Z87891 Personal history of nicotine dependence: Secondary | ICD-10-CM | POA: Diagnosis not present

## 2016-11-26 DIAGNOSIS — L97321 Non-pressure chronic ulcer of left ankle limited to breakdown of skin: Secondary | ICD-10-CM | POA: Diagnosis not present

## 2016-11-26 DIAGNOSIS — L97211 Non-pressure chronic ulcer of right calf limited to breakdown of skin: Secondary | ICD-10-CM | POA: Diagnosis not present

## 2016-11-26 DIAGNOSIS — E440 Moderate protein-calorie malnutrition: Secondary | ICD-10-CM | POA: Diagnosis not present

## 2016-11-26 DIAGNOSIS — Z85528 Personal history of other malignant neoplasm of kidney: Secondary | ICD-10-CM | POA: Diagnosis not present

## 2016-11-26 DIAGNOSIS — S81802A Unspecified open wound, left lower leg, initial encounter: Secondary | ICD-10-CM | POA: Diagnosis not present

## 2016-11-26 NOTE — Progress Notes (Addendum)
Hunter Hardin ([id number]) Visit Report for 11/26/2016 Chief Complaint Document Details Patient Name: Hunter Hardin, Hunter Hardin. Date of Service: 11/26/2016 10:00 AM Medical Record Number: [id number] Patient Account Number: [id number] Date of Birth/Sex: 07/04/1935 (81 y.o. Male) Treating RN: Montey Hora Primary Care Physician: Elsie Stain Other Clinician: Referring Physician: Eliezer Lofts Treating Physician/Extender: Frann Rider in Treatment: 0 Information Obtained from: Patient Chief Complaint Patient presents to the wound care center for a consult due non healing wound to both lower extremities which she's had for about 3 weeks now Electronic Signature(s) Signed: 11/26/2016 11:39:38 AM By: Christin Fudge MD, FACS Entered By: Christin Fudge on 11/26/2016 11:39:38 Hunter Hardin ([id number]) -------------------------------------------------------------------------------- HPI Details Patient Name: Hunter Hardin Date of Service: 11/26/2016 10:00 AM Medical Record Number: [id number] Patient Account Number: [id number] Date of Birth/Sex: 1935/08/30 (81 y.o. Male) Treating RN: Montey Hora Primary Care Physician: Elsie Stain Other Clinician: Referring Physician: Eliezer Lofts Treating Physician/Extender: Frann Rider in Treatment: 0 History of Present Illness Location: both lower extremities right worse than left Quality: Patient reports experiencing a dull pain to affected area(s). Severity: Patient states wound are getting worse. Duration: Patient has had the wound for < 3 weeks prior to presenting for treatment Timing: Pain in wound is Intermittent (comes and goes Context: The wound would happen gradually Modifying Factors: Other treatment(s) tried include:local ointments and local care Associated Signs and Symptoms: Patient reports having increase swelling. 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 Past medical history significant for essential hypertension, pleural effusion, colon cancer, hypothyroidism, renal cell cancer, hyperglycemia, thrombocytopenia, protein calorie malnutrition. the patient lives alone and has very little support and has a family friend who is helping him with his appointments. He has not seen a dermatologist for this condition recently. Electronic Signature(s) Signed: 11/26/2016 11:40:48 AM By: Christin Fudge MD, FACS Previous Signature: 11/26/2016 11:05:04 AM Version By: Christin Fudge MD, FACS Previous Signature: 11/26/2016 9:51:09 AM Version By: Christin Fudge MD, FACS Entered By: Christin Fudge on 11/26/2016 11:40:47 Hunter Hardin ([id number]) -------------------------------------------------------------------------------- Physical Exam Details Patient Name: Hunter Hardin, Hunter Hardin. Date of Service: 11/26/2016 10:00 AM Medical Record Number: [id number] Patient Account Number: [id number] Date of Birth/Sex: [birth year] (81 y.o. Male) Treating RN: Montey Hora Primary Care Physician: Elsie Stain Other Clinician: Referring Physician: Eliezer Lofts Treating Physician/Extender: Frann Rider in Treatment: 0 Constitutional . Pulse regular. Respirations normal and unlabored. Afebrile. . Eyes Nonicteric. Reactive to light. Ears, Nose, Mouth, and Throat Lips, teeth, and gums WNL.[name] Moist mucosa without lesions. Neck supple and nontender. No palpable supraclavicular or cervical adenopathy. Normal sized without goiter. Respiratory WNL. No retractions.. Cardiovascular Pedal Pulses WNL. ABI on the left is 1.06 and the right is 1.13. he has stage I lymphedema bilaterally. Chest Breasts symmetical and no nipple discharge.. Breast tissue WNL, no masses, lumps, or  tenderness.. Gastrointestinal (GI) Abdomen without masses or tenderness.. No liver or spleen enlargement or tenderness.. Lymphatic No adneopathy. No adenopathy. No adenopathy. Musculoskeletal Adexa without tenderness or enlargement.. Digits and nails w/o clubbing, cyanosis, infection, petechiae, ischemia, or inflammatory conditions.. Integumentary (Hair, Skin) No suspicious lesions. No crepitus or fluctuance. No peri-wound warmth or erythema. No masses.[name] Psychiatric Judgement and insight Intact.. No evidence of depression, anxiety, or agitation.. Notes the patient has lymphedema both lower extremities with  a fairly large ulceration on the right lateral lower third of his leg which is possibly arising from bullous disease. 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 No Sharp debridement was required today. Electronic Signature(s) Signed: 11/26/2016 11:42:25 AM By: Christin Fudge MD, FACS Entered By: Christin Fudge on 11/26/2016 11:42:25 Hunter Hardin, Hunter Hardin ([id number]) Hunter Hardin, Hunter Hardin ([id number]) -------------------------------------------------------------------------------- Physician Orders Details Patient Name: Hunter Hardin, Hunter Hardin. Date of Service: 11/26/2016 10:00 AM Medical Record Number: [id number] Patient Account Number: [id number] Date of Birth/Sex: Sep 03, 1935 (81 y.o. Male) Treating RN: Montey Hora Primary Care Physician: Elsie Stain Other Clinician: Referring Physician: Eliezer Lofts Treating Physician/Extender: Frann Rider in Treatment: 0 Verbal / Phone Orders: Yes Clinician: Montey Hora Read Back and Verified: Yes Diagnosis Coding Wound Cleansing Wound #1 Left,Lateral Lower Leg o Clean wound with Normal Saline. o Cleanse wound with mild soap and water Wound #2 Right,Medial Malleolus o Clean wound with Normal Saline. o Cleanse wound with mild soap and water Wound #3 Left,Lateral  Malleolus o Clean wound with Normal Saline. o Cleanse wound with mild soap and water Anesthetic Wound #1 Left,Lateral Lower Leg o Topical Lidocaine 4% cream applied to wound bed prior to debridement Wound #2 Right,Medial Malleolus o Topical Lidocaine 4% cream applied to wound bed prior to debridement Wound #3 Left,Lateral Malleolus o Topical Lidocaine 4% cream applied to wound bed prior to debridement Primary Wound Dressing Wound #1 Left,Lateral Lower Leg o Mepitel One Contact layer - or adaptic o Foam - non adherent foam Wound #2 Right,Medial Malleolus o Mepitel One Contact layer - or adaptic o Foam - non adherent foam Wound #3 Left,Lateral Malleolus o Mepitel One Contact layer - or adaptic o Foam - non adherent foam Secondary Dressing KI, [place] ([id number]) Wound #1 Left,Lateral Lower Leg o ABD pad - if needed for drainage Wound #2 Right,Medial Malleolus o ABD pad - if needed for drainage Wound #3 Left,Lateral Malleolus o ABD pad - if needed for drainage Dressing Change Frequency Wound #1 Left,Lateral Lower Leg o Change Dressing Monday, Wednesday, Friday Wound #2 Right,Medial Malleolus o Change Dressing Monday, Wednesday, Friday Wound #3 Left,Lateral Malleolus o Change Dressing Monday, Wednesday, Friday Follow-up Appointments Wound #1 Left,Lateral Lower Leg o Return Appointment in 1 week. Wound #2 Right,Medial Malleolus o Return Appointment in 1 week. Wound #3 Left,Lateral Malleolus o Return Appointment in 1 week. Edema Control Wound #1 Left,Lateral Lower Leg o Kerlix and Coban - Bilateral - lightly Wound #2 Right,Medial Malleolus o Kerlix and Coban - Bilateral - lightly Wound #3 Left,Lateral Malleolus o Kerlix and Coban - Bilateral - lightly Additional Orders / Instructions Wound #1 Left,Lateral Lower Leg o Increase protein intake. Wound #2 Right,Medial Malleolus o Increase protein intake. Wound #3  Left,Lateral Malleolus o Increase protein intake. Hunter Hardin, Hunter Hardin ([id number]) Home Health Wound #1 [facility] - Evaluate for any therapy needs patient has and get that order from patient's PCP Dr Elsie Stain o Home Health Nurse may visit PRN to address patientos wound care needs. o FACE TO FACE ENCOUNTER: MEDICARE and MEDICAID PATIENTS: I certify that this patient is under my care and that I had a face-to-face encounter that meets the physician face-to-face encounter requirements with this patient on this date. The encounter with the patient was in whole or in part for the following MEDICAL CONDITION: (primary reason for [place]) MEDICAL NECESSITY: I certify, that based on my findings, NURSING  services are a medically necessary home health service. HOME BOUND STATUS: I certify that my clinical findings support that this patient is homebound (i.e., Due to illness or injury, pt requires aid of supportive devices such as crutches, cane, wheelchairs, walkers, the use of special transportation or the assistance of another person to leave their place of residence. There is a normal inability to leave the home and doing so requires considerable and taxing effort. Other absences are for medical reasons / religious services and are infrequent or of short duration when for other reasons). o If current dressing causes regression in wound condition, may D/C ordered dressing product/s and apply Normal Saline Moist Dressing daily until next [place] / Other MD appointment. [place] of regression in wound condition at [phone]. o Please direct any NON-WOUND related issues/requests for orders to patient's Primary Care Physician Wound #2 [facility] - Evaluate for any therapy needs patient has and get that order from patient's PCP Dr  Elsie Stain o Home Health Nurse may visit PRN to address patientos wound care needs. o FACE TO FACE ENCOUNTER: MEDICARE and MEDICAID PATIENTS: I certify that this patient is under my care and that I had a face-to-face encounter that meets the physician face-to-face encounter requirements with this patient on this date. The encounter with the patient was in whole or in part for the following MEDICAL CONDITION: (primary reason for [place]) MEDICAL NECESSITY: I certify, that based on my findings, NURSING services are a medically necessary home health service. HOME BOUND STATUS: I certify that my clinical findings support that this patient is homebound (i.e., Due to illness or injury, pt requires aid of supportive devices such as crutches, cane, wheelchairs, walkers, the use of special transportation or the assistance of another person to leave their place of residence. There is a normal inability to leave the home and doing so requires considerable and taxing effort. Other absences are for medical reasons / religious services and are infrequent or of short duration when for other reasons). o If current dressing causes regression in wound condition, may D/C ordered dressing product/s and apply Normal Saline Moist Dressing daily until next [place] / Other MD appointment. [place] of regression in wound condition at [phone]. o Please direct any NON-WOUND related issues/requests for orders to patient's Primary Care Physician Wound #3 [facility] - Evaluate for any therapy needs patient has and get that order from patient's PCP Dr Elsie Stain o Home Health Nurse may visit PRN to address patientos wound care needs. Hunter Hardin, Hunter Hardin ([id number]) o FACE TO FACE ENCOUNTER: MEDICARE and MEDICAID PATIENTS: I certify that this patient is under my care and that I had a face-to-face encounter  that meets the physician face-to-face encounter requirements with this patient on this date. The encounter with the patient was in whole or in part for the following MEDICAL CONDITION: (primary reason for [place]) MEDICAL NECESSITY: I certify, that based on my findings, NURSING services are a medically necessary home health service. HOME BOUND STATUS: I certify that my clinical findings support that this patient is homebound (i.e., Due to illness or injury, pt requires aid of supportive devices such as crutches, cane, wheelchairs, walkers, the use of special transportation or the assistance of another person to leave their place of residence. There is a normal inability to leave the home and doing so requires considerable  and taxing effort. Other absences are for medical reasons / religious services and are infrequent or of short duration when for other reasons). o If current dressing causes regression in wound condition, may D/C ordered dressing product/s and apply Normal Saline Moist Dressing daily until next [place] / Other MD appointment. [place] of regression in wound condition at [phone]. o Please direct any NON-WOUND related issues/requests for orders to patient's Primary Care Physician Electronic Signature(s) Signed: 11/26/2016 4:12:42 PM By: Christin Fudge MD, FACS Signed: 11/26/2016 5:03:06 PM By: Montey Hora Entered By: Montey Hora on 11/26/2016 11:33:43 Hunter Hardin ([id number]) -------------------------------------------------------------------------------- Problem List Details Patient Name: ELHADJI, PECORE. Date of Service: 11/26/2016 10:00 AM Medical Record Number: [id number] Patient Account Number: [id number] Date of Birth/Sex: 1935-03-20 (81 y.o. 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 Male) Treating RN: Montey Hora Primary Care Physician: Elsie Stain Other Clinician: Referring Physician: Eliezer Lofts Treating Physician/Extender: Frann Rider in Treatment: 0 Subjective Chief Complaint Information obtained from Patient Patient presents to the wound care center for a consult due non healing wound to both lower extremities which she's had for about 3 weeks now History of Present Illness (HPI) The following HPI elements were documented for the patient's wound: Location: both lower extremities right worse than left Quality: Patient reports experiencing a dull pain to affected area(s). Severity: Patient states wound are getting worse. Duration: Patient has had the wound for < 3 weeks prior to presenting for treatment Timing: Pain in wound is Intermittent (comes and goes Context: The wound  would happen gradually Modifying Factors: Other treatment(s) tried include:local ointments and local care Associated Signs and Symptoms: Patient reports having increase swelling. 81 year old patient with a history of colon cancer status post right hemicolectomy and stage IV renal cell carcinoma with metastasis to the lung and bone also has hypertension, chronic bilateral pleural effusion and chronic swelling in his legs which are now getting worse. Past medical history significant for essential hypertension, pleural effusion, colon cancer, hypothyroidism, renal cell cancer, hyperglycemia, thrombocytopenia, protein calorie malnutrition. the patient lives alone and has very little support and has a family friend who is helping him with his appointments. He has not seen a dermatologist for this condition recently. Wound History Patient presents with 2 open wounds that have been present for approximately 2 weeks. Patient has been treating wounds in the following manner: ace wraps and cream. Laboratory tests have been performed in the last month. Patient reportedly has not tested positive for an antibiotic resistant organism. Patient reportedly has not tested positive for osteomyelitis. Patient reportedly has not had testing performed to  evaluate circulation in the legs. Patient experiences the following problems associated with their wounds: swelling. Patient History Information obtained from Patient. Allergies No Known Allergies CAEDON, BOND ([id number]) Family History Cancer - Mother, No family history of Diabetes, Heart Disease, Hypertension, Kidney Disease, Lung Disease, Seizures, Stroke, Thyroid Problems, Tuberculosis. Social History Former smoker, Marital Status - Divorced, Alcohol Use - Never, Drug Use - No History, Caffeine Use - Daily. Medical History Eyes Denies history of Cataracts, Glaucoma, Optic Neuritis Ear/Nose/Mouth/Throat Denies history of Chronic sinus  problems/congestion, Middle ear problems Respiratory Denies history of Aspiration, Asthma, Chronic Obstructive Pulmonary Disease (COPD), Pneumothorax, Sleep Apnea, Tuberculosis Cardiovascular Denies history of Angina, Arrhythmia, Congestive Heart Failure, Coronary Artery Disease, Deep Vein Thrombosis, Hypertension, Myocardial Infarction, Peripheral Arterial Disease, Peripheral Venous Disease, Phlebitis, Vasculitis Gastrointestinal Denies history of Cirrhosis , Colitis, Crohn s, Hepatitis A, Hepatitis B, Hepatitis C Endocrine Denies history of Type I Diabetes, Type II Diabetes Genitourinary Denies history of End Stage Renal Disease Immunological Denies history of Lupus Erythematosus, Raynaud s, Scleroderma Integumentary (Skin) Denies history of History of Burn, History of pressure wounds Musculoskeletal Patient has history of Osteoarthritis - Hands Denies history of Gout, Rheumatoid Arthritis, Osteomyelitis Neurologic Denies history of Dementia, Neuropathy, Quadriplegia, Paraplegia, Seizure Disorder Oncologic Patient has history of Received Chemotherapy - Pills Denies history of Received Radiation Psychiatric Denies history of Anorexia/bulimia, Confinement Anxiety Hospitalization/Surgery History - 10/30/2016, [place], [place], fluid on lung. 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 Eyes The patient has no complaints or symptoms. Ear/Nose/Mouth/Throat The patient has no complaints or symptoms. Hematologic/Lymphatic The patient has no complaints or symptoms. Respiratory Complains or has symptoms of Shortness of Breath. Denies complaints or symptoms of Chronic or  frequent coughs. Cardiovascular Complains or has symptoms of LE edema. Gastrointestinal The patient has no complaints or symptoms. Endocrine The patient has no complaints or symptoms. Genitourinary The patient has no complaints or symptoms. Immunological The patient has no complaints or symptoms. Integumentary (Skin) The patient has no complaints or symptoms. Musculoskeletal The patient has no complaints or symptoms. Neurologic The patient has no complaints or symptoms. Oncologic The patient has no complaints or symptoms, Left Kidney and Left Lung Cancer Psychiatric The patient has no complaints or symptoms. Medications doxazosin 8 mg tablet oral tablet oral loperamide 2 mg tablet oral tablet oral ondansetron 8 mg disintegrating tablet oral tablet,disintegrating oral Cabometyx 20 mg tablet oral tablet oral benzonatate 200 mg capsule oral capsule oral finasteride 5 mg tablet oral tablet oral propranolol 40 mg tablet oral tablet oral verapamil ER (SR) 240 mg tablet,extended release oral tablet extended release oral Boost 0.04 gram-1 kcal/mL oral liquid oral liquid oral Omega-3 Fish Oil 300 mg-1,000 mg capsule oral capsule oral Hunter Hardin, Hunter Hardin ([id number]) furosemide 20 mg tablet oral tablet oral Klor-Con M20 mEq tablet,extended release oral tablet,ER particles/crystals oral hydrochlorothiazide 25 mg tablet oral tablet oral levothyroxine 50 mcg tablet oral tablet oral Vitamin C 500 mg tablet oral tablet oral Objective Constitutional Pulse regular. Respirations normal and unlabored. Afebrile. Vitals Time Taken: 10:10 AM, Height: 67 in, Source: Stated, Weight: 138 lbs, Source: Measured, BMI: 21.6, Temperature: 97.5 F, Pulse: 63 bpm, Respiratory Rate: 18 breaths/min, Blood Pressure: 105/84 mmHg. Eyes Nonicteric. Reactive to light. Ears, Nose, Mouth, and Throat Lips, teeth, and gums WNL.[name]  Moist mucosa without lesions. Neck supple and nontender. No palpable supraclavicular  or cervical adenopathy. Normal sized without goiter. Respiratory WNL. No retractions.. Cardiovascular Pedal Pulses WNL. ABI on the left is 1.06 and the right is 1.13. he has stage I lymphedema bilaterally. Chest Breasts symmetical and no nipple discharge.. Breast tissue WNL, no masses, lumps, or tenderness.. Gastrointestinal (GI) Abdomen without masses or tenderness.. No liver or spleen enlargement or tenderness.. Lymphatic No adneopathy. No adenopathy. No adenopathy. Musculoskeletal Adexa without tenderness or enlargement.. Digits and nails w/o clubbing, cyanosis, infection, petechiae, ischemia, or inflammatory conditions.[name] Psychiatric Judgement and insight Intact.. No evidence of depression, anxiety, or agitation.Hunter Hardin, Hunter Hardin ([id number]) General Notes: the patient has lymphedema both lower extremities with a fairly large ulceration on the right lateral lower third of his leg which is possibly arising from bullous disease. 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 No Sharp debridement was required today. Integumentary (Hair, Skin) No suspicious lesions. No crepitus or fluctuance. No peri-wound warmth or erythema. No masses.. Wound #1 status is Open. Original cause of wound was Gradually Appeared. The wound is located on the Left,Lateral Lower Leg. The wound measures 19.5cm length x 9.5cm width x 0.1cm depth; 145.495cm^2 area and 14.55cm^3 volume. The wound is limited to skin breakdown. There is no tunneling or undermining noted. There is a large amount of serous drainage noted. The wound margin is flat and intact. There is small (1-33%) red granulation within the wound bed. There is a small (1-33%) amount of necrotic tissue within the wound bed including Adherent Slough. The periwound skin appearance exhibited: Maceration, Moist, Erythema. The periwound skin appearance did not exhibit: Callus, Crepitus,  Excoriation, Fluctuance, Friable, Induration, Localized Edema, Rash, Scarring, Dry/Scaly, Atrophie Blanche, Cyanosis, Ecchymosis, Hemosiderin Staining, Mottled, Pallor, Rubor. The surrounding wound skin color is noted with erythema which is circumferential. Periwound temperature was noted as No Abnormality. The periwound has tenderness on palpation. Wound #2 status is Open. Original cause of wound was Gradually Appeared. The wound is located on the Right,Medial Malleolus. The wound measures 1.7cm length x 3.4cm width x 0.1cm depth; 4.54cm^2 area and 0.454cm^3 volume. The wound is limited to skin breakdown. There is no tunneling or undermining noted. There is a large amount of serous drainage noted. The wound margin is flat and intact. There is medium (34-66%) red granulation within the wound bed. There is a medium (34-66%) amount of necrotic tissue within the wound bed including Adherent Slough. The periwound skin appearance exhibited: Moist, Erythema. The periwound skin appearance did not exhibit: Callus, Crepitus, Excoriation, Fluctuance, Friable, Induration, Localized Edema, Rash, Scarring, Dry/Scaly, Maceration, Atrophie Blanche, Cyanosis, Ecchymosis, Hemosiderin Staining, Mottled, Pallor, Rubor. The surrounding wound skin color is noted with erythema which is circumferential. Periwound temperature was noted as No Abnormality. The periwound has tenderness on palpation. Wound #3 status is Open. Original cause of wound was Gradually Appeared. The wound is located on the Left,Lateral Malleolus. The wound measures 1.6cm length x 2.1cm width x 0.1cm depth; 2.639cm^2 area and 0.264cm^3 volume. The wound is limited to skin breakdown. There is no tunneling or undermining noted. There is a large amount of serous drainage noted. The wound margin is flat and intact. There is medium (34-66%) red granulation within the wound bed. There is a medium (34-66%) amount of necrotic tissue within the wound bed  including Adherent Slough. The periwound skin appearance exhibited: Moist, Erythema. The periwound skin appearance did not exhibit: Callus, Crepitus, Excoriation,  Fluctuance, Friable, Induration, Localized Edema, Rash, Scarring, Dry/Scaly, Maceration, Atrophie Blanche, Cyanosis, Ecchymosis, Hemosiderin Staining, Mottled, Pallor, Rubor. The surrounding wound skin color is noted with erythema which is circumferential. Periwound temperature was noted as No Abnormality. The periwound has tenderness on palpation. Assessment Hunter Hardin, HOWERTER ([id number]) Active Problems ICD-10 I89.0 - Lymphedema, not elsewhere classified E44.0 - Moderate protein-calorie malnutrition L13.9 - Bullous disorder, unspecified Z85.528 - Personal history of other malignant neoplasm of kidney L97.211 - Non-pressure chronic ulcer of right calf limited to breakdown of skin L97.321 - Non-pressure chronic ulcer of left ankle limited to breakdown of skin this elderly gentleman who is rather debilitated and has protein calorie malnutrition has lymphedema and significant excoriation of the bilateral lower extremities which may be a manifestation of bullous dermatitis. He has advanced stage IV renal cell carcinoma and has massive pleural effusion and lung metastasis. At this stage his management is palliative and hence I have recommended: 1. A nonadherent layer with foam to be lightly wrapped with Kerlix and Coban and change 3 times a day if there is significant drainage. 2. Elevation and exercise have been discussed with him 3. dermatology opinion when available 4. Increase his protein intake, vitamin A, vitamin C and zinc 5. Weekly visits to the wound center for evaluation He and his caregiver do understand that our treatment is going to be palliative to help him alleviate some of his symptoms Plan Wound Cleansing: Wound #1 Left,Lateral Lower Leg: Clean wound with Normal Saline. Cleanse wound with mild soap and  water Wound #2 Right,Medial Malleolus: Clean wound with Normal Saline. Cleanse wound with mild soap and water Wound #3 Left,Lateral Malleolus: Clean wound with Normal Saline. Cleanse wound with mild soap and water Anesthetic: Wound #1 Left,Lateral Lower Leg: Topical Lidocaine 4% cream applied to wound bed prior to debridement Wound #2 Right,Medial Malleolus: Topical Lidocaine 4% cream applied to wound bed prior to debridement Wound #3 Left,Lateral Malleolus: Hunter Hardin, Hunter Hardin ([id number]) Topical Lidocaine 4% cream applied to wound bed prior to debridement Primary Wound Dressing: Wound #1 Left,Lateral Lower Leg: Mepitel One Contact layer - or adaptic Foam - non adherent foam Wound #2 Right,Medial Malleolus: Mepitel One Contact layer - or adaptic Foam - non adherent foam Wound #3 Left,Lateral Malleolus: Mepitel One Contact layer - or adaptic Foam - non adherent foam Secondary Dressing: Wound #1 Left,Lateral Lower Leg: ABD pad - if needed for drainage Wound #2 Right,Medial Malleolus: ABD pad - if needed for drainage Wound #3 Left,Lateral Malleolus: ABD pad - if needed for drainage Dressing Change Frequency: Wound #1 Left,Lateral Lower Leg: Change Dressing Monday, Wednesday, Friday Wound #2 Right,Medial Malleolus: Change Dressing Monday, Wednesday, Friday Wound #3 Left,Lateral Malleolus: Change Dressing Monday, Wednesday, Friday Follow-up Appointments: Wound #1 Left,Lateral Lower Leg: Return Appointment in 1 week. Wound #2 Right,Medial Malleolus: Return Appointment in 1 week. Wound #3 Left,Lateral Malleolus: Return Appointment in 1 week. Edema Control: Wound #1 Left,Lateral Lower Leg: Kerlix and Coban - Bilateral - lightly Wound #2 Right,Medial Malleolus: Kerlix and Coban - Bilateral - lightly Wound #3 Left,Lateral Malleolus: Kerlix and Coban - Bilateral - lightly Additional Orders / Instructions: Wound #1 Left,Lateral Lower Leg: Increase protein intake. Wound #2  Right,Medial Malleolus: Increase protein intake. Wound #3 Left,Lateral Malleolus: Increase protein intake. Home Health: Wound #1 Left,Lateral Lower Leg: [place] [facility] - Evaluate for any therapy needs patient has and get that order from patient's PCP Dr Renard Matter Nurse may visit PRN to address patient s wound  care needs. TRAYLON, SCHIMMING ([id number]) FACE TO FACE ENCOUNTER: MEDICARE and MEDICAID PATIENTS: I certify that this patient is under my care and that I had a face-to-face encounter that meets the physician face-to-face encounter requirements with this patient on this date. The encounter with the patient was in whole or in part for the following MEDICAL CONDITION: (primary reason for [place]) MEDICAL NECESSITY: I certify, that based on my findings, NURSING services are a medically necessary home health service. HOME BOUND STATUS: I certify that my clinical findings support that this patient is homebound (i.e., Due to illness or injury, pt requires aid of supportive devices such as crutches, cane, wheelchairs, walkers, the use of special transportation or the assistance of another person to leave their place of residence. There is a normal inability to leave the home and doing so requires considerable and taxing effort. Other absences are for medical reasons / religious services and are infrequent or of short duration when for other reasons). If current dressing causes regression in wound condition, may D/C ordered dressing product/s and apply Normal Saline Moist Dressing daily until next [place] / Other MD appointment. [place] of regression in wound condition at [phone]. Please direct any NON-WOUND related issues/requests for orders to patient's Primary Care Physician Wound #2 Right,Medial Malleolus: [place] [facility] - Evaluate for any therapy needs patient has and get that  order from patient's PCP Dr Phillip Heal [facility] Nurse may visit PRN to address patient s wound care needs. FACE TO FACE ENCOUNTER: MEDICARE and MEDICAID PATIENTS: I certify that this patient is under my care and that I had a face-to-face encounter that meets the physician face-to-face encounter requirements with this patient on this date. The encounter with the patient was in whole or in part for the following MEDICAL CONDITION: (primary reason for [place]) MEDICAL NECESSITY: I certify, that based on my findings, NURSING services are a medically necessary home health service. HOME BOUND STATUS: I certify that my clinical findings support that this patient is homebound (i.e., Due to illness or injury, pt requires aid of supportive devices such as crutches, cane, wheelchairs, walkers, the use of special transportation or the assistance of another person to leave their place of residence. There is a normal inability to leave the home and doing so requires considerable and taxing effort. Other absences are for medical reasons / religious services and are infrequent or of short duration when for other reasons). If current dressing causes regression in wound condition, may D/C ordered dressing product/s and apply Normal Saline Moist Dressing daily until next [place] / Other MD appointment. [place] of regression in wound condition at [phone]. Please direct any NON-WOUND related issues/requests for orders to patient's Primary Care Physician Wound #3 Left,Lateral Malleolus: [place] [facility] - Evaluate for any therapy needs patient has and get that order from patient's PCP Dr Phillip Heal [facility] Nurse may visit PRN to address patient s wound care needs. FACE TO FACE ENCOUNTER: MEDICARE and MEDICAID PATIENTS: I certify that this patient is under my care and that I had a face-to-face encounter that meets the physician  face-to-face encounter requirements with this patient on this date. The encounter with the patient was in whole or in part for the following MEDICAL CONDITION: (primary reason for [place]) MEDICAL NECESSITY: I certify, that based on my findings, NURSING services are a medically necessary home health service. HOME BOUND  STATUS: I certify that my clinical findings support that this patient is homebound (i.e., Due to illness or injury, pt requires aid of supportive devices such as crutches, cane, wheelchairs, walkers, the use of special transportation or the assistance of another person to leave their place of residence. There is a normal inability to leave the home and doing so requires considerable and taxing effort. Other absences are for medical reasons / religious services and are infrequent or of short duration when for other reasons). If current dressing causes regression in wound condition, may D/C ordered dressing product/s and apply Normal Saline Moist Dressing daily until next [place] / Other MD appointment. [place] of regression in wound condition at [phone]. Please direct any NON-WOUND related issues/requests for orders to patient's Primary Care Physician GLENNIE, RODDA ([id number]) this elderly gentleman who is rather debilitated and has protein calorie malnutrition has lymphedema and significant excoriation of the bilateral lower extremities which may be a manifestation of bullous dermatitis. He has advanced stage IV renal cell carcinoma and has massive pleural effusion and lung metastasis. At this stage his management is palliative and hence I have recommended: 1. A nonadherent layer with foam to be lightly wrapped with Kerlix and Coban and change 3 times a day if there is significant drainage. 2. Elevation and exercise have been discussed with him 3. dermatology opinion when available 4. Increase his protein intake, vitamin A,  vitamin C and zinc 5. Weekly visits to the wound center for evaluation He and his caregiver do understand that our treatment is going to be palliative to help him alleviate some of his symptoms Electronic Signature(s) Signed: 11/26/2016 11:46:10 AM By: Christin Fudge MD, FACS Previous Signature: 11/26/2016 11:44:48 AM Version By: Christin Fudge MD, FACS Entered By: Christin Fudge on 11/26/2016 11:46:10 Hunter Hardin ([id number]) -------------------------------------------------------------------------------- ROS/PFSH Details Patient Name: Hunter Hardin. Date of Service: 11/26/2016 10:00 AM Medical Record Number: [id number] Patient Account Number: [id number] Date of Birth/Sex: 1935-10-03 (81 y.o. Male) Treating RN: Cornell Barman Primary Care Physician: Elsie Stain Other Clinician: Referring Physician: Eliezer Lofts Treating Physician/Extender: Frann Rider in Treatment: 0 Information Obtained From Patient Wound History Do you currently have one or more open woundso Yes How many open wounds do you currently haveo 2 Approximately how long have you had your woundso 2 weeks How have you been treating your wound(s) until nowo ace wraps and cream Has your wound(s) ever healed and then re-openedo No Have you had any lab work done in the past montho Yes Who ordered the lab work [place] Have you tested positive for an antibiotic resistant organism (MRSA, VRE)o No Have you tested positive for osteomyelitis (bone infection)o No Have you had any tests for circulation on your legso No Have you had other problems associated with your woundso Swelling Constitutional Symptoms (General Health) Complaints and Symptoms: No Complaints or Symptoms Complaints and Symptoms: Negative for: Fatigue; Fever; Chills; Marked Weight Change Medical History: Past Medical History Notes: Kidney and lung Cancer; Hyperthryoidism, HTN ; BPH; Chronic LE Edema Respiratory Complaints and  Symptoms: Positive for: Shortness of Breath Negative for: Chronic or frequent coughs Medical History: Negative for: Aspiration; Asthma; Chronic Obstructive Pulmonary Disease (COPD); Pneumothorax; Sleep Apnea; Tuberculosis Past Medical History Notes: Lung Cancer Cardiovascular CALLAN, NORDEN ([id number]) Complaints and Symptoms: Positive for: LE edema Medical History: Negative for: Angina; Arrhythmia; Congestive Heart Failure; Coronary Artery Disease; Deep Vein Thrombosis; Hypertension; Myocardial Infarction; Peripheral Arterial Disease; Peripheral Venous Disease; Phlebitis; Vasculitis  Integumentary (Skin) Complaints and Symptoms: No Complaints or Symptoms Complaints and Symptoms: Negative for: Bleeding or bruising tendency; Breakdown; Swelling Medical History: Negative for: History of Burn; History of pressure wounds Eyes Complaints and Symptoms: No Complaints or Symptoms Medical History: Negative for: Cataracts; Glaucoma; Optic Neuritis Ear/Nose/Mouth/Throat Complaints and Symptoms: No Complaints or Symptoms Medical History: Negative for: Chronic sinus problems/congestion; Middle ear problems Hematologic/Lymphatic Complaints and Symptoms: No Complaints or Symptoms Gastrointestinal Complaints and Symptoms: No Complaints or Symptoms Medical History: Negative for: Cirrhosis ; Colitis; Crohnos; Hepatitis A; Hepatitis B; Hepatitis C Endocrine Complaints and Symptoms: No Complaints or Symptoms JAGGER, DEMONTE ([id number]) Medical History: Negative for: Type I Diabetes; Type II Diabetes Genitourinary Complaints and Symptoms: No Complaints or Symptoms Medical History: Negative for: End Stage Renal Disease Past Medical History Notes: Cancer in Left Kidney; catheter Immunological Complaints and Symptoms: No Complaints or Symptoms Medical History: Negative for: Lupus Erythematosus; Raynaudos; Scleroderma Musculoskeletal Complaints and Symptoms: No Complaints or  Symptoms Medical History: Positive for: Osteoarthritis - Hands Negative for: Gout; Rheumatoid Arthritis; Osteomyelitis Neurologic Complaints and Symptoms: No Complaints or Symptoms Medical History: Negative for: Dementia; Neuropathy; Quadriplegia; Paraplegia; Seizure Disorder Oncologic Complaints and Symptoms: No Complaints or Symptoms Complaints and Symptoms: Review of System Notes: Left Kidney and Left Lung Cancer Medical History: Positive for: Received Chemotherapy - Pills Negative for: Received Radiation GIOVONNI, POIRIER ([id number]) Psychiatric Complaints and Symptoms: No Complaints or Symptoms Medical History: Negative for: Anorexia/bulimia; Confinement Anxiety Immunizations Pneumococcal Vaccine: Received Pneumococcal Vaccination: No Hospitalization / Surgery History Name of Hospital Purpose of Hospitalization/Surgery Date [place] [place] fluid on lung 10/30/2016 Family and Social History Cancer: Yes - Mother; Diabetes: No; Heart Disease: No; Hypertension: No; Kidney Disease: No; Lung Disease: No; Seizures: No; Stroke: No; Thyroid Problems: No; Tuberculosis: No; Former smoker; Marital Status - Divorced; Alcohol Use: Never; Drug Use: No History; Caffeine Use: Daily; Advanced Directives: Yes - Cloretta Ned (Not Provided); Patient does not want information on Advanced Directives; Living Will: Yes (Not Provided) Physician Affirmation I have reviewed and agree with the above information. Electronic Signature(s) Signed: 11/26/2016 4:12:42 PM By: Christin Fudge MD, FACS Signed: 11/26/2016 5:13:44 PM By: Gretta Cool RN, BSN, Kim RN, BSN Entered By: Christin Fudge on 11/26/2016 11:04:10 Hunter Hardin ([id number]) -------------------------------------------------------------------------------- [place] Details Patient Name: ZAELYN, BARBARY. Date of Service: 11/26/2016 Medical Record Number: [id number] Patient Account Number: [id number] Date of Birth/Sex: [birth year] (81 y.o.  Male) Treating RN: Montey Hora Primary Care Physician: Elsie Stain Other Clinician: Referring Physician: Eliezer Lofts Treating Physician/Extender: Frann Rider in Treatment: 0 Diagnosis Coding ICD-10 Codes Code Description I89.0 Lymphedema, not elsewhere classified E44.0 Moderate protein-calorie malnutrition L13.9 Bullous disorder, unspecified Z85.528 Personal history of other malignant neoplasm of kidney Facility Procedures CPT4 Code: [id number] Description: [phone] - WOUND CARE VISIT-LEV 5 EST PT Modifier: Quantity: 1 Physician Procedures CPT4 Code: 2482500 Description: 37048 - WC PHYS LEVEL 4 - NEW PT ICD-10 Description Diagnosis I89.0 Lymphedema, not elsewhere classified E44.0 Moderate protein-calorie malnutrition L13.9 Bullous disorder, unspecified Z85.528 Personal history of other malignant neoplasm o Modifier: f kidney Quantity: 1 Electronic Signature(s) Signed: 11/26/2016 11:45:07 AM By: Christin Fudge MD, FACS Entered By: Christin Fudge on 11/26/2016 11:45:07

## 2016-11-27 ENCOUNTER — Telehealth: Payer: Self-pay | Admitting: Oncology

## 2016-11-27 ENCOUNTER — Ambulatory Visit (HOSPITAL_BASED_OUTPATIENT_CLINIC_OR_DEPARTMENT_OTHER): Payer: Medicare Other | Admitting: Oncology

## 2016-11-27 ENCOUNTER — Telehealth: Payer: Self-pay | Admitting: *Deleted

## 2016-11-27 VITALS — BP 144/80 | HR 68 | Resp 17 | Ht 67.0 in | Wt 136.9 lb

## 2016-11-27 DIAGNOSIS — Z85038 Personal history of other malignant neoplasm of large intestine: Secondary | ICD-10-CM | POA: Diagnosis not present

## 2016-11-27 DIAGNOSIS — J90 Pleural effusion, not elsewhere classified: Secondary | ICD-10-CM

## 2016-11-27 DIAGNOSIS — L97321 Non-pressure chronic ulcer of left ankle limited to breakdown of skin: Secondary | ICD-10-CM | POA: Diagnosis not present

## 2016-11-27 DIAGNOSIS — R634 Abnormal weight loss: Secondary | ICD-10-CM | POA: Diagnosis not present

## 2016-11-27 DIAGNOSIS — R609 Edema, unspecified: Secondary | ICD-10-CM | POA: Diagnosis not present

## 2016-11-27 DIAGNOSIS — Z85528 Personal history of other malignant neoplasm of kidney: Secondary | ICD-10-CM | POA: Diagnosis not present

## 2016-11-27 DIAGNOSIS — L97311 Non-pressure chronic ulcer of right ankle limited to breakdown of skin: Secondary | ICD-10-CM | POA: Diagnosis not present

## 2016-11-27 DIAGNOSIS — L139 Bullous disorder, unspecified: Secondary | ICD-10-CM | POA: Diagnosis not present

## 2016-11-27 DIAGNOSIS — I890 Lymphedema, not elsewhere classified: Secondary | ICD-10-CM | POA: Diagnosis not present

## 2016-11-27 DIAGNOSIS — L97211 Non-pressure chronic ulcer of right calf limited to breakdown of skin: Secondary | ICD-10-CM | POA: Diagnosis not present

## 2016-11-27 DIAGNOSIS — C7800 Secondary malignant neoplasm of unspecified lung: Secondary | ICD-10-CM | POA: Diagnosis not present

## 2016-11-27 DIAGNOSIS — E440 Moderate protein-calorie malnutrition: Secondary | ICD-10-CM | POA: Diagnosis not present

## 2016-11-27 DIAGNOSIS — N400 Enlarged prostate without lower urinary tract symptoms: Secondary | ICD-10-CM | POA: Diagnosis not present

## 2016-11-27 DIAGNOSIS — I10 Essential (primary) hypertension: Secondary | ICD-10-CM | POA: Diagnosis not present

## 2016-11-27 DIAGNOSIS — C649 Malignant neoplasm of unspecified kidney, except renal pelvis: Secondary | ICD-10-CM | POA: Diagnosis not present

## 2016-11-27 DIAGNOSIS — E039 Hypothyroidism, unspecified: Secondary | ICD-10-CM | POA: Diagnosis not present

## 2016-11-27 NOTE — Telephone Encounter (Signed)
Called daughter Boone Master, proxy. Got VM left message that her father was seen today. She may call here Monday for any questions or concerns

## 2016-11-27 NOTE — Progress Notes (Signed)
JACKSEN, ISIP ([id number]) Visit Report for 11/26/2016 Abuse/Suicide Risk Screen Details Patient Name: Hunter Hardin, Hunter Hardin. Date of Service: 11/26/2016 10:00 AM Medical Record Number: [id number] Patient Account Number: [id number] Date of Birth/Sex: [birth year] (81 y.o. Male) Treating RN: Cornell Barman Primary Care Physician: Elsie Stain Other Clinician: Referring Physician: Eliezer Lofts Treating Physician/Extender: Frann Rider in Treatment: 0 Abuse/Suicide Risk Screen Items Answer ABUSE/SUICIDE RISK SCREEN: Has anyone close to you tried to hurt or harm you recentlyo No Do you feel uncomfortable with anyone in your familyo No Has anyone forced you do things that you didnot want to doo No Do you have any thoughts of harming yourselfo No Patient displays signs or symptoms of abuse and/or neglect. No Electronic Signature(s) Signed: 11/26/2016 5:13:44 PM By: Gretta Cool, RN, BSN, Kim RN, BSN Entered By: Gretta Cool, RN, BSN, Kim on 11/26/2016 10:24:12 Hunter Hardin ([id number]) -------------------------------------------------------------------------------- Activities of Daily Living Details Patient Name: Hunter Hardin, Hunter Hardin. Date of Service: 11/26/2016 10:00 AM Medical Record Number: [id number] Patient Account Number: [id number] Date of Birth/Sex: 1935/05/02 (81 y.o. Male) Treating RN: Cornell Barman Primary Care Physician: Elsie Stain Other Clinician: Referring Physician: Eliezer Lofts Treating Physician/Extender: Frann Rider in Treatment: 0 Activities of Daily Living Items Answer Activities of Daily Living (Please select one for each item) Drive Automobile Completely Able Take Medications Completely Able Use Telephone Completely Able Care for Appearance Completely Able Use Toilet Completely Able Bath / Shower Completely Able Dress Self Completely Able Feed Self Completely Able Walk Completely Able Get In / Out Bed Completely Able Housework Completely Able Prepare Meals  Completely Able Handle Money Completely Able Shop for Self Completely Able Electronic Signature(s) Signed: 11/26/2016 5:13:44 PM By: Gretta Cool, RN, BSN, Kim RN, BSN Entered By: Gretta Cool, RN, BSN, Kim on 11/26/2016 10:24:27 Hunter Hardin ([id number]) -------------------------------------------------------------------------------- Education Assessment Details Patient Name: Hunter Hardin Date of Service: 11/26/2016 10:00 AM Medical Record Number: [id number] Patient Account Number: [id number] Date of Birth/Sex: 1935/01/09 (81 y.o. Male) Treating RN: Cornell Barman Primary Care Physician: Elsie Stain Other Clinician: Referring Physician: Eliezer Lofts Treating Physician/Extender: Frann Rider in Treatment: 0 Primary Learner Assessed: Patient Learning Preferences/Education Level/Primary Language Learning Preference: Explanation, Demonstration Highest Education Level: High School Preferred Language: English Cognitive Barrier Assessment/Beliefs Language Barrier: No Translator Needed: No Memory Deficit: No Emotional Barrier: No Cultural/Religious Beliefs Affecting Medical No Care: Physical Barrier Assessment Impaired Vision: Yes Glasses Impaired Hearing: No Decreased Hand dexterity: No Knowledge/Comprehension Assessment Knowledge Level: Medium Comprehension Level: Medium Ability to understand written Medium instructions: Ability to understand verbal Medium instructions: Motivation Assessment Anxiety Level: Calm Cooperation: Cooperative Education Importance: Acknowledges Need Interest in Health Problems: Asks Questions Perception: Coherent Willingness to Engage in Self- High Management Activities: Readiness to Engage in Self- High Management Activities: Electronic Signature(s) Hunter Hardin, Hunter Hardin ([id number]) Signed: 11/26/2016 5:13:44 PM By: Gretta Cool, RN, BSN, Kim RN, BSN Entered By: Gretta Cool, RN, BSN, Kim on 11/26/2016 10:25:29 Hunter Hardin  ([id number]) -------------------------------------------------------------------------------- Fall Risk Assessment Details Patient Name: Hunter Hardin Date of Service: 11/26/2016 10:00 AM Medical Record Number: [id number] Patient Account Number: [id number] Date of Birth/Sex: April 11, 1935 (81 y.o. Male) Treating RN: Cornell Barman Primary Care Physician: Elsie Stain Other Clinician: Referring Physician: Eliezer Lofts Treating Physician/Extender: Frann Rider in Treatment: 0 Fall Risk Assessment Items Have you had 2 or more falls in the last 12 monthso 0 No Have you had any fall that resulted in injury in the last 12 monthso 0 No FALL RISK ASSESSMENT: History of falling - immediate or within  3 months 0 No Secondary diagnosis 0 No Ambulatory aid None/bed rest/wheelchair/nurse 0 Yes Crutches/cane/walker 0 No Furniture 0 No IV Access/Saline Lock 0 No Gait/Training Normal/bed rest/immobile 0 Yes Weak 0 No Impaired 0 No Mental Status Oriented to own ability 0 Yes Electronic Signature(s) Signed: 11/26/2016 5:13:44 PM By: Gretta Cool, RN, BSN, Kim RN, BSN Entered By: Gretta Cool, RN, BSN, Kim on 11/26/2016 10:26:43 Hunter Hardin ([id number]) -------------------------------------------------------------------------------- Nutrition Risk Assessment Details Patient Name: Hunter Hardin. Date of Service: 11/26/2016 10:00 AM Medical Record Number: [id number] Patient Account Number: [id number] Date of Birth/Sex: [birth year] (81 y.o. Male) Treating RN: Cornell Barman Primary Care Physician: Elsie Stain Other Clinician: Referring Physician: Eliezer Lofts Treating Physician/Extender: Frann Rider in Treatment: 0 Height (in): 67 Weight (lbs): 138 Body Mass Index (BMI): 21.6 Nutrition Risk Assessment Items NUTRITION RISK SCREEN: I have an illness or condition that made me change the kind and/or 0 No amount of food I eat I eat fewer than two meals per day 0 No I eat few fruits and  vegetables, or milk products 0 No I have three or more drinks of beer, liquor or wine almost every day 0 No I have tooth or mouth problems that make it hard for me to eat 0 No I don't always have enough money to buy the food I need 0 No I eat alone most of the time 1 Yes I take three or more different prescribed or over-the-counter drugs a 1 Yes day Without wanting to, I have lost or gained 10 pounds in the last six 0 No months I am not always physically able to shop, cook and/or feed myself 0 No Nutrition Protocols Good Risk Protocol 0 No interventions needed Moderate Risk Protocol Electronic Signature(s) Signed: 11/26/2016 5:13:44 PM By: Gretta Cool, RN, BSN, Kim RN, BSN Entered By: Gretta Cool, RN, BSN, Kim on 11/26/2016 10:27:09

## 2016-11-27 NOTE — Progress Notes (Signed)
Hunter Hardin ([id number]) Visit Report for 11/26/2016 Allergy List Details Patient Name: Hunter Hardin, Hunter Hardin. Date of Service: 11/26/2016 10:00 AM Medical Record Number: [id number] Patient Account Number: [id number] Date of Birth/Sex: 1935-11-20 (81 y.o. Male) Treating RN: Cornell Barman Primary Care Physician: Elsie Stain Other Clinician: Referring Physician: Eliezer Lofts Treating Physician/Extender: Frann Rider in Treatment: 0 Allergies Active Allergies No Known Allergies Allergy Notes Electronic Signature(s) Signed: 11/26/2016 5:13:44 PM By: Gretta Cool, RN, BSN, Kim RN, BSN Entered By: Gretta Cool, RN, BSN, Kim on 11/26/2016 10:11:57 Hunter Hardin ([id number]) -------------------------------------------------------------------------------- Arrival Information Details Patient Name: Hunter Hardin Date of Service: 11/26/2016 10:00 AM Medical Record Number: [id number] Patient Account Number: [id number] Date of Birth/Sex: [birth year] (81 y.o. Male) Treating RN: Cornell Barman Primary Care Physician: Elsie Stain Other Clinician: Referring Physician: Eliezer Lofts Treating Physician/Extender: Frann Rider in Treatment: 0 Visit Information Patient Arrived: Wheel Chair Arrival Time: 10:09 Accompanied By: cousins wife, Silva Bandy Transfer Assistance: Manual Patient Identification Verified: Yes Secondary Verification Process Yes Completed: Patient Has Alerts: Yes Electronic Signature(s) Signed: 11/26/2016 5:13:44 PM By: Gretta Cool, RN, BSN, Kim RN, BSN Entered By: Gretta Cool, RN, BSN, Kim on 11/26/2016 10:10:39 Hunter Hardin ([id number]) -------------------------------------------------------------------------------- Clinic Level of Care Assessment Details Patient Name: [name], [name]. Date of Service: 11/26/2016 10:00 AM Medical Record Number: [id number] Patient Account Number: [id number] Date of Birth/Sex: 22-Jan-1935 (81 y.o. Male) Treating RN: Montey Hora Primary Care  Physician: Elsie Stain Other Clinician: Referring Physician: Eliezer Lofts Treating Physician/Extender: Frann Rider in Treatment: 0 Clinic Level of Care Assessment Items TOOL 2 Quantity Score '[]'$  - Use when only an EandM is performed on the INITIAL visit 0 ASSESSMENTS - Nursing Assessment / Reassessment X - General Physical Exam (combine w/ comprehensive assessment (listed just 1 20 below) when performed on new pt. evals) X - Comprehensive Assessment (HX, ROS, Risk Assessments, Wounds Hx, etc.) 1 25 ASSESSMENTS - Wound and Skin Assessment / Reassessment '[]'$  - Simple Wound Assessment / Reassessment - one wound 0 X - Complex Wound Assessment / Reassessment - multiple wounds 3 5 '[]'$  - Dermatologic / Skin Assessment (not related to wound area) 0 ASSESSMENTS - Ostomy and/or Continence Assessment and Care '[]'$  - Incontinence Assessment and Management 0 '[]'$  - Ostomy Care Assessment and Management (repouching, etc.) 0 PROCESS - Coordination of Care X - Simple Patient / Family Education for ongoing care 1 15 '[]'$  - Complex (extensive) Patient / Family Education for ongoing care 0 '[]'$  - Staff obtains Programmer, systems, Records, Test Results / Process Orders 0 '[]'$  - Staff telephones HHA, Nursing Homes / Clarify orders / etc 0 '[]'$  - Routine Transfer to another Facility (non-emergent condition) 0 '[]'$  - Routine Hospital Admission (non-emergent condition) 0 X - New Admissions / Biomedical engineer / Ordering NPWT, Apligraf, etc. 1 15 '[]'$  - Emergency Hospital Admission (emergent condition) 0 X - Simple Discharge Coordination 1 10 [name], [name] ([id number]) '[]'$  - Complex (extensive) Discharge Coordination 0 PROCESS - Special Needs '[]'$  - Pediatric / Minor Patient Management 0 '[]'$  - Isolation Patient Management 0 '[]'$  - Hearing / Language / Visual special needs 0 '[]'$  - Assessment of Community assistance (transportation, D/C planning, etc.) 0 '[]'$  - Additional assistance / Altered mentation 0 '[]'$  - Support  Surface(s) Assessment (bed, cushion, seat, etc.) 0 INTERVENTIONS - Wound Cleansing / Measurement X - Wound Imaging (photographs - any number of wounds) 1 5 '[]'$  - Wound Tracing (instead of photographs) 0 '[]'$  - Simple Wound Measurement - one wound 0 X - Complex Wound  Measurement - multiple wounds 3 5 '[]'$  - Simple Wound Cleansing - one wound 0 X - Complex Wound Cleansing - multiple wounds 3 5 INTERVENTIONS - Wound Dressings '[]'$  - Small Wound Dressing one or multiple wounds 0 X - Medium Wound Dressing one or multiple wounds 3 15 '[]'$  - Large Wound Dressing one or multiple wounds 0 '[]'$  - Application of Medications - injection 0 INTERVENTIONS - Miscellaneous '[]'$  - External ear exam 0 '[]'$  - Specimen Collection (cultures, biopsies, blood, body fluids, etc.) 0 '[]'$  - Specimen(s) / Culture(s) sent or taken to Lab for analysis 0 '[]'$  - Patient Transfer (multiple staff / Harrel Lemon Lift / Similar devices) 0 '[]'$  - Simple Staple / Suture removal (25 or less) 0 '[]'$  - Complex Staple / Suture removal (26 or more) 0 [name], [name] ([id number]) '[]'$  - Hypo / Hyperglycemic Management (close monitor of Blood Glucose) 0 X - Ankle / Brachial Index (ABI) - do not check if billed separately 1 15 Has the patient been seen at the hospital within the last three years: Yes Total Score: 195 Level Of Care: New/Established - Level 5 Electronic Signature(s) Signed: 11/26/2016 5:03:06 PM By: Montey Hora Entered By: Montey Hora on 11/26/2016 11:34:56 Hunter Hardin ([id number]) -------------------------------------------------------------------------------- Encounter Discharge Information Details Patient Name: Hunter Hardin. Date of Service: 11/26/2016 10:00 AM Medical Record Number: [id number] Patient Account Number: [id number] Date of Birth/Sex: [birth year] (81 y.o. Male) Treating RN: Montey Hora Primary Care Physician: Elsie Stain Other Clinician: Referring Physician: Eliezer Lofts Treating Physician/Extender:  Frann Rider in Treatment: 0 Encounter Discharge Information Items Discharge Pain Level: 0 Discharge Condition: Stable Ambulatory Status: Wheelchair Discharge Destination: Home Transportation: Private Auto Accompanied By: cousin's wife Schedule Follow-up Appointment: Yes Medication Reconciliation completed and provided to Patient/Care No Elia Nunley: Provided on Clinical Summary of Care: 11/26/2016 Form Type Recipient Paper Patient JS Electronic Signature(s) Signed: 11/26/2016 12:25:26 PM By: Montey Hora Previous Signature: 11/26/2016 11:58:30 AM Version By: Ruthine Dose Entered By: Montey Hora on 11/26/2016 12:25:26 Hunter Hardin ([id number]) -------------------------------------------------------------------------------- Lower Extremity Assessment Details Patient Name: Hunter Hardin Date of Service: 11/26/2016 10:00 AM Medical Record Number: [id number] Patient Account Number: [id number] Date of Birth/Sex: 26-Jan-1935 (81 y.o. Male) Treating RN: Cornell Barman Primary Care Physician: Elsie Stain Other Clinician: Referring Physician: Eliezer Lofts Treating Physician/Extender: Frann Rider in Treatment: 0 Edema Assessment Assessed: [Left: No] [Right: No] E[Left: dema] [Right: :] Calf Left: Right: Point of Measurement: 33 cm From Medial Instep 31 cm 29.7 cm Ankle Left: Right: Point of Measurement: 10 cm From Medial Instep 23.5 cm 23.5 cm Vascular Assessment Pulses: Posterior Tibial Palpable: [Left:Yes] [Right:Yes] Doppler: [Left:Multiphasic] [Right:Multiphasic] Dorsalis Pedis Palpable: [Left:Yes] [Right:Yes] Doppler: [Left:Multiphasic] [Right:Multiphasic] Extremity colors, hair growth, and conditions: Extremity Color: [Left:Red] [Right:Red] Hair Growth on Extremity: [Left:No] [Right:No] Temperature of Extremity: [Left:Warm] [Right:Warm] Capillary Refill: [Left:< 3 seconds] [Right:< 3 seconds] Blood Pressure: Brachial: [Left:102]  [Right:104] Dorsalis Pedis: 104 [Left:Dorsalis Pedis: 102] Ankle: Posterior Tibial: 110 [Left:Posterior Tibial: 118 1.06] [Right:1.13] Toe Nail Assessment Left: Right: Thick: Yes Yes Discolored: No No Deformed: No No Improper Length and Hygiene: No No Hunter Hardin, Hunter Hardin ([id number]) Electronic Signature(s) Signed: 11/26/2016 5:03:06 PM By: Montey Hora Signed: 11/26/2016 5:13:44 PM By: Gretta Cool, RN, BSN, Kim RN, BSN Entered By: Montey Hora on 11/26/2016 11:24:43 Hunter Hardin ([id number]) -------------------------------------------------------------------------------- Multi Wound Chart Details Patient Name: Hunter Hardin. Date of Service: 11/26/2016 10:00 AM Medical Record Number: [id number] Patient Account Number: [id number] Date of Birth/Sex: 02/25/1935 (81 y.o. Male) Treating RN: Cornell Barman  Primary Care Physician: Elsie Stain Other Clinician: Referring Physician: Eliezer Lofts Treating Physician/Extender: Frann Rider in Treatment: 0 Vital Signs Height(in): 67 Pulse(bpm): 63 Weight(lbs): 138 Blood Pressure 105/84 (mmHg): Body Mass Index(BMI): 22 Temperature(F): 97.5 Respiratory Rate 18 (breaths/min): Photos: Wound Location: Left Lower Leg - Lateral Right Malleolus - Medial Left Malleolus - Lateral Wounding Event: Gradually Appeared Gradually Appeared Gradually Appeared Primary Etiology: Venous Leg Ulcer Venous Leg Ulcer Venous Leg Ulcer Comorbid History: Osteoarthritis, Received Osteoarthritis, Received Osteoarthritis, Received Chemotherapy Chemotherapy Chemotherapy Date Acquired: 11/09/2016 11/09/2016 11/09/2016 Weeks of Treatment: 0 0 0 Wound Status: Open Open Open Clustered Wound: Yes Yes No Measurements L x W x D 19.5x9.5x0.1 1.7x3.4x0.1 1.6x2.1x0.1 (cm) Area (cm) : 145.495 4.54 2.639 Volume (cm) : 14.55 0.454 0.264 Classification: Partial Thickness Partial Thickness Partial Thickness Exudate Amount: Large Large Large Exudate Type:  Serous Serous Serous Exudate Color: amber amber amber Wound Margin: Flat and Intact Flat and Intact Flat and Intact Granulation Amount: Small (1-33%) Medium (34-66%) Medium (34-66%) Granulation Quality: Red Red Red Necrotic Amount: Small (1-33%) Medium (34-66%) Medium (34-66%) Exposed Structures: Fascia: No Fascia: No Fascia: No Fat: No Fat: No Fat: No Hunter Hardin, Hunter Hardin. ([id number]) Tendon: No Tendon: No Tendon: No Muscle: No Muscle: No Muscle: No Joint: No Joint: No Joint: No Bone: No Bone: No Bone: No Limited to Skin Limited to Skin Limited to Skin Breakdown Breakdown Breakdown Epithelialization: Small (1-33%) None None Periwound Skin Texture: Edema: No Edema: No Edema: No Excoriation: No Excoriation: No Excoriation: No Induration: No Induration: No Induration: No Callus: No Callus: No Callus: No Crepitus: No Crepitus: No Crepitus: No Fluctuance: No Fluctuance: No Fluctuance: No Friable: No Friable: No Friable: No Rash: No Rash: No Rash: No Scarring: No Scarring: No Scarring: No Periwound Skin Maceration: Yes Moist: Yes Moist: Yes Moisture: Moist: Yes Maceration: No Maceration: No Dry/Scaly: No Dry/Scaly: No Dry/Scaly: No Periwound Skin Color: Erythema: Yes Erythema: Yes Erythema: Yes Atrophie Blanche: No Atrophie Blanche: No Atrophie Blanche: No Cyanosis: No Cyanosis: No Cyanosis: No Ecchymosis: No Ecchymosis: No Ecchymosis: No Hemosiderin Staining: No Hemosiderin Staining: No Hemosiderin Staining: No Mottled: No Mottled: No Mottled: No Pallor: No Pallor: No Pallor: No Rubor: No Rubor: No Rubor: No Erythema Location: Circumferential Circumferential Circumferential Temperature: No Abnormality No Abnormality No Abnormality Tenderness on Yes Yes Yes Palpation: Wound Preparation: Ulcer Cleansing: Ulcer Cleansing: Ulcer Cleansing: Rinsed/Irrigated with Rinsed/Irrigated with Rinsed/Irrigated with Saline Saline Saline Topical  Anesthetic Topical Anesthetic Topical Anesthetic Applied: Other: lidocaine Applied: Other: lidocaine Applied: Other: lidocaine 4% 4% 4% Treatment Notes Electronic Signature(s) Signed: 11/26/2016 5:13:44 PM By: Gretta Cool, RN, BSN, Kim RN, BSN Entered By: Gretta Cool, RN, BSN, Kim on 11/26/2016 11:02:33 Hunter Hardin ([id number]) -------------------------------------------------------------------------------- [place] Details Patient Name: Hunter Hardin, Hunter Hardin. Date of Service: 11/26/2016 10:00 AM Medical Record Number: [id number] Patient Account Number: [id number] Date of Birth/Sex: 01/25/1935 (81 y.o. Male) Treating RN: Cornell Barman Primary Care Physician: Elsie Stain Other Clinician: Referring Physician: Eliezer Lofts Treating Physician/Extender: Frann Rider in Treatment: 0 Active Inactive Abuse / Safety / Falls / Self Care Management Nursing Diagnoses: Impaired physical mobility Potential for falls Goals: Patient will remain injury free Date Initiated: 11/26/2016 Goal Status: Active Interventions: Assess fall risk on admission and as needed Notes: Nutrition Nursing Diagnoses: Potential for alteratiion in Nutrition/Potential for imbalanced nutrition Goals: Patient/caregiver agrees to and verbalizes understanding of need to use nutritional supplements and/or vitamins as prescribed Date Initiated: 11/26/2016 Goal Status: Active Interventions: Assess patient nutrition upon admission and as needed per  policy Notes: Orientation to the Wound Care Program Nursing Diagnoses: Knowledge deficit related to the wound healing center program Goals: Hunter Hardin, Hunter Hardin ([id number]) Patient/caregiver will verbalize understanding of the [place] Program Date Initiated: 11/26/2016 Goal Status: Active Interventions: Provide education on orientation to the wound center Notes: Venous Leg Ulcer Nursing Diagnoses: Potential for venous Insuffiency (use before  diagnosis confirmed) Goals: Non-invasive venous studies are completed as ordered Date Initiated: 11/26/2016 Goal Status: Active Patient will maintain optimal edema control Date Initiated: 11/26/2016 Goal Status: Active Interventions: Assess peripheral edema status every visit. Notes: Wound/Skin Impairment Nursing Diagnoses: Impaired tissue integrity Goals: Patient/caregiver will verbalize understanding of skin care regimen Date Initiated: 11/26/2016 Goal Status: Active Ulcer/skin breakdown will have a volume reduction of 30% by week 4 Date Initiated: 11/26/2016 Goal Status: Active Ulcer/skin breakdown will have a volume reduction of 50% by week 8 Date Initiated: 11/26/2016 Goal Status: Active Ulcer/skin breakdown will have a volume reduction of 80% by week 12 Date Initiated: 11/26/2016 Goal Status: Active Ulcer/skin breakdown will heal within 14 weeks Date Initiated: 11/26/2016 Hunter Hardin, Hunter Hardin ([id number]) Goal Status: Active Interventions: Assess patient/caregiver ability to obtain necessary supplies Assess patient/caregiver ability to perform ulcer/skin care regimen upon admission and as needed Assess ulceration(s) every visit Notes: Electronic Signature(s) Signed: 11/26/2016 5:13:44 PM By: Gretta Cool, RN, BSN, Kim RN, BSN Entered By: Gretta Cool, RN, BSN, Kim on 11/26/2016 11:02:06 Hunter Hardin ([id number]) -------------------------------------------------------------------------------- Pain Assessment Details Patient Name: Hunter Hardin. Date of Service: 11/26/2016 10:00 AM Medical Record Number: [id number] Patient Account Number: [id number] Date of Birth/Sex: [birth year] (81 y.o. Male) Treating RN: Cornell Barman Primary Care Physician: Elsie Stain Other Clinician: Referring Physician: Eliezer Lofts Treating Physician/Extender: Frann Rider in Treatment: 0 Active Problems Location of Pain Severity and Description of Pain Patient Has Paino No Site Locations With  Dressing Change: No Pain Management and Medication Current Pain Management: Electronic Signature(s) Signed: 11/26/2016 5:13:44 PM By: Gretta Cool, RN, BSN, Kim RN, BSN Entered By: Gretta Cool, RN, BSN, Kim on 11/26/2016 10:10:47 Hunter Hardin ([id number]) -------------------------------------------------------------------------------- Patient/Caregiver Education Details Patient Name: Hunter Hardin Date of Service: 11/26/2016 10:00 AM Medical Record Number: [id number] Patient Account Number: [id number] Date of Birth/Gender: 30-May-1935 (81 y.o. Male) Treating RN: Montey Hora Primary Care Physician: Elsie Stain Other Clinician: Referring Physician: Eliezer Lofts Treating Physician/Extender: Frann Rider in Treatment: 0 Education Assessment Education Provided To: Patient and Caregiver Education Topics Provided Wound/Skin Impairment: Handouts: Other: wound care and leg elevation Methods: Demonstration, Explain/Verbal, Printed Responses: State content correctly Electronic Signature(s) Signed: 11/26/2016 5:03:06 PM By: Montey Hora Entered By: Montey Hora on 11/26/2016 12:25:46 Hunter Hardin ([id number]) -------------------------------------------------------------------------------- Wound Assessment Details Patient Name: Hunter Hardin. Date of Service: 11/26/2016 10:00 AM Medical Record Number: [id number] Patient Account Number: [id number] Date of Birth/Sex: 09-19-1935 (81 y.o. Male) Treating RN: Cornell Barman Primary Care Physician: Elsie Stain Other Clinician: Referring Physician: Eliezer Lofts Treating Physician/Extender: Frann Rider in Treatment: 0 Wound Status Wound Number: 1 Primary Venous Leg Ulcer Etiology: Wound Location: Left Lower Leg - Lateral Wound Status: Open Wounding Event: Gradually Appeared Comorbid Osteoarthritis, Received Date Acquired: 11/09/2016 History: Chemotherapy Weeks Of Treatment: 0 Clustered Wound: Yes Photos Wound  Measurements Length: (cm) 19.5 Width: (cm) 9.5 Depth: (cm) 0.1 Area: (cm) 145.495 Volume: (cm) 14.55 % Reduction in Area: % Reduction in Volume: Epithelialization: Small (1-33%) Tunneling: No Undermining: No Wound Description Classification: Partial Thickness Foul Odor Afte Wound Margin: Flat and Intact Exudate Amount: Large Exudate Type: Serous Exudate Color: amber r  Cleansing: No Wound Bed Granulation Amount: Small (1-33%) Exposed Structure Granulation Quality: Red Fascia Exposed: No Necrotic Amount: Small (1-33%) Fat Layer Exposed: No Necrotic Quality: Adherent Slough Tendon Exposed: No Muscle Exposed: No Hunter Hardin, Hunter Hardin ([id number]) Joint Exposed: No Bone Exposed: No Limited to Skin Breakdown Periwound Skin Texture Texture Color No Abnormalities Noted: No No Abnormalities Noted: No Callus: No Atrophie Blanche: No Crepitus: No Cyanosis: No Excoriation: No Ecchymosis: No Fluctuance: No Erythema: Yes Friable: No Erythema Location: Circumferential Induration: No Hemosiderin Staining: No Localized Edema: No Mottled: No Rash: No Pallor: No Scarring: No Rubor: No Moisture Temperature / Pain No Abnormalities Noted: No Temperature: No Abnormality Dry / Scaly: No Tenderness on Palpation: Yes Maceration: Yes Moist: Yes Wound Preparation Ulcer Cleansing: Rinsed/Irrigated with Saline Topical Anesthetic Applied: Other: lidocaine 4%, Treatment Notes Wound #1 (Left, Lateral Lower Leg) 1. Cleansed with: Clean wound with Normal Saline 2. Anesthetic Topical Lidocaine 4% cream to wound bed prior to debridement 4. Dressing Applied: Foam Contact layer 7. Secured with Tape Other (specify in notes) Notes kerlix and coban from toes to 3cm below the knee Electronic Signature(s) Signed: 11/26/2016 5:13:44 PM By: Gretta Cool, RN, BSN, Kim RN, BSN Entered By: Gretta Cool, RN, BSN, Kim on 11/26/2016 10:39:18 Hunter Hardin  ([id number]) -------------------------------------------------------------------------------- Wound Assessment Details Patient Name: Hunter Hardin, Hunter Hardin. Date of Service: 11/26/2016 10:00 AM Medical Record Number: [id number] Patient Account Number: [id number] Date of Birth/Sex: 1935/06/20 (81 y.o. Male) Treating RN: Cornell Barman Primary Care Physician: Elsie Stain Other Clinician: Referring Physician: Eliezer Lofts Treating Physician/Extender: Frann Rider in Treatment: 0 Wound Status Wound Number: 2 Primary Venous Leg Ulcer Etiology: Wound Location: Right Malleolus - Medial Wound Status: Open Wounding Event: Gradually Appeared Comorbid Osteoarthritis, Received Date Acquired: 11/09/2016 History: Chemotherapy Weeks Of Treatment: 0 Clustered Wound: Yes Photos Wound Measurements Length: (cm) 1.7 Width: (cm) 3.4 Depth: (cm) 0.1 Area: (cm) 4.54 Volume: (cm) 0.454 % Reduction in Area: % Reduction in Volume: Epithelialization: None Tunneling: No Undermining: No Wound Description Classification: Partial Thickness Wound Margin: Flat and Intact Exudate Amount: Large Exudate Type: Serous Exudate Color: amber Foul Odor After Cleansing: No Wound Bed Granulation Amount: Medium (34-66%) Exposed Structure Granulation Quality: Red Fascia Exposed: No Necrotic Amount: Medium (34-66%) Fat Layer Exposed: No Necrotic Quality: Adherent Slough Tendon Exposed: No Muscle Exposed: No Hunter Hardin, Hunter Hardin ([id number]) Joint Exposed: No Bone Exposed: No Limited to Skin Breakdown Periwound Skin Texture Texture Color No Abnormalities Noted: No No Abnormalities Noted: No Callus: No Atrophie Blanche: No Crepitus: No Cyanosis: No Excoriation: No Ecchymosis: No Fluctuance: No Erythema: Yes Friable: No Erythema Location: Circumferential Induration: No Hemosiderin Staining: No Localized Edema: No Mottled: No Rash: No Pallor: No Scarring: No Rubor: No Moisture Temperature /  Pain No Abnormalities Noted: No Temperature: No Abnormality Dry / Scaly: No Tenderness on Palpation: Yes Maceration: No Moist: Yes Wound Preparation Ulcer Cleansing: Rinsed/Irrigated with Saline Topical Anesthetic Applied: Other: lidocaine 4%, Treatment Notes Wound #2 (Right, Medial Malleolus) 1. Cleansed with: Clean wound with Normal Saline 2. Anesthetic Topical Lidocaine 4% cream to wound bed prior to debridement 4. Dressing Applied: Foam Contact layer 7. Secured with Tape Other (specify in notes) Notes kerlix and coban from toes to 3cm below the knee Electronic Signature(s) Signed: 11/26/2016 5:13:44 PM By: Gretta Cool, RN, BSN, Kim RN, BSN Entered By: Gretta Cool, RN, BSN, Kim on 11/26/2016 10:40:49 Hunter Hardin ([id number]) -------------------------------------------------------------------------------- Wound Assessment Details Patient Name: Hunter Hardin, Hunter Hardin. Date of Service: 11/26/2016 10:00 AM Medical Record Number: [id number] Patient Account Number:  [id number] Date of Birth/Sex: 22-Dec-1934 (81 y.o. Male) Treating RN: Cornell Barman Primary Care Physician: Elsie Stain Other Clinician: Referring Physician: Eliezer Lofts Treating Physician/Extender: Frann Rider in Treatment: 0 Wound Status Wound Number: 3 Primary Venous Leg Ulcer Etiology: Wound Location: Left Malleolus - Lateral Wound Status: Open Wounding Event: Gradually Appeared Comorbid Osteoarthritis, Received Date Acquired: 11/09/2016 History: Chemotherapy Weeks Of Treatment: 0 Clustered Wound: No Photos Wound Measurements Length: (cm) 1.6 Width: (cm) 2.1 Depth: (cm) 0.1 Area: (cm) 2.639 Volume: (cm) 0.264 % Reduction in Area: % Reduction in Volume: Epithelialization: None Tunneling: No Undermining: No Wound Description Classification: Partial Thickness Wound Margin: Flat and Intact Exudate Amount: Large Exudate Type: Serous Exudate Color: amber Foul Odor After Cleansing: No Wound  Bed Granulation Amount: Medium (34-66%) Exposed Structure Granulation Quality: Red Fascia Exposed: No Necrotic Amount: Medium (34-66%) Fat Layer Exposed: No Necrotic Quality: Adherent Slough Tendon Exposed: No Muscle Exposed: No Hunter Hardin, Hunter Hardin ([id number]) Joint Exposed: No Bone Exposed: No Limited to Skin Breakdown Periwound Skin Texture Texture Color No Abnormalities Noted: No No Abnormalities Noted: No Callus: No Atrophie Blanche: No Crepitus: No Cyanosis: No Excoriation: No Ecchymosis: No Fluctuance: No Erythema: Yes Friable: No Erythema Location: Circumferential Induration: No Hemosiderin Staining: No Localized Edema: No Mottled: No Rash: No Pallor: No Scarring: No Rubor: No Moisture Temperature / Pain No Abnormalities Noted: No Temperature: No Abnormality Dry / Scaly: No Tenderness on Palpation: Yes Maceration: No Moist: Yes Wound Preparation Ulcer Cleansing: Rinsed/Irrigated with Saline Topical Anesthetic Applied: Other: lidocaine 4%, Treatment Notes Wound #3 (Left, Lateral Malleolus) 1. Cleansed with: Clean wound with Normal Saline 2. Anesthetic Topical Lidocaine 4% cream to wound bed prior to debridement 4. Dressing Applied: Foam Contact layer 7. Secured with Tape Other (specify in notes) Notes kerlix and coban from toes to 3cm below the knee Electronic Signature(s) Signed: 11/26/2016 5:13:44 PM By: Gretta Cool, RN, BSN, Kim RN, BSN Entered By: Gretta Cool, RN, BSN, Kim on 11/26/2016 10:42:12 Hunter Hardin ([id number]) -------------------------------------------------------------------------------- [facility] Details Patient Name: Hunter Hardin Date of Service: 11/26/2016 10:00 AM Medical Record Number: [id number] Patient Account Number: [id number] Date of Birth/Sex: July 03, 1935 (81 y.o. Male) Treating RN: Cornell Barman Primary Care Physician: Elsie Stain Other Clinician: Referring Physician: Eliezer Lofts Treating Physician/Extender: Frann Rider in Treatment: 0 Vital Signs Time Taken: 10:10 Temperature (F): 97.5 Height (in): 67 Pulse (bpm): 63 Source: Stated Respiratory Rate (breaths/min): 18 Weight (lbs): 138 Blood Pressure (mmHg): 105/84 Source: Measured Reference Range: 80 - 120 mg / dl Body Mass Index (BMI): 21.6 Electronic Signature(s) Signed: 11/26/2016 5:13:44 PM By: Gretta Cool, RN, BSN, Kim RN, BSN Entered By: Gretta Cool, RN, BSN, Kim on 11/26/2016 10:11:40

## 2016-11-27 NOTE — Progress Notes (Signed)
Hematology and Oncology Follow Up Visit  Hunter Hardin [id number] Mar 14, 1935 81 y.o. 11/27/2016 4:28 PM   Principle Diagnosis: 81 year old gentleman with:  1. T3 N0 colon cancer: He is status post a laparoscopic right hemicolectomy, with 0 out of 21 lymph nodes involved. That was done on 03/06/2013.  2. Renal cell carcinoma diagnosed in May of 2014. He presented with kidney mass and lung metastasis. 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  Prior therapy: Started Votrient 800 mg daily on 05/22/2013. Dose was reduced to 600 mg daily on 01/19/14 due to weight loss and diarrhea. Then reduced to 400 mg starting on 09/07/2014. The dose will be reduced further to 200 mg daily starting on 11/16/2014. The dose reduction is related to diarrhea and weight loss. Therapy discontinued in December 2016 for disease progression.  Current therapy: Cabometyx 40 mg daily started in December 2016. This was changed to 20 mg daily in March 2017.  Interim History: Hunter Hardin presents today for a follow up visit. Since the last visit, he was hospitalized on 10/30/2016 with symptoms of dyspnea on exertion and worsening pleural effusion. He required thoracentesis which improved his a radiation and bleeding considerably. He was also noted to have lower leg edema and pressure ulcers and currently getting treated at the wound clinic. He has declined rather rapidly in the last 4 weeks and his mobility is quite limited. He is ambulating only short distances and limited to the chair mostly.  He continues to take Cabometyx at 20 mg daily and has no new complications related to it. His by mouth intake has been rather poor and of lost more weight. He still have some dyspnea on exertion although improved since his recent discharge.  He is not report any headaches, blurry vision or syncope. He denies chest pain or dyspnea on exertion. He denies abdominal pain. He  does not report any hematochezia. He has not reported any pain in his hands or feet. Has not reported any fevers or chills or sweats.  He denied any arthralgias or myalgias. He denies any back pain or hip pain. He is not report any lymphadenopathy or petechiae.  Remainder of his review of systems unremarkable.   Medications: I have reviewed the patient's current medications. Reviewed today and unchanged. Current Outpatient Prescriptions  Medication Sig Dispense Refill  . benzonatate (TESSALON) 200 MG capsule Take 1 capsule (200 mg total) by mouth 3 (three) times daily as needed for cough. 30 capsule 1  . cabozantinib S-Malate (CABOMETYX) 20 MG TABS Take 1 tablet by mouth daily. (Patient taking differently: Take 1 tablet by mouth every evening. ) 30 tablet 0  . doxazosin (CARDURA) 8 MG tablet TAKE 1 TABLET DAILY 90 tablet 2  . finasteride (PROSCAR) 5 MG tablet Take 5 mg by mouth daily.    . fish oil-omega-3 fatty acids 1000 MG capsule Take 1 g by mouth 2 (two) times daily.    . hydrochlorothiazide (HYDRODIURIL) 25 MG tablet TAKE 1 TABLET DAILY 90 tablet 1  . hydrocortisone 2.5 % cream Apply topically 3 (three) times daily as needed. 28 g 3  . KLOR-CON M20 20 MEQ tablet TAKE 1 TABLET TWICE A DAY 180 tablet 1  . lactose free nutrition (BOOST) LIQD Take 237 mLs by mouth daily.    [name] levothyroxine (SYNTHROID, LEVOTHROID) 50 MCG tablet TAKE 2 TABLETS ON SUNDAY AND 1 TABLET ON ALL OTHER DAYS (8 TABLETS A WEEK TOTAL)  105 tablet 1  . loperamide (IMODIUM A-D) 2 MG tablet Take 2 mg by mouth as needed for diarrhea or loose stools.    . ondansetron (ZOFRAN) 8 MG tablet Take 1 tablet (8 mg total) by mouth every 8 (eight) hours as needed. for nausea 20 tablet 3  . propranolol (INDERAL) 40 MG tablet Take 0.5 tablets (20 mg total) by mouth 2 (two) times daily. 90 tablet 3  . verapamil (CALAN-SR) 240 MG CR tablet TAKE 1 TABLET DAILY 90 tablet 2  . vitamin C (ASCORBIC ACID) 500 MG tablet Take 500 mg by mouth daily.     . furosemide (LASIX) 20 MG tablet      No current facility-administered medications for this visit.     Allergies: No Known Allergies  Past Medical History, Surgical history, Social history, and Family History were reviewed and updated.    Physical Exam: Blood pressure (!) 144/80, pulse 68, resp. rate 17, height '5\' 7"'$  (1.702 m), weight 136 lb 14.4 oz (62.1 kg), SpO2 95 %. ECOG: 2 General appearance: Chronically ill-appearing gentleman appeared without distress. Head: Normocephalic, without obvious abnormality no oral thrush noted. Neck: no adenopathy, no masses or lesions. No thyromegaly. Lymph nodes: Cervical, supraclavicular, and axillary nodes normal. Heart:regular rate and rhythm, S1, S2. Lung:chest clear, no wheezing, rales. Decreased breath sounds on the left side.  Chest wall examination: No tenderness or masses noted. Abdomen: soft, non-tender, without masses or organomegaly no rebound or guarding. EXT:  edema noted bilaterally in his lower extremities. Wounds are wrapped with pressure dressing. Skin: No erythema or induration noted.   Lab Results: Lab Results  Component Value Date   WBC 5.4 11/24/2016   HGB 12.9 (L) 11/24/2016   HCT 39.6 11/24/2016   MCV 101.2 (H) 11/24/2016   PLT 173 11/24/2016     CBC    Component Value Date/Time   WBC 5.4 11/24/2016 0900   WBC 7.4 11/06/2016 1227   RBC 3.91 (L) 11/24/2016 0900   RBC 3.69 (L) 11/06/2016 1227   HGB 12.9 (L) 11/24/2016 0900   HCT 39.6 11/24/2016 0900   PLT 173 11/24/2016 0900   MCV 101.2 (H) 11/24/2016 0900   MCH 33.1 11/24/2016 0900   MCH 33.6 10/30/2016 1701   MCHC 32.7 11/24/2016 0900   MCHC 33.4 11/06/2016 1227   RDW 15.1 (H) 11/24/2016 0900   LYMPHSABS 1.9 11/24/2016 0900   MONOABS 0.4 11/24/2016 0900   EOSABS 0.1 11/24/2016 0900   BASOSABS 0.0 11/24/2016 0900   EXAM: CT CHEST WITH CONTRAST  CT ABDOMEN AND PELVIS WITH AND WITHOUT CONTRAST  TECHNIQUE: Multidetector CT imaging of the  chest was performed during intravenous contrast administration. Multidetector CT imaging of the abdomen and pelvis was performed following the standard protocol before and during bolus administration of intravenous contrast.  CONTRAST:  185m ISOVUE-300 IOPAMIDOL (ISOVUE-300) INJECTION 61%  COMPARISON:  08/11/2016  FINDINGS: CT CHEST FINDINGS  Cardiovascular: The heart is normal in size. No pericardial effusion.  Three vessel coronary atherosclerosis.  Atherosclerotic calcifications of the aortic arch.  Mediastinum/Nodes: 11 mm short axis subcarinal node (series 7/ image 47), unchanged.  2.2 x 3.0 cm left pericardial mass (series 11/image 84), previously 2.4 x 3.2 cm, stable versus mildly decreased.  Visualized thyroid is unremarkable.  Lungs/Pleura: Large left pleural effusion with pleural thickening, malignant.  Associated compressive atelectasis of the left upper and lower lobes. Underlying 2.9 x 4.3 cm left lower lobe mass (series 11/ image 65), previously 2.6 x 4.6 cm, grossly unchanged.  Small right pleural effusion. Mild dependent atelectasis in the right lower lobe.  Calcified granuloma in the right middle lobe (series 15/ image 34). No suspicious pulmonary nodules.  No pneumothorax.  Musculoskeletal: Degenerative changes of the thoracic spine.  Metastasis with pathologic fracture involving the acromion of the left scapula (series 11/ image 7).  Metastasis pathologic fracture involving the left posterior 8th rib (series 11/image 52), new. Metastasis with pathologic fracture involving the left posterior 12th rib (series 11/image 101), new.  CT ABDOMEN AND PELVIS FINDINGS  Hepatobiliary: Liver is notable for mild intrahepatic ductal dilatation.  Status post cholecystectomy. No intrahepatic or extrahepatic ductal dilatation.  Pancreas: 5 mm hypoenhancing lesion in the pancreatic head/ uncinate process (series 11/ image 114), likely  reflecting a benign pancreatic cyst versus invagination of benign extra pancreatic fat.  Spleen: Within normal limits.  Adrenals/Urinary Tract: Adrenal glands are within normal limits.  Dominant lateral upper pole renal mass measures 5.7 x 4.4 x 5.2 cm (series 11/ image 119), previously 5.9 x 4.3 x 5.6 cm, grossly unchanged. Adjacent 2.0 cm enhancing mural nodule along the medial aspect of the lesion and typically in the upper pole of the kidney (series 11/ image 122), previously 1.7 cm.  Right kidney is within normal limits.  No hydronephrosis.  Mildly thick-walled bladder with indwelling Foley catheter.  Stomach/Bowel: Stomach is within normal limits.  No evidence of bowel obstruction.  Status post right hemicolectomy with appendectomy.  Moderate left colonic stool burden.  Vascular/Lymphatic: No evidence of abdominal aortic aneurysm.  Atherosclerotic calcifications of the abdominal aorta and branch vessels.  No suspicious abdominopelvic lymphadenopathy.  Reproductive: Prostatomegaly with dystrophic calcifications.  Other: Large left inguinal/ scrotal hernia containing fat and nondilated sigmoid colon (series 11/ image 200), unchanged.  Trace pelvic ascites (series 11/ image 173).  Musculoskeletal: Degenerative changes of the thoracic spine.  Sclerotic lesion/bone island in the right posterior iliac bone (series 11/ image 145).  IMPRESSION: 5.7 cm left upper pole renal cell carcinoma with associated 2.0 cm enhancing mural nodule, grossly unchanged.  3.0 cm left pericardial mass and 4.3 cm left lower lobe mass, grossly unchanged.  Large left pleural effusion with associated pleural thickening, malignant, grossly unchanged.  11 mm short axis subcarinal node, suspicious for metastasis, grossly unchanged.  Osseous metastasis with pathologic fracture involving the left acromion, unchanged. New osseous metastases with pathologic  fracture involving the left posterior 8th and 12th ribs.     81 year old gentleman with the following issues.   1. Colon cancer: He is status post a laparoscopic right hemicolectomy, with the pathology revealing a T3 N0, with 0 out of 21 lymph nodes involved. He did not receive adjuvant chemotherapy for colon cancer given that he has stage IV kidney cancer.    No evidence of relapse no recent imaging studies in December 2017. I recommended continued observation and surveillance at this time.  2. Renal cell cancer: he has stage IV disease with lung involvment. He was initially treated with Votrient and subsequently developed progression of disease.   He has been on Cabometyx since December 2017. His dose has been reduced from 40 mg to 20 mg and have tolerated it much better.   CT scan on 11/24/2016 showed for the most part to a stable disease.  Overall his clinical status have deteriorated with persistent pleural effusion and possible need for recurrent thoracentesis. He also had some pathological fractures in his bones. His primary kidney tumor appears to be unchanged.  Risks and benefits of continuing this  current medication was discussed with the patient. He understands stopping this medication would likely result in hospice enrollment in the immediate future. He understands that hospice is his future regardless and I'm hoping with this medication the skin delay his progression and might improve his quality of life as it has for the last 3 years.  For the time being he would like to continue on this medication and we'll continue to follow his clinical status. If he continues to decline, I would recommend hospice enrollment at that time.   3. Weight loss: related to Cabometyx. We have discussed different strategies to boost his appetite and he is willing to try some of that including Megace and nutritional supplements.  4. Diarrhea. Seems to be manageable with the dose reduction. He does  require Imodium periodically. Not an issue at this time.  5. Urinary retention: His urinary symptoms are back to baseline at this time.  6. Lower leg edema and pulses: He continues to follow with the pain clinic regarding this issue. His edema and generalized anasarca was related to low albumin state related to poor nutrition. We have discussed different strategies to increase his protein intake and attempt to decrease the progression of his generalized anasarca.  7. Pleural effusion/pneumothorax: He continues to have residual effusion but asymptomatic at this time. Repeat thoracentesis can be used in the future as needed.  8. Prognosis: This was discussed with the patient and his friends and accompanied him today. His children are out of town and will communicate these findings to him as well based on his wishes. I believe he has limited life expectancy which is probably less than one year certainly less than that if he continues to decline. His performance status is declining which is worrisome and will continue to address his prognosis and goals of care moving forward.  9. Followup: In one month.        Zola Button, MD 12/8/20174:28 PM

## 2016-11-27 NOTE — Telephone Encounter (Signed)
Gave patient/relative avs report and appointments for January. Per patient/relative lab/fu scheduled for 1/3 - cannot come 1/2.

## 2016-11-30 ENCOUNTER — Other Ambulatory Visit: Payer: Self-pay | Admitting: Family Medicine

## 2016-11-30 DIAGNOSIS — I890 Lymphedema, not elsewhere classified: Secondary | ICD-10-CM | POA: Diagnosis not present

## 2016-11-30 DIAGNOSIS — L139 Bullous disorder, unspecified: Secondary | ICD-10-CM | POA: Diagnosis not present

## 2016-11-30 DIAGNOSIS — L97311 Non-pressure chronic ulcer of right ankle limited to breakdown of skin: Secondary | ICD-10-CM | POA: Diagnosis not present

## 2016-11-30 DIAGNOSIS — I10 Essential (primary) hypertension: Secondary | ICD-10-CM | POA: Diagnosis not present

## 2016-11-30 DIAGNOSIS — L97321 Non-pressure chronic ulcer of left ankle limited to breakdown of skin: Secondary | ICD-10-CM | POA: Diagnosis not present

## 2016-11-30 DIAGNOSIS — L97211 Non-pressure chronic ulcer of right calf limited to breakdown of skin: Secondary | ICD-10-CM | POA: Diagnosis not present

## 2016-11-30 NOTE — Telephone Encounter (Signed)
Received refill request electronically See office note 11/24/16 Looks like this medication was suppose to be stopped.

## 2016-12-01 ENCOUNTER — Ambulatory Visit (INDEPENDENT_AMBULATORY_CARE_PROVIDER_SITE_OTHER): Payer: Medicare Other | Admitting: Family Medicine

## 2016-12-01 ENCOUNTER — Encounter: Payer: Self-pay | Admitting: Family Medicine

## 2016-12-01 VITALS — BP 120/68 | HR 69 | Wt 132.5 lb

## 2016-12-01 DIAGNOSIS — J90 Pleural effusion, not elsewhere classified: Secondary | ICD-10-CM

## 2016-12-01 DIAGNOSIS — L98491 Non-pressure chronic ulcer of skin of other sites limited to breakdown of skin: Secondary | ICD-10-CM | POA: Diagnosis not present

## 2016-12-01 MED ORDER — FUROSEMIDE 20 MG PO TABS: 20.0000 mg | ORAL_TABLET | Freq: Every day | ORAL | 1 refills | Status: DC | PRN

## 2016-12-01 NOTE — Telephone Encounter (Signed)
Would stop med, stay off med in meantime.  Thanks.

## 2016-12-01 NOTE — Progress Notes (Signed)
Pre visit review using our clinic review tool, if applicable. No additional management support is needed unless otherwise documented below in the visit note. 

## 2016-12-01 NOTE — Patient Instructions (Signed)
Keep taking lasix, we'll work on getting the lung fluids drawn off, and keep the appointment with the wound center.

## 2016-12-01 NOTE — Progress Notes (Signed)
SOB again, with the feeling of a weight on his chest intermittently.  He gets SOB with exertion.  Still on lasix in the meantime, off HCTZ in the meantime.  Some dec in swelling the B ankles in the meantime.  Leg pain is some better in the meantime.   He has known left-sided pleural effusion, previously documented with CT chest. Previously drained.  He has seen the wound clinic in the meantime, both dressed already.  He has home tx pending for dressing changes.    Discussed with patient about his prognosis and potential/eventual transition over to hospice. He is considering his options. He is not ready to make this change yet. He has had this discussion also with oncology. I appreciate the help of all involved.  PMH and SH reviewed  ROS: Per HPI unless specifically indicated in ROS section   Meds, vitals, and allergies reviewed.   nad Thin appearing elderly male in wheelchair. Does not appear short of breath at rest. Neck supple, no lymphadenopathy Decreased breath sounds on the left hemithorax, normal breath sounds on the right. Heart is regular Abdomen soft Extremities without edema was noted on the bilateral lower legs. Larger area affected on the right lower leg and the left. He has some superficial ulceration. They do not appear infected. Redressed with nonstick bandage.

## 2016-12-01 NOTE — Discharge Instructions (Signed)
Thoracentesis, Care After Refer to this sheet in the next few weeks. These instructions provide you with information about caring for yourself after your procedure. Your health care provider may also give you more specific instructions. Your treatment has been planned according to current medical practices, but problems sometimes occur. Call your health care provider if you have any problems or questions after your procedure. What can I expect after the procedure? After your procedure, it is common to have pain at the puncture site. Follow these instructions at home:  Take medicines only as directed by your health care provider.  You may return to your normal diet and normal activities as directed by your health care provider.  Drink enough fluid to keep your urine clear or pale yellow.  Do not take baths, swim, or use a hot tub until your health care provider approves.  Follow your health care provider's instructions about:  Puncture site care.  Bandage (dressing) changes and removal.  Check your puncture site every day for signs of infection. Watch for:  Redness, swelling, or pain.  Fluid, blood, or pus.  Keep all follow-up visits as directed by your health care provider. This is important. Contact a health care provider if:  You have redness, swelling, or pain at your puncture site.  You have fluid, blood, or pus coming from your puncture site.  You have a fever.  You have chills.  You have nausea or vomiting.  You have trouble breathing.  You develop a worsening cough. Get help right away if:  You have extreme shortness of breath.  You develop chest pain.  You faint or feel light-headed. This information is not intended to replace advice given to you by your health care provider. Make sure you discuss any questions you have with your health care provider. Document Released: 12/28/2014 Document Revised: 08/08/2016 Document Reviewed: 09/18/2014 Elsevier  Interactive Patient Education  2017 [employer].

## 2016-12-02 ENCOUNTER — Ambulatory Visit
Admission: RE | Admit: 2016-12-02 | Discharge: 2016-12-02 | Disposition: A | Discharge status: Home / Self Care | Payer: Medicare Other | Source: Ambulatory Visit | Attending: Family Medicine | Admitting: Family Medicine

## 2016-12-02 ENCOUNTER — Ambulatory Visit
Admission: RE | Admit: 2016-12-02 | Discharge: 2016-12-02 | Disposition: A | Discharge status: Home / Self Care | Payer: Medicare Other | Source: Ambulatory Visit | Attending: Diagnostic Radiology | Admitting: Diagnostic Radiology

## 2016-12-02 DIAGNOSIS — Z9889 Other specified postprocedural states: Secondary | ICD-10-CM | POA: Diagnosis not present

## 2016-12-02 DIAGNOSIS — L97321 Non-pressure chronic ulcer of left ankle limited to breakdown of skin: Secondary | ICD-10-CM | POA: Diagnosis not present

## 2016-12-02 DIAGNOSIS — J90 Pleural effusion, not elsewhere classified: Secondary | ICD-10-CM | POA: Diagnosis not present

## 2016-12-02 DIAGNOSIS — L97211 Non-pressure chronic ulcer of right calf limited to breakdown of skin: Secondary | ICD-10-CM | POA: Diagnosis not present

## 2016-12-02 DIAGNOSIS — L139 Bullous disorder, unspecified: Secondary | ICD-10-CM | POA: Diagnosis not present

## 2016-12-02 DIAGNOSIS — L97311 Non-pressure chronic ulcer of right ankle limited to breakdown of skin: Secondary | ICD-10-CM | POA: Diagnosis not present

## 2016-12-02 DIAGNOSIS — J948 Other specified pleural conditions: Secondary | ICD-10-CM | POA: Diagnosis not present

## 2016-12-02 DIAGNOSIS — L98499 Non-pressure chronic ulcer of skin of other sites with unspecified severity: Secondary | ICD-10-CM | POA: Insufficient documentation

## 2016-12-02 DIAGNOSIS — I10 Essential (primary) hypertension: Secondary | ICD-10-CM | POA: Diagnosis not present

## 2016-12-02 DIAGNOSIS — I890 Lymphedema, not elsewhere classified: Secondary | ICD-10-CM | POA: Diagnosis not present

## 2016-12-02 NOTE — Assessment & Plan Note (Signed)
Redressed at office visit. Continue routine wound care. He has follow up with wound clinic pending.

## 2016-12-02 NOTE — Assessment & Plan Note (Signed)
We will set up repeat drainage of pleural effusion. See following orders. Discussed with patient. He agrees. I do not think we need to send the fluid off for analysis. He does not appear to have pneumonia clinically and he already has cancer diagnosis established. Continue Lasix daily for now. Stay off hydrochlorothiazide. Discussed with patient. He agrees.  We also talked about his goals of care, see above. He is considering his options. He has not yet ready to transition to hospice. >25 minutes spent in face to face time with patient, >50% spent in counselling or coordination of care

## 2016-12-02 NOTE — Procedures (Signed)
Left thoracentesis for 2 liters without difficulty  Complications:  None  Blood Loss: none  See dictation in canopy pacs

## 2016-12-03 ENCOUNTER — Telehealth: Payer: Self-pay

## 2016-12-03 ENCOUNTER — Encounter: Payer: Medicare Other | Admitting: Surgery

## 2016-12-03 DIAGNOSIS — L97321 Non-pressure chronic ulcer of left ankle limited to breakdown of skin: Secondary | ICD-10-CM | POA: Diagnosis not present

## 2016-12-03 DIAGNOSIS — I890 Lymphedema, not elsewhere classified: Secondary | ICD-10-CM | POA: Diagnosis not present

## 2016-12-03 DIAGNOSIS — L97211 Non-pressure chronic ulcer of right calf limited to breakdown of skin: Secondary | ICD-10-CM | POA: Diagnosis not present

## 2016-12-03 DIAGNOSIS — L97221 Non-pressure chronic ulcer of left calf limited to breakdown of skin: Secondary | ICD-10-CM | POA: Diagnosis not present

## 2016-12-03 NOTE — Telephone Encounter (Addendum)
Hunter Hardin left v/m that Dr Con Memos wanted Dr Damita Dunnings to call med for leg pain to [place] so pt could rest at night. I spoke with pt and advised would be tomorrow for cb and pt said OK.

## 2016-12-04 DIAGNOSIS — I10 Essential (primary) hypertension: Secondary | ICD-10-CM | POA: Diagnosis not present

## 2016-12-04 DIAGNOSIS — I890 Lymphedema, not elsewhere classified: Secondary | ICD-10-CM | POA: Diagnosis not present

## 2016-12-04 DIAGNOSIS — L139 Bullous disorder, unspecified: Secondary | ICD-10-CM | POA: Diagnosis not present

## 2016-12-04 DIAGNOSIS — L97211 Non-pressure chronic ulcer of right calf limited to breakdown of skin: Secondary | ICD-10-CM | POA: Diagnosis not present

## 2016-12-04 DIAGNOSIS — L97321 Non-pressure chronic ulcer of left ankle limited to breakdown of skin: Secondary | ICD-10-CM | POA: Diagnosis not present

## 2016-12-04 DIAGNOSIS — L97311 Non-pressure chronic ulcer of right ankle limited to breakdown of skin: Secondary | ICD-10-CM | POA: Diagnosis not present

## 2016-12-04 MED ORDER — TRAMADOL HCL 50 MG PO TABS: 50.0000 mg | ORAL_TABLET | Freq: Three times a day (TID) | ORAL | 1 refills | Status: DC | PRN

## 2016-12-04 NOTE — Telephone Encounter (Signed)
Phyllis left v/m requesting cb ASAP due to pt not being able to sleep due to leg pain.

## 2016-12-04 NOTE — Telephone Encounter (Signed)
Would try tramadol. If not effective, then let me know.  Please call in.   I talked with Dr. Con Memos this AM.  Thanks.

## 2016-12-04 NOTE — Telephone Encounter (Signed)
Spoke to pt because Hunter Hardin was gone. He appreciated the medication. They will let us know if it does not help

## 2016-12-04 NOTE — Progress Notes (Signed)
Hunter Hardin, Hunter Hardin ([id number]) Visit Report for 12/03/2016 Arrival Information Details Patient Name: Hunter Hardin, Hunter Hardin. Date of Service: 12/03/2016 9:15 AM Medical Record Number: [id number] Patient Account Number: [id number] Date of Birth/Sex: 1935/01/27 (81 y.o. Male) Treating RN: Ahmed Prima Primary Care Physician: Elsie Stain Other Clinician: Referring Physician: Elsie Stain Treating Physician/Extender: Frann Rider in Treatment: 1 Visit Information History Since Last Visit All ordered tests and consults were completed: No Patient Arrived: Wheel Chair Added or deleted any medications: No Arrival Time: 09:21 Any new allergies or adverse reactions: No Accompanied By: cousin Had a fall or experienced change in No Transfer Assistance: [employer] activities of daily living that may affect Patient Lift risk of falls: Patient Identification Verified: Yes Signs or symptoms of abuse/neglect since last No Secondary Verification Process Yes visito Completed: Hospitalized since last visit: No Patient Requires Transmission- No Has Dressing in Place as Prescribed: Yes Based Precautions: Has Compression in Place as Prescribed: Yes Patient Has Alerts: Yes Pain Present Now: No Electronic Signature(s) Signed: 12/03/2016 4:30:33 PM By: Alric Quan Entered By: Alric Quan on 12/03/2016 09:27:13 Hunter Hardin ([id number]) -------------------------------------------------------------------------------- Clinic Level of Care Assessment Details Patient Name: Hunter Hardin. Date of Service: 12/03/2016 9:15 AM Medical Record Number: [id number] Patient Account Number: [id number] Date of Birth/Sex: [birth year] (81 y.o. Male) Treating RN: Ahmed Prima Primary Care Physician: Elsie Stain Other Clinician: Referring Physician: Elsie Stain Treating Physician/Extender: Frann Rider in Treatment: 1 Clinic Level of Care Assessment Items TOOL 4 Quantity  Score X - Use when only an EandM is performed on FOLLOW-UP visit 1 0 ASSESSMENTS - Nursing Assessment / Reassessment X - Reassessment of Co-morbidities (includes updates in patient status) 1 10 X - Reassessment of Adherence to Treatment Plan 1 5 ASSESSMENTS - Wound and Skin Assessment / Reassessment '[]'$  - Simple Wound Assessment / Reassessment - one wound 0 X - Complex Wound Assessment / Reassessment - multiple wounds 3 5 '[]'$  - Dermatologic / Skin Assessment (not related to wound area) 0 ASSESSMENTS - Focused Assessment X - Circumferential Edema Measurements - multi extremities 3 5 '[]'$  - Nutritional Assessment / Counseling / Intervention 0 '[]'$  - Lower Extremity Assessment (monofilament, tuning fork, pulses) 0 '[]'$  - Peripheral Arterial Disease Assessment (using hand held doppler) 0 ASSESSMENTS - Ostomy and/or Continence Assessment and Care '[]'$  - Incontinence Assessment and Management 0 '[]'$  - Ostomy Care Assessment and Management (repouching, etc.) 0 PROCESS - Coordination of Care '[]'$  - Simple Patient / Family Education for ongoing care 0 X - Complex (extensive) Patient / Family Education for ongoing care 1 20 X - Staff obtains Programmer, systems, Records, Test Results / Process Orders 1 10 X - Staff telephones HHA, Nursing Homes / Clarify orders / etc 1 10 '[]'$  - Routine Transfer to another Facility (non-emergent condition) 0 Hunter Hardin, Hunter Hardin ([id number]) '[]'$  - Routine Hospital Admission (non-emergent condition) 0 '[]'$  - New Admissions / Biomedical engineer / Ordering NPWT, Apligraf, etc. 0 '[]'$  - Emergency Hospital Admission (emergent condition) 0 X - Simple Discharge Coordination 1 10 '[]'$  - Complex (extensive) Discharge Coordination 0 PROCESS - Special Needs '[]'$  - Pediatric / Minor Patient Management 0 '[]'$  - Isolation Patient Management 0 '[]'$  - Hearing / Language / Visual special needs 0 '[]'$  - Assessment of Community assistance (transportation, D/C planning, etc.) 0 '[]'$  - Additional assistance / Altered  mentation 0 '[]'$  - Support Surface(s) Assessment (bed, cushion, seat, etc.) 0 INTERVENTIONS - Wound Cleansing / Measurement '[]'$  - Simple Wound Cleansing - one wound 0 X -  Complex Wound Cleansing - multiple wounds 2 5 X - Wound Imaging (photographs - any number of wounds) 1 5 '[]'$  - Wound Tracing (instead of photographs) 0 X - Simple Wound Measurement - one wound 1 5 '[]'$  - Complex Wound Measurement - multiple wounds 0 INTERVENTIONS - Wound Dressings '[]'$  - Small Wound Dressing one or multiple wounds 0 '[]'$  - Medium Wound Dressing one or multiple wounds 0 X - Large Wound Dressing one or multiple wounds 2 20 X - Application of Medications - topical 1 5 '[]'$  - Application of Medications - injection 0 INTERVENTIONS - Miscellaneous '[]'$  - External ear exam 0 Hunter Hardin, Hunter Hardin ([id number]) '[]'$  - Specimen Collection (cultures, biopsies, blood, body fluids, etc.) 0 '[]'$  - Specimen(s) / Culture(s) sent or taken to Lab for analysis 0 '[]'$  - Patient Transfer (multiple staff / Harrel Lemon Lift / Similar devices) 0 '[]'$  - Simple Staple / Suture removal (25 or less) 0 '[]'$  - Complex Staple / Suture removal (26 or more) 0 '[]'$  - Hypo / Hyperglycemic Management (close monitor of Blood Glucose) 0 '[]'$  - Ankle / Brachial Index (ABI) - do not check if billed separately 0 X - Vital Signs 1 5 Has the patient been seen at the hospital within the last three years: Yes Total Score: 165 Level Of Care: New/Established - Level 5 Electronic Signature(s) Signed: 12/03/2016 4:30:33 PM By: Alric Quan Entered By: Alric Quan on 12/03/2016 12:12:04 Hunter Hardin ([id number]) -------------------------------------------------------------------------------- Encounter Discharge Information Details Patient Name: Hunter Hardin. Date of Service: 12/03/2016 9:15 AM Medical Record Number: [id number] Patient Account Number: [id number] Date of Birth/Sex: 24-Feb-1935 (81 y.o. Male) Treating RN: Ahmed Prima Primary Care Physician:  Elsie Stain Other Clinician: Referring Physician: Elsie Stain Treating Physician/Extender: Frann Rider in Treatment: 1 Encounter Discharge Information Items Discharge Pain Level: 4 Discharge Condition: Stable Ambulatory Status: Wheelchair Discharge Destination: Home Transportation: Private Auto Accompanied By: cousin Schedule Follow-up Appointment: Yes Medication Reconciliation completed and provided to Patient/Care Yes Ninah Moccio: Provided on Clinical Summary of Care: 12/03/2016 Form Type Recipient Paper Patient JS Electronic Signature(s) Signed: 12/03/2016 10:41:05 AM By: Ruthine Dose Entered By: Ruthine Dose on 12/03/2016 10:41:05 Hunter Hardin ([id number]) -------------------------------------------------------------------------------- Lower Extremity Assessment Details Patient Name: Hunter Hardin Date of Service: 12/03/2016 9:15 AM Medical Record Number: [id number] Patient Account Number: [id number] Date of Birth/Sex: [birth year] (81 y.o. Male) Treating RN: Ahmed Prima Primary Care Physician: Elsie Stain Other Clinician: Referring Physician: Elsie Stain Treating Physician/Extender: Frann Rider in Treatment: 1 Edema Assessment Assessed: [Left: No] [Right: No] E[Left: dema] [Right: :] Calf Left: Right: Point of Measurement: 33 cm From Medial Instep 31 cm 29.7 cm Ankle Left: Right: Point of Measurement: 10 cm From Medial Instep 23.5 cm 23.5 cm Vascular Assessment Pulses: Posterior Tibial Dorsalis Pedis Palpable: [Left:Yes] [Right:Yes] Extremity colors, hair growth, and conditions: Extremity Color: [Left:Red] [Right:Red] Temperature of Extremity: [Left:Warm] [Right:Warm] Capillary Refill: [Left:< 3 seconds] [Right:< 3 seconds] Toe Nail Assessment Left: Right: Thick: Yes Yes Discolored: Yes Yes Deformed: No No Improper Length and Hygiene: No No Electronic Signature(s) Signed: 12/03/2016 4:30:33 PM By: Alric Quan Entered By: Alric Quan on 12/03/2016 09:52:55 Hunter Hardin ([id number]) -------------------------------------------------------------------------------- Multi Wound Chart Details Patient Name: Hunter Hardin. Date of Service: 12/03/2016 9:15 AM Medical Record Number: [id number] Patient Account Number: [id number] Date of Birth/Sex: 20-Aug-1935 (81 y.o. Male) Treating RN: Ahmed Prima Primary Care Physician: Elsie Stain Other Clinician: Referring Physician: Elsie Stain Treating Physician/Extender: Frann Rider in Treatment: 1 Vital Signs Height(in): 54 Pulse(bpm):  66 Weight(lbs): 138 Blood Pressure 139/75 (mmHg): Body Mass Index(BMI): 22 Temperature(F): Respiratory Rate 18 (breaths/min): Photos: [1:No Photos] [2:No Photos] [3:No Photos] Wound Location: [1:Left Lower Leg - Circumfernential] [2:Left Malleolus - Medial] [3:Left Malleolus - Lateral] Wounding Event: [1:Gradually Appeared] [2:Gradually Appeared] [3:Gradually Appeared] Primary Etiology: [1:Venous Leg Ulcer] [2:Venous Leg Ulcer] [3:Venous Leg Ulcer] Comorbid History: [1:Osteoarthritis, Received Chemotherapy] [2:Osteoarthritis, Received Chemotherapy] [3:Osteoarthritis, Received Chemotherapy] Date Acquired: [1:11/09/2016] [2:11/09/2016] [3:11/09/2016] Weeks of Treatment: [1:1] [2:1] [3:1] Wound Status: [1:Open] [2:Open] [3:Open] Clustered Wound: [1:Yes] [2:Yes] [3:No] Measurements L x W x D 19x21.3x0.1 [2:1.8x2.7x0.1] [3:1.7x0.5x0.1] (cm) Area (cm) : [1:317.851] [2:3.817] [3:0.668] Volume (cm) : [1:31.785] [2:0.382] [3:0.067] % Reduction in Area: [1:-118.50%] [2:15.90%] [3:74.70%] % Reduction in Volume: -118.50% [2:15.90%] [3:74.60%] Classification: [1:Partial Thickness] [2:Partial Thickness] [3:Partial Thickness] Exudate Amount: [1:Large] [2:Large] [3:Large] Exudate Type: [1:Serous] [2:Serous] [3:Serous] Exudate Color: [1:amber] [2:amber] [3:amber] Wound Margin: [1:Flat and  Intact] [2:Flat and Intact] [3:Flat and Intact] Granulation Amount: [1:Medium (34-66%)] [2:None Present (0%)] [3:Medium (34-66%)] Granulation Quality: [1:Red] [2:N/A] [3:Red] Necrotic Amount: [1:Medium (34-66%)] [2:Large (67-100%)] [3:Medium (34-66%)] Exposed Structures: [1:Fascia: No Fat: No Tendon: No Muscle: No Joint: No] [2:Fascia: No Fat: No Tendon: No Muscle: No Joint: No] [3:Fascia: No Fat: No Tendon: No Muscle: No Joint: No] Bone: No Bone: No Bone: No Limited to Skin Limited to Skin Limited to Skin Breakdown Breakdown Breakdown Epithelialization: Small (1-33%) None None Periwound Skin Texture: Edema: No Edema: No Edema: No Excoriation: No Excoriation: No Excoriation: No Induration: No Induration: No Induration: No Callus: No Callus: No Callus: No Crepitus: No Crepitus: No Crepitus: No Fluctuance: No Fluctuance: No Fluctuance: No Friable: No Friable: No Friable: No Rash: No Rash: No Rash: No Scarring: No Scarring: No Scarring: No Periwound Skin Maceration: Yes Moist: Yes Moist: Yes Moisture: Moist: Yes Maceration: No Maceration: No Dry/Scaly: No Dry/Scaly: No Dry/Scaly: No Periwound Skin Color: Erythema: Yes Erythema: Yes Erythema: Yes Atrophie Blanche: No Atrophie Blanche: No Atrophie Blanche: No Cyanosis: No Cyanosis: No Cyanosis: No Ecchymosis: No Ecchymosis: No Ecchymosis: No Hemosiderin Staining: No Hemosiderin Staining: No Hemosiderin Staining: No Mottled: No Mottled: No Mottled: No Pallor: No Pallor: No Pallor: No Rubor: No Rubor: No Rubor: No Erythema Location: Circumferential Circumferential Circumferential Temperature: No Abnormality No Abnormality No Abnormality Tenderness on Yes Yes Yes Palpation: Wound Preparation: Ulcer Cleansing: Ulcer Cleansing: Ulcer Cleansing: Rinsed/Irrigated with Rinsed/Irrigated with Rinsed/Irrigated with Saline Saline Saline Topical Anesthetic Topical Anesthetic Topical Anesthetic Applied:  Other: lidocaine Applied: Other: lidocaine Applied: Other: lidocaine 4% 4% 4% Treatment Notes Electronic Signature(s) Signed: 12/03/2016 4:30:33 PM By: Alric Quan Entered By: Alric Quan on 12/03/2016 10:05:07 Hunter Hardin ([id number]) -------------------------------------------------------------------------------- [place] Details Patient Name: Hunter Hardin, Hunter Hardin. Date of Service: 12/03/2016 9:15 AM Medical Record Number: [id number] Patient Account Number: [id number] Date of Birth/Sex: 27-Jul-1935 (81 y.o. Male) Treating RN: Ahmed Prima Primary Care Physician: Elsie Stain Other Clinician: Referring Physician: Elsie Stain Treating Physician/Extender: Frann Rider in Treatment: 1 Active Inactive Abuse / Safety / Falls / Self Care Management Nursing Diagnoses: Impaired physical mobility Potential for falls Goals: Patient will remain injury free Date Initiated: 11/26/2016 Goal Status: Active Interventions: Assess fall risk on admission and as needed Notes: Nutrition Nursing Diagnoses: Potential for alteratiion in Nutrition/Potential for imbalanced nutrition Goals: Patient/caregiver agrees to and verbalizes understanding of need to use nutritional supplements and/or vitamins as prescribed Date Initiated: 11/26/2016 Goal Status: Active Interventions: Assess patient nutrition upon admission and as needed per policy Notes: Orientation to the Wound Care Program Nursing Diagnoses: Knowledge deficit related to  the wound healing center program Goals: Hunter Hardin, Hunter Hardin ([id number]) Patient/caregiver will verbalize understanding of the [place] Program Date Initiated: 11/26/2016 Goal Status: Active Interventions: Provide education on orientation to the wound center Notes: Venous Leg Ulcer Nursing Diagnoses: Potential for venous Insuffiency (use before diagnosis confirmed) Goals: Non-invasive venous studies are  completed as ordered Date Initiated: 11/26/2016 Goal Status: Active Patient will maintain optimal edema control Date Initiated: 11/26/2016 Goal Status: Active Interventions: Assess peripheral edema status every visit. Notes: Wound/Skin Impairment Nursing Diagnoses: Impaired tissue integrity Goals: Patient/caregiver will verbalize understanding of skin care regimen Date Initiated: 11/26/2016 Goal Status: Active Ulcer/skin breakdown will have a volume reduction of 30% by week 4 Date Initiated: 11/26/2016 Goal Status: Active Ulcer/skin breakdown will have a volume reduction of 50% by week 8 Date Initiated: 11/26/2016 Goal Status: Active Ulcer/skin breakdown will have a volume reduction of 80% by week 12 Date Initiated: 11/26/2016 Goal Status: Active Ulcer/skin breakdown will heal within 14 weeks Date Initiated: 11/26/2016 Hunter Hardin, Hunter Hardin ([id number]) Goal Status: Active Interventions: Assess patient/caregiver ability to obtain necessary supplies Assess patient/caregiver ability to perform ulcer/skin care regimen upon admission and as needed Assess ulceration(s) every visit Notes: Electronic Signature(s) Signed: 12/03/2016 4:30:33 PM By: Alric Quan Entered By: Alric Quan on 12/03/2016 10:04:56 Hunter Hardin ([id number]) -------------------------------------------------------------------------------- Pain Assessment Details Patient Name: Hunter Hardin. Date of Service: 12/03/2016 9:15 AM Medical Record Number: [id number] Patient Account Number: [id number] Date of Birth/Sex: [birth year] (81 y.o. Male) Treating RN: Ahmed Prima Primary Care Physician: Elsie Stain Other Clinician: Referring Physician: Elsie Stain Treating Physician/Extender: Frann Rider in Treatment: 1 Active Problems Location of Pain Severity and Description of Pain Patient Has Paino No Site Locations With Dressing Change: No Pain Management and Medication Current Pain  Management: Electronic Signature(s) Signed: 12/03/2016 4:30:33 PM By: Alric Quan Entered By: Alric Quan on 12/03/2016 09:27:21 Hunter Hardin ([id number]) -------------------------------------------------------------------------------- Patient/Caregiver Education Details Patient Name: Hunter Hardin Date of Service: 12/03/2016 9:15 AM Medical Record Number: [id number] Patient Account Number: [id number] Date of Birth/Gender: June 12, 1935 (81 y.o. Male) Treating RN: Ahmed Prima Primary Care Physician: Elsie Stain Other Clinician: Referring Physician: Elsie Stain Treating Physician/Extender: Frann Rider in Treatment: 1 Education Assessment Education Provided To: Patient Education Topics Provided Wound/Skin Impairment: Handouts: Other: change dressing as ordered Methods: Demonstration, Explain/Verbal Responses: State content correctly Electronic Signature(s) Signed: 12/03/2016 4:30:33 PM By: Alric Quan Entered By: Alric Quan on 12/03/2016 10:12:28 Hunter Hardin ([id number]) -------------------------------------------------------------------------------- Wound Assessment Details Patient Name: Hunter Hardin. Date of Service: 12/03/2016 9:15 AM Medical Record Number: [id number] Patient Account Number: [id number] Date of Birth/Sex: 08/23/1935 (81 y.o. Male) Treating RN: Ahmed Prima Primary Care Physician: Elsie Stain Other Clinician: Referring Physician: Elsie Stain Treating Physician/Extender: Frann Rider in Treatment: 1 Wound Status Wound Number: 1 Primary Venous Leg Ulcer Etiology: Wound Location: Left Lower Leg - Circumfernential Wound Status: Open Wounding Event: Gradually Appeared Comorbid Osteoarthritis, Received History: Chemotherapy Date Acquired: 11/09/2016 Weeks Of Treatment: 1 Clustered Wound: Yes Photos Photo Uploaded By: Alric Quan on 12/03/2016 13:01:28 Wound Measurements Length:  (cm) 19 Width: (cm) 21.3 Depth: (cm) 0.1 Area: (cm) 317.851 Volume: (cm) 31.785 % Reduction in Area: -118.5% % Reduction in Volume: -118.5% Epithelialization: Small (1-33%) Tunneling: No Undermining: No Wound Description Classification: Partial Thickness Foul Odor Afte Wound Margin: Flat and Intact Exudate Amount: Large Exudate Type: Serous Exudate Color: amber r Cleansing: No Wound Bed Granulation Amount: Medium (34-66%) Exposed Structure Granulation Quality: Red Fascia Exposed: No Necrotic Amount: Medium (34-66%)  Fat Layer Exposed: No Hunter Hardin, Hunter Hardin ([id number]) Necrotic Quality: Adherent Slough Tendon Exposed: No Muscle Exposed: No Joint Exposed: No Bone Exposed: No Limited to Skin Breakdown Periwound Skin Texture Texture Color No Abnormalities Noted: No No Abnormalities Noted: No Callus: No Atrophie Blanche: No Crepitus: No Cyanosis: No Excoriation: No Ecchymosis: No Fluctuance: No Erythema: Yes Friable: No Erythema Location: Circumferential Induration: No Hemosiderin Staining: No Localized Edema: No Mottled: No Rash: No Pallor: No Scarring: No Rubor: No Moisture Temperature / Pain No Abnormalities Noted: No Temperature: No Abnormality Dry / Scaly: No Tenderness on Palpation: Yes Maceration: Yes Moist: Yes Wound Preparation Ulcer Cleansing: Rinsed/Irrigated with Saline Topical Anesthetic Applied: Other: lidocaine 4%, Electronic Signature(s) Signed: 12/03/2016 4:30:33 PM By: Alric Quan Entered By: Alric Quan on 12/03/2016 09:55:17 Hunter Hardin ([id number]) -------------------------------------------------------------------------------- Wound Assessment Details Patient Name: Hunter Hardin Date of Service: 12/03/2016 9:15 AM Medical Record Number: [id number] Patient Account Number: [id number] Date of Birth/Sex: 1935/08/10 (81 y.o. Male) Treating RN: Ahmed Prima Primary Care Physician: Elsie Stain Other  Clinician: Referring Physician: Elsie Stain Treating Physician/Extender: Frann Rider in Treatment: 1 Wound Status Wound Number: 2 Primary Venous Leg Ulcer Etiology: Wound Location: Left Malleolus - Medial Wound Status: Open Wounding Event: Gradually Appeared Comorbid Osteoarthritis, Received Date Acquired: 11/09/2016 History: Chemotherapy Weeks Of Treatment: 1 Clustered Wound: Yes Photos Photo Uploaded By: Alric Quan on 12/03/2016 13:01:29 Wound Measurements Length: (cm) 1.8 Width: (cm) 2.7 Depth: (cm) 0.1 Area: (cm) 3.817 Volume: (cm) 0.382 % Reduction in Area: 15.9% % Reduction in Volume: 15.9% Epithelialization: None Tunneling: No Undermining: No Wound Description Classification: Partial Thickness Foul Odor Afte Wound Margin: Flat and Intact Exudate Amount: Large Exudate Type: Serous Exudate Color: amber r Cleansing: No Wound Bed Granulation Amount: None Present (0%) Exposed Structure Necrotic Amount: Large (67-100%) Fascia Exposed: No Necrotic Quality: Adherent Slough Fat Layer Exposed: No Tendon Exposed: No Hunter Hardin, Hunter Hardin. ([id number]) Muscle Exposed: No Joint Exposed: No Bone Exposed: No Limited to Skin Breakdown Periwound Skin Texture Texture Color No Abnormalities Noted: No No Abnormalities Noted: No Callus: No Atrophie Blanche: No Crepitus: No Cyanosis: No Excoriation: No Ecchymosis: No Fluctuance: No Erythema: Yes Friable: No Erythema Location: Circumferential Induration: No Hemosiderin Staining: No Localized Edema: No Mottled: No Rash: No Pallor: No Scarring: No Rubor: No Moisture Temperature / Pain No Abnormalities Noted: No Temperature: No Abnormality Dry / Scaly: No Tenderness on Palpation: Yes Maceration: No Moist: Yes Wound Preparation Ulcer Cleansing: Rinsed/Irrigated with Saline Topical Anesthetic Applied: Other: lidocaine 4%, Treatment Notes Wound #2 (Left, Medial Malleolus) 1. Cleansed  with: Clean wound with Normal Saline Cleanse wound with antibacterial soap and water 2. Anesthetic Topical Lidocaine 4% cream to wound bed prior to debridement 4. Dressing Applied: Foam Other dressing (specify in notes) Notes kerlix, coban, contact layer- non-adherent strips oil emulsion Electronic Signature(s) Signed: 12/03/2016 4:30:33 PM By: Alric Quan Entered By: Alric Quan on 12/03/2016 09:57:10 Hunter Hardin ([id number]) -------------------------------------------------------------------------------- Wound Assessment Details Patient Name: Hunter Hardin. Date of Service: 12/03/2016 9:15 AM Medical Record Number: [id number] Patient Account Number: [id number] Date of Birth/Sex: [birth year] (81 y.o. Male) Treating RN: Ahmed Prima Primary Care Physician: Elsie Stain Other Clinician: Referring Physician: Elsie Stain Treating Physician/Extender: Frann Rider in Treatment: 1 Wound Status Wound Number: 3 Primary Venous Leg Ulcer Etiology: Wound Location: Left Malleolus - Lateral Wound Status: Open Wounding Event: Gradually Appeared Comorbid Osteoarthritis, Received Date Acquired: 11/09/2016 History: Chemotherapy Weeks Of Treatment: 1 Clustered Wound: No Photos Photo Uploaded By: Alric Quan on  12/03/2016 13:01:46 Wound Measurements Length: (cm) 1.7 Width: (cm) 0.5 Depth: (cm) 0.1 Area: (cm) 0.668 Volume: (cm) 0.067 % Reduction in Area: 74.7% % Reduction in Volume: 74.6% Epithelialization: None Tunneling: No Undermining: No Wound Description Classification: Partial Thickness Foul Odor Afte Wound Margin: Flat and Intact Exudate Amount: Large Exudate Type: Serous Exudate Color: amber r Cleansing: No Wound Bed Granulation Amount: Medium (34-66%) Exposed Structure Granulation Quality: Red Fascia Exposed: No Necrotic Amount: Medium (34-66%) Fat Layer Exposed: No Necrotic Quality: Adherent Slough Tendon Exposed:  No Hunter Hardin, Hunter Hardin ([id number]) Muscle Exposed: No Joint Exposed: No Bone Exposed: No Limited to Skin Breakdown Periwound Skin Texture Texture Color No Abnormalities Noted: No No Abnormalities Noted: No Callus: No Atrophie Blanche: No Crepitus: No Cyanosis: No Excoriation: No Ecchymosis: No Fluctuance: No Erythema: Yes Friable: No Erythema Location: Circumferential Induration: No Hemosiderin Staining: No Localized Edema: No Mottled: No Rash: No Pallor: No Scarring: No Rubor: No Moisture Temperature / Pain No Abnormalities Noted: No Temperature: No Abnormality Dry / Scaly: No Tenderness on Palpation: Yes Maceration: No Moist: Yes Wound Preparation Ulcer Cleansing: Rinsed/Irrigated with Saline Topical Anesthetic Applied: Other: lidocaine 4%, Treatment Notes Wound #3 (Left, Lateral Malleolus) 1. Cleansed with: Clean wound with Normal Saline Cleanse wound with antibacterial soap and water 2. Anesthetic Topical Lidocaine 4% cream to wound bed prior to debridement 4. Dressing Applied: Foam Other dressing (specify in notes) Notes kerlix, coban, contact layer- non-adherent strips oil emulsion Electronic Signature(s) Signed: 12/03/2016 4:30:33 PM By: Alric Quan Entered By: Alric Quan on 12/03/2016 09:58:20 Hunter Hardin ([id number]) -------------------------------------------------------------------------------- Vitals Details Patient Name: Hunter Hardin. Date of Service: 12/03/2016 9:15 AM Medical Record Number: [id number] Patient Account Number: [id number] Date of Birth/Sex: [birth year] (81 y.o. Male) Treating RN: Ahmed Prima Primary Care Physician: Elsie Stain Other Clinician: Referring Physician: Elsie Stain Treating Physician/Extender: Frann Rider in Treatment: 1 Vital Signs Time Taken: 09:27 Pulse (bpm): 66 Height (in): 67 Respiratory Rate (breaths/min): 18 Weight (lbs): 138 Blood Pressure (mmHg): 139/75 Body  Mass Index (BMI): 21.6 Reference Range: 80 - 120 mg / dl Electronic Signature(s) Signed: 12/03/2016 4:30:33 PM By: Alric Quan Entered By: Alric Quan on 12/03/2016 09:27:43

## 2016-12-04 NOTE — Telephone Encounter (Signed)
Dr. Con Memos- can I get your input on this?  Thanks.

## 2016-12-05 NOTE — Progress Notes (Signed)
TYLEEK, SMICK ([id number]) Visit Report for 12/03/2016 Chief Complaint Document Details Patient Name: Hunter Hardin, Hunter Hardin. Date of Service: 12/03/2016 9:15 AM Medical Record Number: [id number] Patient Account Number: [id number] Date of Birth/Sex: 24-Mar-1935 (81 y.o. Male) Treating RN: Ahmed Prima Primary Care Physician: Elsie Stain Other Clinician: Referring Physician: Elsie Stain Treating Physician/Extender: Frann Rider in Treatment: 1 Information Obtained from: Patient Chief Complaint Patient presents to the wound care center for a consult due non healing wound to both lower extremities which she's had for about 3 weeks now Electronic Signature(s) Signed: 12/03/2016 10:19:49 AM By: Christin Fudge MD, FACS Entered By: Christin Fudge on 12/03/2016 10:19:48 Hunter Hardin ([id number]) -------------------------------------------------------------------------------- HPI Details Patient Name: Hunter Hardin Date of Service: 12/03/2016 9:15 AM Medical Record Number: [id number] Patient Account Number: [id number] Date of Birth/Sex: 04/11/1935 (81 y.o. Male) Treating RN: Ahmed Prima Primary Care Physician: Elsie Stain Other Clinician: Referring Physician: Elsie Stain Treating Physician/Extender: Frann Rider in Treatment: 1 History of Present Illness Location: both lower extremities right worse than left Quality: Patient reports experiencing a dull pain to affected area(s). Severity: Patient states wound are getting worse. Duration: Patient has had the wound for < 3 weeks prior to presenting for treatment Timing: Pain in wound is Intermittent (comes and goes Context: The wound would happen gradually Modifying Factors: Other treatment(s) tried include:local ointments and local care Associated Signs and Symptoms: Patient reports having increase swelling. 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 Past medical history significant for essential hypertension, pleural effusion, colon cancer, hypothyroidism, renal cell cancer, hyperglycemia, thrombocytopenia, protein calorie malnutrition. the patient lives alone and has very little support and has a family friend who is helping him with his appointments. He has not seen a dermatologist for this condition recently. 12/03/2016 -- he continues to have a lot of pain and has not yet seen a dermatologist. Electronic Signature(s) Signed: 12/03/2016 10:20:13 AM By: Christin Fudge MD, FACS Entered By: Christin Fudge on 12/03/2016 10:20:13 Hunter Hardin ([id number]) -------------------------------------------------------------------------------- Physical Exam Details Patient Name: Hunter Hardin Date of Service: 12/03/2016 9:15 AM Medical Record Number: [id number] Patient Account Number: [id number] Date of Birth/Sex: 05/15/1935 (81 y.o. Male) Treating RN: Ahmed Prima Primary Care Physician: Elsie Stain Other Clinician: Referring Physician: Elsie Stain Treating Physician/Extender: Frann Rider in Treatment: 1 Constitutional . Pulse regular. Respirations normal and unlabored. Afebrile. . Eyes Nonicteric. Reactive to light. Ears, Nose, Mouth, and Throat Lips, teeth, and gums WNL.[name] Moist mucosa without lesions. Neck supple and nontender. No palpable supraclavicular or cervical adenopathy. Normal sized without goiter. Respiratory WNL. No retractions.. Cardiovascular Pedal Pulses WNL. No clubbing, cyanosis or edema. Lymphatic No adneopathy. No adenopathy. No adenopathy. Musculoskeletal Adexa without tenderness or enlargement.. Digits and nails w/o clubbing, cyanosis, infection, petechiae, ischemia, or inflammatory  conditions.. Integumentary (Hair, Skin) No suspicious lesions. No crepitus or fluctuance. No peri-wound warmth or erythema. No masses.[name] Psychiatric Judgement and insight Intact.. No evidence of depression, anxiety, or agitation.. Notes lymphedema is much better than the ulcerations all look cleaner and have no surrounding cellulitis. No sharp debridement was required today. Electronic Signature(s) Signed: 12/03/2016 10:20:43 AM By: Christin Fudge MD, FACS Entered By: Christin Fudge on 12/03/2016 10:20:43 Hunter Hardin ([id number]) -------------------------------------------------------------------------------- Physician Orders Details Patient Name: Hunter Hardin, Hunter Hardin. Date of  Service: 12/03/2016 9:15 AM Medical Record Number: [id number] Patient Account Number: [id number] Date of Birth/Sex: 1935/01/03 (81 y.o. Male) Treating RN: Ahmed Prima Primary Care Physician: Elsie Stain Other Clinician: Referring Physician: Elsie Stain Treating Physician/Extender: Frann Rider in Treatment: 1 Verbal / Phone Orders: Yes Clinician: Pinkerton, Debi Read Back and Verified: Yes Diagnosis Coding Wound Cleansing Wound #1 Right,Circumferential Lower Leg o Clean wound with Normal Saline. o Cleanse wound with mild soap and water Wound #2 Left,Medial Malleolus o Clean wound with Normal Saline. o Cleanse wound with mild soap and water Wound #3 Left,Lateral Malleolus o Clean wound with Normal Saline. o Cleanse wound with mild soap and water Anesthetic Wound #1 Right,Circumferential Lower Leg o Topical Lidocaine 4% cream applied to wound bed prior to debridement - clinic use Wound #2 Left,Medial Malleolus o Topical Lidocaine 4% cream applied to wound bed prior to debridement - clinic use Wound #3 Left,Lateral Malleolus o Topical Lidocaine 4% cream applied to wound bed prior to debridement - clinic use Primary Wound Dressing Wound #1 Right,Circumferential Lower  Leg o Foam - non adherent foam o Other: - contact layer- non-adherent strips oil emulsion (pt prefers) or adaptic Wound #2 Left,Medial Malleolus o Foam - non adherent foam o Other: - contact layer- non-adherent strips oil emulsion (pt prefers) or adaptic Wound #3 Left,Lateral Malleolus o Foam - non adherent foam Hunter Hardin, Hunter Hardin ([id number]) o Other: - contact layer- non-adherent strips oil emulsion (pt prefers) or adaptic Secondary Dressing Wound #1 Right,Circumferential Lower Leg o ABD pad - if needed for drainage Wound #2 Left,Medial Malleolus o ABD pad - if needed for drainage Wound #3 Left,Lateral Malleolus o ABD pad - if needed for drainage Dressing Change Frequency Wound #1 Right,Circumferential Lower Leg o Dressing is to be changed Monday and Thursday. Wound #2 Left,Medial Malleolus o Dressing is to be changed Monday and Thursday. Wound #3 Left,Lateral Malleolus o Dressing is to be changed Monday and Thursday. Follow-up Appointments Wound #1 Right,Circumferential Lower Leg o Return Appointment in 1 week. Wound #2 Left,Medial Malleolus o Return Appointment in 1 week. Wound #3 Left,Lateral Malleolus o Return Appointment in 1 week. Edema Control Wound #1 Right,Circumferential Lower Leg o Kerlix and Coban - Bilateral - lightly Wound #2 Left,Medial Malleolus o Kerlix and Coban - Bilateral - lightly Wound #3 Left,Lateral Malleolus o Kerlix and Coban - Bilateral - lightly Additional Orders / Instructions Wound #1 Right,Circumferential Lower Leg o Increase protein intake. Hunter Hardin, Hunter Hardin ([id number]) Wound #2 Left,Medial Malleolus o Increase protein intake. Wound #3 Left,Lateral Malleolus o Increase protein intake. Home Health Wound #1 Right,Circumferential Lower Leg o Continue Home Health Visits - Evaluate for any therapy needs patient has and get that order from patient's PCP Dr Elsie Stain o Home Health Nurse  may visit PRN to address patientos wound care needs. o FACE TO FACE ENCOUNTER: MEDICARE and MEDICAID PATIENTS: I certify that this patient is under my care and that I had a face-to-face encounter that meets the physician face-to-face encounter requirements with this patient on this date. The encounter with the patient was in whole or in part for the following MEDICAL CONDITION: (primary reason for [place]) MEDICAL NECESSITY: I certify, that based on my findings, NURSING services are a medically necessary home health service. HOME BOUND STATUS: I certify that my clinical findings support that this patient is homebound (i.e., Due to illness or injury, pt requires aid of supportive devices such as crutches, cane, wheelchairs, walkers, the use of special transportation or the assistance  of another person to leave their place of residence. There is a normal inability to leave the home and doing so requires considerable and taxing effort. Other absences are for medical reasons / religious services and are infrequent or of short duration when for other reasons). o If current dressing causes regression in wound condition, may D/C ordered dressing product/s and apply Normal Saline Moist Dressing daily until next [place] / Other MD appointment. [place] of regression in wound condition at [phone]. o Please direct any NON-WOUND related issues/requests for orders to patient's Primary Care Physician Wound #2 [place] Visits - Evaluate for any therapy needs patient has and get that order from patient's PCP Dr Elsie Stain o Home Health Nurse may visit PRN to address patientos wound care needs. o FACE TO FACE ENCOUNTER: MEDICARE and MEDICAID PATIENTS: I certify that this patient is under my care and that I had a face-to-face encounter that meets the physician face-to-face encounter requirements with this patient on  this date. The encounter with the patient was in whole or in part for the following MEDICAL CONDITION: (primary reason for [place]) MEDICAL NECESSITY: I certify, that based on my findings, NURSING services are a medically necessary home health service. HOME BOUND STATUS: I certify that my clinical findings support that this patient is homebound (i.e., Due to illness or injury, pt requires aid of supportive devices such as crutches, cane, wheelchairs, walkers, the use of special transportation or the assistance of another person to leave their place of residence. There is a normal inability to leave the home and doing so requires considerable and taxing effort. Other absences are for medical reasons / religious services and are infrequent or of short duration when for other reasons). o If current dressing causes regression in wound condition, may D/C ordered dressing product/s and apply Normal Saline Moist Dressing daily until next [place] / Other MD appointment. [place] of regression in wound condition at [phone]. Hunter Hardin, Hunter Hardin ([id number]) o Please direct any NON-WOUND related issues/requests for orders to patient's Primary Care Physician Wound #3 [place] Visits - Evaluate for any therapy needs patient has and get that order from patient's PCP Dr Elsie Stain o Home Health Nurse may visit PRN to address patientos wound care needs. o FACE TO FACE ENCOUNTER: MEDICARE and MEDICAID PATIENTS: I certify that this patient is under my care and that I had a face-to-face encounter that meets the physician face-to-face encounter requirements with this patient on this date. The encounter with the patient was in whole or in part for the following MEDICAL CONDITION: (primary reason for [place]) MEDICAL NECESSITY: I certify, that based on my findings, NURSING services are a medically necessary home  health service. HOME BOUND STATUS: I certify that my clinical findings support that this patient is homebound (i.e., Due to illness or injury, pt requires aid of supportive devices such as crutches, cane, wheelchairs, walkers, the use of special transportation or the assistance of another person to leave their place of residence. There is a normal inability to leave the home and doing so requires considerable and taxing effort. Other absences are for medical reasons / religious services and are infrequent or of short duration when for other reasons). o If current dressing causes regression in wound condition, may D/C ordered dressing product/s and apply Normal Saline Moist Dressing daily until next [place] / Other MD appointment.  [place] of regression in wound condition at [phone]. o Please direct any NON-WOUND related issues/requests for orders to patient's Primary Care Physician Electronic Signature(s) Signed: 12/04/2016 3:52:57 PM By: Christin Fudge MD, FACS Signed: 12/04/2016 4:21:45 PM By: Alric Quan Previous Signature: 12/03/2016 3:42:21 PM Version By: Christin Fudge MD, FACS Previous Signature: 12/03/2016 4:30:33 PM Version By: Alric Quan Entered By: Alric Quan on 12/04/2016 11:35:33 Hunter Hardin ([id number]) -------------------------------------------------------------------------------- Problem List Details Patient Name: Hunter Hardin, Hunter Hardin. Date of Service: 12/03/2016 9:15 AM Medical Record Number: [id number] Patient Account Number: [id number] Date of Birth/Sex: Dec 30, 1934 (81 y.o. 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 Male) Treating RN: Ahmed Prima Primary Care Physician: Elsie Stain Other Clinician: Referring Physician: Elsie Stain Treating Physician/Extender: Frann Rider in Treatment: 1 Subjective Chief Complaint Information obtained from Patient Patient presents to the wound care center for a consult due non healing wound to both lower extremities which she's had for about 3 weeks now History of Present Illness (HPI) The following HPI elements were documented for the patient's wound: Location: both lower extremities right worse than left Quality: Patient reports experiencing a dull pain to affected area(s). Severity: Patient states wound are getting worse. Duration: Patient has had the wound for < 3 weeks prior to presenting for treatment Timing: Pain in wound is Intermittent (comes and goes Context: The wound would happen gradually Modifying Factors: Other treatment(s) tried include:local ointments and local care Associated Signs and Symptoms: Patient reports having increase swelling. 81 year old patient with a history of colon cancer status post right  hemicolectomy and stage IV renal cell carcinoma with metastasis to the lung and bone also has hypertension, chronic bilateral pleural effusion and chronic swelling in his legs which are now getting worse. Past medical history significant for essential hypertension, pleural effusion, colon cancer, hypothyroidism, renal cell cancer, hyperglycemia, thrombocytopenia, protein calorie malnutrition. the patient lives alone and has very little support and has a family friend who is helping him with his appointments. He has not seen a dermatologist for this condition recently. 12/03/2016 -- he continues to have a lot of pain and has not yet seen a dermatologist. Objective Constitutional Pulse regular. Respirations normal and unlabored. Afebrile. Vitals Time Taken: 9:27 AM, Height: 67 in, Weight: 138 lbs, BMI: 21.6, Pulse: 66 bpm, Respiratory Rate: 18 breaths/min, Blood Pressure: 139/75 mmHg. Hunter Hardin, Hunter Hardin ([id number]) Eyes Nonicteric. Reactive to light. Ears, Nose, Mouth, and Throat Lips, teeth, and gums WNL.[name] Moist mucosa without lesions. Neck supple and nontender. No palpable supraclavicular or cervical adenopathy. Normal sized without goiter. Respiratory WNL. No retractions.. Cardiovascular Pedal Pulses WNL. No clubbing, cyanosis or edema. Lymphatic No adneopathy. No adenopathy. No adenopathy. Musculoskeletal Adexa  without tenderness or enlargement.. Digits and nails w/o clubbing, cyanosis, infection, petechiae, ischemia, or inflammatory conditions.[name] Psychiatric Judgement and insight Intact.. No evidence of depression, anxiety, or agitation.. General Notes: lymphedema is much better than the ulcerations all look cleaner and have no surrounding cellulitis. No sharp debridement was required today. Integumentary (Hair, Skin) No suspicious lesions. No crepitus or fluctuance. No peri-wound warmth or erythema. No masses.. Wound #1 status is Open. Original cause of wound was Gradually  Appeared. The wound is located on the Left,Circumferential Lower Leg. The wound measures 19cm length x 21.3cm width x 0.1cm depth; 317.851cm^2 area and 31.785cm^3 volume. The wound is limited to skin breakdown. There is no tunneling or undermining noted. There is a large amount of serous drainage noted. The wound margin is flat and intact. There is medium (34-66%) red granulation within the wound bed. There is a medium (34-66%) amount of necrotic tissue within the wound bed including Adherent Slough. The periwound skin appearance exhibited: Maceration, Moist, Erythema. The periwound skin appearance did not exhibit: Callus, Crepitus, Excoriation, Fluctuance, Friable, Induration, Localized Edema, Rash, Scarring, Dry/Scaly, Atrophie Blanche, Cyanosis, Ecchymosis, Hemosiderin Staining, Mottled, Pallor, Rubor. The surrounding wound skin color is noted with erythema which is circumferential. Periwound temperature was noted as No Abnormality. The periwound has tenderness on palpation. Wound #2 status is Open. Original cause of wound was Gradually Appeared. The wound is located on the Left,Medial Malleolus. The wound measures 1.8cm length x 2.7cm width x 0.1cm depth; 3.817cm^2 area and 0.382cm^3 volume. The wound is limited to skin breakdown. There is no tunneling or undermining noted. There is a large amount of serous drainage noted. The wound margin is flat and intact. There is no granulation within the wound bed. There is a large (67-100%) amount of necrotic tissue within the wound Hunter Hardin, Hunter Hardin. ([id number]) bed including Adherent Slough. The periwound skin appearance exhibited: Moist, Erythema. The periwound skin appearance did not exhibit: Callus, Crepitus, Excoriation, Fluctuance, Friable, Induration, Localized Edema, Rash, Scarring, Dry/Scaly, Maceration, Atrophie Blanche, Cyanosis, Ecchymosis, Hemosiderin Staining, Mottled, Pallor, Rubor. The surrounding wound skin color is noted with  erythema which is circumferential. Periwound temperature was noted as No Abnormality. The periwound has tenderness on palpation. Wound #3 status is Open. Original cause of wound was Gradually Appeared. The wound is located on the Left,Lateral Malleolus. The wound measures 1.7cm length x 0.5cm width x 0.1cm depth; 0.668cm^2 area and 0.067cm^3 volume. The wound is limited to skin breakdown. There is no tunneling or undermining noted. There is a large amount of serous drainage noted. The wound margin is flat and intact. There is medium (34-66%) red granulation within the wound bed. There is a medium (34-66%) amount of necrotic tissue within the wound bed including Adherent Slough. The periwound skin appearance exhibited: Moist, Erythema. The periwound skin appearance did not exhibit: Callus, Crepitus, Excoriation, Fluctuance, Friable, Induration, Localized Edema, Rash, Scarring, Dry/Scaly, Maceration, Atrophie Blanche, Cyanosis, Ecchymosis, Hemosiderin Staining, Mottled, Pallor, Rubor. The surrounding wound skin color is noted with erythema which is circumferential. Periwound temperature was noted as No Abnormality. The periwound has tenderness on palpation. 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 Cleanse wound  with mild soap and water Wound #2 Left,Medial Malleolus: Clean wound with Normal Saline. Cleanse wound with mild soap and water Wound #3 Left,Lateral Malleolus: Clean wound with Normal Saline. Hunter Hardin, Hunter Hardin ([id number]) Cleanse wound with  mild soap and water Anesthetic: Wound #1 Right,Circumferential Lower Leg: Topical Lidocaine 4% cream applied to wound bed prior to debridement - clinic use Wound #2 Left,Medial Malleolus: Topical Lidocaine 4% cream applied to wound bed prior to debridement - clinic use Wound #3 Left,Lateral Malleolus: Topical Lidocaine 4% cream applied to wound bed prior to debridement - clinic use Primary Wound Dressing: Wound #1 Right,Circumferential Lower Leg: Foam - non adherent foam Other: - contact layer- non-adherent strips oil emulsion (pt prefers) or adaptic Wound #2 Left,Medial Malleolus: Foam - non adherent foam Other: - contact layer- non-adherent strips oil emulsion (pt prefers) or adaptic Wound #3 Left,Lateral Malleolus: Foam - non adherent foam Other: - contact layer- non-adherent strips oil emulsion (pt prefers) or adaptic Secondary Dressing: Wound #1 Right,Circumferential Lower Leg: ABD pad - if needed for drainage Wound #2 Left,Medial Malleolus: ABD pad - if needed for drainage Wound #3 Left,Lateral Malleolus: ABD pad - if needed for drainage Dressing Change Frequency: Wound #1 Right,Circumferential Lower Leg: Dressing is to be changed Monday and Thursday. Wound #2 Left,Medial Malleolus: Dressing is to be changed Monday and Thursday. Wound #3 Left,Lateral Malleolus: Dressing is to be changed Monday and Thursday. Follow-up Appointments: Wound #1 Right,Circumferential Lower Leg: Return Appointment in 1 week. Wound #2 Left,Medial Malleolus: Return Appointment in 1 week. Wound #3 Left,Lateral Malleolus: Return Appointment in 1 week. Edema Control: Wound #1 Right,Circumferential Lower Leg: Kerlix and Coban - Bilateral - lightly Wound #2 Left,Medial Malleolus: Kerlix and Coban - Bilateral - lightly Wound #3 Left,Lateral Malleolus: Kerlix and Coban - Bilateral - lightly Additional Orders / Instructions: Wound #1 Right,Circumferential Lower Leg: Increase protein  intake. Wound #2 Left,Medial Malleolus: Increase protein intake. Hunter Hardin, Hunter Hardin ([id number]) Wound #3 Left,Lateral Malleolus: Increase protein intake. Home Health: Wound #1 Right,Circumferential Lower Leg: Continue Home Health Visits - Evaluate for any therapy needs patient has and get that order from patient's PCP Dr Renard Matter Nurse may visit PRN to address patient s wound care needs. FACE TO FACE ENCOUNTER: MEDICARE and MEDICAID PATIENTS: I certify that this patient is under my care and that I had a face-to-face encounter that meets the physician face-to-face encounter requirements with this patient on this date. The encounter with the patient was in whole or in part for the following MEDICAL CONDITION: (primary reason for [place]) MEDICAL NECESSITY: I certify, that based on my findings, NURSING services are a medically necessary home health service. HOME BOUND STATUS: I certify that my clinical findings support that this patient is homebound (i.e., Due to illness or injury, pt requires aid of supportive devices such as crutches, cane, wheelchairs, walkers, the use of special transportation or the assistance of another person to leave their place of residence. There is a normal inability to leave the home and doing so requires considerable and taxing effort. Other absences are for medical reasons / religious services and are infrequent or of short duration when for other reasons). If current dressing causes regression in wound condition, may D/C ordered dressing product/s and apply Normal Saline Moist Dressing daily until next [place] / Other MD appointment. [place] of regression in wound condition at [phone]. Please direct any NON-WOUND related issues/requests for orders to patient's Primary Care Physician Wound #2 Left,Medial Malleolus:  Continue Home Health Visits - Evaluate for any therapy needs patient has and get that  order from patient's PCP Dr Renard Matter Nurse may visit PRN to address patient s wound care needs. FACE TO FACE ENCOUNTER: MEDICARE and MEDICAID PATIENTS: I certify that this patient is under my care and that I had a face-to-face encounter that meets the physician face-to-face encounter requirements with this patient on this date. The encounter with the patient was in whole or in part for the following MEDICAL CONDITION: (primary reason for [place]) MEDICAL NECESSITY: I certify, that based on my findings, NURSING services are a medically necessary home health service. HOME BOUND STATUS: I certify that my clinical findings support that this patient is homebound (i.e., Due to illness or injury, pt requires aid of supportive devices such as crutches, cane, wheelchairs, walkers, the use of special transportation or the assistance of another person to leave their place of residence. There is a normal inability to leave the home and doing so requires considerable and taxing effort. Other absences are for medical reasons / religious services and are infrequent or of short duration when for other reasons). If current dressing causes regression in wound condition, may D/C ordered dressing product/s and apply Normal Saline Moist Dressing daily until next [place] / Other MD appointment. [place] of regression in wound condition at [phone]. Please direct any NON-WOUND related issues/requests for orders to patient's Primary Care Physician Wound #3 Left,Lateral Malleolus: [place] Visits - Evaluate for any therapy needs patient has and get that order from patient's PCP Dr [facility] Ortiz Nurse may visit PRN to address patient s wound care needs. FACE TO FACE ENCOUNTER: MEDICARE and MEDICAID PATIENTS: I certify that this patient is under my care and that I had a face-to-face encounter that meets the physician face-to-face  encounter requirements with this patient on this date. The encounter with the patient was in whole or in part for the following MEDICAL CONDITION: (primary reason for [place]) MEDICAL NECESSITY: I certify, that based on my findings, NURSING services are a medically necessary home health service. HOME BOUND STATUS: I certify that my clinical findings support that this patient is homebound (i.e., Due to illness or injury, pt requires aid of supportive devices such as crutches, cane, wheelchairs, walkers, the use of special transportation or the assistance of another person to leave their place of residence. There is a Hunter Hardin, Hunter Hardin ([id number]) normal inability to leave the home and doing so requires considerable and taxing effort. Other absences are for medical reasons / religious services and are infrequent or of short duration when for other reasons). If current dressing causes regression in wound condition, may D/C ordered dressing product/s and apply Normal Saline Moist Dressing daily until next [place] / Other MD appointment. [place] of regression in wound condition at [phone]. Please direct any NON-WOUND related issues/requests for orders to patient's Primary Care Physician At this stage his management is palliative and hence I have recommended: 1. A nonadherent layer with foam to be lightly wrapped with Kerlix and [place] and change 3 times a week if there is significant drainage. 2. talk to his PCP regarding sleep medication and pain medications as he has a lot of generalized symptoms 3. Elevation and exercise have been discussed with him 4. dermatology opinion when available 5. Increase his protein intake, vitamin A, vitamin C and zinc 6. Weekly visits to the wound center  for evaluation He and his caregiver do understand that our treatment is going to be palliative to help him alleviate some of his symptoms Electronic  Signature(s) Signed: 12/04/2016 3:55:39 PM By: Christin Fudge MD, FACS Previous Signature: 12/03/2016 3:44:04 PM Version By: Christin Fudge MD, FACS Previous Signature: 12/03/2016 10:22:04 AM Version By: Christin Fudge MD, FACS Entered By: Christin Fudge on 12/04/2016 15:55:38 Hunter Hardin ([id number]) -------------------------------------------------------------------------------- SuperBill Details Patient Name: Hunter Hardin Date of Service: 12/03/2016 Medical Record Number: [id number] Patient Account Number: [id number] Date of Birth/Sex: June 11, 1935 (81 y.o. 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 Facility Procedures CPT4 Code: 78675449 Description: 20100 - WOUND CARE VISIT-LEV 5 EST PT Modifier: Quantity: 1 Physician Procedures CPT4 Code Description: 7121975 88325 - WC PHYS LEVEL 3 - EST PT ICD-10 Description Diagnosis I89.0 Lymphedema, not elsewhere classified L97.321 Non-pressure chronic ulcer of left ankle limited to L13.9 Bullous disorder, unspecified L97.211 Non-pressure  chronic ulcer of right calf limited to Modifier: breakdown of breakdown of Quantity: 1 skin skin Electronic Signature(s) Signed: 12/03/2016 3:44:14 PM By: Christin Fudge MD, FACS Previous Signature: 12/03/2016 3:42:21 PM Version By: Christin Fudge MD, FACS Previous Signature: 12/03/2016 10:22:19 AM Version By: Christin Fudge MD, FACS Entered By: Christin Fudge on  12/03/2016 15:44:14

## 2016-12-07 DIAGNOSIS — L97211 Non-pressure chronic ulcer of right calf limited to breakdown of skin: Secondary | ICD-10-CM | POA: Diagnosis not present

## 2016-12-07 DIAGNOSIS — L97311 Non-pressure chronic ulcer of right ankle limited to breakdown of skin: Secondary | ICD-10-CM | POA: Diagnosis not present

## 2016-12-07 DIAGNOSIS — L97321 Non-pressure chronic ulcer of left ankle limited to breakdown of skin: Secondary | ICD-10-CM | POA: Diagnosis not present

## 2016-12-07 DIAGNOSIS — I890 Lymphedema, not elsewhere classified: Secondary | ICD-10-CM | POA: Diagnosis not present

## 2016-12-07 DIAGNOSIS — I10 Essential (primary) hypertension: Secondary | ICD-10-CM | POA: Diagnosis not present

## 2016-12-07 DIAGNOSIS — L139 Bullous disorder, unspecified: Secondary | ICD-10-CM | POA: Diagnosis not present

## 2016-12-10 ENCOUNTER — Encounter: Payer: Medicare Other | Admitting: Nurse Practitioner

## 2016-12-10 DIAGNOSIS — L97211 Non-pressure chronic ulcer of right calf limited to breakdown of skin: Secondary | ICD-10-CM | POA: Diagnosis not present

## 2016-12-10 DIAGNOSIS — I872 Venous insufficiency (chronic) (peripheral): Secondary | ICD-10-CM | POA: Diagnosis not present

## 2016-12-10 DIAGNOSIS — L97321 Non-pressure chronic ulcer of left ankle limited to breakdown of skin: Secondary | ICD-10-CM | POA: Diagnosis not present

## 2016-12-12 ENCOUNTER — Observation Stay (HOSPITAL_COMMUNITY)
Admission: EM | Admit: 2016-12-12 | Discharge: 2016-12-13 | Disposition: A | Discharge status: Home / Self Care | Payer: Medicare Other | Attending: Family Medicine | Admitting: Family Medicine

## 2016-12-12 ENCOUNTER — Encounter (HOSPITAL_COMMUNITY): Payer: Self-pay | Admitting: Nurse Practitioner

## 2016-12-12 ENCOUNTER — Emergency Department (HOSPITAL_COMMUNITY): Payer: Medicare Other

## 2016-12-12 DIAGNOSIS — E785 Hyperlipidemia, unspecified: Secondary | ICD-10-CM | POA: Insufficient documentation

## 2016-12-12 DIAGNOSIS — I10 Essential (primary) hypertension: Secondary | ICD-10-CM | POA: Diagnosis present

## 2016-12-12 DIAGNOSIS — Z9049 Acquired absence of other specified parts of digestive tract: Secondary | ICD-10-CM | POA: Insufficient documentation

## 2016-12-12 DIAGNOSIS — R600 Localized edema: Secondary | ICD-10-CM | POA: Insufficient documentation

## 2016-12-12 DIAGNOSIS — J910 Malignant pleural effusion: Secondary | ICD-10-CM | POA: Insufficient documentation

## 2016-12-12 DIAGNOSIS — Z87891 Personal history of nicotine dependence: Secondary | ICD-10-CM | POA: Insufficient documentation

## 2016-12-12 DIAGNOSIS — E46 Unspecified protein-calorie malnutrition: Secondary | ICD-10-CM | POA: Insufficient documentation

## 2016-12-12 DIAGNOSIS — C189 Malignant neoplasm of colon, unspecified: Secondary | ICD-10-CM

## 2016-12-12 DIAGNOSIS — C7802 Secondary malignant neoplasm of left lung: Secondary | ICD-10-CM | POA: Insufficient documentation

## 2016-12-12 DIAGNOSIS — Z85038 Personal history of other malignant neoplasm of large intestine: Secondary | ICD-10-CM | POA: Diagnosis not present

## 2016-12-12 DIAGNOSIS — Z79899 Other long term (current) drug therapy: Secondary | ICD-10-CM | POA: Diagnosis not present

## 2016-12-12 DIAGNOSIS — R0600 Dyspnea, unspecified: Secondary | ICD-10-CM | POA: Diagnosis present

## 2016-12-12 DIAGNOSIS — C649 Malignant neoplasm of unspecified kidney, except renal pelvis: Secondary | ICD-10-CM | POA: Diagnosis not present

## 2016-12-12 DIAGNOSIS — E039 Hypothyroidism, unspecified: Secondary | ICD-10-CM | POA: Diagnosis not present

## 2016-12-12 DIAGNOSIS — R0602 Shortness of breath: Secondary | ICD-10-CM

## 2016-12-12 DIAGNOSIS — J90 Pleural effusion, not elsewhere classified: Secondary | ICD-10-CM

## 2016-12-12 DIAGNOSIS — R079 Chest pain, unspecified: Secondary | ICD-10-CM | POA: Diagnosis not present

## 2016-12-12 LAB — BASIC METABOLIC PANEL
Anion gap: 7 (ref 5–15)
BUN: 25 mg/dL — ABNORMAL HIGH (ref 6–20)
CALCIUM: 7.9 mg/dL — AB (ref 8.9–10.3)
CO2: 26 mmol/L (ref 22–32)
CREATININE: 1.17 mg/dL (ref 0.61–1.24)
Chloride: 103 mmol/L (ref 101–111)
GFR calc Af Amer: >60 mL/min (ref >60)
GFR calc non Af Amer: 57 mL/min — ABNORMAL LOW (ref >60)
GLUCOSE: 79 mg/dL (ref 65–99)
Potassium: 4.3 mmol/L (ref 3.5–5.1)
Sodium: 136 mmol/L (ref 135–145)

## 2016-12-12 LAB — I-STAT TROPONIN, ED: Troponin i, poc: 0 ng/mL (ref 0.00–0.08)

## 2016-12-12 LAB — CBC WITH DIFFERENTIAL/PLATELET
Basophils Absolute: 0 10*3/uL (ref 0.0–0.1)
Basophils Relative: 0 %
Eosinophils Absolute: 0.1 10*3/uL (ref 0.0–0.7)
Eosinophils Relative: 1 %
HEMATOCRIT: 36.8 % — AB (ref 39.0–52.0)
Hemoglobin: 12.1 g/dL — ABNORMAL LOW (ref 13.0–17.0)
LYMPHS PCT: 41 %
Lymphs Abs: 2.3 10*3/uL (ref 0.7–4.0)
MCH: 33 pg (ref 26.0–34.0)
MCHC: 32.9 g/dL (ref 30.0–36.0)
MCV: 100.3 fL — AB (ref 78.0–100.0)
MONO ABS: 0.4 10*3/uL (ref 0.1–1.0)
MONOS PCT: 7 %
NEUTROS ABS: 2.8 10*3/uL (ref 1.7–7.7)
Neutrophils Relative %: 51 %
Platelets: 144 10*3/uL — ABNORMAL LOW (ref 150–400)
RBC: 3.67 MIL/uL — ABNORMAL LOW (ref 4.22–5.81)
RDW: 15.3 % (ref 11.5–15.5)
WBC: 5.6 10*3/uL (ref 4.0–10.5)

## 2016-12-12 MED ORDER — ALBUTEROL SULFATE (2.5 MG/3ML) 0.083% IN NEBU
5.0000 mg | INHALATION_SOLUTION | Freq: Once | RESPIRATORY_TRACT | Status: AC
  Administered 2016-12-12: 5 mg via RESPIRATORY_TRACT
  Filled 2016-12-12: qty 6

## 2016-12-12 NOTE — ED Notes (Addendum)
Pt states that he feels a tightness/pressure in his sinuses since the breathing treatment stopped.

## 2016-12-12 NOTE — ED Triage Notes (Signed)
Pt is cancer pt (lung and kidney) recently had a thoracentesis secondary to pleural effusion presenting with c/o increasing shortness of breath since this morning. Denies chest pain and without obvious signs of distress.

## 2016-12-12 NOTE — H&P (Signed)
Hunter Hardin. YKD:[id number] DOB: 01-Jan-1935 DOA: 12/12/2016     PCP: Elsie Stain, MD   Outpatient Specialists: [facility]  Patient coming from:   home Lives alone    Chief Complaint: Shortness of breath dyspnea on exertion  HPI: Hunter Hardin. is a 81 y.o. male with medical history significant of renal cell carcinoma with metastases to the lung with malignant recurrent left pleural effusion, Hypertension, history of colon cancer status post right hemicolectomy hypothyroidism, hypercholesteremia thrombocytopenia, protein malnutrition  Presented with onset sense this am of severe dyspnea, no chest pain.  No fever no chills. He has chronic lower extremity edema with wounds. Denies any cough Last pleural effusion drainage 10 days ago by IR done at [facility]    Patient known history of renal cell carcinoma oral chemotherapy agent. The review of records his primary care provider was discussing possibility of hospice at this point he is not ready to transition  Regarding pertinent Chronic problems: History of hypertension maintain on hydrochlorothiazide and  Examination his history of leg ulcers followed by wound care has been wraped on thurday family states the wounds have been healing. He has chronic lower extremity swelling for which she's been taking Lasix.  IN ER:  Temp (24hrs), Avg:98.5 F (36.9 C), Min:98.5 F (36.9 C), Max:98.5 F (36.9 C)     RR 21 94% HR60 Bp 122/67 WBC 5.6 Hg 12.1 Cr 1.17 close to baseline  CXR showed reoccumulation of pleural effusion on the left. Following Medications were ordered in ER: Medications  albuterol (PROVENTIL) (2.5 MG/3ML) 0.083% nebulizer solution 5 mg (5 mg Nebulization Given 12/12/16 1914)     ER provider discussed case with:  Interventional  Radiology from [place] who recommended readmission  IR consult in a.m.  Hospitalist was called for admission for recurrent pleural effusion resulting in shortness of  breath  Review of Systems:    Pertinent positives include:  shortness of breath at rest dyspnea on exertion,   Constitutional:  No weight loss, night sweats, Fevers, chills, fatigue, weight loss  HEENT:  No headaches, Difficulty swallowing,Tooth/dental problems,Sore throat,  No sneezing, itching, ear ache, nasal congestion, post nasal drip,  Cardio-vascular:  No chest pain, Orthopnea, PND, anasarca, dizziness, palpitations.no Bilateral lower extremity swelling  GI:  No heartburn, indigestion, abdominal pain, nausea, vomiting, diarrhea, change in bowel habits, loss of appetite, melena, blood in stool, hematemesis Resp:   No excess mucus, no productive cough, No non-productive cough, No coughing up of blood.No change in color of mucus.No wheezing. Skin:  no rash or lesions. No jaundice GU:  no dysuria, change in color of urine, no urgency or frequency. No straining to urinate.  No flank pain.  Musculoskeletal:  No joint pain or no joint swelling. No decreased range of motion. No back pain.  Psych:  No change in mood or affect. No depression or anxiety. No memory loss.  Neuro: no localizing neurological complaints, no tingling, no weakness, no double vision, no gait abnormality, no slurred speech, no confusion  As per HPI otherwise 10 point review of systems negative.   Past Medical History: Past Medical History:  Diagnosis Date  . Colon cancer ([place]) dx'd 01/2013  . Elevated PSA    H/O  . GERD (gastroesophageal reflux disease)   . HLD (hyperlipidemia)   . Hypertension   . Hypothyroidism   . Metastasis to lung ([place]) dx'd 01/2013  . Pleural effusion   . Protein calorie malnutrition ([place]) 06/13/2013  . renal ca  dx'd 01/2013   Past Surgical History:  Procedure Laterality Date  . CHOLECYSTECTOMY  early 55's  . COLONOSCOPY  01/2013  . LAPAROSCOPIC PARTIAL COLECTOMY N/A 03/06/2013   Procedure: LAPAROSCOPic assisted right hemi COLECTOMY;  Surgeon: Leighton Ruff, MD;  Location: [facility]  ORS;  Service: General;  Laterality: N/A;  . VASECTOMY       Social History:  Ambulatory    Independent or cane     reports that he quit smoking about 30 years ago. His smoking use included Cigarettes. He has a 60.00 pack-year smoking history. He has never used smokeless tobacco. He reports that he drinks alcohol. He reports that he does not use drugs.  Allergies:  No Known Allergies     Family History:   Family History  Problem Relation Age of Onset  . Hypertension Mother   . Hyperlipidemia Mother   . Dementia Mother   . Heart disease Father     ? MI, CAD  . Cancer Brother     oral/tonsil cancer 2012  . Cancer Sister     breast cancer  . Diabetes Cousin   . Colon cancer Neg Hx   . Prostate cancer Neg Hx     Medications: Prior to Admission medications   Medication Sig Start Date End Date Taking? Authorizing Provider  benzonatate (TESSALON) 200 MG capsule Take 1 capsule (200 mg total) by mouth 3 (three) times daily as needed for cough. 10/30/16   Tonia Ghent, MD  cabozantinib S-Malate (CABOMETYX) 20 MG TABS Take 1 tablet by mouth daily. Patient taking differently: Take 1 tablet by mouth every evening.  05/21/16   [name], MD  doxazosin (CARDURA) 8 MG tablet TAKE 1 TABLET DAILY 06/15/16   Tonia Ghent, MD  finasteride (PROSCAR) 5 MG tablet Take 5 mg by mouth daily. 01/09/16   Historical Provider, MD  fish oil-omega-3 fatty acids 1000 MG capsule Take 1 g by mouth 2 (two) times daily.    Historical Provider, MD  furosemide (LASIX) 20 MG tablet Take 1 tablet (20 mg total) by mouth daily as needed for fluid. 12/01/16   Tonia Ghent, MD  hydrocortisone 2.5 % cream Apply topically 3 (three) times daily as needed. 04/17/16   Venia Carbon, MD  KLOR-CON M20 20 MEQ tablet TAKE 1 TABLET TWICE A DAY 08/17/16   Tonia Ghent, MD  lactose free nutrition (BOOST) LIQD Take 237 mLs by mouth daily.    Historical Provider, MD  levothyroxine (SYNTHROID, LEVOTHROID) 50 MCG  tablet TAKE 2 TABLETS ON SUNDAY AND 1 TABLET ON ALL OTHER DAYS (8 TABLETS A WEEK TOTAL) 08/17/16   Tonia Ghent, MD  loperamide (IMODIUM A-D) 2 MG tablet Take 2 mg by mouth as needed for diarrhea or loose stools.    Historical Provider, MD  ondansetron (ZOFRAN) 8 MG tablet Take 1 tablet (8 mg total) by mouth every 8 (eight) hours as needed. for nausea 02/26/16   Tonia Ghent, MD  propranolol (INDERAL) 40 MG tablet Take 0.5 tablets (20 mg total) by mouth 2 (two) times daily. 06/05/15   Tonia Ghent, MD  traMADol (ULTRAM) 50 MG tablet Take 1 tablet (50 mg total) by mouth every 8 (eight) hours as needed. 12/04/16   Tonia Ghent, MD  verapamil (CALAN-SR) 240 MG CR tablet TAKE 1 TABLET DAILY 06/15/16   Tonia Ghent, MD  vitamin C (ASCORBIC ACID) 500 MG tablet Take 500 mg by mouth daily.    Historical  Provider, MD    Physical Exam: Patient Vitals for the past 24 hrs:  BP Temp Temp src Pulse Resp SpO2  12/12/16 1900 122/67 - - 60 21 94 %  12/12/16 1853 123/70 - - 63 23 94 %  12/12/16 1814 110/64 98.5 F (36.9 C) Oral 64 18 96 %    1. General:  in No Acute distress 2. Psychological: Alert and   Oriented 3. Head/ENT:     Dry Mucous Membranes                          Head Non traumatic, neck supple                            Poor Dentition 4. SKIN:   decreased Skin turgor,  Skin clean Dry and intact no rash 5. Heart: Regular rate and rhythm no Murmur, Rub or gallop 6. Lungs:  Diminished in the left no wheezes or crackles   7. Abdomen: Soft, non-tender, Non distended 8. Lower extremities: no clubbing, cyanosis, or edema 9. Neurologically Grossly intact, moving all 4 extremities equally   10. MSK: Normal range of motion   body mass index is unknown because there is no height or weight on file.  Labs on Admission:   Labs on Admission: I have personally reviewed following labs and imaging studies  CBC:  Recent Labs Lab 12/12/16 1955  WBC 5.6  NEUTROABS 2.8  HGB 12.1*  HCT  36.8*  MCV 100.3*  PLT 341*   Basic Metabolic Panel:  Recent Labs Lab 12/12/16 1955  NA 136  K 4.3  CL 103  CO2 26  GLUCOSE 79  BUN 25*  CREATININE 1.17  CALCIUM 7.9*   GFR: Estimated Creatinine Clearance: 43.5 mL/min (by C-G formula based on SCr of 1.17 mg/dL). Liver Function Tests: No results for input(s): AST, ALT, ALKPHOS, BILITOT, PROT, ALBUMIN in the last 168 hours. No results for input(s): LIPASE, AMYLASE in the last 168 hours. No results for input(s): AMMONIA in the last 168 hours. Coagulation Profile: No results for input(s): INR, PROTIME in the last 168 hours. Cardiac Enzymes: No results for input(s): CKTOTAL, CKMB, CKMBINDEX, TROPONINI in the last 168 hours. BNP (last 3 results) No results for input(s): PROBNP in the last 8760 hours. HbA1C: No results for input(s): HGBA1C in the last 72 hours. CBG: No results for input(s): GLUCAP in the last 168 hours. Lipid Profile: No results for input(s): CHOL, HDL, LDLCALC, TRIG, CHOLHDL, LDLDIRECT in the last 72 hours. Thyroid Function Tests: No results for input(s): TSH, T4TOTAL, FREET4, T3FREE, THYROIDAB in the last 72 hours. Anemia Panel: No results for input(s): VITAMINB12, FOLATE, FERRITIN, TIBC, IRON, RETICCTPCT in the last 72 hours. Urine analysis: No results found for: COLORURINE, APPEARANCEUR, LABSPEC, PHURINE, GLUCOSEU, HGBUR, BILIRUBINUR, KETONESUR, PROTEINUR, UROBILINOGEN, NITRITE, LEUKOCYTESUR Sepsis Labs: '@LABRCNTIP'$ (procalcitonin:4,lacticidven:4) )No results found for this or any previous visit (from the past 240 hour(s)).    UA  not ordered  Lab Results  Component Value Date   HGBA1C 5.6 07/21/2005    Estimated Creatinine Clearance: 43.5 mL/min (by C-G formula based on SCr of 1.17 mg/dL).  BNP (last 3 results) No results for input(s): PROBNP in the last 8760 hours.   ECG REPORT  Independently reviewed Rate: 64  Rhythm: low voltage ST&T Change: t wave flattening QTC  417  There were no  vitals filed for this visit.   Cultures:  Component Value Date/Time   SDES FLUID LEFT PLEURAL 10/31/2016 0950   SDES FLUID LEFT PLEURAL 10/31/2016 0950   SPECREQUEST NONE 10/31/2016 0950   SPECREQUEST NONE 10/31/2016 0950   CULT  10/31/2016 0950    NO GROWTH 5 DAYS Performed at [place] 11/05/2016 FINAL 10/31/2016 0950   REPTSTATUS 10/31/2016 FINAL 10/31/2016 0950     Radiological Exams on Admission: Dg Chest 2 View  Result Date: 12/12/2016 CLINICAL DATA:  History of recent thoracentesis with increasing chest pain and shortness of breath, history of known renal cell carcinoma with lung metastasis EXAM: CHEST  2 VIEW COMPARISON:  12/02/2016 FINDINGS: There is increasing pleural effusion identified on the left when compared with the prior exam. The previously seen hydropneumothorax has now almost completely resolved with only a small apical component noted. This does not represent a true pneumothorax but rather incomplete re-expansion of the left lung following previous thoracentesis. The space has nearly completely filled with fluid. Right lung again demonstrates some patchy fibrotic changes stable from the prior study. No acute infiltrate is seen. IMPRESSION: Increasing left-sided pleural effusion with nearly completely collapsed left lung secondary to underlying neoplasm and wet lung. These results were called by telephone at the time of interpretation on 12/12/2016 at 7:23 Pm to Dr. Brantley Stage , who verbally acknowledged these results. Electronically Signed   By: Inez Catalina M.D.   On: 12/12/2016 19:23    Chart has been reviewed    Assessment/Plan  81 y.o. male with medical history significant of renal cell carcinoma with metastases to the lung with malignant recurrent left pleural effusion, Hypertension, history of colon cancer status post right hemicolectomy hypothyroidism, hypercholesteremia thrombocytopenia, protein malnutrition been admitted for dyspnea  likely secondary to recurrent malignant left pleural effusion   Present on Admission: Dyspnea likely secondary to large left pleural effusion. Given sudden onset will evaluate for other causes will cycle cardiac enzymes monitor on telemetry.  If does not resolve despite thoracentesis would need to further evaluation Large pleural effusion secondary to malignancy. Patient requires recurrent thoracentesis. Would consider Peridex catheter. IR consult placed by mouth post midnight . Essential hypertension and tinea home medications currently stable . Protein calorie malnutrition ([place]) continue supplementation check prealbumin nutritional consult ordered . Renal cell cancer ([place]) we'll need to follow up with oncology as an outpatient rate overall poor prognosis patient required repeated procedures. We'll need to discuss overall goals of care with oncology . Colon cancer [facility]) remote currently stable currently in remission  Other plan as per orders.  DVT prophylaxis:  SCD    Code Status:  FULL CODE  as per patient   Family Communication:   Family   at  Bedside  plan of care was discussed with   Son, and friend  Disposition Plan:     To home once workup is complete and patient is stable                        Would benefit from PT/OT eval prior to DC  ordered                           Nutrition    consulted                          Consults called: IR consult in epic    Admission status:    obs  Level of care    tele         I have spent a total of 56 min on this admission    Beda Dula 12/12/2016, 10:18 PM    [facility]  Pager [phone]   after 2 AM please page floor coverage PA If 7AM-7PM, please contact the day team taking care of the patient  [email or web address]  Password [email or web address]

## 2016-12-12 NOTE — ED Provider Notes (Signed)
[facility] DEPT Provider Note   CSN: [id number] Arrival date & time: 12/12/16  1758     History   Chief Complaint Chief Complaint  Patient presents with  . Shortness of Breath  . CA pt    HPI Hunter Hardin. is a 81 y.o. male.  HPI  81 year old male who presents with shortness of breath. He has a history of renal cell carcinoma with metastasis to the lung on oral chemotherapy followed by Dr. Rodena Piety. Has had 2 thoracentesis for recurrent malignant effusion in the left lung, last was performed at [place] 10 days ago. States that he has been in his usual state of health and this morning awoke with dyspnea on exertion. No chest pain, lower extremity edema, cough, fevers or chills.  Past Medical History:  Diagnosis Date  . Colon cancer ([place]) dx'd 01/2013  . Elevated PSA    H/O  . GERD (gastroesophageal reflux disease)   . HLD (hyperlipidemia)   . Hypertension   . Hypothyroidism   . Metastasis to lung ([place]) dx'd 01/2013  . Pleural effusion   . Protein calorie malnutrition ([place]) 06/13/2013  . renal ca dx'd 01/2013    Patient Active Problem List   Diagnosis Date Noted  . Skin ulceration ([place]) 12/02/2016  . Left groin hernia 06/17/2016  . Pleural effusion 06/17/2016  . External hemorrhoid 04/17/2016  . Advance care planning 06/06/2015  . Edema 04/03/2014  . Thrombocytopenia ([place]) 02/23/2014  . Protein calorie malnutrition ([place]) 06/13/2013  . Renal cell cancer ([place]) 05/21/2013  . Colon cancer ([place]) 03/07/2013  . Preoperative evaluation to rule out surgical contraindication 03/03/2013  . Medicare annual wellness visit, initial 11/04/2012  . HYPERGLYCEMIA 05/01/2007  . Hypothyroidism 04/29/2007  . HLD (hyperlipidemia) 04/29/2007  . Essential hypertension 04/29/2007  . ELEVATED PROSTATE SPECIFIC ANTIGEN 04/29/2007    Past Surgical History:  Procedure Laterality Date  . CHOLECYSTECTOMY  early 47's  . COLONOSCOPY  01/2013  . LAPAROSCOPIC PARTIAL COLECTOMY N/A  03/06/2013   Procedure: LAPAROSCOPic assisted right hemi COLECTOMY;  Surgeon: Leighton Ruff, MD;  Location: [facility] ORS;  Service: General;  Laterality: N/A;  . VASECTOMY         Home Medications    Prior to Admission medications   Medication Sig Start Date End Date Taking? Authorizing Provider  benzonatate (TESSALON) 200 MG capsule Take 1 capsule (200 mg total) by mouth 3 (three) times daily as needed for cough. 10/30/16   Tonia Ghent, MD  cabozantinib S-Malate (CABOMETYX) 20 MG TABS Take 1 tablet by mouth daily. Patient taking differently: Take 1 tablet by mouth every evening.  05/21/16   [name], MD  doxazosin (CARDURA) 8 MG tablet TAKE 1 TABLET DAILY 06/15/16   Tonia Ghent, MD  finasteride (PROSCAR) 5 MG tablet Take 5 mg by mouth daily. 01/09/16   Historical Provider, MD  fish oil-omega-3 fatty acids 1000 MG capsule Take 1 g by mouth 2 (two) times daily.    Historical Provider, MD  furosemide (LASIX) 20 MG tablet Take 1 tablet (20 mg total) by mouth daily as needed for fluid. 12/01/16   Tonia Ghent, MD  hydrocortisone 2.5 % cream Apply topically 3 (three) times daily as needed. 04/17/16   Venia Carbon, MD  KLOR-CON M20 20 MEQ tablet TAKE 1 TABLET TWICE A DAY 08/17/16   Tonia Ghent, MD  lactose free nutrition (BOOST) LIQD Take 237 mLs by mouth daily.    Historical Provider, MD  levothyroxine (SYNTHROID, LEVOTHROID)  50 MCG tablet TAKE 2 TABLETS ON SUNDAY AND 1 TABLET ON ALL OTHER DAYS (8 TABLETS A WEEK TOTAL) 08/17/16   Tonia Ghent, MD  loperamide (IMODIUM A-D) 2 MG tablet Take 2 mg by mouth as needed for diarrhea or loose stools.    Historical Provider, MD  ondansetron (ZOFRAN) 8 MG tablet Take 1 tablet (8 mg total) by mouth every 8 (eight) hours as needed. for nausea 02/26/16   Tonia Ghent, MD  propranolol (INDERAL) 40 MG tablet Take 0.5 tablets (20 mg total) by mouth 2 (two) times daily. 06/05/15   Tonia Ghent, MD  traMADol (ULTRAM) 50 MG tablet Take 1 tablet (50  mg total) by mouth every 8 (eight) hours as needed. 12/04/16   Tonia Ghent, MD  verapamil (CALAN-SR) 240 MG CR tablet TAKE 1 TABLET DAILY 06/15/16   Tonia Ghent, MD  vitamin C (ASCORBIC ACID) 500 MG tablet Take 500 mg by mouth daily.    Historical Provider, MD    Family History Family History  Problem Relation Age of Onset  . Hypertension Mother   . Hyperlipidemia Mother   . Dementia Mother   . Heart disease Father     ? MI, CAD  . Cancer Brother     oral/tonsil cancer 2012  . Cancer Sister     breast cancer  . Diabetes Cousin   . Colon cancer Neg Hx   . Prostate cancer Neg Hx     Social History Social History  Substance Use Topics  . Smoking status: Former Smoker    Packs/day: 2.00    Years: 30.00    Types: Cigarettes    Quit date: 12/21/1985  . Smokeless tobacco: Never Used  . Alcohol use 0.0 oz/week     Comment: beer occassionally     Allergies   Patient has no known allergies.   Review of Systems Review of Systems 10/14 systems reviewed and are negative other than those stated in the HPI   Physical Exam Updated Vital Signs BP 124/72 (BP Location: Left Arm)   Pulse 66   Temp 98.5 F (36.9 C) (Oral)   Resp 18   Ht '5\' 11"'$  (1.803 m)   Wt 132 lb (59.9 kg)   SpO2 95%   BMI 18.41 kg/m   Physical Exam Physical Exam  Nursing note and vitals reviewed. Constitutional: non-toxic, and in no acute distress Head: Normocephalic and atraumatic.  Mouth/Throat: Oropharynx is clear and moist.  Neck: Normal range of motion. Neck supple.  Cardiovascular: Normal rate and regular rhythm.   Pulmonary/Chest: Effort normal. No conversational dyspnea. Diminished breath sounds over the left lung  Abdominal: Soft. There is no tenderness. There is no rebound and no guarding.  Musculoskeletal: Normal range of motion.  Neurological: Alert, no facial droop, fluent speech, moves all extremities symmetrically Skin: Skin is warm and dry.  Psychiatric: Cooperative   ED  Treatments / Results  Labs (all labs ordered are listed, but only abnormal results are displayed) Labs Reviewed  CBC WITH DIFFERENTIAL/PLATELET - Abnormal; Notable for the following:       Result Value   RBC 3.67 (*)    Hemoglobin 12.1 (*)    HCT 36.8 (*)    MCV 100.3 (*)    Platelets 144 (*)    All other components within normal limits  BASIC METABOLIC PANEL - Abnormal; Notable for the following:    BUN 25 (*)    Calcium 7.9 (*)    GFR calc  non Af Amer 57 (*)    All other components within normal limits  I-STAT TROPOININ, ED    EKG  EKG Interpretation  Date/Time:  Saturday December 12 2016 18:10:52 EST Ventricular Rate:  64 PR Interval:    QRS Duration: 85 QT Interval:  404 QTC Calculation: 417 R Axis:   71 Text Interpretation:  sinus  rhythm Low voltage, extremity and precordial leads Nonspecific T abnormalities, lateral leads similar to last EKG Confirmed by Birdella Sippel MD, Arali Somera [phone]) on 12/12/2016 6:30:02 PM       Radiology Dg Chest 2 View  Result Date: 12/12/2016 CLINICAL DATA:  History of recent thoracentesis with increasing chest pain and shortness of breath, history of known renal cell carcinoma with lung metastasis EXAM: CHEST  2 VIEW COMPARISON:  12/02/2016 FINDINGS: There is increasing pleural effusion identified on the left when compared with the prior exam. The previously seen hydropneumothorax has now almost completely resolved with only a small apical component noted. This does not represent a true pneumothorax but rather incomplete re-expansion of the left lung following previous thoracentesis. The space has nearly completely filled with fluid. Right lung again demonstrates some patchy fibrotic changes stable from the prior study. No acute infiltrate is seen. IMPRESSION: Increasing left-sided pleural effusion with nearly completely collapsed left lung secondary to underlying neoplasm and wet lung. These results were called by telephone at the time of interpretation on  12/12/2016 at 7:23 Pm to Dr. Brantley Stage , who verbally acknowledged these results. Electronically Signed   By: Inez Catalina M.D.   On: 12/12/2016 19:23    Procedures Procedures (including critical care time)  Medications Ordered in ED Medications  albuterol (PROVENTIL) (2.5 MG/3ML) 0.083% nebulizer solution 5 mg (5 mg Nebulization Given 12/12/16 1914)     Initial Impression / Assessment and Plan / ED Course  I have reviewed the triage vital signs and the nursing notes.  Pertinent labs & imaging results that were available during my care of the patient were reviewed by me and considered in my medical decision making (see chart for details).  Clinical Course     81 year old male who presents with recurrent malignant effusion of the left lung. Chest x-ray visualized and shows reaccumulation of fluid poorly expanding left lung after a recent thoracentesis. He is on room air with normal work of breathing and normal oxygenation. Some scattered wheezing on exam, improved after albuterol. Does not appear to have any infectious symptoms and no prior history of cardiac problems like heart failure. Considered PE, but felt likely more to be due to recurrent left pleural effusion. Did have CT chest with IV contrast by Dr. Rodena Piety 12/5 showing unchanged left pericardial mass and left lower lobe mass of the lung. Discussed with Dr. Roel Cluck will admit for thoracentesis.  Final Clinical Impressions(s) / ED Diagnoses   Final diagnoses:  Recurrent left pleural effusion  Renal cell carcinoma, unspecified laterality ([place])  Malignant neoplasm metastatic to left lung [facility])    New Prescriptions New Prescriptions   No medications on file     Forde Dandy, MD 12/12/16 2123

## 2016-12-13 ENCOUNTER — Observation Stay (HOSPITAL_COMMUNITY): Payer: Medicare Other

## 2016-12-13 DIAGNOSIS — C7802 Secondary malignant neoplasm of left lung: Secondary | ICD-10-CM | POA: Diagnosis not present

## 2016-12-13 DIAGNOSIS — E440 Moderate protein-calorie malnutrition: Secondary | ICD-10-CM | POA: Diagnosis not present

## 2016-12-13 DIAGNOSIS — I10 Essential (primary) hypertension: Secondary | ICD-10-CM

## 2016-12-13 DIAGNOSIS — C649 Malignant neoplasm of unspecified kidney, except renal pelvis: Secondary | ICD-10-CM | POA: Diagnosis not present

## 2016-12-13 DIAGNOSIS — J90 Pleural effusion, not elsewhere classified: Secondary | ICD-10-CM

## 2016-12-13 LAB — CBC
HCT: 39.1 % (ref 39.0–52.0)
Hemoglobin: 12.9 g/dL — ABNORMAL LOW (ref 13.0–17.0)
MCH: 33.2 pg (ref 26.0–34.0)
MCHC: 33 g/dL (ref 30.0–36.0)
MCV: 100.8 fL — AB (ref 78.0–100.0)
PLATELETS: 138 10*3/uL — AB (ref 150–400)
RBC: 3.88 MIL/uL — ABNORMAL LOW (ref 4.22–5.81)
RDW: 15.5 % (ref 11.5–15.5)
WBC: 5.9 10*3/uL (ref 4.0–10.5)

## 2016-12-13 LAB — TROPONIN I
Troponin I: <0.03 ng/mL (ref <0.03)
Troponin I: <0.03 ng/mL (ref <0.03)
Troponin I: <0.03 ng/mL (ref <0.03)

## 2016-12-13 LAB — COMPREHENSIVE METABOLIC PANEL
ALT: 21 U/L (ref 17–63)
AST: 21 U/L (ref 15–41)
Albumin: 2.7 g/dL — ABNORMAL LOW (ref 3.5–5.0)
Alkaline Phosphatase: 76 U/L (ref 38–126)
Anion gap: 4 — ABNORMAL LOW (ref 5–15)
BUN: 23 mg/dL — AB (ref 6–20)
CHLORIDE: 103 mmol/L (ref 101–111)
CO2: 30 mmol/L (ref 22–32)
CREATININE: 1.18 mg/dL (ref 0.61–1.24)
Calcium: 8.2 mg/dL — ABNORMAL LOW (ref 8.9–10.3)
GFR calc Af Amer: >60 mL/min (ref >60)
GFR calc non Af Amer: 57 mL/min — ABNORMAL LOW (ref >60)
GLUCOSE: 86 mg/dL (ref 65–99)
Potassium: 4.7 mmol/L (ref 3.5–5.1)
SODIUM: 137 mmol/L (ref 135–145)
Total Bilirubin: 1 mg/dL (ref 0.3–1.2)
Total Protein: 6.1 g/dL — ABNORMAL LOW (ref 6.5–8.1)

## 2016-12-13 LAB — MAGNESIUM: Magnesium: 1.2 mg/dL — ABNORMAL LOW (ref 1.7–2.4)

## 2016-12-13 LAB — PHOSPHORUS: Phosphorus: 3.1 mg/dL (ref 2.5–4.6)

## 2016-12-13 LAB — PREALBUMIN: PREALBUMIN: 10.6 mg/dL — AB (ref 18–38)

## 2016-12-13 LAB — TSH: TSH: 43.498 u[IU]/mL — AB (ref 0.350–4.500)

## 2016-12-13 MED ORDER — GUAIFENESIN ER 600 MG PO TB12
600.0000 mg | ORAL_TABLET | Freq: Two times a day (BID) | ORAL | Status: DC
  Administered 2016-12-13 (×2): 600 mg via ORAL
  Filled 2016-12-13 (×2): qty 1

## 2016-12-13 MED ORDER — BOOST PO LIQD
237.0000 mL | Freq: Every day | ORAL | Status: DC
  Administered 2016-12-13: 237 mL via ORAL
  Filled 2016-12-13: qty 237

## 2016-12-13 MED ORDER — SODIUM CHLORIDE 0.9% FLUSH: 3.0000 mL | INTRAVENOUS | Status: DC | PRN

## 2016-12-13 MED ORDER — SODIUM CHLORIDE 0.9% FLUSH
3.0000 mL | Freq: Two times a day (BID) | INTRAVENOUS | Status: DC
  Administered 2016-12-13: 3 mL via INTRAVENOUS

## 2016-12-13 MED ORDER — PROPRANOLOL HCL 10 MG PO TABS
20.0000 mg | ORAL_TABLET | Freq: Two times a day (BID) | ORAL | Status: DC
  Administered 2016-12-13 (×2): 20 mg via ORAL
  Filled 2016-12-13 (×2): qty 2

## 2016-12-13 MED ORDER — HYDROCODONE-ACETAMINOPHEN 5-325 MG PO TABS
1.0000 | ORAL_TABLET | ORAL | Status: DC | PRN
  Administered 2016-12-13 (×2): 1 via ORAL
  Filled 2016-12-13: qty 2
  Filled 2016-12-13: qty 1

## 2016-12-13 MED ORDER — ACETAMINOPHEN 325 MG PO TABS: 650.0000 mg | ORAL_TABLET | Freq: Four times a day (QID) | ORAL | Status: DC | PRN

## 2016-12-13 MED ORDER — ONDANSETRON HCL 4 MG PO TABS: 4.0000 mg | ORAL_TABLET | Freq: Four times a day (QID) | ORAL | Status: DC | PRN

## 2016-12-13 MED ORDER — POTASSIUM CHLORIDE CRYS ER 20 MEQ PO TBCR
20.0000 meq | EXTENDED_RELEASE_TABLET | Freq: Two times a day (BID) | ORAL | Status: DC
  Administered 2016-12-13 (×2): 20 meq via ORAL
  Filled 2016-12-13 (×2): qty 1

## 2016-12-13 MED ORDER — MAGNESIUM CHLORIDE 64 MG PO TBEC
2.0000 | DELAYED_RELEASE_TABLET | Freq: Every day | ORAL | Status: DC
  Filled 2016-12-13: qty 2

## 2016-12-13 MED ORDER — FINASTERIDE 5 MG PO TABS
5.0000 mg | ORAL_TABLET | Freq: Every day | ORAL | Status: DC
  Administered 2016-12-13: 5 mg via ORAL
  Filled 2016-12-13: qty 1

## 2016-12-13 MED ORDER — FUROSEMIDE 20 MG PO TABS: 20.0000 mg | ORAL_TABLET | Freq: Every day | ORAL | Status: DC | PRN

## 2016-12-13 MED ORDER — TRAMADOL HCL 50 MG PO TABS: 50.0000 mg | ORAL_TABLET | Freq: Three times a day (TID) | ORAL | Status: DC | PRN

## 2016-12-13 MED ORDER — SODIUM CHLORIDE 0.9 % IV SOLN
250.0000 mL | INTRAVENOUS | Status: DC | PRN
Start: 2016-12-13 — End: 2016-12-13

## 2016-12-13 MED ORDER — CABOZANTINIB S-MALATE 20 MG PO TABS
1.0000 | ORAL_TABLET | Freq: Every day | ORAL | Status: DC
  Administered 2016-12-13: 1 via ORAL

## 2016-12-13 MED ORDER — LEVOTHYROXINE SODIUM 50 MCG PO TABS
50.0000 ug | ORAL_TABLET | Freq: Every day | ORAL | Status: DC
  Administered 2016-12-13: 50 ug via ORAL
  Filled 2016-12-13: qty 1

## 2016-12-13 MED ORDER — ADULT MULTIVITAMIN W/MINERALS CH
1.0000 | ORAL_TABLET | Freq: Every day | ORAL | Status: DC
  Administered 2016-12-13: 1 via ORAL

## 2016-12-13 MED ORDER — SODIUM CHLORIDE 0.9% FLUSH: 3.0000 mL | Freq: Two times a day (BID) | INTRAVENOUS | Status: DC

## 2016-12-13 MED ORDER — BOOST PO LIQD
237.0000 mL | Freq: Three times a day (TID) | ORAL | Status: DC
  Administered 2016-12-13: 237 mL via ORAL
  Filled 2016-12-13 (×2): qty 237

## 2016-12-13 MED ORDER — VERAPAMIL HCL ER 240 MG PO TBCR
240.0000 mg | EXTENDED_RELEASE_TABLET | Freq: Every day | ORAL | Status: DC
  Administered 2016-12-13: 240 mg via ORAL
  Filled 2016-12-13: qty 1

## 2016-12-13 MED ORDER — MAGNESIUM CHLORIDE 64 MG PO TBEC: 2.0000 | DELAYED_RELEASE_TABLET | Freq: Every day | ORAL | 0 refills | Status: DC

## 2016-12-13 MED ORDER — ENSURE ENLIVE PO LIQD
237.0000 mL | Freq: Two times a day (BID) | ORAL | Status: DC
  Administered 2016-12-13: 237 mL via ORAL

## 2016-12-13 MED ORDER — ACETAMINOPHEN 650 MG RE SUPP: 650.0000 mg | Freq: Four times a day (QID) | RECTAL | Status: DC | PRN

## 2016-12-13 MED ORDER — ONDANSETRON HCL 4 MG/2ML IJ SOLN: 4.0000 mg | Freq: Four times a day (QID) | INTRAMUSCULAR | Status: DC | PRN

## 2016-12-13 MED ORDER — DOXAZOSIN MESYLATE 4 MG PO TABS
8.0000 mg | ORAL_TABLET | Freq: Every day | ORAL | Status: DC
  Administered 2016-12-13: 8 mg via ORAL
  Filled 2016-12-13: qty 2

## 2016-12-13 NOTE — Discharge Summary (Signed)
Physician Discharge Summary  Hunter Hardin. HAL:[id number] DOB: [birth year] DOA: 12/12/2016  PCP: Elsie Stain, MD  Admit date: 12/12/2016 Discharge date: 12/13/2016  Admitted From: Home Disposition:  Home   Recommendations for Outpatient Follow-up:  1. Follow up with PCP in 1-2 weeks 2. Keep previously scheduled appointment with oncology 3. Can set up an appointment with Cardiothoracic Surgery if wanted 4. 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 Denies any cough. Last pleural effusion drainage 10 days ago by IRdone at [facility]  Patient known history of renal cell carcinoma oral chemotherapy agent. The review of records his primary care provider was discussing possibility of hospice at this point he is not ready to transition  Regarding pertinent Chronic problems: History of hypertension maintain on hydrochlorothiazide and  Examination his history of leg ulcers followed by wound carehas been wraped on thurday family states the wounds have been healing. He has chronic lower extremity swelling for which she's been taking Lasix.  Patient underwent thoracentesis on 12/24 with drainage of 1.1L.  Patient and family voice lots of questions about whether or not he is appropriate for a more permanent catheter placement like a Pleurx.  Patient lung unable to expand after  thoracenteses due to rind lind material on it from previous fluid.  Discussed with interventional radiologist on call whom suggested discussing with cardiothoracic surgery to see if patient is a candidate for VATs procedure.  Did discuss whether patient would be a candidate for VATs and given his underlying diagnosis and comorbidities patient is not a candidate for that aggressive of an intervention.  CTS on call voiced he was not sure if patient would be a good candidate for Pleurx placement given the risk of possibly introducing infection resulting in empyema.  Discussed this with patient and family. Patient magnesium was low and he was instructed to take magnesium supplements outpatient.  Discharge Diagnoses:  Active Problems:   Hypothyroidism   Essential hypertension   Colon cancer (HCC)   Renal cell cancer (HCC)   Protein calorie malnutrition ([place])   Pleural effusion   Dyspnea    Discharge Instructions  Discharge Instructions    Activity as tolerated - No restrictions    Complete by:  As directed    Call MD for:  difficulty breathing, headache or visual disturbances    Complete by:  As directed    Call MD for:  extreme fatigue    Complete by:  As directed    Call MD for:  hives    Complete by:  As directed    Call MD for:  persistant dizziness or light-headedness    Complete by:  As directed    Call MD for:  persistant nausea and vomiting    Complete by:  As directed    Call MD for:  severe uncontrolled pain    Complete by:  As directed    Call MD for:  temperature >100.4    Complete by:  As directed    Diet general    Complete by:  As directed    Discharge instructions    Complete by:  As directed    Please return to hospital with any increased dyspnea or chest pressure   Increase activity slowly    Complete by:  As directed      Allergies as of 12/13/2016   No Known Allergies     Medication List    TAKE these medications   benzonatate 200 MG capsule Commonly  known as:  TESSALON Take 1 capsule (200 mg total) by mouth 3 (three) times daily as needed for cough.   cabozantinib S-Malate 20 MG Tabs Commonly known as:  CABOMETYX Take 1 tablet by mouth daily.   doxazosin 8 MG tablet Commonly known as:  CARDURA TAKE 1 TABLET DAILY   finasteride 5 MG tablet Commonly known as:  PROSCAR Take 5 mg by mouth daily.   fish oil-omega-3 fatty acids 1000 MG capsule Take 1 g by mouth 2 (two) times daily.   furosemide 20 MG tablet Commonly known as:  LASIX Take 1 tablet (20 mg total) by mouth daily as needed for fluid.   KLOR-CON M20 20 MEQ tablet Generic drug:  potassium chloride SA TAKE 1 TABLET TWICE A DAY   lactose free nutrition Liqd Take 237 mLs by mouth daily.   levothyroxine 50 MCG tablet Commonly known as:  SYNTHROID, LEVOTHROID TAKE 2 TABLETS ON SUNDAY AND 1 TABLET ON ALL OTHER DAYS (8 TABLETS A WEEK TOTAL)   loperamide 2 MG tablet Commonly known as:  IMODIUM A-D Take 2 mg by mouth as needed for diarrhea or loose stools.   magnesium chloride 64 MG Tbec SR tablet Commonly known as:  SLOW-MAG Take 2 tablets (128 mg total) by mouth daily.   ondansetron 8 MG tablet Commonly known as:  ZOFRAN Take 1 tablet (8 mg total) by mouth every 8 (eight) hours as needed. for nausea   propranolol 40 MG tablet Commonly known as:  INDERAL Take 0.5 tablets (20 mg total) by mouth 2 (two) times daily.   traMADol 50 MG tablet Commonly known as:  ULTRAM Take 1 tablet (50 mg total) by mouth every 8 (eight) hours as needed. What changed:  reasons to take this   verapamil 240 MG CR tablet Commonly known as:  CALAN-SR TAKE 1 TABLET DAILY   vitamin C 500 MG tablet Commonly known as:  ASCORBIC ACID Take 500 mg by mouth daily.      Follow-up Information    Elsie Stain, MD. Schedule an appointment as soon as possible for a visit in 1 week(s).   Specialty:  Family Medicine Contact information: [place]  [address] [phone]        [facility], MD. Daphane Shepherd on 12/23/2016.   Specialty:  Oncology Contact information: [place] [address] [phone]          No Known Allergies  Consultations:  Radiology  Phone discussion with Cardiothoracic Surgery   Procedures/Studies: Ct Abdomen Pelvis W Wo Contrast  Result Date: 11/24/2016 CLINICAL DATA:  Follow-up metastatic renal cancer with lung metastases, history of colon resection, chemotherapy ongoing. Status post left thoracentesis on 10/31/2016. Shortness of breath. EXAM: CT CHEST WITH CONTRAST CT ABDOMEN AND PELVIS WITH AND WITHOUT CONTRAST TECHNIQUE: Multidetector CT imaging of the chest was performed during intravenous contrast administration. Multidetector CT imaging of the abdomen and pelvis was performed following the standard protocol before and during bolus administration of intravenous contrast. CONTRAST:  160m ISOVUE-300 IOPAMIDOL (ISOVUE-300) INJECTION 61% COMPARISON:  08/11/2016 FINDINGS: CT CHEST FINDINGS Cardiovascular: The heart is normal in size. No pericardial effusion. Three vessel coronary atherosclerosis. Atherosclerotic calcifications of the aortic arch. Mediastinum/Nodes: 11 mm short axis subcarinal node (series 7/ image 47), unchanged. 2.2 x 3.0 cm left pericardial mass (series 11/image 84), previously 2.4 x 3.2 cm, stable versus mildly decreased. Visualized thyroid is unremarkable. Lungs/Pleura: Large left pleural effusion with pleural thickening, malignant. Associated compressive atelectasis of the left upper and lower lobes. Underlying 2.9 x 4.3 cm left lower lobe mass (series 11/ image 65), previously 2.6 x 4.6 cm, grossly unchanged. Small right pleural effusion. Mild dependent atelectasis in the right lower lobe. Calcified granuloma in the right middle lobe (series 15/ image 34). No suspicious pulmonary nodules. No pneumothorax. Musculoskeletal: Degenerative changes of the thoracic spine. Metastasis with  pathologic fracture involving the acromion of the left scapula (series 11/ image 7). Metastasis pathologic fracture involving the left posterior 8th rib (series 11/image 52), new. Metastasis with pathologic fracture involving the left posterior 12th rib (series 11/image 101), new. CT ABDOMEN AND PELVIS FINDINGS Hepatobiliary: Liver is notable for mild intrahepatic ductal dilatation. Status post cholecystectomy. No intrahepatic or extrahepatic ductal dilatation. Pancreas: 5 mm hypoenhancing lesion in the pancreatic head/ uncinate process (series 11/ image 114), likely reflecting a benign pancreatic cyst versus invagination of benign extra pancreatic fat. Spleen: Within normal limits. Adrenals/Urinary Tract: Adrenal glands are within normal limits. Dominant lateral upper pole renal mass measures 5.7 x 4.4 x 5.2 cm (series 11/ image 119), previously 5.9 x 4.3 x 5.6 cm, grossly unchanged. Adjacent 2.0 cm enhancing mural nodule along the medial aspect of the lesion and typically in the upper pole of the kidney (series 11/ image 122), previously 1.7 cm. Right kidney is within normal limits.  No hydronephrosis. Mildly thick-walled bladder with indwelling Foley catheter. Stomach/Bowel: Stomach is within normal limits. No evidence of bowel obstruction. Status post right hemicolectomy with appendectomy. Moderate left colonic stool burden. Vascular/Lymphatic: No evidence of abdominal aortic aneurysm. Atherosclerotic calcifications of the abdominal aorta and branch vessels. No suspicious abdominopelvic lymphadenopathy. Reproductive: Prostatomegaly with dystrophic calcifications. Other: Large left inguinal/ scrotal hernia containing fat and nondilated sigmoid colon (series 11/ image 200), unchanged. Trace pelvic ascites (series 11/ image 173). Musculoskeletal: Degenerative changes of the thoracic spine. Sclerotic lesion/bone island in the right posterior iliac bone (series 11/ image 145). IMPRESSION: 5.7 cm left upper pole renal  cell carcinoma with associated 2.0 cm enhancing mural nodule, grossly unchanged. 3.0 cm left pericardial mass and 4.3 cm left lower lobe mass, grossly unchanged. Large left pleural effusion with associated pleural thickening, malignant, grossly unchanged. 11 mm short axis subcarinal node, suspicious for metastasis, grossly unchanged. Osseous metastasis with pathologic fracture involving the left acromion, unchanged. New osseous metastases with pathologic fracture involving the left posterior 8th and 12th ribs. Additional ancillary findings as above. Electronically Signed   By: Julian Hy M.D.   On: 11/24/2016 14:19   Dg Chest 1 View  Result Date: 12/13/2016 CLINICAL DATA:  Status post left thoracentesis today. EXAM: CHEST 1 VIEW COMPARISON:  PA and lateral chest 12/12/2016. Single-view of the chest 12/02/2016. FINDINGS: Large left pleural effusion persists with an air-fluid level consistent with hydropneumothorax due to chronically consolidated left lung which will not expanded. Size of the pneumothorax component is slightly decreased since the most recent exam. Interstitial opacities throughout the right chest are unchanged. IMPRESSION: Persistent large left pleural effusion. Left hydropneumothorax due to chronically consolidated and trapped left lung. The left lung is slightly  better expanded than on the most recent exam. Electronically Signed   By: Inge Rise M.D.   On: 12/13/2016 11:47   Dg Chest 1 View  Result Date: 12/02/2016 CLINICAL DATA:  Status post left thoracentesis EXAM: CHEST 1 VIEW COMPARISON:  11/06/2016, 12/02/2016 FINDINGS: Cardiac shadow is within normal limits. The right lung is clear with chronic interstitial markings. There are changes consistent with hydropneumothorax on the left following thoracentesis. There remains a significant amount of fluid. The hydropneumothorax is results of consolidated lung which has not re- inflated following thoracentesis. This does not  represent a true pneumothorax and no treatment is necessary at this time. The space wall either refill with fluid or the lung will expand somewhat fill the residual pleural space. No bony abnormality is noted. IMPRESSION: Status post left thoracentesis with a considerable amount of retained fluid. The left lung has not re-expanded into the pleural space due to its relatively consolidated appearance on recent CT examination and underlying metastatic lesion within the left lung. This creates the appearance of the pneumothorax although this does not represent a true pneumothorax and no therapy is indicated. The space will refill with fluid or the lung re-expand into the pleural space. Electronically Signed   By: Inez Catalina M.D.   On: 12/02/2016 13:58   Dg Chest 2 View  Result Date: 12/12/2016 CLINICAL DATA:  History of recent thoracentesis with increasing chest pain and shortness of breath, history of known renal cell carcinoma with lung metastasis EXAM: CHEST  2 VIEW COMPARISON:  12/02/2016 FINDINGS: There is increasing pleural effusion identified on the left when compared with the prior exam. The previously seen hydropneumothorax has now almost completely resolved with only a small apical component noted. This does not represent a true pneumothorax but rather incomplete re-expansion of the left lung following previous thoracentesis. The space has nearly completely filled with fluid. Right lung again demonstrates some patchy fibrotic changes stable from the prior study. No acute infiltrate is seen. IMPRESSION: Increasing left-sided pleural effusion with nearly completely collapsed left lung secondary to underlying neoplasm and wet lung. These results were called by telephone at the time of interpretation on 12/12/2016 at 7:23 Pm to Dr. Brantley Stage , who verbally acknowledged these results. Electronically Signed   By: Inez Catalina M.D.   On: 12/12/2016 19:23   Ct Chest W Contrast  Result Date:  11/24/2016 CLINICAL DATA:  Follow-up metastatic renal cancer with lung metastases, history of colon resection, chemotherapy ongoing. Status post left thoracentesis on 10/31/2016. Shortness of breath. EXAM: CT CHEST WITH CONTRAST CT ABDOMEN AND PELVIS WITH AND WITHOUT CONTRAST TECHNIQUE: Multidetector CT imaging of the chest was performed during intravenous contrast administration. Multidetector CT imaging of the abdomen and pelvis was performed following the standard protocol before and during bolus administration of intravenous contrast. CONTRAST:  133m ISOVUE-300 IOPAMIDOL (ISOVUE-300) INJECTION 61% COMPARISON:  08/11/2016 FINDINGS: CT CHEST FINDINGS Cardiovascular: The heart is normal in size. No pericardial effusion. Three vessel coronary atherosclerosis. Atherosclerotic calcifications of the aortic arch. Mediastinum/Nodes: 11 mm short axis subcarinal node (series 7/ image 47), unchanged. 2.2 x 3.0 cm left pericardial mass (series 11/image 84), previously 2.4 x 3.2 cm, stable versus mildly decreased. Visualized thyroid is unremarkable. Lungs/Pleura: Large left pleural effusion with pleural thickening, malignant. Associated compressive atelectasis of the left upper and lower lobes. Underlying 2.9 x 4.3 cm left lower lobe mass (series 11/ image 65), previously 2.6 x 4.6 cm, grossly unchanged. Small right pleural effusion. Mild dependent atelectasis in the  right lower lobe. Calcified granuloma in the right middle lobe (series 15/ image 34). No suspicious pulmonary nodules. No pneumothorax. Musculoskeletal: Degenerative changes of the thoracic spine. Metastasis with pathologic fracture involving the acromion of the left scapula (series 11/ image 7). Metastasis pathologic fracture involving the left posterior 8th rib (series 11/image 52), new. Metastasis with pathologic fracture involving the left posterior 12th rib (series 11/image 101), new. CT ABDOMEN AND PELVIS FINDINGS Hepatobiliary: Liver is notable for mild  intrahepatic ductal dilatation. Status post cholecystectomy. No intrahepatic or extrahepatic ductal dilatation. Pancreas: 5 mm hypoenhancing lesion in the pancreatic head/ uncinate process (series 11/ image 114), likely reflecting a benign pancreatic cyst versus invagination of benign extra pancreatic fat. Spleen: Within normal limits. Adrenals/Urinary Tract: Adrenal glands are within normal limits. Dominant lateral upper pole renal mass measures 5.7 x 4.4 x 5.2 cm (series 11/ image 119), previously 5.9 x 4.3 x 5.6 cm, grossly unchanged. Adjacent 2.0 cm enhancing mural nodule along the medial aspect of the lesion and typically in the upper pole of the kidney (series 11/ image 122), previously 1.7 cm. Right kidney is within normal limits.  No hydronephrosis. Mildly thick-walled bladder with indwelling Foley catheter. Stomach/Bowel: Stomach is within normal limits. No evidence of bowel obstruction. Status post right hemicolectomy with appendectomy. Moderate left colonic stool burden. Vascular/Lymphatic: No evidence of abdominal aortic aneurysm. Atherosclerotic calcifications of the abdominal aorta and branch vessels. No suspicious abdominopelvic lymphadenopathy. Reproductive: Prostatomegaly with dystrophic calcifications. Other: Large left inguinal/ scrotal hernia containing fat and nondilated sigmoid colon (series 11/ image 200), unchanged. Trace pelvic ascites (series 11/ image 173). Musculoskeletal: Degenerative changes of the thoracic spine. Sclerotic lesion/bone island in the right posterior iliac bone (series 11/ image 145). IMPRESSION: 5.7 cm left upper pole renal cell carcinoma with associated 2.0 cm enhancing mural nodule, grossly unchanged. 3.0 cm left pericardial mass and 4.3 cm left lower lobe mass, grossly unchanged. Large left pleural effusion with associated pleural thickening, malignant, grossly unchanged. 11 mm short axis subcarinal node, suspicious for metastasis, grossly unchanged. Osseous  metastasis with pathologic fracture involving the left acromion, unchanged. New osseous metastases with pathologic fracture involving the left posterior 8th and 12th ribs. Additional ancillary findings as above. Electronically Signed   By: Julian Hy M.D.   On: 11/24/2016 14:19   US Thoracentesis Asp Pleural Space W/img Guide  Result Date: 12/02/2016 INDICATION: Left-sided pleural effusion with history of renal cell carcinoma and metastatic lung disease EXAM: ULTRASOUND GUIDED left THORACENTESIS MEDICATIONS: None. COMPLICATIONS: None immediate. PROCEDURE: An ultrasound guided thoracentesis was thoroughly discussed with the patient and questions answered. The benefits, risks, alternatives and complications were also discussed. The patient understands and wishes to proceed with the procedure. Written consent was obtained. Ultrasound was performed to localize and mark an adequate pocket of fluid in the left chest. The area was then prepped and draped in the normal sterile fashion. 1% Lidocaine was used for local anesthesia. Under ultrasound guidance a 6 Fr Safe-T-Centesis catheter was introduced. Thoracentesis was performed. The catheter was removed and a dressing applied. FINDINGS: A total of approximately 2 L of dark yellow fluid was removed. IMPRESSION: Successful ultrasound guided left thoracentesis yielding 2 L of pleural fluid. Electronically Signed   By: Inez Catalina M.D.   On: 12/02/2016 14:03      Subjective: Patient reports improvement in pressure and work of breathing with thoracentesis.  Has appointments already scheduled with oncology and wound care.  Understands that lung expansion will probably not happen even with  repeated thoracentesis.  Discharge Exam: Vitals:   12/13/16 1637 12/13/16 1801  BP: 101/61 108/63  Pulse: (!) 55 (!) 57  Resp:    Temp:     Vitals:   12/13/16 0558 12/13/16 1404 12/13/16 1637 12/13/16 1801  BP: 116/67 95/60 101/61 108/63  Pulse: (!) 57 (!) 53 (!)  55 (!) 57  Resp: 13 18    Temp: 97.5 F (36.4 C)     TempSrc: Axillary     SpO2: 96% 98%  98%  Weight:      Height:        General: Pt is alert, awake, not in acute distress Cardiovascular: RRR, S1/S2 +, no rubs, no gallops Respiratory: decreased breath sounds at left lung base Abdominal: Soft, NT, ND, bowel sounds + Extremities: no edema, no cyanosis, lower extremities in dressings    The results of significant diagnostics from this hospitalization (including imaging, microbiology, ancillary and laboratory) are listed below for reference.     Microbiology: No results found for this or any previous visit (from the past 240 hour(s)).   Labs: BNP (last 3 results) No results for input(s): BNP in the last 8760 hours. Basic Metabolic Panel:  Recent Labs Lab 12/12/16 1955 12/13/16 0110  NA 136 137  K 4.3 4.7  CL 103 103  CO2 26 30  GLUCOSE 79 86  BUN 25* 23*  CREATININE 1.17 1.18  CALCIUM 7.9* 8.2*  MG  --  1.2*  PHOS  --  3.1   Liver Function Tests:  Recent Labs Lab 12/13/16 0110  AST 21  ALT 21  ALKPHOS 76  BILITOT 1.0  PROT 6.1*  ALBUMIN 2.7*   No results for input(s): LIPASE, AMYLASE in the last 168 hours. No results for input(s): AMMONIA in the last 168 hours. CBC:  Recent Labs Lab 12/12/16 1955 12/13/16 0110  WBC 5.6 5.9  NEUTROABS 2.8  --   HGB 12.1* 12.9*  HCT 36.8* 39.1  MCV 100.3* 100.8*  PLT 144* 138*   Cardiac Enzymes:  Recent Labs Lab 12/13/16 0110 12/13/16 0639 12/13/16 1233  TROPONINI <0.03 <0.03 <0.03   BNP: Invalid input(s): POCBNP CBG: No results for input(s): GLUCAP in the last 168 hours. D-Dimer No results for input(s): DDIMER in the last 72 hours. Hgb A1c No results for input(s): HGBA1C in the last 72 hours. Lipid Profile No results for input(s): CHOL, HDL, LDLCALC, TRIG, CHOLHDL, LDLDIRECT in the last 72 hours. Thyroid function studies  Recent Labs  12/13/16 0110  TSH 43.498*   Anemia work up No results  for input(s): VITAMINB12, FOLATE, FERRITIN, TIBC, IRON, RETICCTPCT in the last 72 hours. Urinalysis No results found for: COLORURINE, APPEARANCEUR, LABSPEC, [place], GLUCOSEU, HGBUR, BILIRUBINUR, KETONESUR, PROTEINUR, UROBILINOGEN, NITRITE, LEUKOCYTESUR Sepsis Labs Invalid input(s): PROCALCITONIN,  WBC,  LACTICIDVEN Microbiology No results found for this or any previous visit (from the past 240 hour(s)).   Time coordinating discharge: Over 30 minutes  SIGNED:   Loretha Stapler, MD  [facility] 12/13/2016, 7:06 PM Pager [phone] If 7PM-7AM, please contact night-coverage [email or web address] Password [email or web address]

## 2016-12-13 NOTE — Progress Notes (Signed)
PROGRESS NOTE    [name].  XKG:[id number] DOB: [birth year] DOA: 12/12/2016 PCP: Elsie Stain, MD    Brief Narrative:  Hunter Hardin. is a 81 y.o. male with medical history significant of renal cell carcinoma with metastases to the lung with malignant recurrent left pleural effusion, Hypertension, history of colon cancer status post right hemicolectomy hypothyroidism, hypercholesteremia thrombocytopenia, protein malnutrition  Presented with onset sense this am of severe dyspnea, no chest pain. No fever no chills. He has chronic lower extremity edema with wounds. Denies any cough. Last pleural effusion drainage 10 days ago by IR done at [facility]    Patient known history of renal cell carcinoma oral chemotherapy agent. The review of records his primary care provider was discussing possibility of hospice at this point he is not ready to transition  Regarding pertinent Chronic problems: History of hypertension maintain on hydrochlorothiazide and  Examination his history of leg ulcers followed by wound care has been wraped on thurday family states the wounds have been healing. He has chronic lower extremity swelling for which she's been taking Lasix.   Assessment & Plan:   Active Problems:   Hypothyroidism   Essential hypertension   Colon cancer (HCC)   Renal cell cancer (HCC)   Protein calorie malnutrition (HCC)   Pleural effusion   Dyspnea   Dyspnea likely secondary to large left pleural effusion - cardiac enzymes negative x 3 - dyspnea stable at time of evaluation- patient breathing easily and not requiring oxygen - will follow up after thoracentesis - discussed with patient Pleurx catheter- he will discuss this with his doctor   Essential hypertension - stable - BP WNL   Protein calorie malnutrition ([place])  - continue supplementation - prealbumin pending - nutritional consult ordered    Renal cell cancer ([place]) - need to follow up with oncology as an  outpatient rate - overall poor prognosis - patient has required repeated procedures - needs to discuss overall goals of care with oncology   DVT prophylaxis:  SCD   Code Status:  FULL CODE  as per patient  Family Communication:   Family   at  Bedside  plan of care was discussed with   Son, and friend Disposition Plan:     To home once workup is complete and patient is stable    Consultants:   Interventional radiology  Procedures:   Thoracentesis 12/24  Antimicrobials:   None    Subjective: Patient seen and evaluated prior to procedure.  Anxious to get the fluid off his lung. Has never heard of a Pleurx catheter before.  Says he will discuss this with his doctor at his next appointment.  Objective: Vitals:   12/12/16 2117 12/12/16 2118 12/13/16 0034 12/13/16 0558  BP: 124/72  128/70 116/67  Pulse: 66  63 (!) 57  Resp: '18  13 13  '$ Temp:   97.4 F (36.3 C) 97.5 F (36.4 C)  TempSrc:   Axillary Axillary  SpO2: 95%  94% 96%  Weight:  59.9 kg (132 lb)    Height:  '5\' 11"'$  (1.803 m)      Intake/Output Summary (Last 24 hours) at 12/13/16 1223 Last data filed at 12/13/16 0559  Gross per 24 hour  Intake                0 ml  Output              475 ml  Net             -  475 ml   Filed Weights   12/12/16 2118  Weight: 59.9 kg (132 lb)    Examination:  General exam: Appears calm and comfortable, thin frail elderly man  Respiratory system:  Respiratory effort normal. Significantly decreased breath sounds on the left likely 2/2 pleural effusion Cardiovascular system: S1 & S2 heard, RRR. No JVD, murmurs, rubs, gallops or clicks. No pedal edema. Gastrointestinal system: Abdomen is nondistended, soft and nontender. No organomegaly or masses felt. Normal bowel sounds heard. Central nervous system: Alert and oriented. No focal neurological deficits. Extremities: Symmetric 5 x 5 power. Skin: No rashes, lesions or ulcers, bilateral lower extremities wrapped in dressings-  patient says he is seen by wound care outpatient and would rather they not be removed until that appointment Psychiatry: Judgement and insight appear normal. Mood & affect appropriate.     Data Reviewed: I have personally reviewed following labs and imaging studies  CBC:  Recent Labs Lab 12/12/16 1955 12/13/16 0110  WBC 5.6 5.9  NEUTROABS 2.8  --   HGB 12.1* 12.9*  HCT 36.8* 39.1  MCV 100.3* 100.8*  PLT 144* 979*   Basic Metabolic Panel:  Recent Labs Lab 12/12/16 1955 12/13/16 0110  NA 136 137  K 4.3 4.7  CL 103 103  CO2 26 30  GLUCOSE 79 86  BUN 25* 23*  CREATININE 1.17 1.18  CALCIUM 7.9* 8.2*  MG  --  1.2*  PHOS  --  3.1   GFR: Estimated Creatinine Clearance: 43 mL/min (by C-G formula based on SCr of 1.18 mg/dL). Liver Function Tests:  Recent Labs Lab 12/13/16 0110  AST 21  ALT 21  ALKPHOS 76  BILITOT 1.0  PROT 6.1*  ALBUMIN 2.7*   No results for input(s): LIPASE, AMYLASE in the last 168 hours. No results for input(s): AMMONIA in the last 168 hours. Coagulation Profile: No results for input(s): INR, PROTIME in the last 168 hours. Cardiac Enzymes:  Recent Labs Lab 12/13/16 0110 12/13/16 0639  TROPONINI <0.03 <0.03   BNP (last 3 results) No results for input(s): PROBNP in the last 8760 hours. HbA1C: No results for input(s): HGBA1C in the last 72 hours. CBG: No results for input(s): GLUCAP in the last 168 hours. Lipid Profile: No results for input(s): CHOL, HDL, LDLCALC, TRIG, CHOLHDL, LDLDIRECT in the last 72 hours. Thyroid Function Tests:  Recent Labs  12/13/16 0110  TSH 43.498*   Anemia Panel: No results for input(s): VITAMINB12, FOLATE, FERRITIN, TIBC, IRON, RETICCTPCT in the last 72 hours. Sepsis Labs: No results for input(s): PROCALCITON, LATICACIDVEN in the last 168 hours.  No results found for this or any previous visit (from the past 240 hour(s)).       Radiology Studies: Dg Chest 1 View  Result Date:  12/13/2016 CLINICAL DATA:  Status post left thoracentesis today. EXAM: CHEST 1 VIEW COMPARISON:  PA and lateral chest 12/12/2016. Single-view of the chest 12/02/2016. FINDINGS: Large left pleural effusion persists with an air-fluid level consistent with hydropneumothorax due to chronically consolidated left lung which will not expanded. Size of the pneumothorax component is slightly decreased since the most recent exam. Interstitial opacities throughout the right chest are unchanged. IMPRESSION: Persistent large left pleural effusion. Left hydropneumothorax due to chronically consolidated and trapped left lung. The left lung is slightly better expanded than on the most recent exam. Electronically Signed   By: Inge Rise M.D.   On: 12/13/2016 11:47   Dg Chest 2 View  Result Date: 12/12/2016 CLINICAL DATA:  History of  recent thoracentesis with increasing chest pain and shortness of breath, history of known renal cell carcinoma with lung metastasis EXAM: CHEST  2 VIEW COMPARISON:  12/02/2016 FINDINGS: There is increasing pleural effusion identified on the left when compared with the prior exam. The previously seen hydropneumothorax has now almost completely resolved with only a small apical component noted. This does not represent a true pneumothorax but rather incomplete re-expansion of the left lung following previous thoracentesis. The space has nearly completely filled with fluid. Right lung again demonstrates some patchy fibrotic changes stable from the prior study. No acute infiltrate is seen. IMPRESSION: Increasing left-sided pleural effusion with nearly completely collapsed left lung secondary to underlying neoplasm and wet lung. These results were called by telephone at the time of interpretation on 12/12/2016 at 7:23 Pm to Dr. Brantley Stage , who verbally acknowledged these results. Electronically Signed   By: Inez Catalina M.D.   On: 12/12/2016 19:23        Scheduled Meds: . cabozantinib  S-Malate  1 tablet Oral Daily  . doxazosin  8 mg Oral Daily  . finasteride  5 mg Oral Daily  . guaiFENesin  600 mg Oral BID  . lactose free nutrition  237 mL Oral TID BM  . levothyroxine  50 mcg Oral QAC breakfast  . multivitamin with minerals  1 tablet Oral Daily  . potassium chloride SA  20 mEq Oral BID  . propranolol  20 mg Oral BID  . sodium chloride flush  3 mL Intravenous Q12H  . sodium chloride flush  3 mL Intravenous Q12H  . verapamil  240 mg Oral Daily   Continuous Infusions:   LOS: 0 days    Time spent: 30 minutes    Loretha Stapler, MD [facility] Pager [phone]  If 7PM-7AM, please contact night-coverage [email or web address] Password [email or web address] 12/13/2016, 12:23 PM

## 2016-12-13 NOTE — Progress Notes (Signed)
Post thoracentesis, puncture site dressing clean d,ry and intact, patient no complaints of any discomfort.Family at bedside.

## 2016-12-13 NOTE — Procedures (Signed)
   US guided L thoracentesis 1.1 liter yellow fluid obtained  Pt tolerated well  Only discontinued because BP was running in 70-80s Although pt asymptomatic; rechecked on 2 monitors; ----could not ignore low BP  Can do another thoracentesis at later date if needed

## 2016-12-13 NOTE — Progress Notes (Signed)
Initial Nutrition Assessment  DOCUMENTATION CODES:   Underweight  INTERVENTION:   D/c Ensure  Provide Boost supplements TID, each provides 240 kcal and 10g protein Provide Multivitamin with minerals daily RD will continue to monitor  NUTRITION DIAGNOSIS:   Increased nutrient needs related to cancer and cancer related treatments, wound healing as evidenced by estimated needs.  GOAL:   Patient will meet greater than or equal to 90% of their needs  MONITOR:   PO intake, Supplement acceptance, Labs, Weight trends, I & O's, Skin  REASON FOR ASSESSMENT:   Consult Assessment of nutrition requirement/status  ASSESSMENT:   81 y.o. male with medical history significant of renal cell carcinoma with metastases to the lung with malignant recurrent left pleural effusion, Hypertension, history of colon cancer status post right hemicolectomy hypothyroidism, hypercholesteremia thrombocytopenia, protein malnutrition  Patient not in room at time of visit. Per RN, pt having ultrasound. Pt has been eating fair, ate some eggs this morning and is drinking his Boost drinks. However, he is only consuming 1 Boost drink and not receiving Ensure supplements. Will d/c Ensure supplements and increase order of Boost supplements. Will also order multivitamin to supplement diet as well and support wound healing.  Per chart review, pt has lost 10 lb since 11/3 (7% wt loss x <2 months, significant for time frame). Not able to perform NFPE as patient was not in room.  Medications: K-DUR tablet BID Labs reviewed: Low Mg Phos/K WNL  Diet Order:  Diet regular Room service appropriate? Yes; Fluid consistency: Thin  Skin:  Wound (see comment) (Stage 2 sacral wound)  Last BM:  12/23  Height:   Ht Readings from Last 1 Encounters:  12/12/16 '5\' 11"'$  (1.803 m)    Weight:   Wt Readings from Last 1 Encounters:  12/12/16 132 lb (59.9 kg)    Ideal Body Weight:  78.2 kg  BMI:  Body mass index is 18.41  kg/m.  Estimated Nutritional Needs:   Kcal:  1800-2000  Protein:  90-100g  Fluid:  2L/day  EDUCATION NEEDS:   No education needs identified at this time  Clayton Bibles, MS, RD, LDN Pager: [phone] After Hours Pager: [phone]

## 2016-12-13 NOTE — Progress Notes (Signed)
Pt and family made aware to bring chemo med- cabozantinib s- malate  Per pharmacy does not carry this med.

## 2016-12-13 NOTE — Progress Notes (Signed)
Patient and family refused to have lower bil extremity dressing (uno boots) removed . Patient is treated by Wound center - Dr. Con Memos  @ [facility])  Last dsg changed on Thursday. Bil Dsg are currentlyCDI.   Sacrum noted to be red with a small stage 2 to the mid sacrum - Clean and dried. Avvelyn- coccyx dsg applied . Will cont to monitor

## 2016-12-15 DIAGNOSIS — I890 Lymphedema, not elsewhere classified: Secondary | ICD-10-CM | POA: Diagnosis not present

## 2016-12-15 DIAGNOSIS — R338 Other retention of urine: Secondary | ICD-10-CM | POA: Diagnosis not present

## 2016-12-15 DIAGNOSIS — I10 Essential (primary) hypertension: Secondary | ICD-10-CM | POA: Diagnosis not present

## 2016-12-15 DIAGNOSIS — L97321 Non-pressure chronic ulcer of left ankle limited to breakdown of skin: Secondary | ICD-10-CM | POA: Diagnosis not present

## 2016-12-15 DIAGNOSIS — L97211 Non-pressure chronic ulcer of right calf limited to breakdown of skin: Secondary | ICD-10-CM | POA: Diagnosis not present

## 2016-12-15 DIAGNOSIS — L97311 Non-pressure chronic ulcer of right ankle limited to breakdown of skin: Secondary | ICD-10-CM | POA: Diagnosis not present

## 2016-12-15 DIAGNOSIS — L139 Bullous disorder, unspecified: Secondary | ICD-10-CM | POA: Diagnosis not present

## 2016-12-16 ENCOUNTER — Ambulatory Visit: Payer: Medicare Other | Admitting: Family Medicine

## 2016-12-16 ENCOUNTER — Other Ambulatory Visit: Payer: Self-pay | Admitting: Family Medicine

## 2016-12-17 ENCOUNTER — Encounter: Payer: Self-pay | Admitting: Family Medicine

## 2016-12-17 ENCOUNTER — Ambulatory Visit (INDEPENDENT_AMBULATORY_CARE_PROVIDER_SITE_OTHER): Payer: Medicare Other | Admitting: Family Medicine

## 2016-12-17 ENCOUNTER — Encounter: Payer: Medicare Other | Admitting: Surgery

## 2016-12-17 VITALS — BP 128/66 | HR 64 | Temp 98.2°F | Wt 125.5 lb

## 2016-12-17 DIAGNOSIS — L97811 Non-pressure chronic ulcer of other part of right lower leg limited to breakdown of skin: Secondary | ICD-10-CM | POA: Diagnosis not present

## 2016-12-17 DIAGNOSIS — C649 Malignant neoplasm of unspecified kidney, except renal pelvis: Secondary | ICD-10-CM | POA: Diagnosis not present

## 2016-12-17 DIAGNOSIS — R634 Abnormal weight loss: Secondary | ICD-10-CM | POA: Diagnosis not present

## 2016-12-17 DIAGNOSIS — L97211 Non-pressure chronic ulcer of right calf limited to breakdown of skin: Secondary | ICD-10-CM | POA: Diagnosis not present

## 2016-12-17 DIAGNOSIS — L97321 Non-pressure chronic ulcer of left ankle limited to breakdown of skin: Secondary | ICD-10-CM | POA: Diagnosis not present

## 2016-12-17 DIAGNOSIS — J90 Pleural effusion, not elsewhere classified: Secondary | ICD-10-CM | POA: Diagnosis not present

## 2016-12-17 DIAGNOSIS — L98491 Non-pressure chronic ulcer of skin of other sites limited to breakdown of skin: Secondary | ICD-10-CM

## 2016-12-17 DIAGNOSIS — M79604 Pain in right leg: Secondary | ICD-10-CM | POA: Diagnosis not present

## 2016-12-17 NOTE — Patient Instructions (Addendum)
Can take tramadol every 6 hours as needed for pain I have put in a referral to hospice and you should get a call in the next 2 business days about an evaluation.  Try to eat every couple of hours, high calorie, high protein.

## 2016-12-17 NOTE — Progress Notes (Signed)
Subjective:    Patient ID: Hunter Hardin., male    DOB: 1935/09/08, 81 y.o.   MRN: [id number]  HPI This a 81 yo male, accompanied by his cousin's wife and his daughter, who presents today for follow up of recent hospitalization.   His cousin's wife tries to go to all appointments and help him keep information organized. His daughter lives out of town but is trying to get FMLA to come help him for two weeks. The patient lives alone but has his cousin and his cousin's wife checking on him. His son is getting him a Building services engineer.   He was in the hospital 12/12/16- 12/13/16 with recurrent left plural effusion secondary to metastatic renal cell carcinoma. He presented with acute dyspnea and had thoracentesis of 1.1 liters. Has been taking tramadol with moderate relief of pain. He reports that his breathing is stable, he no longer feels like he has a weight on his chest. No pain at thoracentesis site. No fever or cough.   He has severe lower extremity edema and three wounds and is followed by home health weekly and is going to wound center. He was seen at wound center today to have legs re wrapped. Currently very sore following treatment.   He has poor appetite. No abdominal pain, nausea or vomiting.   Past Medical History:  Diagnosis Date  . Colon cancer ([place]) dx'd 01/2013  . Elevated PSA    H/O  . GERD (gastroesophageal reflux disease)   . HLD (hyperlipidemia)   . Hypertension   . Hypothyroidism   . Metastasis to lung ([place]) dx'd 01/2013  . Pleural effusion   . Protein calorie malnutrition ([place]) 06/13/2013  . renal ca dx'd 01/2013   Past Surgical History:  Procedure Laterality Date  . CHOLECYSTECTOMY  early 44's  . COLONOSCOPY  01/2013  . LAPAROSCOPIC PARTIAL COLECTOMY N/A 03/06/2013   Procedure: LAPAROSCOPic assisted right hemi COLECTOMY;  Surgeon: Leighton Ruff, MD;  Location: [facility]S;  Service: General;  Laterality: N/A;  . VASECTOMY     Family History  Problem Relation Age of Onset   . Hypertension Mother   . Hyperlipidemia Mother   . Dementia Mother   . Heart disease Father     ? MI, CAD  . Cancer Brother     oral/tonsil cancer 2012  . Cancer Sister     breast cancer  . Diabetes Cousin   . Colon cancer Neg Hx   . Prostate cancer Neg Hx    Social History  Substance Use Topics  . Smoking status: Former Smoker    Packs/day: 2.00    Years: 30.00    Types: Cigarettes    Quit date: 12/21/1985  . Smokeless tobacco: Never Used  . Alcohol use 0.0 oz/week     Comment: beer occassionally      Review of Systems Per hPI    Objective:   Physical Exam  Constitutional: He is oriented to person, place, and time. No distress.  Thin, frail, sitting in wheelchair.   HENT:  Head: Normocephalic and atraumatic.  Eyes: Conjunctivae are normal.  Cardiovascular: Normal rate, regular rhythm and normal heart sounds.   Pulmonary/Chest: Effort normal. No respiratory distress. He has no wheezes. He has no rales.  Neurological: He is alert and oriented to person, place, and time.  Skin: Skin is warm and dry. He is not diaphoretic.  Bilateral lower extremities wrapped from knees down.   Psychiatric: He has a normal mood and affect. His  behavior is normal. Judgment and thought content normal.  Vitals reviewed.     BP 128/66   Pulse 64   Temp 98.2 F (36.8 C) (Oral)   Wt 125 lb 8 oz (56.9 kg)   SpO2 95%   BMI 17.50 kg/m  Wt Readings from Last 3 Encounters:  12/17/16 125 lb 8 oz (56.9 kg)  12/12/16 132 lb (59.9 kg)  12/01/16 132 lb 8 oz (60.1 kg)       Assessment & Plan:  1. Renal cell carcinoma, unspecified laterality ([place]) - discussed Hospice referral with patient and his family and they were agreeable - Ambulatory referral to Hospice - can use tramadol q 6 hours as needed for pain and will notify office if pain is not manageable, discussed potential for constipation  - follow up as scheduled with oncology next week  2. Skin ulcer, limited to breakdown of  skin ([place]) - he will continue to be followed by wound care - Ambulatory referral to Hospice  3. Pleural effusion - RTC/ER if worsening dyspnea - Ambulatory referral to Hospice  4. Loss of weight - encouraged small frequent, high calorie, high protein meals and snacks, offered suggestions   Clarene Reamer, FNP-BC  [facility], [place]  12/19/2016 7:42 AM

## 2016-12-18 ENCOUNTER — Encounter: Payer: Self-pay | Admitting: *Deleted

## 2016-12-18 DIAGNOSIS — I10 Essential (primary) hypertension: Secondary | ICD-10-CM | POA: Diagnosis not present

## 2016-12-18 DIAGNOSIS — E039 Hypothyroidism, unspecified: Secondary | ICD-10-CM | POA: Diagnosis not present

## 2016-12-18 DIAGNOSIS — J910 Malignant pleural effusion: Secondary | ICD-10-CM | POA: Diagnosis not present

## 2016-12-18 DIAGNOSIS — C189 Malignant neoplasm of colon, unspecified: Secondary | ICD-10-CM | POA: Diagnosis not present

## 2016-12-18 DIAGNOSIS — C649 Malignant neoplasm of unspecified kidney, except renal pelvis: Secondary | ICD-10-CM | POA: Diagnosis not present

## 2016-12-18 DIAGNOSIS — N401 Enlarged prostate with lower urinary tract symptoms: Secondary | ICD-10-CM | POA: Diagnosis not present

## 2016-12-18 DIAGNOSIS — E785 Hyperlipidemia, unspecified: Secondary | ICD-10-CM | POA: Diagnosis not present

## 2016-12-18 DIAGNOSIS — R630 Anorexia: Secondary | ICD-10-CM | POA: Diagnosis not present

## 2016-12-18 DIAGNOSIS — C7800 Secondary malignant neoplasm of unspecified lung: Secondary | ICD-10-CM | POA: Diagnosis not present

## 2016-12-18 DIAGNOSIS — K219 Gastro-esophageal reflux disease without esophagitis: Secondary | ICD-10-CM | POA: Diagnosis not present

## 2016-12-18 DIAGNOSIS — R634 Abnormal weight loss: Secondary | ICD-10-CM | POA: Diagnosis not present

## 2016-12-18 DIAGNOSIS — R1310 Dysphagia, unspecified: Secondary | ICD-10-CM | POA: Diagnosis not present

## 2016-12-18 NOTE — Progress Notes (Signed)
SALAH, NAKAMURA ([id number]) Visit Report for 12/17/2016 Arrival Information Details Patient Name: Hunter Hardin, Hunter Hardin. Date of Service: 12/17/2016 9:45 AM Medical Record Number: [id number] Patient Account Number: [id number] Date of Birth/Sex: [birth year] (81 y.o. Male) Treating RN: Hunter Hardin Gouty, RN, BSN, Velva Harman Primary Care Physician: Elsie Stain Other Clinician: Referring Physician: Elsie Stain Treating Physician/Extender: Frann Rider in Treatment: 3 Visit Information History Since Last Visit All ordered tests and consults were completed: No Patient Arrived: Wheel Chair Added or deleted any medications: No Arrival Time: 09:34 Any new allergies or adverse reactions: No Accompanied By: sister Had a fall or experienced change in No activities of daily living that may affect Transfer Assistance: None risk of falls: Patient Identification Verified: Yes Signs or symptoms of abuse/neglect since last No Secondary Verification Process Yes visito Completed: Hospitalized since last visit: No Patient Requires Transmission-Based No Has Dressing in Place as Prescribed: Yes Precautions: Pain Present Now: No Patient Has Alerts: Yes Electronic Signature(s) Signed: 12/17/2016 4:00:14 PM By: Regan Lemming BSN, RN Entered By: Regan Lemming on 12/17/2016 09:35:20 Hunter Hardin ([id number]) -------------------------------------------------------------------------------- Clinic Level of Care Assessment Details Patient Name: Hunter Hardin. Date of Service: 12/17/2016 9:45 AM Medical Record Number: [id number] Patient Account Number: [id number] Date of Birth/Sex: [birth year] (81 y.o. Male) Treating RN: Hunter Hardin Gouty, RN, BSN, [place] Primary Care Physician: Elsie Stain Other Clinician: Referring Physician: Elsie Stain Treating Physician/Extender: Frann Rider in Treatment: 3 Clinic Level of Care Assessment Items TOOL 4 Quantity Score '[]'$  - Use when only an EandM is performed on  FOLLOW-UP visit 0 ASSESSMENTS - Nursing Assessment / Reassessment X - Reassessment of Co-morbidities (includes updates in patient status) 1 10 X - Reassessment of Adherence to Treatment Plan 1 5 ASSESSMENTS - Wound and Skin Assessment / Reassessment '[]'$  - Simple Wound Assessment / Reassessment - one wound 0 X - Complex Wound Assessment / Reassessment - multiple wounds 3 5 '[]'$  - Dermatologic / Skin Assessment (not related to wound area) 0 ASSESSMENTS - Focused Assessment X - Circumferential Edema Measurements - multi extremities 3 5 '[]'$  - Nutritional Assessment / Counseling / Intervention 0 X - Lower Extremity Assessment (monofilament, tuning fork, pulses) 1 5 '[]'$  - Peripheral Arterial Disease Assessment (using hand held doppler) 0 ASSESSMENTS - Ostomy and/or Continence Assessment and Care '[]'$  - Incontinence Assessment and Management 0 '[]'$  - Ostomy Care Assessment and Management (repouching, etc.) 0 PROCESS - Coordination of Care X - Simple Patient / Family Education for ongoing care 1 15 '[]'$  - Complex (extensive) Patient / Family Education for ongoing care 0 X - Staff obtains Programmer, systems, Records, Test Results / Process Orders 1 10 '[]'$  - Staff telephones HHA, Nursing Homes / Clarify orders / etc 0 '[]'$  - Routine Transfer to another Facility (non-emergent condition) 0 Hunter Hardin, Hunter Hardin ([id number]) '[]'$  - Routine Hospital Admission (non-emergent condition) 0 '[]'$  - New Admissions / Biomedical engineer / Ordering NPWT, Apligraf, etc. 0 '[]'$  - Emergency Hospital Admission (emergent condition) 0 '[]'$  - Simple Discharge Coordination 0 '[]'$  - Complex (extensive) Discharge Coordination 0 PROCESS - Special Needs '[]'$  - Pediatric / Minor Patient Management 0 '[]'$  - Isolation Patient Management 0 '[]'$  - Hearing / Language / Visual special needs 0 '[]'$  - Assessment of Community assistance (transportation, D/C planning, etc.) 0 '[]'$  - Additional assistance / Altered mentation 0 '[]'$  - Support Surface(s) Assessment (bed,  cushion, seat, etc.) 0 INTERVENTIONS - Wound Cleansing / Measurement '[]'$  - Simple Wound Cleansing - one wound 0 X - Complex Wound Cleansing - multiple wounds  3 5 X - Wound Imaging (photographs - any number of wounds) 1 5 '[]'$  - Wound Tracing (instead of photographs) 0 '[]'$  - Simple Wound Measurement - one wound 0 X - Complex Wound Measurement - multiple wounds 3 5 INTERVENTIONS - Wound Dressings '[]'$  - Small Wound Dressing one or multiple wounds 0 X - Medium Wound Dressing one or multiple wounds 3 15 '[]'$  - Large Wound Dressing one or multiple wounds 0 '[]'$  - Application of Medications - topical 0 '[]'$  - Application of Medications - injection 0 INTERVENTIONS - Miscellaneous '[]'$  - External ear exam 0 Hunter Hardin, Hunter Hardin ([id number]) '[]'$  - Specimen Collection (cultures, biopsies, blood, body fluids, etc.) 0 '[]'$  - Specimen(s) / Culture(s) sent or taken to Lab for analysis 0 '[]'$  - Patient Transfer (multiple staff / Harrel Lemon Lift / Similar devices) 0 '[]'$  - Simple Staple / Suture removal (25 or less) 0 '[]'$  - Complex Staple / Suture removal (26 or more) 0 '[]'$  - Hypo / Hyperglycemic Management (close monitor of Blood Glucose) 0 '[]'$  - Ankle / Brachial Index (ABI) - do not check if billed separately 0 X - Vital Signs 1 5 Has the patient been seen at the hospital within the last three years: Yes Total Score: 160 Level Of Care: New/Established - Level 5 Electronic Signature(s) Signed: 12/17/2016 4:00:14 PM By: Regan Lemming BSN, RN Entered By: Regan Lemming on 12/17/2016 09:54:54 Hunter Hardin ([id number]) -------------------------------------------------------------------------------- Encounter Discharge Information Details Patient Name: Hunter Hardin. Date of Service: 12/17/2016 9:45 AM Medical Record Number: [id number] Patient Account Number: [id number] Date of Birth/Sex: 08/13/1935 (81 y.o. Male) Treating RN: Hunter Hardin Gouty, RN, BSN, Velva Harman Primary Care Physician: Elsie Stain Other Clinician: Referring Physician:  Elsie Stain Treating Physician/Extender: Frann Rider in Treatment: 3 Encounter Discharge Information Items Discharge Pain Level: 0 Discharge Condition: Stable Ambulatory Status: Wheelchair Discharge Destination: Home Private Transportation: Auto Accompanied By: sister Schedule Follow-up Appointment: No Medication Reconciliation completed and No provided to Patient/Care Jatia Musa: Clinical Summary of Care: Electronic Signature(s) Signed: 12/17/2016 4:00:14 PM By: Regan Lemming BSN, RN Previous Signature: 12/17/2016 10:05:11 AM Version By: Ruthine Dose Entered By: Regan Lemming on 12/17/2016 10:06:07 Hunter Hardin ([id number]) -------------------------------------------------------------------------------- Lower Extremity Assessment Details Patient Name: Hunter Hardin. Date of Service: 12/17/2016 9:45 AM Medical Record Number: [id number] Patient Account Number: [id number] Date of Birth/Sex: [birth year] (81 y.o. Male) Treating RN: Hunter Hardin Gouty, RN, BSN, Velva Harman Primary Care Physician: Elsie Stain Other Clinician: Referring Physician: Elsie Stain Treating Physician/Extender: Frann Rider in Treatment: 3 Edema Assessment Assessed: [Left: No] [Right: No] E[Left: dema] [Right: :] Calf Left: Right: Point of Measurement: 33 cm From Medial Instep 30.2 cm 29.2 cm Ankle Left: Right: Point of Measurement: 10 cm From Medial Instep 20.6 cm 20.2 cm Vascular Assessment Claudication: Claudication Assessment [Left:None] [Right:None] Pulses: Dorsalis Pedis Palpable: [Left:Yes] [Right:Yes] Posterior Tibial Extremity colors, hair growth, and conditions: Extremity Color: [Left:Mottled] [Right:Mottled] Hair Growth on Extremity: [Left:Yes] [Right:Yes] Temperature of Extremity: [Left:Warm] [Right:Warm] Capillary Refill: [Left:< 3 seconds] [Right:< 3 seconds] Electronic Signature(s) Signed: 12/17/2016 4:00:14 PM By: Regan Lemming BSN, RN Entered By: Regan Lemming on  12/17/2016 09:38:48 Hunter Hardin ([id number]) -------------------------------------------------------------------------------- Multi Wound Chart Details Patient Name: Hunter Hardin. Date of Service: 12/17/2016 9:45 AM Medical Record Number: [id number] Patient Account Number: [id number] Date of Birth/Sex: Feb 08, 1935 (81 y.o. Male) Treating RN: Hunter Hardin Gouty, RN, BSN, Velva Harman Primary Care Physician: Elsie Stain Other Clinician: Referring Physician: Elsie Stain Treating Physician/Extender: Frann Rider in Treatment: 3 Vital Signs Height(in): 67 Pulse(bpm): 84 Weight(lbs): 138 Blood  Pressure 113/63 (mmHg): Body Mass Index(BMI): 22 Temperature(F): Respiratory Rate 17 (breaths/min): Photos: [1:No Photos] [2:No Photos] [3:No Photos] Wound Location: [1:Right Lower Leg - Circumfernential] [2:Left Malleolus - Medial] [3:Left Malleolus - Lateral] Wounding Event: [1:Gradually Appeared] [2:Gradually Appeared] [3:Gradually Appeared] Primary Etiology: [1:Venous Leg Ulcer] [2:Venous Leg Ulcer] [3:Venous Leg Ulcer] Comorbid History: [1:Osteoarthritis, Received Chemotherapy] [2:Osteoarthritis, Received Chemotherapy] [3:Osteoarthritis, Received Chemotherapy] Date Acquired: [1:11/09/2016] [2:11/09/2016] [3:11/09/2016] Weeks of Treatment: [1:3] [2:3] [3:3] Wound Status: [1:Open] [2:Open] [3:Open] Clustered Wound: [1:Yes] [2:Yes] [3:No] Measurements L x W x D 13x7.5x0.1 [2:0.2x0.2x0.1] [3:1x1.4x0.1] (cm) Area (cm) : [1:76.576] [2:0.031] [3:1.1] Volume (cm) : [1:7.658] [2:0.003] [3:0.11] % Reduction in Area: [1:47.40%] [2:99.30%] [3:58.30%] % Reduction in Volume: 47.40% [2:99.30%] [3:58.30%] Classification: [1:Partial Thickness] [2:Partial Thickness] [3:Partial Thickness] Exudate Amount: [1:Large] [2:Large] [3:Small] Exudate Type: [1:Serous] [2:Serous] [3:Serous] Exudate Color: [1:amber] [2:amber] [3:amber] Wound Margin: [1:Flat and Intact] [2:Flat and Intact] [3:Flat and  Intact] Granulation Amount: [1:Medium (34-66%)] [2:None Present (0%)] [3:Medium (34-66%)] Granulation Quality: [1:Red] [2:N/A] [3:Red] Necrotic Amount: [1:Medium (34-66%)] [2:Large (67-100%)] [3:Medium (34-66%)] Exposed Structures: [1:Fascia: No Fat: No Tendon: No Muscle: No Joint: No] [2:Fascia: No Fat: No Tendon: No Muscle: No Joint: No] [3:Fascia: No Fat: No Tendon: No Muscle: No Joint: No] Bone: No Bone: No Bone: No Limited to Skin Limited to Skin Limited to Skin Breakdown Breakdown Breakdown Epithelialization: Small (1-33%) Small (1-33%) None Periwound Skin Texture: Edema: No Edema: No Edema: No Excoriation: No Excoriation: No Excoriation: No Induration: No Induration: No Induration: No Callus: No Callus: No Callus: No Crepitus: No Crepitus: No Crepitus: No Fluctuance: No Fluctuance: No Fluctuance: No Friable: No Friable: No Friable: No Rash: No Rash: No Rash: No Scarring: No Scarring: No Scarring: No Periwound Skin Maceration: Yes Moist: Yes Moist: Yes Moisture: Moist: Yes Maceration: No Maceration: No Dry/Scaly: No Dry/Scaly: No Dry/Scaly: No Periwound Skin Color: Erythema: Yes Erythema: Yes Erythema: Yes Atrophie Blanche: No Atrophie Blanche: No Atrophie Blanche: No Cyanosis: No Cyanosis: No Cyanosis: No Ecchymosis: No Ecchymosis: No Ecchymosis: No Hemosiderin Staining: No Hemosiderin Staining: No Hemosiderin Staining: No Mottled: No Mottled: No Mottled: No Pallor: No Pallor: No Pallor: No Rubor: No Rubor: No Rubor: No Erythema Location: Circumferential Circumferential Circumferential Erythema Change: N/A Decreased N/A Temperature: No Abnormality No Abnormality No Abnormality Tenderness on Yes Yes Yes Palpation: Wound Preparation: Ulcer Cleansing: Ulcer Cleansing: Ulcer Cleansing: Rinsed/Irrigated with Rinsed/Irrigated with Rinsed/Irrigated with Saline Saline Saline Topical Anesthetic Topical Anesthetic Topical  Anesthetic Applied: Other: lidocaine Applied: Other: lidocaine Applied: Other: lidocaine 4% 4% 4% Treatment Notes Wound #1 (Right, Circumferential Lower Leg) 1. Cleansed with: Cleanse wound with antibacterial soap and water May Shower, gently pat wound dry prior to applying new dressing. 4. Dressing Applied: Aquacel Ag 5. Secondary Dressing Applied ABD Pad 7. Secured with Other (specify in notes) Notes Hunter Hardin, Hunter Hardin ([id number]) kerlix and coban Wound #2 (Left, Medial Malleolus) 1. Cleansed with: Cleanse wound with antibacterial soap and water May Shower, gently pat wound dry prior to applying new dressing. 4. Dressing Applied: Aquacel Ag 5. Secondary Dressing Applied ABD Pad 7. Secured with Other (specify in notes) Notes kerlix and coban Wound #3 (Left, Lateral Malleolus) 1. Cleansed with: Cleanse wound with antibacterial soap and water May Shower, gently pat wound dry prior to applying new dressing. 4. Dressing Applied: Aquacel Ag 5. Secondary Dressing Applied ABD Pad 7. Secured with Other (specify in notes) Notes kerlix and Event organiser) Signed: 12/17/2016 10:09:32 AM By: Christin Fudge MD, FACS Entered By: Christin Fudge on 12/17/2016 10:09:32 Hunter Hardin ([id number]) --------------------------------------------------------------------------------  Multi-Disciplinary Care Plan Details Patient Name: Hunter Hardin, Hunter Hardin. Date of Service: 12/17/2016 9:45 AM Medical Record Number: [id number] Patient Account Number: [id number] Date of Birth/Sex: [birth year] (81 y.o. Male) Treating RN: Hunter Hardin Gouty, RN, BSN, Velva Harman Primary Care Physician: Elsie Stain Other Clinician: Referring Physician: Elsie Stain Treating Physician/Extender: Frann Rider in Treatment: 3 Active Inactive Abuse / Safety / Falls / Self Care Management Nursing Diagnoses: Impaired physical mobility Potential for falls Goals: Patient will remain injury free Date  Initiated: 11/26/2016 Goal Status: Active Interventions: Assess fall risk on admission and as needed Notes: Nutrition Nursing Diagnoses: Potential for alteratiion in Nutrition/Potential for imbalanced nutrition Goals: Patient/caregiver agrees to and verbalizes understanding of need to use nutritional supplements and/or vitamins as prescribed Date Initiated: 11/26/2016 Goal Status: Active Interventions: Assess patient nutrition upon admission and as needed per policy Notes: Orientation to the Wound Care Program Nursing Diagnoses: Knowledge deficit related to the wound healing center program Goals: Hunter Hardin, Hunter Hardin ([id number]) Patient/caregiver will verbalize understanding of the [facility] Date Initiated: 11/26/2016 Goal Status: Active Interventions: Provide education on orientation to the wound center Notes: Venous Leg Ulcer Nursing Diagnoses: Potential for venous Insuffiency (use before diagnosis confirmed) Goals: Non-invasive venous studies are completed as ordered Date Initiated: 11/26/2016 Goal Status: Active Patient will maintain optimal edema control Date Initiated: 11/26/2016 Goal Status: Active Interventions: Assess peripheral edema status every visit. Notes: Wound/Skin Impairment Nursing Diagnoses: Impaired tissue integrity Goals: Patient/caregiver will verbalize understanding of skin care regimen Date Initiated: 11/26/2016 Goal Status: Active Ulcer/skin breakdown will have a volume reduction of 30% by week 4 Date Initiated: 11/26/2016 Goal Status: Active Ulcer/skin breakdown will have a volume reduction of 50% by week 8 Date Initiated: 11/26/2016 Goal Status: Active Ulcer/skin breakdown will have a volume reduction of 80% by week 12 Date Initiated: 11/26/2016 Goal Status: Active Ulcer/skin breakdown will heal within 14 weeks Date Initiated: 11/26/2016 Hunter Hardin, Hunter Hardin ([id number]) Goal Status: Active Interventions: Assess  patient/caregiver ability to obtain necessary supplies Assess patient/caregiver ability to perform ulcer/skin care regimen upon admission and as needed Assess ulceration(s) every visit Notes: Electronic Signature(s) Signed: 12/17/2016 4:00:14 PM By: Regan Lemming BSN, RN Entered By: Regan Lemming on 12/17/2016 09:51:54 Hunter Hardin ([id number]) -------------------------------------------------------------------------------- Pain Assessment Details Patient Name: Hunter Hardin. Date of Service: 12/17/2016 9:45 AM Medical Record Number: [id number] Patient Account Number: [id number] Date of Birth/Sex: 1935/04/07 (81 y.o. Male) Treating RN: Hunter Hardin Gouty, RN, BSN, Velva Harman Primary Care Physician: Elsie Stain Other Clinician: Referring Physician: Elsie Stain Treating Physician/Extender: Frann Rider in Treatment: 3 Active Problems Location of Pain Severity and Description of Pain Patient Has Paino No Site Locations With Dressing Change: No Pain Management and Medication Current Pain Management: Electronic Signature(s) Signed: 12/17/2016 4:00:14 PM By: Regan Lemming BSN, RN Entered By: Regan Lemming on 12/17/2016 09:35:28 Hunter Hardin ([id number]) -------------------------------------------------------------------------------- Patient/Caregiver Education Details Patient Name: Hunter Hardin Date of Service: 12/17/2016 9:45 AM Medical Record Number: [id number] Patient Account Number: [id number] Date of Birth/Gender: 1935/05/30 (81 y.o. Male) Treating RN: Hunter Hardin Gouty, RN, BSN, Velva Harman Primary Care Physician: Elsie Stain Other Clinician: Referring Physician: Elsie Stain Treating Physician/Extender: Frann Rider in Treatment: 3 Education Assessment Education Provided To: Patient and Caregiver Education Topics Provided Welcome To [facility]: Methods: Explain/Verbal Responses: State content correctly Wound/Skin Impairment: Methods:  Explain/Verbal Responses: State content correctly Electronic Signature(s) Signed: 12/17/2016 4:00:14 PM By: Regan Lemming BSN, RN Entered By: Regan Lemming on 12/17/2016 10:06:27 Hunter Hardin ([id number]) -------------------------------------------------------------------------------- Wound Assessment Details Patient Name: Hunter Hardin,  Hunter C. Date of Service: 12/17/2016 9:45 AM Medical Record Number: [id number] Patient Account Number: [id number] Date of Birth/Sex: 06-27-1935 (81 y.o. Male) Treating RN: Hunter Hardin Gouty, RN, BSN, [place] Primary Care Physician: Elsie Stain Other Clinician: Referring Physician: Elsie Stain Treating Physician/Extender: Frann Rider in Treatment: 3 Wound Status Wound Number: 1 Primary Venous Leg Ulcer Etiology: Wound Location: Right Lower Leg - Circumfernential Wound Status: Open Wounding Event: Gradually Appeared Comorbid Osteoarthritis, Received History: Chemotherapy Date Acquired: 11/09/2016 Weeks Of Treatment: 3 Clustered Wound: Yes Photos Photo Uploaded By: Regan Lemming on 12/17/2016 15:57:42 Wound Measurements Length: (cm) 13 Width: (cm) 7.5 Depth: (cm) 0.1 Area: (cm) 76.576 Volume: (cm) 7.658 % Reduction in Area: 47.4% % Reduction in Volume: 47.4% Epithelialization: Small (1-33%) Tunneling: No Undermining: No Wound Description Classification: Partial Thickness Wound Margin: Flat and Intact Exudate Amount: Large Exudate Type: Serous Exudate Color: amber Foul Odor After Cleansing: No Wound Bed Granulation Amount: Medium (34-66%) Exposed Structure Granulation Quality: Red Fascia Exposed: No Necrotic Amount: Medium (34-66%) Fat Layer Exposed: No GREGERY, WALBERG ([id number]) Necrotic Quality: Adherent Slough Tendon Exposed: No Muscle Exposed: No Joint Exposed: No Bone Exposed: No Limited to Skin Breakdown Periwound Skin Texture Texture Color No Abnormalities Noted: No No Abnormalities Noted: No Callus: No Atrophie  Blanche: No Crepitus: No Cyanosis: No Excoriation: No Ecchymosis: No Fluctuance: No Erythema: Yes Friable: No Erythema Location: Circumferential Induration: No Hemosiderin Staining: No Localized Edema: No Mottled: No Rash: No Pallor: No Scarring: No Rubor: No Moisture Temperature / Pain No Abnormalities Noted: No Temperature: No Abnormality Dry / Scaly: No Tenderness on Palpation: Yes Maceration: Yes Moist: Yes Wound Preparation Ulcer Cleansing: Rinsed/Irrigated with Saline Topical Anesthetic Applied: Other: lidocaine 4%, Treatment Notes Wound #1 (Right, Circumferential Lower Leg) 1. Cleansed with: Cleanse wound with antibacterial soap and water May Shower, gently pat wound dry prior to applying new dressing. 4. Dressing Applied: Aquacel Ag 5. Secondary Dressing Applied ABD Pad 7. Secured with Other (specify in notes) Notes kerlix and Event organiser) Signed: 12/17/2016 4:00:14 PM By: Regan Lemming BSN, RN Entered By: Regan Lemming on 12/17/2016 09:47:57 Hunter Hardin, Hunter Hardin ([id number]) Hunter Hardin, Hunter Hardin ([id number]) -------------------------------------------------------------------------------- Wound Assessment Details Patient Name: Hunter Hardin, Hunter Hardin. Date of Service: 12/17/2016 9:45 AM Medical Record Number: [id number] Patient Account Number: [id number] Date of Birth/Sex: 03-24-1935 (81 y.o. Male) Treating RN: Hunter Hardin Gouty, RN, BSN, [place] Primary Care Physician: Elsie Stain Other Clinician: Referring Physician: Elsie Stain Treating Physician/Extender: Frann Rider in Treatment: 3 Wound Status Wound Number: 2 Primary Venous Leg Ulcer Etiology: Wound Location: Left Malleolus - Medial Wound Status: Open Wounding Event: Gradually Appeared Comorbid Osteoarthritis, Received Date Acquired: 11/09/2016 History: Chemotherapy Weeks Of Treatment: 3 Clustered Wound: Yes Photos Photo Uploaded By: Regan Lemming on 12/17/2016 15:57:43 Wound  Measurements Length: (cm) 0.2 Width: (cm) 0.2 Depth: (cm) 0.1 Area: (cm) 0.031 Volume: (cm) 0.003 % Reduction in Area: 99.3% % Reduction in Volume: 99.3% Epithelialization: Small (1-33%) Tunneling: No Undermining: No Wound Description Classification: Partial Thickness Wound Margin: Flat and Intact Exudate Amount: Large Exudate Type: Serous Exudate Color: amber Foul Odor After Cleansing: No Wound Bed Granulation Amount: None Present (0%) Exposed Structure Necrotic Amount: Large (67-100%) Fascia Exposed: No Necrotic Quality: Adherent Slough Fat Layer Exposed: No Tendon Exposed: No RAMIL, EDGINGTON C. ([id number]) Muscle Exposed: No Joint Exposed: No Bone Exposed: No Limited to Skin Breakdown Periwound Skin Texture Texture Color No Abnormalities Noted: No No Abnormalities Noted: No Callus: No Atrophie Blanche: No Crepitus: No Cyanosis: No Excoriation: No Ecchymosis: No Fluctuance: No  Erythema: Yes Friable: No Erythema Location: Circumferential Induration: No Erythema Change: Decreased Localized Edema: No Hemosiderin Staining: No Rash: No Mottled: No Scarring: No Pallor: No Rubor: No Moisture No Abnormalities Noted: No Temperature / Pain Dry / Scaly: No Temperature: No Abnormality Maceration: No Tenderness on Palpation: Yes Moist: Yes Wound Preparation Ulcer Cleansing: Rinsed/Irrigated with Saline Topical Anesthetic Applied: Other: lidocaine 4%, Treatment Notes Wound #2 (Left, Medial Malleolus) 1. Cleansed with: Cleanse wound with antibacterial soap and water May Shower, gently pat wound dry prior to applying new dressing. 4. Dressing Applied: Aquacel Ag 5. Secondary Dressing Applied ABD Pad 7. Secured with Other (specify in notes) Notes kerlix and Event organiser) Signed: 12/17/2016 4:00:14 PM By: Regan Lemming BSN, RN Entered By: Regan Lemming on 12/17/2016 09:48:56 Hunter Hardin  ([id number]) -------------------------------------------------------------------------------- Wound Assessment Details Patient Name: Hunter Hardin Date of Service: 12/17/2016 9:45 AM Medical Record Number: [id number] Patient Account Number: [id number] Date of Birth/Sex: [birth year] (81 y.o. Male) Treating RN: Hunter Hardin Gouty, RN, BSN, [place] Primary Care Physician: Elsie Stain Other Clinician: Referring Physician: Elsie Stain Treating Physician/Extender: Frann Rider in Treatment: 3 Wound Status Wound Number: 3 Primary Venous Leg Ulcer Etiology: Wound Location: Left Malleolus - Lateral Wound Status: Open Wounding Event: Gradually Appeared Comorbid Osteoarthritis, Received Date Acquired: 11/09/2016 History: Chemotherapy Weeks Of Treatment: 3 Clustered Wound: No Photos Photo Uploaded By: Regan Lemming on 12/17/2016 15:58:14 Wound Measurements Length: (cm) 1 % Reduction in Width: (cm) 1.4 % Reduction in Depth: (cm) 0.1 Epithelializat Area: (cm) 1.1 Tunneling: Volume: (cm) 0.11 Undermining: Area: 58.3% Volume: 58.3% ion: None No No Wound Description Classification: Partial Thickness Wound Margin: Flat and Intact Exudate Amount: Small Exudate Type: Serous Exudate Color: amber RAWSON, MINIX ([id number]) Foul Odor After Cleansing: No Wound Bed Granulation Amount: Medium (34-66%) Exposed Structure Granulation Quality: Red Fascia Exposed: No Necrotic Amount: Medium (34-66%) Fat Layer Exposed: No Necrotic Quality: Adherent Slough Tendon Exposed: No Muscle Exposed: No Joint Exposed: No Bone Exposed: No Limited to Skin Breakdown Periwound Skin Texture Texture Color No Abnormalities Noted: No No Abnormalities Noted: No Callus: No Atrophie Blanche: No Crepitus: No Cyanosis: No Excoriation: No Ecchymosis: No Fluctuance: No Erythema: Yes Friable: No Erythema Location: Circumferential Induration: No Hemosiderin Staining: No Localized Edema:  No Mottled: No Rash: No Pallor: No Scarring: No Rubor: No Moisture Temperature / Pain No Abnormalities Noted: No Temperature: No Abnormality Dry / Scaly: No Tenderness on Palpation: Yes Maceration: No Moist: Yes Wound Preparation Ulcer Cleansing: Rinsed/Irrigated with Saline Topical Anesthetic Applied: Other: lidocaine 4%, Treatment Notes Wound #3 (Left, Lateral Malleolus) 1. Cleansed with: Cleanse wound with antibacterial soap and water May Shower, gently pat wound dry prior to applying new dressing. 4. Dressing Applied: Aquacel Ag 5. Secondary Dressing Applied ABD Pad 7. Secured with Other (specify in notes) Notes kerlix and EMMERT, ROETHLER ([id number]) Electronic Signature(s) Signed: 12/17/2016 4:00:14 PM By: Regan Lemming BSN, RN Entered By: Regan Lemming on 12/17/2016 [place], Perlie C. ([id number]) -------------------------------------------------------------------------------- Vitals Details Patient Name: Hunter Hardin Date of Service: 12/17/2016 9:45 AM Medical Record Number: [id number] Patient Account Number: [id number] Date of Birth/Sex: 1935/06/04 (81 y.o. Male) Treating RN: Hunter Hardin Gouty, RN, BSN, [place] Primary Care Physician: Elsie Stain Other Clinician: Referring Physician: Elsie Stain Treating Physician/Extender: Frann Rider in Treatment: 3 Vital Signs Time Taken: 09:37 Pulse (bpm): 67 Height (in): 67 Respiratory Rate (breaths/min): 17 Weight (lbs): 138 Blood Pressure (mmHg): 113/63 Body Mass Index (BMI): 21.6 Reference Range: 80 - 120 mg / dl Notes unable  to get temp due to cold mouth from drinking cold water Electronic Signature(s) Signed: 12/17/2016 4:00:14 PM By: Regan Lemming BSN, RN Entered By: Regan Lemming on 12/17/2016 09:38:05

## 2016-12-18 NOTE — Progress Notes (Signed)
GUSSIE, TOWSON ([id number]) Visit Report for 12/17/2016 Chief Complaint Document Details Patient Name: Hunter Hardin, Hunter Hardin. Date of Service: 12/17/2016 9:45 AM Medical Record Number: [id number] Patient Account Number: [id number] Date of Birth/Sex: November 17, 1935 (81 y.o. Male) Treating RN: Baruch Gouty, RN, BSN, Velva Harman Primary Care Physician: Elsie Stain Other Clinician: Referring Physician: Elsie Stain Treating Physician/Extender: Frann Rider in Treatment: 3 Information Obtained from: Patient Chief Complaint Patient presents to the wound care center for a evaluation of non healing wound to both lower extremities Electronic Signature(s) Signed: 12/17/2016 10:09:39 AM By: Christin Fudge MD, FACS Entered By: Christin Fudge on 12/17/2016 10:09:39 Hunter Hardin ([id number]) -------------------------------------------------------------------------------- HPI Details Patient Name: Hunter Hardin Date of Service: 12/17/2016 9:45 AM Medical Record Number: [id number] Patient Account Number: [id number] Date of Birth/Sex: [birth year] (81 y.o. Male) Treating RN: Baruch Gouty, RN, BSN, Velva Harman Primary Care Physician: Elsie Stain Other Clinician: Referring Physician: Elsie Stain Treating Physician/Extender: Frann Rider in Treatment: 3 History of Present Illness Location: both lower extremities right worse than left Quality: Patient reports experiencing a dull pain to affected area(s). Severity: Patient states wound are getting worse. Duration: Patient has had the wound for < 3 weeks prior to presenting for treatment Timing: Pain in wound is Intermittent (comes and goes Context: The wound would happen gradually Modifying Factors: Other treatment(s) tried include:local ointments and local care Associated Signs and Symptoms: Patient reports having increase swelling. 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 Past medical history significant for essential hypertension, pleural effusion, colon cancer, hypothyroidism, renal cell cancer, hyperglycemia, thrombocytopenia, protein calorie malnutrition. the patient lives alone and has very little support and has a family friend who is helping him with his appointments. He has not seen a dermatologist for this condition recently. 12/03/2016 -- he continues to have a lot of pain and has not yet seen a dermatologist. 12-10-16 Mr Goodall returns today for evaluation of his bilateral lower strumming ulcerations. He has not made an appointment with dermatology as yet. He is accompanied by his cousin, she has accompanied him to all of his appointments and is inquiring about the need for dermatology in light of his overall state of health. He recently had a thoracentesis (12/02/2016) with removal of "2 canisters". Mr. Mincey primary complaint is of pain to these ulcerations, he has been prescribed pain medication and takes that nightly. He is voicing no complaints regarding dressing changes or home health. 12/17/2016 -- right spoken to his PCP, Dr. Damita Dunnings regarding his pain management and sleep deprivation and Dr. Damita Dunnings was kind enough to take care of this. The patient has not yet seen dermatology. Electronic Signature(s) Signed: 12/17/2016 10:10:18 AM By: Christin Fudge MD, FACS Entered By: Christin Fudge on 12/17/2016 10:10:17 Hunter Hardin ([id number]) -------------------------------------------------------------------------------- Physical Exam Details Patient Name: Hunter Hardin Date of Service: 12/17/2016 9:45 AM Medical Record Number: [id number] Patient Account Number: [id number] Date of Birth/Sex: [birth year] (81 y.o. Male) Treating RN: Baruch Gouty, RN, BSN, Velva Harman Primary Care  Physician: Elsie Stain Other Clinician: Referring Physician: Elsie Stain Treating Physician/Extender: Frann Rider in Treatment: 3 Constitutional . Pulse regular. Respirations normal and unlabored. Afebrile. . Eyes Nonicteric. Reactive to light. Ears, Nose, Mouth, and Throat Lips, teeth, and gums WNL.[name] Moist mucosa without lesions. Neck supple and nontender. No palpable supraclavicular or  cervical adenopathy. Normal sized without goiter. Respiratory WNL. No retractions.. Breath sounds WNL, No rubs, rales, rhonchi, or wheeze.. Cardiovascular Heart rhythm and rate regular, no murmur or gallop.. Pedal Pulses WNL. No clubbing, cyanosis or edema. Chest Breasts symmetical and no nipple discharge.. Breast tissue WNL, no masses, lumps, or tenderness.. Lymphatic No adneopathy. No adenopathy. No adenopathy. Musculoskeletal Adexa without tenderness or enlargement.. Digits and nails w/o clubbing, cyanosis, infection, petechiae, ischemia, or inflammatory conditions.. Integumentary (Hair, Skin) No suspicious lesions. No crepitus or fluctuance. No peri-wound warmth or erythema. No masses.[name] Psychiatric Judgement and insight Intact.. No evidence of depression, anxiety, or agitation.. Notes the left lower extremity wound looks very much better and the right side, he has several ulcerations on the lower half of his right leg. These are superficial but very painful. No sharp debridement was required today. Electronic Signature(s) Signed: 12/17/2016 10:11:17 AM By: Christin Fudge MD, FACS Entered By: Christin Fudge on 12/17/2016 10:11:17 Hunter Hardin ([id number]) -------------------------------------------------------------------------------- Physician Orders Details Patient Name: Hunter Hardin, Hunter Hardin. Date of Service: 12/17/2016 9:45 AM Medical Record Number: [id number] Patient Account Number: [id number] Date of Birth/Sex: Feb 01, 1935 (81 y.o. Male) Treating RN: Baruch Gouty, RN, BSN,  Velva Harman Primary Care Physician: Elsie Stain Other Clinician: Referring Physician: Elsie Stain Treating Physician/Extender: Frann Rider in Treatment: 3 Verbal / Phone Orders: Yes Clinician: Afful, RN, BSN, Rita Read Back and Verified: Yes Diagnosis Coding Wound Cleansing Wound #1 Right,Circumferential Lower Leg o Clean wound with Normal Saline. o Cleanse wound with mild soap and water Wound #2 Left,Medial Malleolus o Clean wound with Normal Saline. o Cleanse wound with mild soap and water Wound #3 Left,Lateral Malleolus o Clean wound with Normal Saline. o Cleanse wound with mild soap and water Anesthetic Wound #1 Right,Circumferential Lower Leg o Topical Lidocaine 4% cream applied to wound bed prior to debridement - clinic use Wound #2 Left,Medial Malleolus o Topical Lidocaine 4% cream applied to wound bed prior to debridement - clinic use Wound #3 Left,Lateral Malleolus o Topical Lidocaine 4% cream applied to wound bed prior to debridement - clinic use Primary Wound Dressing Wound #1 Right,Circumferential Lower Leg o Aquacel Ag Wound #2 Left,Medial Malleolus o Aquacel Ag Wound #3 Left,Lateral Malleolus o Aquacel Ag Secondary Dressing Wound #1 Right,Circumferential Lower Leg o ABD pad - if needed for drainage Hunter Hardin, Hunter Hardin ([id number]) Wound #2 Left,Medial Malleolus o ABD pad - if needed for drainage Wound #3 Left,Lateral Malleolus o ABD pad - if needed for drainage Dressing Change Frequency Wound #1 Right,Circumferential Lower Leg o Dressing is to be changed Monday and Thursday. Wound #2 Left,Medial Malleolus o Dressing is to be changed Monday and Thursday. Wound #3 Left,Lateral Malleolus o Dressing is to be changed Monday and Thursday. Follow-up Appointments Wound #1 Right,Circumferential Lower Leg o Return Appointment in 1 week. Wound #2 Left,Medial Malleolus o Return Appointment in 1 week. Wound #3  Left,Lateral Malleolus o Return Appointment in 1 week. Edema Control Wound #1 Right,Circumferential Lower Leg o Kerlix and Coban - Bilateral - lightly Wound #2 Left,Medial Malleolus o Kerlix and Coban - Bilateral - lightly Wound #3 Left,Lateral Malleolus o Kerlix and Coban - Bilateral - lightly Additional Orders / Instructions Wound #1 Right,Circumferential Lower Leg o Increase protein intake. Wound #2 Left,Medial Malleolus o Increase protein intake. Wound #3 Left,Lateral Malleolus o Increase protein intake. Hunter Hardin, Hunter Hardin ([id number]) Home Health Wound #1 Right,Circumferential Lower Leg o [place] Visits - Evaluate for any therapy needs patient has and get that order from patient's PCP Dr  [place] Nurse may visit PRN to address patientos wound care needs. o FACE TO FACE ENCOUNTER: MEDICARE and MEDICAID PATIENTS: I certify that this patient is under my care and that I had a face-to-face encounter that meets the physician face-to-face encounter requirements with this patient on this date. The encounter with the patient was in whole or in part for the following MEDICAL CONDITION: (primary reason for [place]) MEDICAL NECESSITY: I certify, that based on my findings, NURSING services are a medically necessary home health service. HOME BOUND STATUS: I certify that my clinical findings support that this patient is homebound (i.e., Due to illness or injury, pt requires aid of supportive devices such as crutches, cane, wheelchairs, walkers, the use of special transportation or the assistance of another person to leave their place of residence. There is a normal inability to leave the home and doing so requires considerable and taxing effort. Other absences are for medical reasons / religious services and are infrequent or of short duration when for other reasons). o If current dressing causes regression in wound condition, may  D/C ordered dressing product/s and apply Normal Saline Moist Dressing daily until next [place] / Other MD appointment. [place] of regression in wound condition at [phone]. o Please direct any NON-WOUND related issues/requests for orders to patient's Primary Care Physician Wound #2 [place] Visits - Evaluate for any therapy needs patient has and get that order from patient's PCP Dr Elsie Stain o Home Health Nurse may visit PRN to address patientos wound care needs. o FACE TO FACE ENCOUNTER: MEDICARE and MEDICAID PATIENTS: I certify that this patient is under my care and that I had a face-to-face encounter that meets the physician face-to-face encounter requirements with this patient on this date. The encounter with the patient was in whole or in part for the following MEDICAL CONDITION: (primary reason for [place]) MEDICAL NECESSITY: I certify, that based on my findings, NURSING services are a medically necessary home health service. HOME BOUND STATUS: I certify that my clinical findings support that this patient is homebound (i.e., Due to illness or injury, pt requires aid of supportive devices such as crutches, cane, wheelchairs, walkers, the use of special transportation or the assistance of another person to leave their place of residence. There is a normal inability to leave the home and doing so requires considerable and taxing effort. Other absences are for medical reasons / religious services and are infrequent or of short duration when for other reasons). o If current dressing causes regression in wound condition, may D/C ordered dressing product/s and apply Normal Saline Moist Dressing daily until next [place] / Other MD appointment. [place] of regression in wound condition at [phone]. o Please direct any NON-WOUND related issues/requests for orders to  patient's Primary Care Physician Wound #3 [place] Visits - Evaluate for any therapy needs patient has and get that order from patient's PCP Dr Elsie Stain o Home Health Nurse may visit PRN to address patientos wound care needs. MONNIE, ANSPACH ([id number]) o FACE TO FACE ENCOUNTER: MEDICARE and MEDICAID PATIENTS: I certify that this patient is under my care and that I had a face-to-face encounter that meets the physician face-to-face encounter requirements with this patient on this date. The encounter with the patient was in whole or in part for the following MEDICAL CONDITION: (primary reason for [place]) MEDICAL NECESSITY:  I certify, that based on my findings, NURSING services are a medically necessary home health service. HOME BOUND STATUS: I certify that my clinical findings support that this patient is homebound (i.e., Due to illness or injury, pt requires aid of supportive devices such as crutches, cane, wheelchairs, walkers, the use of special transportation or the assistance of another person to leave their place of residence. There is a normal inability to leave the home and doing so requires considerable and taxing effort. Other absences are for medical reasons / religious services and are infrequent or of short duration when for other reasons). o If current dressing causes regression in wound condition, may D/C ordered dressing product/s and apply Normal Saline Moist Dressing daily until next [place] / Other MD appointment. [place] of regression in wound condition at [phone]. o Please direct any NON-WOUND related issues/requests for orders to patient's Primary Care Physician Electronic Signature(s) Signed: 12/17/2016 4:00:14 PM By: Regan Lemming BSN, RN Signed: 12/17/2016 4:37:15 PM By: Christin Fudge MD, FACS Entered By: Regan Lemming on 12/17/2016 09:54:13 Hunter Hardin  ([id number]) -------------------------------------------------------------------------------- Problem List Details Patient Name: BRALIN, GARRY. Date of Service: 12/17/2016 9:45 AM Medical Record Number: [id number] Patient Account Number: [id number] Date of Birth/Sex: 08-29-1935 (81 y.o. 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 to 11/26/2016 Yes breakdown of skin I89.0 Lymphedema, not elsewhere classified 11/26/2016 Yes E44.0 Moderate protein-calorie malnutrition 11/26/2016 Yes L13.9 Bullous disorder, unspecified 11/26/2016 Yes Z85.528 Personal history of other malignant neoplasm of kidney 11/26/2016 Yes Inactive Problems Resolved Problems Electronic Signature(s) Signed: 12/17/2016 10:09:10 AM By: Christin Fudge MD, FACS Entered By: Christin Fudge on 12/17/2016 10:09:10 Hunter Hardin ([id number]) -------------------------------------------------------------------------------- Progress Note Details Patient Name: Hunter Hardin Date of Service: 12/17/2016 9:45 AM Medical Record Number: [id number] Patient Account Number: [id number] Date of Birth/Sex: 09/08/1935 (81 y.o. Male) Treating RN: Baruch Gouty, RN, BSN, Velva Harman Primary Care Physician: Elsie Stain Other Clinician: Referring Physician: Elsie Stain Treating Physician/Extender: Frann Rider in Treatment: 3 Subjective Chief Complaint Information obtained from Patient Patient presents to the wound care center for a evaluation of non healing wound to both lower extremities History of Present Illness (HPI) The following HPI elements were documented  for the patient's wound: Location: both lower extremities right worse than left Quality: Patient reports experiencing a dull pain to affected area(s). Severity: Patient states wound are getting worse. Duration: Patient has had the wound for < 3 weeks prior to presenting for treatment Timing: Pain in wound is Intermittent (comes and goes Context: The wound would happen gradually Modifying Factors: Other treatment(s) tried include:local ointments and local care Associated Signs and Symptoms: Patient reports having increase swelling. 81 year old patient with a history of colon cancer status post right hemicolectomy and stage IV renal cell carcinoma with metastasis to the lung and bone also has hypertension, chronic bilateral pleural effusion and chronic swelling in his legs which are now getting worse. Past medical history significant for essential hypertension, pleural effusion, colon cancer, hypothyroidism, renal cell cancer, hyperglycemia, thrombocytopenia, protein calorie malnutrition. the patient lives alone and has very little support and has a family friend who is helping him with his appointments. He has not seen a dermatologist for this  condition recently. 12/03/2016 -- he continues to have a lot of pain and has not yet seen a dermatologist. 12-10-16 Mr Gaglio returns today for evaluation of his bilateral lower strumming ulcerations. He has not made an appointment with dermatology as yet. He is accompanied by his cousin, she has accompanied him to all of his appointments and is inquiring about the need for dermatology in light of his overall state of health. He recently had a thoracentesis (12/02/2016) with removal of "2 canisters". Mr. Lloyd primary complaint is of pain to these ulcerations, he has been prescribed pain medication and takes that nightly. He is voicing no complaints regarding dressing changes or home health. 12/17/2016 -- right spoken to his PCP, Dr. Damita Dunnings  regarding his pain management and sleep deprivation and Dr. Damita Dunnings was kind enough to take care of this. The patient has not yet seen dermatology. Hunter Hardin, Hunter Hardin ([id number]) Objective Constitutional Pulse regular. Respirations normal and unlabored. Afebrile. Vitals Time Taken: 9:37 AM, Height: 67 in, Weight: 138 lbs, BMI: 21.6, Pulse: 67 bpm, Respiratory Rate: 17 breaths/min, Blood Pressure: 113/63 mmHg. General Notes: unable to get temp due to cold mouth from drinking cold water Eyes Nonicteric. Reactive to light. Ears, Nose, Mouth, and Throat Lips, teeth, and gums WNL.[name] Moist mucosa without lesions. Neck supple and nontender. No palpable supraclavicular or cervical adenopathy. Normal sized without goiter. Respiratory WNL. No retractions.. Breath sounds WNL, No rubs, rales, rhonchi, or wheeze.. Cardiovascular Heart rhythm and rate regular, no murmur or gallop.. Pedal Pulses WNL. No clubbing, cyanosis or edema. Chest Breasts symmetical and no nipple discharge.. Breast tissue WNL, no masses, lumps, or tenderness.. Lymphatic No adneopathy. No adenopathy. No adenopathy. Musculoskeletal Adexa without tenderness or enlargement.. Digits and nails w/o clubbing, cyanosis, infection, petechiae, ischemia, or inflammatory conditions.[name] Psychiatric Judgement and insight Intact.. No evidence of depression, anxiety, or agitation.. General Notes: the left lower extremity wound looks very much better and the right side, he has several ulcerations on the lower half of his right leg. These are superficial but very painful. No sharp debridement was required today. Integumentary (Hair, Skin) No suspicious lesions. No crepitus or fluctuance. No peri-wound warmth or erythema. No masses.. Wound #1 status is Open. Original cause of wound was Gradually Appeared. The wound is located on the Right,Circumferential Lower Leg. The wound measures 13cm length x 7.5cm width x 0.1cm depth; 76.576cm^2 area and  7.658cm^3 volume. The wound is limited to skin breakdown. There is no tunneling or Hunter Hardin, NAUTA. ([id number]) undermining noted. There is a large amount of serous drainage noted. The wound margin is flat and intact. There is medium (34-66%) red granulation within the wound bed. There is a medium (34-66%) amount of necrotic tissue within the wound bed including Adherent Slough. The periwound skin appearance exhibited: Maceration, Moist, Erythema. The periwound skin appearance did not exhibit: Callus, Crepitus, Excoriation, Fluctuance, Friable, Induration, Localized Edema, Rash, Scarring, Dry/Scaly, Atrophie Blanche, Cyanosis, Ecchymosis, Hemosiderin Staining, Mottled, Pallor, Rubor. The surrounding wound skin color is noted with erythema which is circumferential. Periwound temperature was noted as No Abnormality. The periwound has tenderness on palpation. Wound #2 status is Open. Original cause of wound was Gradually Appeared. The wound is located on the Left,Medial Malleolus. The wound measures 0.2cm length x 0.2cm width x 0.1cm depth; 0.031cm^2 area and 0.003cm^3 volume. The wound is limited to skin breakdown. There is no tunneling or undermining noted. There is a large amount of serous drainage noted. The wound margin is flat and intact. There is no  granulation within the wound bed. There is a large (67-100%) amount of necrotic tissue within the wound bed including Adherent Slough. The periwound skin appearance exhibited: Moist, Erythema. The periwound skin appearance did not exhibit: Callus, Crepitus, Excoriation, Fluctuance, Friable, Induration, Localized Edema, Rash, Scarring, Dry/Scaly, Maceration, Atrophie Blanche, Cyanosis, Ecchymosis, Hemosiderin Staining, Mottled, Pallor, Rubor. The surrounding wound skin color is noted with erythema which is circumferential. Periwound temperature was noted as No Abnormality. The periwound has tenderness on palpation. Wound #3 status is Open.  Original cause of wound was Gradually Appeared. The wound is located on the Left,Lateral Malleolus. The wound measures 1cm length x 1.4cm width x 0.1cm depth; 1.1cm^2 area and 0.11cm^3 volume. The wound is limited to skin breakdown. There is no tunneling or undermining noted. There is a small amount of serous drainage noted. The wound margin is flat and intact. There is medium (34-66%) red granulation within the wound bed. There is a medium (34-66%) amount of necrotic tissue within the wound bed including Adherent Slough. The periwound skin appearance exhibited: Moist, Erythema. The periwound skin appearance did not exhibit: Callus, Crepitus, Excoriation, Fluctuance, Friable, Induration, Localized Edema, Rash, Scarring, Dry/Scaly, Maceration, Atrophie Blanche, Cyanosis, Ecchymosis, Hemosiderin Staining, Mottled, Pallor, Rubor. The surrounding wound skin color is noted with erythema which is circumferential. Periwound temperature was noted as No Abnormality. The periwound has tenderness on palpation. 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 Cleanse wound with mild soap and water Wound #2 Left,Medial Malleolus: Clean wound with Normal Saline. Cleanse wound with mild soap and water Wound #3 Left,Lateral Malleolus: Clean wound with Normal Saline. Cleanse wound with mild soap and water Anesthetic: Wound #1 Right,Circumferential Lower Leg: Topical Lidocaine 4% cream applied to wound bed prior to debridement - clinic  use Wound #2 Left,Medial Malleolus: Topical Lidocaine 4% cream applied to wound bed prior to debridement - clinic use Wound #3 Left,Lateral Malleolus: Topical Lidocaine 4% cream applied to wound bed prior to debridement - clinic use Primary Wound Dressing: Wound #1 Right,Circumferential Lower Leg: Aquacel Ag Wound #2 Left,Medial Malleolus: Aquacel Ag Wound #3 Left,Lateral Malleolus: Aquacel Ag Secondary Dressing: Wound #1 Right,Circumferential Lower Leg: ABD pad - if needed for drainage Wound #2 Left,Medial Malleolus: ABD pad - if needed for drainage Wound #3 Left,Lateral Malleolus: ABD pad - if needed for drainage Dressing Change Frequency: Wound #1 Right,Circumferential Lower Leg: Dressing is to be changed Monday and Thursday. Wound #2 Left,Medial Malleolus: Dressing is to be changed Monday and Thursday. Wound #3 Left,Lateral Malleolus: Dressing is to be changed Monday and Thursday. Follow-up Appointments: Wound #1 Right,Circumferential Lower Leg: Return Appointment in 1 week. Wound #2 Left,Medial Malleolus: Return Appointment in 1 week. Wound #3 Left,Lateral Malleolus: Return Appointment in 1 week. Hunter Hardin, Hunter Hardin ([id number]) Edema Control: Wound #1 Right,Circumferential Lower Leg: Kerlix and Coban - Bilateral - lightly Wound #2 Left,Medial Malleolus: Kerlix and Coban - Bilateral - lightly Wound #3 Left,Lateral Malleolus: Kerlix and Coban - Bilateral - lightly Additional Orders / Instructions: Wound #1 Right,Circumferential Lower Leg: Increase protein intake. Wound #2 Left,Medial Malleolus: Increase protein intake. Wound #3 Left,Lateral Malleolus: Increase protein intake. Home Health: Wound #  1 Right,Circumferential Lower Leg: Continue Home Health Visits - Evaluate for any therapy needs patient has and get that order from patient's PCP Dr Renard Matter Nurse may visit PRN to address patient s wound care needs. FACE TO FACE ENCOUNTER: MEDICARE and  MEDICAID PATIENTS: I certify that this patient is under my care and that I had a face-to-face encounter that meets the physician face-to-face encounter requirements with this patient on this date. The encounter with the patient was in whole or in part for the following MEDICAL CONDITION: (primary reason for [place]) MEDICAL NECESSITY: I certify, that based on my findings, NURSING services are a medically necessary home health service. HOME BOUND STATUS: I certify that my clinical findings support that this patient is homebound (i.e., Due to illness or injury, pt requires aid of supportive devices such as crutches, cane, wheelchairs, walkers, the use of special transportation or the assistance of another person to leave their place of residence. There is a normal inability to leave the home and doing so requires considerable and taxing effort. Other absences are for medical reasons / religious services and are infrequent or of short duration when for other reasons). If current dressing causes regression in wound condition, may D/C ordered dressing product/s and apply Normal Saline Moist Dressing daily until next [place] / Other MD appointment. [place] of regression in wound condition at [phone]. Please direct any NON-WOUND related issues/requests for orders to patient's Primary Care Physician Wound #2 Left,Medial Malleolus: [place] Visits - Evaluate for any therapy needs patient has and get that order from patient's PCP Dr Phillip Heal [facility] Nurse may visit PRN to address patient s wound care needs. FACE TO FACE ENCOUNTER: MEDICARE and MEDICAID PATIENTS: I certify that this patient is under my care and that I had a face-to-face encounter that meets the physician face-to-face encounter requirements with this patient on this date. The encounter with the patient was in whole or in part for the following MEDICAL CONDITION: (primary  reason for [place]) MEDICAL NECESSITY: I certify, that based on my findings, NURSING services are a medically necessary home health service. HOME BOUND STATUS: I certify that my clinical findings support that this patient is homebound (i.e., Due to illness or injury, pt requires aid of supportive devices such as crutches, cane, wheelchairs, walkers, the use of special transportation or the assistance of another person to leave their place of residence. There is a normal inability to leave the home and doing so requires considerable and taxing effort. Other absences are for medical reasons / religious services and are infrequent or of short duration when for other reasons). If current dressing causes regression in wound condition, may D/C ordered dressing product/s and apply Normal Saline Moist Dressing daily until next [place] / Other MD appointment. [place] of regression in wound condition at [phone]. Please direct any NON-WOUND related issues/requests for orders to patient's Primary Care Physician Hunter Hardin, Hunter Hardin ([id number]) Wound #3 Left,Lateral Malleolus: [place] Visits - Evaluate for any therapy needs patient has and get that order from patient's PCP Dr Phillip Heal [facility] Nurse may visit PRN to address patient s wound care needs. FACE TO FACE ENCOUNTER: MEDICARE and MEDICAID PATIENTS: I certify that this patient is under my care and that I had a face-to-face encounter that meets the physician face-to-face encounter requirements with this patient on this date. The encounter with the patient was in whole  or in part for the following MEDICAL CONDITION: (primary reason for Home Healthcare) MEDICAL NECESSITY: I certify, that based on my findings, NURSING services are a medically necessary home health service. HOME BOUND STATUS: I certify that my clinical findings support that this patient is homebound (i.e., Due to illness or  injury, pt requires aid of supportive devices such as crutches, cane, wheelchairs, walkers, the use of special transportation or the assistance of another person to leave their place of residence. There is a normal inability to leave the home and doing so requires considerable and taxing effort. Other absences are for medical reasons / religious services and are infrequent or of short duration when for other reasons). If current dressing causes regression in wound condition, may D/C ordered dressing product/s and apply Normal Saline Moist Dressing daily until next [place] / Other MD appointment. [place] of regression in wound condition at [phone]. Please direct any NON-WOUND related issues/requests for orders to patient's Primary Care Physician At this stage his management is palliative and hence I have recommended: 1. Silver alginate, to be lightly wrapped with Kerlix and Coban and change 3 times a week if there is significant drainage. 2. talk to his PCP regarding sleep medication and pain medications as he has a lot of generalized symptoms -- he says been sorted out last week 3. Elevation and exercise have been discussed with him 4. dermatology opinion when available -- I understand this is in the first week of January 5. Increase his protein intake, vitamin A, vitamin C and zinc 6. Weekly visits to the wound center for evaluation He and his caregiver do understand that our treatment is going to be palliative to help him alleviate some of his symptoms Electronic Signature(s) Signed: 12/17/2016 10:13:07 AM By: Christin Fudge MD, FACS Entered By: Christin Fudge on 12/17/2016 10:13:07 Hunter Hardin ([id number]) -------------------------------------------------------------------------------- SuperBill Details Patient Name: Hunter Hardin. Date of Service: 12/17/2016 Medical Record Number: [id number] Patient Account Number: [id number] Date of  Birth/Sex: [birth year] (81 y.o. 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 Facility Procedures CPT4 Code: [id number] Description: 81829 - WOUND CARE VISIT-LEV 5 EST PT Modifier: Quantity: 1 Physician Procedures CPT4 Code Description: [id number] 99213 - WC PHYS LEVEL 3 - EST PT ICD-10 Description Diagnosis L97.211 Non-pressure chronic ulcer of right calf limited to L97.321 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

## 2016-12-20 DIAGNOSIS — R1310 Dysphagia, unspecified: Secondary | ICD-10-CM | POA: Diagnosis not present

## 2016-12-20 DIAGNOSIS — C7800 Secondary malignant neoplasm of unspecified lung: Secondary | ICD-10-CM | POA: Diagnosis not present

## 2016-12-20 DIAGNOSIS — N401 Enlarged prostate with lower urinary tract symptoms: Secondary | ICD-10-CM | POA: Diagnosis not present

## 2016-12-20 DIAGNOSIS — J910 Malignant pleural effusion: Secondary | ICD-10-CM | POA: Diagnosis not present

## 2016-12-20 DIAGNOSIS — C649 Malignant neoplasm of unspecified kidney, except renal pelvis: Secondary | ICD-10-CM | POA: Diagnosis not present

## 2016-12-20 DIAGNOSIS — R634 Abnormal weight loss: Secondary | ICD-10-CM | POA: Diagnosis not present

## 2016-12-21 DIAGNOSIS — K219 Gastro-esophageal reflux disease without esophagitis: Secondary | ICD-10-CM | POA: Diagnosis not present

## 2016-12-21 DIAGNOSIS — J910 Malignant pleural effusion: Secondary | ICD-10-CM | POA: Diagnosis not present

## 2016-12-21 DIAGNOSIS — R630 Anorexia: Secondary | ICD-10-CM | POA: Diagnosis not present

## 2016-12-21 DIAGNOSIS — C649 Malignant neoplasm of unspecified kidney, except renal pelvis: Secondary | ICD-10-CM | POA: Diagnosis not present

## 2016-12-21 DIAGNOSIS — I10 Essential (primary) hypertension: Secondary | ICD-10-CM | POA: Diagnosis not present

## 2016-12-21 DIAGNOSIS — C7800 Secondary malignant neoplasm of unspecified lung: Secondary | ICD-10-CM | POA: Diagnosis not present

## 2016-12-21 DIAGNOSIS — C189 Malignant neoplasm of colon, unspecified: Secondary | ICD-10-CM | POA: Diagnosis not present

## 2016-12-21 DIAGNOSIS — E785 Hyperlipidemia, unspecified: Secondary | ICD-10-CM | POA: Diagnosis not present

## 2016-12-21 DIAGNOSIS — E039 Hypothyroidism, unspecified: Secondary | ICD-10-CM | POA: Diagnosis not present

## 2016-12-21 DIAGNOSIS — N401 Enlarged prostate with lower urinary tract symptoms: Secondary | ICD-10-CM | POA: Diagnosis not present

## 2016-12-21 DIAGNOSIS — R1310 Dysphagia, unspecified: Secondary | ICD-10-CM | POA: Diagnosis not present

## 2016-12-21 DIAGNOSIS — R634 Abnormal weight loss: Secondary | ICD-10-CM | POA: Diagnosis not present

## 2016-12-22 DIAGNOSIS — J910 Malignant pleural effusion: Secondary | ICD-10-CM | POA: Diagnosis not present

## 2016-12-22 DIAGNOSIS — R634 Abnormal weight loss: Secondary | ICD-10-CM | POA: Diagnosis not present

## 2016-12-22 DIAGNOSIS — C7800 Secondary malignant neoplasm of unspecified lung: Secondary | ICD-10-CM | POA: Diagnosis not present

## 2016-12-22 DIAGNOSIS — N401 Enlarged prostate with lower urinary tract symptoms: Secondary | ICD-10-CM | POA: Diagnosis not present

## 2016-12-22 DIAGNOSIS — C649 Malignant neoplasm of unspecified kidney, except renal pelvis: Secondary | ICD-10-CM | POA: Diagnosis not present

## 2016-12-22 DIAGNOSIS — R1310 Dysphagia, unspecified: Secondary | ICD-10-CM | POA: Diagnosis not present

## 2016-12-22 NOTE — Progress Notes (Signed)
SYON, TEWS ([id number]) Visit Report for 12/10/2016 Arrival Information Details Patient Name: Hunter Hardin, Hunter Hardin. Date of Service: 12/10/2016 10:30 AM Medical Record Number: [id number] Patient Account Number: [id number] Date of Birth/Sex: 06/01/1935 (82 y.o. Male) Treating RN: Ahmed Prima Primary Care Physician: Elsie Stain Other Clinician: Referring Physician: Elsie Stain Treating Physician/Extender: Cathie Olden in Treatment: 2 Visit Information History Since Last Visit All ordered tests and consults were completed: No Patient Arrived: Wheel Chair Added or deleted any medications: No Arrival Time: 10:17 Any new allergies or adverse reactions: No Accompanied By: cousin Had a fall or experienced change in No Transfer Assistance: [employer] activities of daily living that may affect Patient Lift risk of falls: Patient Identification Verified: Yes Signs or symptoms of abuse/neglect since last No Secondary Verification Process Yes visito Completed: Hospitalized since last visit: No Patient Requires Transmission- No Has Dressing in Place as Prescribed: Yes Based Precautions: Has Compression in Place as Prescribed: Yes Patient Has Alerts: Yes Pain Present Now: Yes Electronic Signature(s) Signed: 12/22/2016 2:01:11 PM By: Alric Quan Entered By: Alric Quan on 12/10/2016 10:18:39 Hunter Hardin ([id number]) -------------------------------------------------------------------------------- Encounter Discharge Information Details Patient Name: Hunter Hardin. Date of Service: 12/10/2016 10:30 AM Medical Record Number: [id number] Patient Account Number: [id number] Date of Birth/Sex: [birth year] (82 y.o. Male) Treating RN: Ahmed Prima Primary Care Physician: Elsie Stain Other Clinician: Referring Physician: Elsie Stain Treating Physician/Extender: Cathie Olden in Treatment: 2 Encounter Discharge Information Items Discharge Pain  Level: 0 Discharge Condition: Stable Ambulatory Status: Wheelchair Discharge Destination: Home Transportation: Private Auto Accompanied By: cousin Schedule Follow-up Appointment: Yes Medication Reconciliation completed and provided to Patient/Care Yes Kadence Mikkelson: Provided on Clinical Summary of Care: 12/10/2016 Form Type Recipient Paper Patient JS Electronic Signature(s) Signed: 12/10/2016 11:27:12 AM By: Ruthine Dose Entered By: Ruthine Dose on 12/10/2016 11:27:11 Hunter Hardin ([id number]) -------------------------------------------------------------------------------- Lower Extremity Assessment Details Patient Name: Hunter Hardin Date of Service: 12/10/2016 10:30 AM Medical Record Number: [id number] Patient Account Number: [id number] Date of Birth/Sex: 21-Jun-1935 (82 y.o. Male) Treating RN: Ahmed Prima Primary Care Physician: Elsie Stain Other Clinician: Referring Physician: Elsie Stain Treating Physician/Extender: Cathie Olden in Treatment: 2 Edema Assessment Assessed: [Left: No] [Right: No] E[Left: dema] [Right: :] Calf Left: Right: Point of Measurement: 33 cm From Medial Instep 30.2 cm 29.2 cm Ankle Left: Right: Point of Measurement: 10 cm From Medial Instep 20.6 cm 20.5 cm Vascular Assessment Pulses: Dorsalis Pedis Palpable: [Right:Yes] Posterior Tibial Extremity colors, hair growth, and conditions: Extremity Color: [Left:Hyperpigmented] [Right:Red] Temperature of Extremity: [Left:Warm] [Right:Warm] Capillary Refill: [Left:< 3 seconds] [Right:< 3 seconds] Toe Nail Assessment Left: Right: Thick: Yes Yes Discolored: Yes Yes Deformed: No No Improper Length and Hygiene: No No Electronic Signature(s) Signed: 12/22/2016 2:01:11 PM By: Alric Quan Entered By: Alric Quan on 12/10/2016 10:28:55 Hunter Hardin ([id number]) -------------------------------------------------------------------------------- Multi Wound Chart  Details Patient Name: Hunter Hardin. Date of Service: 12/10/2016 10:30 AM Medical Record Number: [id number] Patient Account Number: [id number] Date of Birth/Sex: 19-Sep-1935 (82 y.o. Male) Treating RN: Ahmed Prima Primary Care Physician: Elsie Stain Other Clinician: Referring Physician: Elsie Stain Treating Physician/Extender: Cathie Olden in Treatment: 2 Vital Signs Height(in): 67 Pulse(bpm): 64 Weight(lbs): 138 Blood Pressure 124/65 (mmHg): Body Mass Index(BMI): 22 Temperature(F): 97.5 Respiratory Rate 18 (breaths/min): Photos: Wound Location: Right Lower Leg - Left Malleolus - Medial Left Malleolus - Lateral Circumfernential Wounding Event: Gradually Appeared Gradually Appeared Gradually Appeared Primary Etiology: Venous Leg Ulcer Venous Leg Ulcer Venous Leg Ulcer Comorbid History: Osteoarthritis, Received Osteoarthritis, Received  Osteoarthritis, Received Chemotherapy Chemotherapy Chemotherapy Date Acquired: 11/09/2016 11/09/2016 11/09/2016 Weeks of Treatment: '2 2 2 '$ Wound Status: Open Open Open Clustered Wound: Yes Yes No Measurements L x W x D 18.5x21.3x0.1 1x1.5x0.1 1.6x0.5x0.1 (cm) Area (cm) : 309.486 1.178 0.628 Volume (cm) : 30.949 0.118 0.063 % Reduction in Area: -112.70% 74.10% 76.20% % Reduction in Volume: -112.70% 74.00% 76.10% Classification: Partial Thickness Partial Thickness Partial Thickness Exudate Amount: Large Large Large Exudate Type: Serous Serous Serous Exudate Color: amber amber amber Wound Margin: Flat and Intact Flat and Intact Flat and Intact Granulation Amount: Medium (34-66%) None Present (0%) Medium (34-66%) Granulation Quality: Red N/A Red Hunter Hardin, ELEY. ([id number]) Necrotic Amount: Medium (34-66%) Large (67-100%) Medium (34-66%) Exposed Structures: Fascia: No Fascia: No Fascia: No Fat: No Fat: No Fat: No Tendon: No Tendon: No Tendon: No Muscle: No Muscle: No Muscle: No Joint: No Joint: No Joint:  No Bone: No Bone: No Bone: No Limited to Skin Limited to Skin Limited to Skin Breakdown Breakdown Breakdown Epithelialization: Small (1-33%) Small (1-33%) None Periwound Skin Texture: Edema: No Edema: No Edema: No Excoriation: No Excoriation: No Excoriation: No Induration: No Induration: No Induration: No Callus: No Callus: No Callus: No Crepitus: No Crepitus: No Crepitus: No Fluctuance: No Fluctuance: No Fluctuance: No Friable: No Friable: No Friable: No Rash: No Rash: No Rash: No Scarring: No Scarring: No Scarring: No Periwound Skin Maceration: Yes Moist: Yes Moist: Yes Moisture: Moist: Yes Maceration: No Maceration: No Dry/Scaly: No Dry/Scaly: No Dry/Scaly: No Periwound Skin Color: Erythema: Yes Erythema: Yes Erythema: Yes Atrophie Blanche: No Atrophie Blanche: No Atrophie Blanche: No Cyanosis: No Cyanosis: No Cyanosis: No Ecchymosis: No Ecchymosis: No Ecchymosis: No Hemosiderin Staining: No Hemosiderin Staining: No Hemosiderin Staining: No Mottled: No Mottled: No Mottled: No Pallor: No Pallor: No Pallor: No Rubor: No Rubor: No Rubor: No Erythema Location: Circumferential Circumferential Circumferential Temperature: No Abnormality No Abnormality No Abnormality Tenderness on Yes Yes Yes Palpation: Wound Preparation: Ulcer Cleansing: Ulcer Cleansing: Ulcer Cleansing: Rinsed/Irrigated with Rinsed/Irrigated with Rinsed/Irrigated with Saline Saline Saline Topical Anesthetic Topical Anesthetic Topical Anesthetic Applied: Other: lidocaine Applied: Other: lidocaine Applied: Other: lidocaine 4% 4% 4% Treatment Notes Wound #1 (Right, Circumferential Lower Leg) 1. Cleansed with: Clean wound with Normal Saline Cleanse wound with antibacterial soap and water 2. Anesthetic Topical Lidocaine 4% cream to wound bed prior to debridement Hunter Hardin, Hunter Hardin ([id number]) 4. Dressing Applied: Foam Other dressing (specify in notes) 7. Secured  with Tape Notes oil emulsion, kerlix, coban Wound #2 (Left, Medial Malleolus) 1. Cleansed with: Clean wound with Normal Saline Cleanse wound with antibacterial soap and water 2. Anesthetic Topical Lidocaine 4% cream to wound bed prior to debridement 4. Dressing Applied: Foam Other dressing (specify in notes) 7. Secured with Tape Notes oil emulsion, kerlix, coban Wound #3 (Left, Lateral Malleolus) 1. Cleansed with: Clean wound with Normal Saline Cleanse wound with antibacterial soap and water 2. Anesthetic Topical Lidocaine 4% cream to wound bed prior to debridement 4. Dressing Applied: Foam Other dressing (specify in notes) 7. Secured with Tape Notes oil emulsion, kerlix, coban Electronic Signature(s) Signed: 12/10/2016 11:08:54 AM By: Rene Kocher, NP, Leah Entered By: Rene Kocher, NP, Leah on 12/10/2016 11:08:54 Hunter Hardin ([id number]) -------------------------------------------------------------------------------- [place] Details Patient Name: Hunter Hardin, BERBERIAN. Date of Service: 12/10/2016 10:30 AM Medical Record Number: [id number] Patient Account Number: [id number] Date of Birth/Sex: 10-Apr-1935 (82 y.o. Male) Treating RN: Ahmed Prima Primary Care Physician: Elsie Stain Other Clinician: Referring Physician: Elsie Stain Treating Physician/Extender: Lawanda Cousins Weeks in Treatment: 2  Active Inactive Abuse / Safety / Falls / Self Care Management Nursing Diagnoses: Impaired physical mobility Potential for falls Goals: Patient will remain injury free Date Initiated: 11/26/2016 Goal Status: Active Interventions: Assess fall risk on admission and as needed Notes: Nutrition Nursing Diagnoses: Potential for alteratiion in Nutrition/Potential for imbalanced nutrition Goals: Patient/caregiver agrees to and verbalizes understanding of need to use nutritional supplements and/or vitamins as prescribed Date Initiated: 11/26/2016 Goal  Status: Active Interventions: Assess patient nutrition upon admission and as needed per policy Notes: Orientation to the Wound Care Program Nursing Diagnoses: Knowledge deficit related to the wound healing center program Goals: Hunter Hardin, Hunter Hardin ([id number]) Patient/caregiver will verbalize understanding of the [place] Program Date Initiated: 11/26/2016 Goal Status: Active Interventions: Provide education on orientation to the wound center Notes: Venous Leg Ulcer Nursing Diagnoses: Potential for venous Insuffiency (use before diagnosis confirmed) Goals: Non-invasive venous studies are completed as ordered Date Initiated: 11/26/2016 Goal Status: Active Patient will maintain optimal edema control Date Initiated: 11/26/2016 Goal Status: Active Interventions: Assess peripheral edema status every visit. Notes: Wound/Skin Impairment Nursing Diagnoses: Impaired tissue integrity Goals: Patient/caregiver will verbalize understanding of skin care regimen Date Initiated: 11/26/2016 Goal Status: Active Ulcer/skin breakdown will have a volume reduction of 30% by week 4 Date Initiated: 11/26/2016 Goal Status: Active Ulcer/skin breakdown will have a volume reduction of 50% by week 8 Date Initiated: 11/26/2016 Goal Status: Active Ulcer/skin breakdown will have a volume reduction of 80% by week 12 Date Initiated: 11/26/2016 Goal Status: Active Ulcer/skin breakdown will heal within 14 weeks Date Initiated: 11/26/2016 Hunter Hardin, Hunter Hardin ([id number]) Goal Status: Active Interventions: Assess patient/caregiver ability to obtain necessary supplies Assess patient/caregiver ability to perform ulcer/skin care regimen upon admission and as needed Assess ulceration(s) every visit Notes: Electronic Signature(s) Signed: 12/22/2016 2:01:11 PM By: Alric Quan Entered By: Alric Quan on 12/10/2016 10:43:35 Hunter Hardin  ([id number]) -------------------------------------------------------------------------------- Pain Assessment Details Patient Name: Hunter Hardin. Date of Service: 12/10/2016 10:30 AM Medical Record Number: [id number] Patient Account Number: [id number] Date of Birth/Sex: [birth year] (82 y.o. Male) Treating RN: Ahmed Prima Primary Care Physician: Elsie Stain Other Clinician: Referring Physician: Elsie Stain Treating Physician/Extender: Cathie Olden in Treatment: 2 Active Problems Location of Pain Severity and Description of Pain Patient Has Paino Yes Site Locations Pain Location: Pain in Ulcers With Dressing Change: Yes Duration of the Pain. Constant / Intermittento Constant Rate the pain. Current Pain Level: 5 Worst Pain Level: 10 Least Pain Level: 3 Character of Pain Describe the Pain: Aching, Burning Pain Management and Medication Current Pain Management: Electronic Signature(s) Signed: 12/22/2016 2:01:11 PM By: Alric Quan Entered By: Alric Quan on 12/10/2016 10:19:00 Hunter Hardin ([id number]) -------------------------------------------------------------------------------- Patient/Caregiver Education Details Patient Name: Hunter Hardin Date of Service: 12/10/2016 10:30 AM Medical Record Number: [id number] Patient Account Number: [id number] Date of Birth/Gender: 1935/05/28 (82 y.o. Male) Treating RN: Ahmed Prima Primary Care Physician: Elsie Stain Other Clinician: Referring Physician: Elsie Stain Treating Physician/Extender: Cathie Olden in Treatment: 2 Education Assessment Education Provided To: Patient Education Topics Provided Wound/Skin Impairment: Handouts: Other: change dressing as ordered and do not get dressing wet Methods: Demonstration, Explain/Verbal Responses: State content correctly Electronic Signature(s) Signed: 12/22/2016 2:01:11 PM By: Alric Quan Entered By: Alric Quan on  12/10/2016 10:46:51 Hunter Hardin ([id number]) -------------------------------------------------------------------------------- Wound Assessment Details Patient Name: Hunter Hardin. Date of Service: 12/10/2016 10:30 AM Medical Record Number: [id number] Patient Account Number: [id number] Date of Birth/Sex: 1935/07/09 (82 y.o. Male) Treating RN: Ahmed Prima Primary Care Physician:  Elsie Stain Other Clinician: Referring Physician: Elsie Stain Treating Physician/Extender: Lawanda Cousins Weeks in Treatment: 2 Wound Status Wound Number: 1 Primary Venous Leg Ulcer Etiology: Wound Location: Right Lower Leg - Circumfernential Wound Status: Open Wounding Event: Gradually Appeared Comorbid Osteoarthritis, Received History: Chemotherapy Date Acquired: 11/09/2016 Weeks Of Treatment: 2 Clustered Wound: Yes Photos Photo Uploaded By: Alric Quan on 12/10/2016 10:47:49 Wound Measurements Length: (cm) 18.5 Width: (cm) 21.3 Depth: (cm) 0.1 Area: (cm) 309.486 Volume: (cm) 30.949 % Reduction in Area: -112.7% % Reduction in Volume: -112.7% Epithelialization: Small (1-33%) Tunneling: No Undermining: No Wound Description Classification: Partial Thickness Wound Margin: Flat and Intact Exudate Amount: Large Exudate Type: Serous Exudate Color: amber Foul Odor After Cleansing: No Wound Bed Granulation Amount: Medium (34-66%) Exposed Structure Granulation Quality: Red Fascia Exposed: No Necrotic Amount: Medium (34-66%) Fat Layer Exposed: No Hunter Hardin, Hunter Hardin ([id number]) Necrotic Quality: Adherent Slough Tendon Exposed: No Muscle Exposed: No Joint Exposed: No Bone Exposed: No Limited to Skin Breakdown Periwound Skin Texture Texture Color No Abnormalities Noted: No No Abnormalities Noted: No Callus: No Atrophie Blanche: No Crepitus: No Cyanosis: No Excoriation: No Ecchymosis: No Fluctuance: No Erythema: Yes Friable: No Erythema Location:  Circumferential Induration: No Hemosiderin Staining: No Localized Edema: No Mottled: No Rash: No Pallor: No Scarring: No Rubor: No Moisture Temperature / Pain No Abnormalities Noted: No Temperature: No Abnormality Dry / Scaly: No Tenderness on Palpation: Yes Maceration: Yes Moist: Yes Wound Preparation Ulcer Cleansing: Rinsed/Irrigated with Saline Topical Anesthetic Applied: Other: lidocaine 4%, Electronic Signature(s) Signed: 12/22/2016 2:01:11 PM By: Alric Quan Entered By: Alric Quan on 12/10/2016 10:35:40 Hunter Hardin ([id number]) -------------------------------------------------------------------------------- Wound Assessment Details Patient Name: Hunter Hardin Date of Service: 12/10/2016 10:30 AM Medical Record Number: [id number] Patient Account Number: [id number] Date of Birth/Sex: [birth year] (82 y.o. Male) Treating RN: Ahmed Prima Primary Care Physician: Elsie Stain Other Clinician: Referring Physician: Elsie Stain Treating Physician/Extender: Lawanda Cousins Weeks in Treatment: 2 Wound Status Wound Number: 2 Primary Venous Leg Ulcer Etiology: Wound Location: Left Malleolus - Medial Wound Status: Open Wounding Event: Gradually Appeared Comorbid Osteoarthritis, Received Date Acquired: 11/09/2016 History: Chemotherapy Weeks Of Treatment: 2 Clustered Wound: Yes Photos Photo Uploaded By: Alric Quan on 12/10/2016 10:48:07 Wound Measurements Length: (cm) 1 Width: (cm) 1.5 Depth: (cm) 0.1 Area: (cm) 1.178 Volume: (cm) 0.118 % Reduction in Area: 74.1% % Reduction in Volume: 74% Epithelialization: Small (1-33%) Tunneling: No Undermining: No Wound Description Classification: Partial Thickness Wound Margin: Flat and Intact Exudate Amount: Large Exudate Type: Serous Exudate Color: amber Foul Odor After Cleansing: No Wound Bed Granulation Amount: None Present (0%) Exposed Structure Necrotic Amount: Large  (67-100%) Fascia Exposed: No Necrotic Quality: Adherent Slough Fat Layer Exposed: No Tendon Exposed: No Hunter Hardin, BLOUCH. ([id number]) Muscle Exposed: No Joint Exposed: No Bone Exposed: No Limited to Skin Breakdown Periwound Skin Texture Texture Color No Abnormalities Noted: No No Abnormalities Noted: No Callus: No Atrophie Blanche: No Crepitus: No Cyanosis: No Excoriation: No Ecchymosis: No Fluctuance: No Erythema: Yes Friable: No Erythema Location: Circumferential Induration: No Hemosiderin Staining: No Localized Edema: No Mottled: No Rash: No Pallor: No Scarring: No Rubor: No Moisture Temperature / Pain No Abnormalities Noted: No Temperature: No Abnormality Dry / Scaly: No Tenderness on Palpation: Yes Maceration: No Moist: Yes Wound Preparation Ulcer Cleansing: Rinsed/Irrigated with Saline Topical Anesthetic Applied: Other: lidocaine 4%, Electronic Signature(s) Signed: 12/22/2016 2:01:11 PM By: Alric Quan Entered By: Alric Quan on 12/10/2016 10:37:05 Hunter Hardin ([id number]) -------------------------------------------------------------------------------- Wound Assessment Details Patient Name: Hunter Hardin Date  of Service: 12/10/2016 10:30 AM Medical Record Number: [id number] Patient Account Number: [id number] Date of Birth/Sex: [birth year] (82 y.o. Male) Treating RN: Ahmed Prima Primary Care Physician: Elsie Stain Other Clinician: Referring Physician: Elsie Stain Treating Physician/Extender: Lawanda Cousins Weeks in Treatment: 2 Wound Status Wound Number: 3 Primary Venous Leg Ulcer Etiology: Wound Location: Left Malleolus - Lateral Wound Status: Open Wounding Event: Gradually Appeared Comorbid Osteoarthritis, Received Date Acquired: 11/09/2016 History: Chemotherapy Weeks Of Treatment: 2 Clustered Wound: No Photos Photo Uploaded By: Alric Quan on 12/10/2016 10:48:29 Wound Measurements Length: (cm) 1.6 %  Reduction in Width: (cm) 0.5 % Reduction in Depth: (cm) 0.1 Epithelializat Area: (cm) 0.628 Tunneling: Volume: (cm) 0.063 Undermining: Area: 76.2% Volume: 76.1% ion: None No No Wound Description Classification: Partial Thickness Wound Margin: Flat and Intact Exudate Amount: Large Exudate Type: Serous Exudate Color: amber Foul Odor After Cleansing: No Wound Bed Granulation Amount: Medium (34-66%) Exposed Structure Granulation Quality: Red Fascia Exposed: No Necrotic Amount: Medium (34-66%) Fat Layer Exposed: No Necrotic Quality: Adherent Slough Tendon Exposed: No Hunter Hardin, Hunter Hardin ([id number]) Muscle Exposed: No Joint Exposed: No Bone Exposed: No Limited to Skin Breakdown Periwound Skin Texture Texture Color No Abnormalities Noted: No No Abnormalities Noted: No Callus: No Atrophie Blanche: No Crepitus: No Cyanosis: No Excoriation: No Ecchymosis: No Fluctuance: No Erythema: Yes Friable: No Erythema Location: Circumferential Induration: No Hemosiderin Staining: No Localized Edema: No Mottled: No Rash: No Pallor: No Scarring: No Rubor: No Moisture Temperature / Pain No Abnormalities Noted: No Temperature: No Abnormality Dry / Scaly: No Tenderness on Palpation: Yes Maceration: No Moist: Yes Wound Preparation Ulcer Cleansing: Rinsed/Irrigated with Saline Topical Anesthetic Applied: Other: lidocaine 4%, Electronic Signature(s) Signed: 12/22/2016 2:01:11 PM By: Alric Quan Entered By: Alric Quan on 12/10/2016 10:38:31 Hunter Hardin ([id number]) -------------------------------------------------------------------------------- Vitals Details Patient Name: Hunter Hardin Date of Service: 12/10/2016 10:30 AM Medical Record Number: [id number] Patient Account Number: [id number] Date of Birth/Sex: 1935/01/12 (82 y.o. Male) Treating RN: Ahmed Prima Primary Care Physician: Elsie Stain Other Clinician: Referring Physician: Elsie Stain Treating Physician/Extender: Cathie Olden in Treatment: 2 Vital Signs Time Taken: 10:19 Temperature (F): 97.5 Height (in): 67 Pulse (bpm): 64 Weight (lbs): 138 Respiratory Rate (breaths/min): 18 Body Mass Index (BMI): 21.6 Blood Pressure (mmHg): 124/65 Reference Range: 80 - 120 mg / dl Electronic Signature(s) Signed: 12/22/2016 2:01:11 PM By: Alric Quan Entered By: Alric Quan on 12/10/2016 10:21:57

## 2016-12-22 NOTE — Progress Notes (Signed)
RECE, ZECHMAN ([id number]) Visit Report for 12/10/2016 Chief Complaint Document Details Patient Name: Hunter Hardin, Hunter Hardin. Date of Service: 12/10/2016 10:30 AM Medical Record Number: [id number] Patient Account Number: [id number] Date of Birth/Sex: 1935-01-04 (82 y.o. Male) Treating RN: Ahmed Prima Primary Care Physician: Elsie Stain Other Clinician: Referring Physician: Elsie Stain Treating Physician/Extender: Cathie Olden in Treatment: 2 Information Obtained from: Patient Chief Complaint Patient presents to the wound care center for a evaluation of non healing wound to both lower extremities Electronic Signature(s) Signed: 12/10/2016 11:11:56 AM By: Rene Kocher, NP, Archer Moist Entered By: Rene Kocher, NP, Nabria Nevin on 12/10/2016 11:11:56 Hunter Hardin ([id number]) -------------------------------------------------------------------------------- Debridement Details Patient Name: Hunter Hardin Date of Service: 12/10/2016 10:30 AM Medical Record Number: [id number] Patient Account Number: [id number] Date of Birth/Sex: Feb 05, 1935 (82 y.o. Male) Treating RN: Ahmed Prima Primary Care Physician: Elsie Stain Other Clinician: Referring Physician: Elsie Stain Treating Physician/Extender: Cathie Olden in Treatment: 2 Debridement Performed for Wound #2 Left,Medial Malleolus Assessment: Performed By: Physician Lawanda Cousins, NP Debridement: Open Wound/Selective Debridement Selective Description: Pre-procedure Yes - 11:03 Verification/Time Out Taken: Start Time: 11:04 Pain Control: Lidocaine 4% Topical Solution Level: Skin/Dermis Total Area Debrided (L x 0.5 (cm) x 0.5 (cm) = 0.25 (cm) W): Tissue and other Non-Viable, Fibrin/Slough material debrided: Instrument: Curette Bleeding: Minimum Hemostasis Achieved: Pressure End Time: 11:05 Procedural Pain: 5 Post Procedural Pain: 1 Response to Treatment: Procedure was not tolerated well Comments: patient is  experiencing a tremendous amount of pain with minimal tactile stimuli, removal of crust and nonviable tissue was not tolerated well and the procedure was immediately stopped Post Debridement Measurements of Total Wound Length: (cm) 0.5 Width: (cm) 0.5 Depth: (cm) 0.1 Volume: (cm) 0.02 Character of Wound/Ulcer Post Improved Debridement: Severity of Tissue Post Debridement: Limited to breakdown of skin Post Procedure Diagnosis Same as Pre-procedure Hunter Hardin, Hunter Hardin ([id number]) Electronic Signature(s) Signed: 12/10/2016 11:11:24 AM By: Rene Kocher, NP, Griffey Nicasio Signed: 12/22/2016 2:01:11 PM By: Alric Quan Entered By: Rene Kocher, NP, Lucero Auzenne on 12/10/2016 11:11:24 Hunter Hardin ([id number]) -------------------------------------------------------------------------------- HPI Details Patient Name: Hunter Hardin Date of Service: 12/10/2016 10:30 AM Medical Record Number: [id number] Patient Account Number: [id number] Date of Birth/Sex: May 10, 1935 (82 y.o. Male) Treating RN: Ahmed Prima Primary Care Physician: Elsie Stain Other Clinician: Referring Physician: Elsie Stain Treating Physician/Extender: Cathie Olden in Treatment: 2 History of Present Illness Location: both lower extremities right worse than left Quality: Patient reports experiencing a dull pain to affected area(s). Severity: Patient states wound are getting worse. Duration: Patient has had the wound for < 3 weeks prior to presenting for treatment Timing: Pain in wound is Intermittent (comes and goes Context: The wound would happen gradually Modifying Factors: Other treatment(s) tried include:local ointments and local care Associated Signs and Symptoms: Patient reports having increase swelling. 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 Past medical history significant for essential hypertension, pleural effusion, colon cancer, hypothyroidism, renal cell cancer, hyperglycemia, thrombocytopenia, protein calorie malnutrition. the patient lives alone and has very little support and has a family friend who is helping him with his appointments. He has not seen a dermatologist for this condition recently. 12/03/2016 -- he continues to have a lot of pain and has not yet seen a dermatologist. 12-10-16 Hunter Hardin returns today for evaluation of his bilateral  lower strumming ulcerations. He has not made an appointment with dermatology as yet. He is accompanied by his cousin, she has accompanied him to all of his appointments and is inquiring about the need for dermatology in light of his overall state of health. He recently had a thoracentesis (12/02/2016) with removal of "2 canisters". Hunter. Hardin primary complaint is of pain to these ulcerations, he has been prescribed pain medication and takes that nightly. He is voicing no complaints regarding dressing changes or home health. Electronic Signature(s) Signed: 12/10/2016 11:33:57 AM By: Rene Kocher, NP, Taedyn Glasscock Previous Signature: 12/10/2016 11:19:03 AM Version By: Rene Kocher, NP, Jana Hakim By: Rene Kocher, NP, Wesam Gearhart on 12/10/2016 11:33:57 Hunter Hardin ([id number]) -------------------------------------------------------------------------------- Physical Exam Details Patient Name: Hunter Hardin Date of Service: 12/10/2016 10:30 AM Medical Record Number: [id number] Patient Account Number: [id number] Date of Birth/Sex: 09-29-1935 (82 y.o. Male) Treating RN: Ahmed Prima Primary Care Physician: Elsie Stain Other Clinician: Referring Physician: Elsie Stain Treating Physician/Extender: Lawanda Cousins Weeks in Treatment: 2 Constitutional BP within normal limits. afebrile. appears  underweight, appears chronically ill. Respiratory non-labored respiratory effort, intermittent cough. Cardiovascular non-palpable DP and PT. warm extremities; minimal edema present; cap refill less than or equal to 3 seconds. Integumentary (Hair, Skin) RLE lateral- superficial open area, distally there is an epithelial islands, peripherally there is no erythema, no thermal changes, extremely painful to direct tactile stimuli, no pain to periphery of ulcer; left medial malleolus - superficial ulcer and periphery covered with moist crust (easily removed with curette), no periwound erythema, no thermal changes, pain to wound, no pain to periwound; left lateral malleolus - superficial ulcer with dried drainage to peri-ulcer, this was removed easily with forceps revealing a much smaller ulceration, painful to direct tactile stimuli, no pain to periwound, no periwound erythema, no dermal changes. no induration, no fluctuance,. Psychiatric oriented to time, place, person and situation. slightly anxious, pleasant, conversive. Electronic Signature(s) Signed: 12/10/2016 11:33:35 AM By: Rene Kocher, NP, Jana Hakim By: Rene Kocher, NP, Laquisha Northcraft on 12/10/2016 11:33:35 Hunter Hardin ([id number]) -------------------------------------------------------------------------------- Physician Orders Details Patient Name: Hunter Hardin, LIMB. Date of Service: 12/10/2016 10:30 AM Medical Record Number: [id number] Patient Account Number: [id number] Date of Birth/Sex: May 09, 1935 (82 y.o. Male) Treating RN: Ahmed Prima Primary Care Physician: Elsie Stain Other Clinician: Referring Physician: Elsie Stain Treating Physician/Extender: Cathie Olden in Treatment: 2 Verbal / Phone Orders: Yes Clinician: Pinkerton, Debi Read Back and Verified: Yes Diagnosis Coding Wound Cleansing Wound #1 Right,Circumferential Lower Leg o Clean wound with Normal Saline. o Cleanse wound with mild soap and water Wound  #2 Left,Medial Malleolus o Clean wound with Normal Saline. o Cleanse wound with mild soap and water Wound #3 Left,Lateral Malleolus o Clean wound with Normal Saline. o Cleanse wound with mild soap and water Anesthetic Wound #1 Right,Circumferential Lower Leg o Topical Lidocaine 4% cream applied to wound bed prior to debridement - clinic use Wound #2 Left,Medial Malleolus o Topical Lidocaine 4% cream applied to wound bed prior to debridement - clinic use Wound #3 Left,Lateral Malleolus o Topical Lidocaine 4% cream applied to wound bed prior to debridement - clinic use Primary Wound Dressing Wound #1 Right,Circumferential Lower Leg o Foam - non adherent foam o Other: - contact layer- non-adherent strips oil emulsion (pt prefers) or adaptic Wound #2 Left,Medial Malleolus o Foam - non adherent foam o Other: - contact layer- non-adherent strips oil emulsion (pt prefers) or adaptic Wound #3 Left,Lateral Malleolus o Foam - non adherent foam Hunter Hardin, Hunter Hardin ([id number]) o Other: - contact layer-  non-adherent strips oil emulsion (pt prefers) or adaptic Secondary Dressing Wound #1 Right,Circumferential Lower Leg o ABD pad - if needed for drainage Wound #2 Left,Medial Malleolus o ABD pad - if needed for drainage Wound #3 Left,Lateral Malleolus o ABD pad - if needed for drainage Dressing Change Frequency Wound #1 Right,Circumferential Lower Leg o Dressing is to be changed Monday and Thursday. Wound #2 Left,Medial Malleolus o Dressing is to be changed Monday and Thursday. Wound #3 Left,Lateral Malleolus o Dressing is to be changed Monday and Thursday. Follow-up Appointments Wound #1 Right,Circumferential Lower Leg o Return Appointment in 1 week. Wound #2 Left,Medial Malleolus o Return Appointment in 1 week. Wound #3 Left,Lateral Malleolus o Return Appointment in 1 week. Edema Control Wound #1 Right,Circumferential Lower Leg o Kerlix  and Coban - Bilateral - lightly Wound #2 Left,Medial Malleolus o Kerlix and Coban - Bilateral - lightly Wound #3 Left,Lateral Malleolus o Kerlix and Coban - Bilateral - lightly Additional Orders / Instructions Wound #1 Right,Circumferential Lower Leg o Increase protein intake. Hunter Hardin, Hunter Hardin ([id number]) Wound #2 Left,Medial Malleolus o Increase protein intake. Wound #3 Left,Lateral Malleolus o Increase protein intake. Home Health Wound #1 Right,Circumferential Lower Leg o Continue Home Health Visits - Evaluate for any therapy needs patient has and get that order from patient's PCP Dr Elsie Stain o Home Health Nurse may visit PRN to address patientos wound care needs. o FACE TO FACE ENCOUNTER: MEDICARE and MEDICAID PATIENTS: I certify that this patient is under my care and that I had a face-to-face encounter that meets the physician face-to-face encounter requirements with this patient on this date. The encounter with the patient was in whole or in part for the following MEDICAL CONDITION: (primary reason for [place]) MEDICAL NECESSITY: I certify, that based on my findings, NURSING services are a medically necessary home health service. HOME BOUND STATUS: I certify that my clinical findings support that this patient is homebound (i.e., Due to illness or injury, pt requires aid of supportive devices such as crutches, cane, wheelchairs, walkers, the use of special transportation or the assistance of another person to leave their place of residence. There is a normal inability to leave the home and doing so requires considerable and taxing effort. Other absences are for medical reasons / religious services and are infrequent or of short duration when for other reasons). o If current dressing causes regression in wound condition, may D/C ordered dressing product/s and apply Normal Saline Moist Dressing daily until next [place] / Other  MD appointment. [place] of regression in wound condition at [phone]. o Please direct any NON-WOUND related issues/requests for orders to patient's Primary Care Physician Wound #2 [place] Visits - Evaluate for any therapy needs patient has and get that order from patient's PCP Dr Elsie Stain o Home Health Nurse may visit PRN to address patientos wound care needs. o FACE TO FACE ENCOUNTER: MEDICARE and MEDICAID PATIENTS: I certify that this patient is under my care and that I had a face-to-face encounter that meets the physician face-to-face encounter requirements with this patient on this date. The encounter with the patient was in whole or in part for the following MEDICAL CONDITION: (primary reason for [place]) MEDICAL NECESSITY: I certify, that based on my findings, NURSING services are a medically necessary home health service. HOME BOUND STATUS: I certify that my clinical findings support that this patient is homebound (i.e., Due to illness or injury, pt requires aid  of supportive devices such as crutches, cane, wheelchairs, walkers, the use of special transportation or the assistance of another person to leave their place of residence. There is a normal inability to leave the home and doing so requires considerable and taxing effort. Other absences are for medical reasons / religious services and are infrequent or of short duration when for other reasons). o If current dressing causes regression in wound condition, may D/C ordered dressing product/s and apply Normal Saline Moist Dressing daily until next [place] / Other MD appointment. [place] of regression in wound condition at [phone]. Hunter Hardin, Hunter Hardin ([id number]) o Please direct any NON-WOUND related issues/requests for orders to patient's Primary Care Physician Wound #3 [place] Visits - Evaluate for any therapy needs patient has and get that order from patient's PCP Dr Elsie Stain o Home Health Nurse may visit PRN to address patientos wound care needs. o FACE TO FACE ENCOUNTER: MEDICARE and MEDICAID PATIENTS: I certify that this patient is under my care and that I had a face-to-face encounter that meets the physician face-to-face encounter requirements with this patient on this date. The encounter with the patient was in whole or in part for the following MEDICAL CONDITION: (primary reason for [place]) MEDICAL NECESSITY: I certify, that based on my findings, NURSING services are a medically necessary home health service. HOME BOUND STATUS: I certify that my clinical findings support that this patient is homebound (i.e., Due to illness or injury, pt requires aid of supportive devices such as crutches, cane, wheelchairs, walkers, the use of special transportation or the assistance of another person to leave their place of residence. There is a normal inability to leave the home and doing so requires considerable and taxing effort. Other absences are for medical reasons / religious services and are infrequent or of short duration when for other reasons). o If current dressing causes regression in wound condition, may D/C ordered dressing product/s and apply Normal Saline Moist Dressing daily until next [place] / Other MD appointment. [place] of regression in wound condition at [phone]. o Please direct any NON-WOUND related issues/requests for orders to patient's Primary Care Physician Electronic Signature(s) Signed: 12/10/2016 11:34:22 AM By: Rene Kocher, NP, Elvia Aydin Entered By: Rene Kocher, NP, Clide Remmers on 12/10/2016 11:34:20 Hunter Hardin ([id number]) -------------------------------------------------------------------------------- Problem List Details Patient Name: Hunter Hardin Date of Service:  12/10/2016 10:30 AM Medical Record Number: [id number] Patient Account Number: [id number] Date of Birth/Sex: 08-Sep-1935 (82 y.o. 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 limited to 11/26/2016 Yes breakdown of skin I89.0 Lymphedema, not elsewhere classified 11/26/2016 Yes E44.0 Moderate protein-calorie malnutrition 11/26/2016 Yes L13.9 Bullous disorder, unspecified 11/26/2016 Yes Z85.528 Personal history of other malignant neoplasm of kidney 11/26/2016 Yes Inactive Problems Resolved Problems Electronic Signature(s) Signed: 12/10/2016 11:08:45 AM By: Rene Kocher, NP, Dymon [place]hill Entered By: Rene Kocher, NP, Antonina Deziel on 12/10/2016 11:08:45 Hunter Hardin ([id number]) -------------------------------------------------------------------------------- Progress Note Details Patient Name: Hunter Hardin Date of Service: 12/10/2016 10:30 AM Medical Record Number: [id number] Patient Account Number: [id number] Date of Birth/Sex: February 04, 1935 (82 y.o. Male) Treating RN: Ahmed Prima Primary Care Physician: Elsie Stain Other Clinician: Referring Physician: Elsie Stain Treating Physician/Extender: Lawanda Cousins  Weeks in Treatment: 2 Subjective Chief Complaint Information obtained from Patient Patient presents to the wound care center for a evaluation of non healing wound to both lower extremities History of Present Illness (HPI) The following HPI elements were documented for the patient's wound: Location: both lower extremities right worse than left Quality: Patient reports experiencing a dull pain to affected area(s). Severity: Patient  states wound are getting worse. Duration: Patient has had the wound for < 3 weeks prior to presenting for treatment Timing: Pain in wound is Intermittent (comes and goes Context: The wound would happen gradually Modifying Factors: Other treatment(s) tried include:local ointments and local care Associated Signs and Symptoms: Patient reports having increase swelling. 82 year old patient with a history of colon cancer status post right hemicolectomy and stage IV renal cell carcinoma with metastasis to the lung and bone also has hypertension, chronic bilateral pleural effusion and chronic swelling in his legs which are now getting worse. Past medical history significant for essential hypertension, pleural effusion, colon cancer, hypothyroidism, renal cell cancer, hyperglycemia, thrombocytopenia, protein calorie malnutrition. the patient lives alone and has very little support and has a family friend who is helping him with his appointments. He has not seen a dermatologist for this condition recently. 12/03/2016 -- he continues to have a lot of pain and has not yet seen a dermatologist. 12-10-16 Hunter Taniguchi returns today for evaluation of his bilateral lower strumming ulcerations. He has not made an appointment with dermatology as yet. He is accompanied by his cousin, she has accompanied him to all of his appointments and is inquiring about the need for dermatology in light of his overall state of health. He recently had a thoracentesis (12/02/2016) with removal of "2 canisters". Hunter. Kurtzman primary complaint is of pain to these ulcerations, he has been prescribed pain medication and takes that nightly. He is voicing no complaints regarding dressing changes or home health. Objective Hunter Hardin, Hunter Hardin ([id number]) Constitutional BP within normal limits. afebrile. appears underweight, appears chronically ill. Vitals Time Taken: 10:19 AM, Height: 67 in, Weight: 138 lbs, BMI: 21.6, Temperature:  97.5 F, Pulse: 64 bpm, Respiratory Rate: 18 breaths/min, Blood Pressure: 124/65 mmHg. Respiratory non-labored respiratory effort, intermittent cough. Cardiovascular non-palpable DP and PT. warm extremities; minimal edema present; cap refill less than or equal to 3 seconds. Psychiatric oriented to time, place, person and situation. slightly anxious, pleasant, conversive. Integumentary (Hair, Skin) RLE lateral- superficial open area, distally there is an epithelial islands, peripherally there is no erythema, no thermal changes, extremely painful to direct tactile stimuli, no pain to periphery of ulcer; left medial malleolus - superficial ulcer and periphery covered with moist crust (easily removed with curette), no periwound erythema, no thermal changes, pain to wound, no pain to periwound; left lateral malleolus - superficial ulcer with dried drainage to peri-ulcer, this was removed easily with forceps revealing a much smaller ulceration, painful to direct tactile stimuli, no pain to periwound, no periwound erythema, no dermal changes. no induration, no fluctuance,. Wound #1 status is Open. Original cause of wound was Gradually Appeared. The wound is located on the Right,Circumferential Lower Leg. The wound measures 18.5cm length x 21.3cm width x 0.1cm depth; 309.486cm^2 area and 30.949cm^3 volume. The wound is limited to skin breakdown. There is no tunneling or undermining noted. There is a large amount of serous drainage noted. The wound margin is flat and intact. There is medium (34-66%) red granulation within the wound bed. There is a medium (34-66%) amount of necrotic tissue within the wound  bed including Adherent Slough. The periwound skin appearance exhibited: Maceration, Moist, Erythema. The periwound skin appearance did not exhibit: Callus, Crepitus, Excoriation, Fluctuance, Friable, Induration, Localized Edema, Rash, Scarring, Dry/Scaly, Atrophie Blanche, Cyanosis, Ecchymosis,  Hemosiderin Staining, Mottled, Pallor, Rubor. The surrounding wound skin color is noted with erythema which is circumferential. Periwound temperature was noted as No Abnormality. The periwound has tenderness on palpation. Wound #2 status is Open. Original cause of wound was Gradually Appeared. The wound is located on the Left,Medial Malleolus. The wound measures 1cm length x 1.5cm width x 0.1cm depth; 1.178cm^2 area and 0.118cm^3 volume. The wound is limited to skin breakdown. There is no tunneling or undermining noted. There is a large amount of serous drainage noted. The wound margin is flat and intact. There is no granulation within the wound bed. There is a large (67-100%) amount of necrotic tissue within the wound bed including Adherent Slough. The periwound skin appearance exhibited: Moist, Erythema. The periwound skin appearance did not exhibit: Callus, Crepitus, Excoriation, Fluctuance, Friable, Induration, Localized Edema, Rash, Scarring, Dry/Scaly, Maceration, Atrophie Blanche, Cyanosis, Ecchymosis, Hemosiderin Staining, Mottled, Pallor, Rubor. The surrounding wound skin color is noted with erythema which is circumferential. Periwound temperature was noted as No Abnormality. The periwound has tenderness on Hunter Hardin, Hunter Hardin. ([id number]) palpation. Wound #3 status is Open. Original cause of wound was Gradually Appeared. The wound is located on the Left,Lateral Malleolus. The wound measures 1.6cm length x 0.5cm width x 0.1cm depth; 0.628cm^2 area and 0.063cm^3 volume. The wound is limited to skin breakdown. There is no tunneling or undermining noted. There is a large amount of serous drainage noted. The wound margin is flat and intact. There is medium (34-66%) red granulation within the wound bed. There is a medium (34-66%) amount of necrotic tissue within the wound bed including Adherent Slough. The periwound skin appearance exhibited: Moist, Erythema. The periwound skin appearance did  not exhibit: Callus, Crepitus, Excoriation, Fluctuance, Friable, Induration, Localized Edema, Rash, Scarring, Dry/Scaly, Maceration, Atrophie Blanche, Cyanosis, Ecchymosis, Hemosiderin Staining, Mottled, Pallor, Rubor. The surrounding wound skin color is noted with erythema which is circumferential. Periwound temperature was noted as No Abnormality. The periwound has tenderness on palpation. 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 There was a Skin/Dermis Open Wound/Selective [phone]) debridement with total area of 0.25 sq cm performed by Lawanda Cousins, NP. with the following instrument(s): Curette to remove Non-Viable tissue/material including Fibrin/Slough after achieving pain control using Lidocaine 4% Topical Solution. A time out was conducted at 11:03, prior to the start of the procedure. A Minimum amount of bleeding was controlled with Pressure. The procedure was not tolerated well with a pain level of 5 throughout and a pain level of 1 following the procedure. Response to Treatment Comments: patient is experiencing a tremendous amount of pain with minimal tactile stimuli, removal of crust and nonviable tissue was not tolerated well and the procedure was immediately stopped. Post Debridement Measurements: 0.5cm length x 0.5cm width x 0.1cm depth; 0.02cm^3 volume. Character of Wound/Ulcer Post Debridement is improved. Severity of Tissue Post Debridement is: Limited to breakdown of skin. Post procedure Diagnosis Wound #2: Same as  Pre-Procedure Hunter Hardin, Hunter Hardin ([id number]) Plan Wound Cleansing: Wound #1 Right,Circumferential Lower Leg: Clean wound with Normal Saline. Cleanse wound with mild soap and water Wound #2 Left,Medial Malleolus: Clean wound with Normal Saline. Cleanse wound with mild soap and water Wound #3 Left,Lateral Malleolus: Clean wound with Normal Saline. Cleanse wound with mild soap and water Anesthetic: Wound #1 Right,Circumferential Lower Leg: Topical Lidocaine 4% cream applied to wound bed prior to debridement - clinic use Wound #2 Left,Medial Malleolus: Topical Lidocaine 4% cream applied to wound bed prior to debridement - clinic use Wound #3 Left,Lateral Malleolus: Topical Lidocaine 4% cream applied to wound bed prior to debridement - clinic use Primary Wound Dressing: Wound #1 Right,Circumferential Lower Leg: Foam - non adherent foam Other: - contact layer- non-adherent strips oil emulsion (pt prefers) or adaptic Wound #2 Left,Medial Malleolus: Foam - non adherent foam Other: - contact layer- non-adherent strips oil emulsion (pt prefers) or adaptic Wound #3 Left,Lateral Malleolus: Foam - non adherent foam Other: - contact layer- non-adherent strips oil emulsion (pt prefers) or adaptic Secondary Dressing: Wound #1 Right,Circumferential Lower Leg: ABD pad - if needed for drainage Wound #2 Left,Medial Malleolus: ABD pad - if needed for drainage Wound #3 Left,Lateral Malleolus: ABD pad - if needed for drainage Dressing Change Frequency: Wound #1 Right,Circumferential Lower Leg: Dressing is to be changed Monday and Thursday. Wound #2 Left,Medial Malleolus: Dressing is to be changed Monday and Thursday. Wound #3 Left,Lateral Malleolus: Dressing is to be changed Monday and Thursday. Follow-up Appointments: Hunter Hardin, Hunter Hardin ([id number]) Wound #1 Right,Circumferential Lower Leg: Return Appointment in 1 week. Wound #2 Left,Medial Malleolus: Return Appointment in 1 week. Wound #3  Left,Lateral Malleolus: Return Appointment in 1 week. Edema Control: Wound #1 Right,Circumferential Lower Leg: Kerlix and Coban - Bilateral - lightly Wound #2 Left,Medial Malleolus: Kerlix and Coban - Bilateral - lightly Wound #3 Left,Lateral Malleolus: Kerlix and Coban - Bilateral - lightly Additional Orders / Instructions: Wound #1 Right,Circumferential Lower Leg: Increase protein intake. Wound #2 Left,Medial Malleolus: Increase protein intake. Wound #3 Left,Lateral Malleolus: Increase protein intake. Home Health: Wound #1 Right,Circumferential Lower Leg: Continue Home Health Visits - Evaluate for any therapy needs patient has and get that order from patient's PCP Dr Renard Matter Nurse may visit PRN to address patient s wound care needs. FACE TO FACE ENCOUNTER: MEDICARE and MEDICAID PATIENTS: I certify that this patient is under my care and that I had a face-to-face encounter that meets the physician face-to-face encounter requirements with this patient on this date. The encounter with the patient was in whole or in part for the following MEDICAL CONDITION: (primary reason for [place]) MEDICAL NECESSITY: I certify, that based on my findings, NURSING services are a medically necessary home health service. HOME BOUND STATUS: I certify that my clinical findings support that this patient is homebound (i.e., Due to illness or injury, pt requires aid of supportive devices such as crutches, cane, wheelchairs, walkers, the use of special transportation or the assistance of another person to leave their place of residence. There is a normal inability to leave the home and doing so requires considerable and taxing effort. Other absences are for medical reasons / religious services and are infrequent or of short duration when for other reasons). If current dressing causes regression in wound condition, may D/C ordered dressing product/s and apply Normal Saline Moist  Dressing daily until next [place] / Other MD appointment. [place] of regression in wound condition at [phone]. Please  direct any NON-WOUND related issues/requests for orders to patient's Primary Care Physician Wound #2 Left,Medial Malleolus: [place] Visits - Evaluate for any therapy needs patient has and get that order from patient's PCP Dr Phillip [facility]al [facility] Nurse may visit PRN to address patient s wound care needs. FACE TO FACE ENCOUNTER: MEDICARE and MEDICAID PATIENTS: I certify that this patient is under my care and that I had a face-to-face encounter that meets the physician face-to-face encounter requirements with this patient on this date. The encounter with the patient was in whole or in part for the following MEDICAL CONDITION: (primary reason for [place]) MEDICAL NECESSITY: I certify, that based on my findings, NURSING services are a medically necessary home health service. HOME BOUND STATUS: I certify that my clinical findings support that this patient is homebound (i.e., Due to illness or injury, pt requires aid of supportive devices such as crutches, cane, wheelchairs, walkers, the use of special transportation or the assistance of another person to leave their place of residence. There is a JILL, RUPPE ([id number]) normal inability to leave the home and doing so requires considerable and taxing effort. Other absences are for medical reasons / religious services and are infrequent or of short duration when for other reasons). If current dressing causes regression in wound condition, may D/C ordered dressing product/s and apply Normal Saline Moist Dressing daily until next [place] / Other MD appointment. [place] of regression in wound condition at [phone]. Please direct any NON-WOUND related issues/requests for orders to patient's Primary Care Physician Wound #3 Left,Lateral  Malleolus: [place] Visits - Evaluate for any therapy needs patient has and get that order from patient's PCP Dr Phillip [facility]al [facility] Nurse may visit PRN to address patient s wound care needs. FACE TO FACE ENCOUNTER: MEDICARE and MEDICAID PATIENTS: I certify that this patient is under my care and that I had a face-to-face encounter that meets the physician face-to-face encounter requirements with this patient on this date. The encounter with the patient was in whole or in part for the following MEDICAL CONDITION: (primary reason for [place]) MEDICAL NECESSITY: I certify, that based on my findings, NURSING services are a medically necessary home health service. HOME BOUND STATUS: I certify that my clinical findings support that this patient is homebound (i.e., Due to illness or injury, pt requires aid of supportive devices such as crutches, cane, wheelchairs, walkers, the use of special transportation or the assistance of another person to leave their place of residence. There is a normal inability to leave the home and doing so requires considerable and taxing effort. Other absences are for medical reasons / religious services and are infrequent or of short duration when for other reasons). If current dressing causes regression in wound condition, may D/C ordered dressing product/s and apply Normal Saline Moist Dressing daily until next [place] / Other MD appointment. [place] of regression in wound condition at [phone]. Please direct any NON-WOUND related issues/requests for orders to patient's Primary Care Physician Follow-Up Appointments: A follow-up appointment should be scheduled. Medication Reconciliation completed and provided to Patient/Care Provider. A Patient Clinical Summary of Care was provided to [place] 1. Continue his appointment with dermatology 2. Continue with nonadherent foam dressing changes and home health 3. Return  next week for follow-up appointment Electronic Signature(s) Signed: 12/10/2016 11:36:10 AM By: Rene Kocher, NP, Wendee Hata Entered By: Rene Kocher, NP, Aaren Atallah on 12/10/2016 11:36:10 Hunter Hardin ([id number]) --------------------------------------------------------------------------------  SuperBill Details Patient Name: WETZEL, MEESTER. Date of Service: 12/10/2016 Medical Record Number: [id number] Patient Account Number: [id number] Date of Birth/Sex: [birth year] (82 y.o. 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 I89.0 Lymphedema, not elsewhere classified E44.0 Moderate protein-calorie malnutrition L13.9 Bullous disorder, unspecified Z85.528 Personal history of other malignant neoplasm of kidney Facility Procedures CPT4 Code Description: [id number] - DEBRIDE WOUND 1ST 20 SQ CM OR < ICD-10 Description Diagnosis L97.321 Non-pressure chronic ulcer of left ankle limited to Modifier: breakdown of Quantity: 1 skin Physician Procedures CPT4 Code Description: [id number] - WC PHYS DEBR WO ANESTH 20 SQ CM ICD-10 Description Diagnosis L97.321 Non-pressure chronic ulcer of left ankle limited to b Modifier: reakdown of Quantity: 1 skin Electronic Signature(s) Signed: 12/10/2016 11:44:29 AM By: Rene Kocher, NP, Alyxandra Tenbrink Entered By: Rene Kocher, NP, Levana Minetti on 12/10/2016 11:44:29

## 2016-12-23 ENCOUNTER — Telehealth: Payer: Self-pay | Admitting: Oncology

## 2016-12-23 ENCOUNTER — Other Ambulatory Visit (HOSPITAL_BASED_OUTPATIENT_CLINIC_OR_DEPARTMENT_OTHER)

## 2016-12-23 ENCOUNTER — Ambulatory Visit (HOSPITAL_BASED_OUTPATIENT_CLINIC_OR_DEPARTMENT_OTHER): Payer: Medicare Other | Admitting: Oncology

## 2016-12-23 VITALS — BP 116/59 | HR 81 | Resp 16 | Ht 71.0 in

## 2016-12-23 DIAGNOSIS — R634 Abnormal weight loss: Secondary | ICD-10-CM

## 2016-12-23 DIAGNOSIS — C649 Malignant neoplasm of unspecified kidney, except renal pelvis: Secondary | ICD-10-CM

## 2016-12-23 DIAGNOSIS — R197 Diarrhea, unspecified: Secondary | ICD-10-CM | POA: Diagnosis not present

## 2016-12-23 DIAGNOSIS — C7800 Secondary malignant neoplasm of unspecified lung: Secondary | ICD-10-CM | POA: Diagnosis not present

## 2016-12-23 DIAGNOSIS — J90 Pleural effusion, not elsewhere classified: Secondary | ICD-10-CM

## 2016-12-23 DIAGNOSIS — N401 Enlarged prostate with lower urinary tract symptoms: Secondary | ICD-10-CM | POA: Diagnosis not present

## 2016-12-23 DIAGNOSIS — R1310 Dysphagia, unspecified: Secondary | ICD-10-CM | POA: Diagnosis not present

## 2016-12-23 DIAGNOSIS — Z85038 Personal history of other malignant neoplasm of large intestine: Secondary | ICD-10-CM | POA: Diagnosis not present

## 2016-12-23 DIAGNOSIS — J910 Malignant pleural effusion: Secondary | ICD-10-CM | POA: Diagnosis not present

## 2016-12-23 LAB — COMPREHENSIVE METABOLIC PANEL
ALBUMIN: 2.2 g/dL — AB (ref 3.5–5.0)
ALK PHOS: 75 U/L (ref 40–150)
ALT: 16 U/L (ref 0–55)
AST: 16 U/L (ref 5–34)
Anion Gap: 9 mEq/L (ref 3–11)
BUN: 27.4 mg/dL — AB (ref 7.0–26.0)
CALCIUM: 8.5 mg/dL (ref 8.4–10.4)
CHLORIDE: 102 meq/L (ref 98–109)
CO2: 24 mEq/L (ref 22–29)
CREATININE: 1.2 mg/dL (ref 0.7–1.3)
EGFR: 55 mL/min/{1.73_m2} — ABNORMAL LOW (ref >90)
GLUCOSE: 125 mg/dL (ref 70–140)
POTASSIUM: 4.1 meq/L (ref 3.5–5.1)
SODIUM: 135 meq/L — AB (ref 136–145)
Total Bilirubin: 0.61 mg/dL (ref 0.20–1.20)
Total Protein: 5.9 g/dL — ABNORMAL LOW (ref 6.4–8.3)

## 2016-12-23 LAB — CBC WITH DIFFERENTIAL/PLATELET
BASO%: 0.2 % (ref 0.0–2.0)
BASOS ABS: 0 10*3/uL (ref 0.0–0.1)
EOS%: 2.6 % (ref 0.0–7.0)
Eosinophils Absolute: 0.2 10*3/uL (ref 0.0–0.5)
HEMATOCRIT: 38.3 % — AB (ref 38.4–49.9)
HEMOGLOBIN: 12.7 g/dL — AB (ref 13.0–17.1)
LYMPH#: 1.6 10*3/uL (ref 0.9–3.3)
LYMPH%: 24.1 % (ref 14.0–49.0)
MCH: 33.7 pg — AB (ref 27.2–33.4)
MCHC: 33.2 g/dL (ref 32.0–36.0)
MCV: 101.6 fL — ABNORMAL HIGH (ref 79.3–98.0)
MONO#: 0.5 10*3/uL (ref 0.1–0.9)
MONO%: 8 % (ref 0.0–14.0)
NEUT#: 4.3 10*3/uL (ref 1.5–6.5)
NEUT%: 65.1 % (ref 39.0–75.0)
Platelets: 116 10*3/uL — ABNORMAL LOW (ref 140–400)
RBC: 3.77 10*6/uL — ABNORMAL LOW (ref 4.20–5.82)
RDW: 15.6 % — AB (ref 11.0–14.6)
WBC: 6.5 10*3/uL (ref 4.0–10.3)

## 2016-12-23 NOTE — Progress Notes (Signed)
Hematology and Oncology Follow Up Visit  Hunter Hardin [id number] Dec 28, 1934 82 y.o. 12/23/2016 8:59 AM   Principle Diagnosis: 82 year old gentleman with:  1. T3 N0 colon cancer: He is status post a laparoscopic right hemicolectomy, with 0 out of 21 lymph nodes involved. That was done on 03/06/2013.  2. Renal cell carcinoma diagnosed in May of 2014. He presented with kidney mass and lung metastasis. 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  Prior therapy: Started Votrient 800 mg daily on 05/22/2013. Dose was reduced to 600 mg daily on 01/19/14 due to weight loss and diarrhea. Then reduced to 400 mg starting on 09/07/2014. The dose will be reduced further to 200 mg daily starting on 11/16/2014. The dose reduction is related to diarrhea and weight loss. Therapy discontinued in December 2016 for disease progression. Cabometyx 40 mg daily started in December 2016. This was changed to 20 mg daily in March 2017. Therapy discontinued in December 2017 because of progression of disease and hospice enrollment.  Current therapy: Supportive care and he is currently under hospice care.  Interim History: Hunter Hardin presents today for a follow up visit. Since the last visit, he was hospitalized on 12/12/2016 for increased dyspnea on exertion and reaccumulation of pleural effusion. He underwent a thoracentesis on 12/13/2016 with 1.1 L of fluid obtained. He also was enrolled in hospice since the last visit. Since his discharge, he reports some improvement in his clinical status with improved breathing and decreased dyspnea on exertion. He is limited in his performance status and chair bound at this time. His lower extremity edema is improving slowly at this time. His appetite remained poor but is able to get nutritional supplements periodically.   He is not report any headaches, blurry vision or syncope. He denies chest pain or dyspnea on  exertion. He denies abdominal pain. He does not report any hematochezia. He has not reported any pain in his hands or feet. Has not reported any fevers or chills or sweats.  He denied any arthralgias or myalgias. He denies any back pain or hip pain. He is not report any lymphadenopathy or petechiae.  Remainder of his review of systems unremarkable.   Medications: I have reviewed the patient's current medications. Reviewed today and unchanged. Current Outpatient Prescriptions  Medication Sig Dispense Refill  . cabozantinib S-Malate (CABOMETYX) 20 MG TABS Take 1 tablet by mouth daily. 30 tablet 0  . doxazosin (CARDURA) 8 MG tablet TAKE 1 TABLET DAILY 90 tablet 2  . finasteride (PROSCAR) 5 MG tablet Take 5 mg by mouth daily.    . fish oil-omega-3 fatty acids 1000 MG capsule Take 1 g by mouth 2 (two) times daily.    . furosemide (LASIX) 20 MG tablet Take 1 tablet (20 mg total) by mouth daily as needed for fluid. 30 tablet 1  . KLOR-CON M20 20 MEQ tablet TAKE 1 TABLET TWICE A DAY 180 tablet 1  . lactose free nutrition (BOOST) LIQD Take 237 mLs by mouth daily.    [name] levothyroxine (SYNTHROID, LEVOTHROID) 50 MCG tablet TAKE 2 TABLETS ON SUNDAY AND 1 TABLET ON ALL OTHER DAYS (8 TABLETS A WEEK TOTAL) 105 tablet 1  . loperamide (IMODIUM A-D) 2 MG tablet Take 2 mg by mouth as needed for diarrhea or loose stools.    . magnesium chloride (SLOW-MAG) 64 MG TBEC SR tablet Take 2 tablets (128 mg total) by mouth daily. 60 tablet 0  .  ondansetron (ZOFRAN) 8 MG tablet Take 1 tablet (8 mg total) by mouth every 8 (eight) hours as needed. for nausea 20 tablet 3  . propranolol (INDERAL) 40 MG tablet Take 0.5 tablets (20 mg total) by mouth 2 (two) times daily. 90 tablet 3  . traMADol (ULTRAM) 50 MG tablet Take 1 tablet (50 mg total) by mouth every 8 (eight) hours as needed. (Patient taking differently: Take 50 mg by mouth every 8 (eight) hours as needed for moderate pain. ) 30 tablet 1  . verapamil (CALAN-SR) 240 MG CR  tablet TAKE 1 TABLET DAILY 90 tablet 2  . vitamin C (ASCORBIC ACID) 500 MG tablet Take 500 mg by mouth daily.     No current facility-administered medications for this visit.     Allergies: No Known Allergies  Past Medical History, Surgical history, Social history, and Family History were reviewed and updated.    Physical Exam: Blood pressure (!) 116/59, pulse 81, resp. rate 16, height '5\' 11"'$  (1.803 m), SpO2 100 %. ECOG: 3 General appearance: Frail, elderly gentleman in a wheelchair. Head: Normocephalic, without obvious abnormality no oral ulcers or lesions. Neck: no adenopathy, no masses or lesions. No thyromegaly. Lymph nodes: Cervical, supraclavicular, and axillary nodes normal. Heart:regular rate and rhythm, S1, S2. Lung:chest clear, no wheezing, rales. Decreased breath sounds on the left side.  Chest wall examination: No tenderness or masses noted. Abdomen: soft, non-tender, without masses or organomegaly no rebound or guarding. EXT: Wounds are wrapped with pressure dressing. Decreased edema noted. Skin: No erythema or induration noted.   Lab Results: Lab Results  Component Value Date   WBC 6.5 12/23/2016   HGB 12.7 (L) 12/23/2016   HCT 38.3 (L) 12/23/2016   MCV 101.6 (H) 12/23/2016   PLT 116 (L) 12/23/2016     CBC    Component Value Date/Time   WBC 6.5 12/23/2016 0817   WBC 5.9 12/13/2016 0110   RBC 3.77 (L) 12/23/2016 0817   RBC 3.88 (L) 12/13/2016 0110   HGB 12.7 (L) 12/23/2016 0817   HCT 38.3 (L) 12/23/2016 0817   PLT 116 (L) 12/23/2016 0817   MCV 101.6 (H) 12/23/2016 0817   MCH 33.7 (H) 12/23/2016 0817   MCH 33.2 12/13/2016 0110   MCHC 33.2 12/23/2016 0817   MCHC 33.0 12/13/2016 0110   RDW 15.6 (H) 12/23/2016 0817   LYMPHSABS 1.6 12/23/2016 0817   MONOABS 0.5 12/23/2016 0817   EOSABS 0.2 12/23/2016 0817   BASOSABS 0.0 12/23/2016 2720     82 year old gentleman with the following issues.   1. Colon cancer: He is status post a laparoscopic right  hemicolectomy, with the pathology revealing a T3 N0, with 0 out of 21 lymph nodes involved. He did not receive adjuvant chemotherapy for colon cancer given that he has stage IV kidney cancer.    No further intervention is needed regarding his colon cancer.  2. Renal cell cancer: he has stage IV disease with lung involvment. He was initially treated with Votrient and subsequently developed progression of disease.   He has been on Cabometyx since December 2017. His dose has been reduced from 40 mg to 20 mg for better tolerance.  Given the overall decline in his performance status and likely disease progression, I have recommended discontinuation of all anticancer treatment. He is currently under hospice care which I agree with completely. He is not a candidate for any further treatments regarding his cancer.   3. Weight loss: Related to malignancy. Stopping anticancer treatment will help  temporarily. We have discussed different strategies to boost his appetite and he is willing to try some of that including Megace and nutritional supplements.  4. Diarrhea. Seems to be manageable with the dose reduction. He does require Imodium periodically. I anticipate this should improve in the next few weeks.  5. Urinary retention: His urinary symptoms are back to baseline at this time.  6. Lower leg edema: Continues to improve under the management of her wound care.  7. Pleural effusion/pneumothorax: He does have rapid reaccumulation of pleural effusion and would benefit from palliative insertion of a Pleurx catheter. I'll ask interventional radiology to kindly evaluate him for possible insertion.  8. Prognosis: This was discussed with the patient and his friends and accompanied him today again. He has an incurable malignancy with limited life expectancy. I have recommended continuing hospice care and discontinuation of all anticancer treatment. I anticipate his life expectancy of less than 6 months. I  anticipate temporary thriving after discontinuation of anticancer medicine and/or hospice care.  9. Followup: In 6 weeks. He knows to cancel this appointment if he is unable to make it to physical decline.       [facility], MD 1/3/20188:59 AM

## 2016-12-23 NOTE — Telephone Encounter (Signed)
Appointments scheduled per 1/3 LOS. Patient given AVS report and calendars with future scheduled appointments.

## 2016-12-24 ENCOUNTER — Telehealth: Payer: Self-pay

## 2016-12-24 ENCOUNTER — Telehealth: Payer: Self-pay | Admitting: Family Medicine

## 2016-12-24 ENCOUNTER — Encounter: Attending: Surgery | Admitting: Surgery

## 2016-12-24 DIAGNOSIS — N401 Enlarged prostate with lower urinary tract symptoms: Secondary | ICD-10-CM | POA: Diagnosis not present

## 2016-12-24 DIAGNOSIS — L139 Bullous disorder, unspecified: Secondary | ICD-10-CM | POA: Insufficient documentation

## 2016-12-24 DIAGNOSIS — R634 Abnormal weight loss: Secondary | ICD-10-CM | POA: Diagnosis not present

## 2016-12-24 DIAGNOSIS — Z87891 Personal history of nicotine dependence: Secondary | ICD-10-CM | POA: Diagnosis not present

## 2016-12-24 DIAGNOSIS — Z85830 Personal history of malignant neoplasm of bone: Secondary | ICD-10-CM | POA: Diagnosis not present

## 2016-12-24 DIAGNOSIS — L97211 Non-pressure chronic ulcer of right calf limited to breakdown of skin: Secondary | ICD-10-CM | POA: Diagnosis not present

## 2016-12-24 DIAGNOSIS — Z85528 Personal history of other malignant neoplasm of kidney: Secondary | ICD-10-CM | POA: Insufficient documentation

## 2016-12-24 DIAGNOSIS — I890 Lymphedema, not elsewhere classified: Secondary | ICD-10-CM | POA: Diagnosis not present

## 2016-12-24 DIAGNOSIS — R1310 Dysphagia, unspecified: Secondary | ICD-10-CM | POA: Diagnosis not present

## 2016-12-24 DIAGNOSIS — L0390 Cellulitis, unspecified: Secondary | ICD-10-CM | POA: Diagnosis not present

## 2016-12-24 DIAGNOSIS — I10 Essential (primary) hypertension: Secondary | ICD-10-CM | POA: Insufficient documentation

## 2016-12-24 DIAGNOSIS — J90 Pleural effusion, not elsewhere classified: Secondary | ICD-10-CM | POA: Insufficient documentation

## 2016-12-24 DIAGNOSIS — L97321 Non-pressure chronic ulcer of left ankle limited to breakdown of skin: Secondary | ICD-10-CM | POA: Diagnosis not present

## 2016-12-24 DIAGNOSIS — E440 Moderate protein-calorie malnutrition: Secondary | ICD-10-CM | POA: Insufficient documentation

## 2016-12-24 DIAGNOSIS — J910 Malignant pleural effusion: Secondary | ICD-10-CM | POA: Diagnosis not present

## 2016-12-24 DIAGNOSIS — Z85038 Personal history of other malignant neoplasm of large intestine: Secondary | ICD-10-CM | POA: Insufficient documentation

## 2016-12-24 DIAGNOSIS — C649 Malignant neoplasm of unspecified kidney, except renal pelvis: Secondary | ICD-10-CM | POA: Diagnosis not present

## 2016-12-24 DIAGNOSIS — C7800 Secondary malignant neoplasm of unspecified lung: Secondary | ICD-10-CM | POA: Diagnosis not present

## 2016-12-24 NOTE — Telephone Encounter (Signed)
Left detailed message.  Robin to call back with info.

## 2016-12-24 NOTE — Telephone Encounter (Signed)
Robin nurse with [facility] left v/m; pt is going 1 - 2 times a week for wound care at wound center; leg wrapped, cleansed and rewrapped.; with an order this is something hospice nurse can do. Shirlean Mylar also said when pt has CP pt goes to ED for thoracentesis and getting 1-3 L drawn from his chest. Hospice can do that in the home if pt has a catheter in place to do the drainage.robin said can be left as is or if ordered hospice can do. Robin request cb.

## 2016-12-24 NOTE — Telephone Encounter (Signed)
Spoke with donna (pt daughter) she wanted intermittent fmla to help take care of her father when needed.  She stated she would be taking 2 weeks but didn't know when this would start  In dr Damita Dunnings in box

## 2016-12-24 NOTE — Telephone Encounter (Signed)
Please give the order for wound care.  Does he have a catheter in place currently?  If not, then we'll have to consider options for placement.  Let me know.  Thanks.

## 2016-12-24 NOTE — Telephone Encounter (Signed)
I'll work on the hard copy.  Thanks.  

## 2016-12-25 ENCOUNTER — Encounter: Payer: Self-pay | Admitting: *Deleted

## 2016-12-25 ENCOUNTER — Other Ambulatory Visit: Payer: Self-pay | Admitting: Radiology

## 2016-12-25 ENCOUNTER — Observation Stay
Admission: EM | Admit: 2016-12-25 | Discharge: 2016-12-27 | Disposition: A | Discharge status: Hospice | Attending: Internal Medicine | Admitting: Internal Medicine

## 2016-12-25 ENCOUNTER — Emergency Department

## 2016-12-25 DIAGNOSIS — C7800 Secondary malignant neoplasm of unspecified lung: Secondary | ICD-10-CM | POA: Diagnosis not present

## 2016-12-25 DIAGNOSIS — R1310 Dysphagia, unspecified: Secondary | ICD-10-CM | POA: Diagnosis not present

## 2016-12-25 DIAGNOSIS — I9581 Postprocedural hypotension: Secondary | ICD-10-CM | POA: Diagnosis not present

## 2016-12-25 DIAGNOSIS — J939 Pneumothorax, unspecified: Secondary | ICD-10-CM | POA: Diagnosis not present

## 2016-12-25 DIAGNOSIS — R634 Abnormal weight loss: Secondary | ICD-10-CM | POA: Diagnosis not present

## 2016-12-25 DIAGNOSIS — I10 Essential (primary) hypertension: Secondary | ICD-10-CM | POA: Diagnosis not present

## 2016-12-25 DIAGNOSIS — R0602 Shortness of breath: Secondary | ICD-10-CM | POA: Diagnosis not present

## 2016-12-25 DIAGNOSIS — Z85038 Personal history of other malignant neoplasm of large intestine: Secondary | ICD-10-CM | POA: Diagnosis not present

## 2016-12-25 DIAGNOSIS — R0600 Dyspnea, unspecified: Secondary | ICD-10-CM

## 2016-12-25 DIAGNOSIS — Z85528 Personal history of other malignant neoplasm of kidney: Secondary | ICD-10-CM | POA: Insufficient documentation

## 2016-12-25 DIAGNOSIS — K219 Gastro-esophageal reflux disease without esophagitis: Secondary | ICD-10-CM | POA: Diagnosis not present

## 2016-12-25 DIAGNOSIS — N401 Enlarged prostate with lower urinary tract symptoms: Secondary | ICD-10-CM | POA: Diagnosis not present

## 2016-12-25 DIAGNOSIS — E785 Hyperlipidemia, unspecified: Secondary | ICD-10-CM | POA: Diagnosis not present

## 2016-12-25 DIAGNOSIS — J9381 Chronic pneumothorax: Secondary | ICD-10-CM | POA: Insufficient documentation

## 2016-12-25 DIAGNOSIS — Z681 Body mass index (BMI) 19 or less, adult: Secondary | ICD-10-CM | POA: Insufficient documentation

## 2016-12-25 DIAGNOSIS — Z87891 Personal history of nicotine dependence: Secondary | ICD-10-CM | POA: Insufficient documentation

## 2016-12-25 DIAGNOSIS — L8990 Pressure ulcer of unspecified site, unspecified stage: Secondary | ICD-10-CM | POA: Insufficient documentation

## 2016-12-25 DIAGNOSIS — J90 Pleural effusion, not elsewhere classified: Secondary | ICD-10-CM | POA: Diagnosis not present

## 2016-12-25 DIAGNOSIS — E039 Hypothyroidism, unspecified: Secondary | ICD-10-CM | POA: Insufficient documentation

## 2016-12-25 DIAGNOSIS — J9600 Acute respiratory failure, unspecified whether with hypoxia or hypercapnia: Secondary | ICD-10-CM | POA: Diagnosis not present

## 2016-12-25 DIAGNOSIS — C649 Malignant neoplasm of unspecified kidney, except renal pelvis: Secondary | ICD-10-CM | POA: Diagnosis not present

## 2016-12-25 DIAGNOSIS — C7802 Secondary malignant neoplasm of left lung: Secondary | ICD-10-CM | POA: Insufficient documentation

## 2016-12-25 DIAGNOSIS — J910 Malignant pleural effusion: Secondary | ICD-10-CM | POA: Diagnosis not present

## 2016-12-25 DIAGNOSIS — E46 Unspecified protein-calorie malnutrition: Secondary | ICD-10-CM | POA: Insufficient documentation

## 2016-12-25 LAB — BASIC METABOLIC PANEL
ANION GAP: 4 — AB (ref 5–15)
BUN: 32 mg/dL — ABNORMAL HIGH (ref 6–20)
CALCIUM: 8.7 mg/dL — AB (ref 8.9–10.3)
CO2: 27 mmol/L (ref 22–32)
Chloride: 101 mmol/L (ref 101–111)
Creatinine, Ser: 1.26 mg/dL — ABNORMAL HIGH (ref 0.61–1.24)
GFR, EST NON AFRICAN AMERICAN: 52 mL/min — AB (ref >60)
Glucose, Bld: 85 mg/dL (ref 65–99)
Potassium: 5.2 mmol/L — ABNORMAL HIGH (ref 3.5–5.1)
Sodium: 132 mmol/L — ABNORMAL LOW (ref 135–145)

## 2016-12-25 LAB — CBC WITH DIFFERENTIAL/PLATELET
BASOS ABS: 0 10*3/uL (ref 0–0.1)
BASOS PCT: 0 %
EOS PCT: 2 %
Eosinophils Absolute: 0.2 10*3/uL (ref 0–0.7)
HEMATOCRIT: 37.3 % — AB (ref 40.0–52.0)
Hemoglobin: 12.6 g/dL — ABNORMAL LOW (ref 13.0–18.0)
Lymphocytes Relative: 25 %
Lymphs Abs: 2.2 10*3/uL (ref 1.0–3.6)
MCH: 34.6 pg — ABNORMAL HIGH (ref 26.0–34.0)
MCHC: 33.8 g/dL (ref 32.0–36.0)
MCV: 102.2 fL — ABNORMAL HIGH (ref 80.0–100.0)
MONO ABS: 0.5 10*3/uL (ref 0.2–1.0)
Monocytes Relative: 6 %
NEUTROS ABS: 6 10*3/uL (ref 1.4–6.5)
Neutrophils Relative %: 67 %
PLATELETS: 188 10*3/uL (ref 150–440)
RBC: 3.65 MIL/uL — ABNORMAL LOW (ref 4.40–5.90)
RDW: 16.1 % — AB (ref 11.5–14.5)
WBC: 8.9 10*3/uL (ref 3.8–10.6)

## 2016-12-25 LAB — PROTIME-INR
INR: 1.05
Prothrombin Time: 13.7 seconds (ref 11.4–15.2)

## 2016-12-25 LAB — APTT: APTT: 31 s (ref 24–36)

## 2016-12-25 MED ORDER — ENOXAPARIN SODIUM 40 MG/0.4ML ~~LOC~~ SOLN
40.0000 mg | SUBCUTANEOUS | Status: DC
  Administered 2016-12-25 – 2016-12-26 (×2): 40 mg via SUBCUTANEOUS
  Filled 2016-12-25 (×2): qty 0.4

## 2016-12-25 MED ORDER — DOXAZOSIN MESYLATE 2 MG PO TABS
8.0000 mg | ORAL_TABLET | Freq: Every day | ORAL | Status: DC
  Administered 2016-12-26 – 2016-12-27 (×2): 8 mg via ORAL
  Filled 2016-12-25 (×2): qty 4

## 2016-12-25 MED ORDER — VERAPAMIL HCL ER 240 MG PO TBCR
240.0000 mg | EXTENDED_RELEASE_TABLET | Freq: Every day | ORAL | Status: DC
  Administered 2016-12-25 – 2016-12-26 (×2): 240 mg via ORAL
  Filled 2016-12-25 (×2): qty 1

## 2016-12-25 MED ORDER — SODIUM CHLORIDE 0.9% FLUSH
3.0000 mL | Freq: Two times a day (BID) | INTRAVENOUS | Status: DC
  Administered 2016-12-25 – 2016-12-26 (×3): 3 mL via INTRAVENOUS

## 2016-12-25 MED ORDER — ONDANSETRON HCL 4 MG PO TABS: 4.0000 mg | ORAL_TABLET | Freq: Four times a day (QID) | ORAL | Status: DC | PRN

## 2016-12-25 MED ORDER — BOOST PO LIQD
237.0000 mL | Freq: Every day | ORAL | Status: DC
  Administered 2016-12-26 – 2016-12-27 (×2): 237 mL via ORAL
  Filled 2016-12-25 (×3): qty 237

## 2016-12-25 MED ORDER — PROPRANOLOL HCL 20 MG PO TABS
20.0000 mg | ORAL_TABLET | Freq: Two times a day (BID) | ORAL | Status: DC
  Administered 2016-12-25 – 2016-12-26 (×2): 20 mg via ORAL
  Filled 2016-12-25 (×2): qty 1

## 2016-12-25 MED ORDER — CEFAZOLIN SODIUM-DEXTROSE 2-4 GM/100ML-% IV SOLN
2.0000 g | INTRAVENOUS | Status: DC
Start: 2016-12-28 — End: 2016-12-27

## 2016-12-25 MED ORDER — ACETAMINOPHEN 650 MG RE SUPP: 650.0000 mg | Freq: Four times a day (QID) | RECTAL | Status: DC | PRN

## 2016-12-25 MED ORDER — FINASTERIDE 5 MG PO TABS
5.0000 mg | ORAL_TABLET | Freq: Every day | ORAL | Status: DC
  Administered 2016-12-25 – 2016-12-27 (×3): 5 mg via ORAL
  Filled 2016-12-25 (×4): qty 1

## 2016-12-25 MED ORDER — OXYCODONE HCL 5 MG PO TABS: 5.0000 mg | ORAL_TABLET | ORAL | Status: DC | PRN

## 2016-12-25 MED ORDER — ACETAMINOPHEN 325 MG PO TABS: 650.0000 mg | ORAL_TABLET | Freq: Four times a day (QID) | ORAL | Status: DC | PRN

## 2016-12-25 MED ORDER — FUROSEMIDE 20 MG PO TABS: 20.0000 mg | ORAL_TABLET | Freq: Every day | ORAL | Status: DC | PRN

## 2016-12-25 MED ORDER — TRAMADOL HCL 50 MG PO TABS
50.0000 mg | ORAL_TABLET | Freq: Three times a day (TID) | ORAL | Status: DC | PRN
  Administered 2016-12-26 – 2016-12-27 (×2): 50 mg via ORAL
  Filled 2016-12-25 (×2): qty 1

## 2016-12-25 MED ORDER — LEVOTHYROXINE SODIUM 50 MCG PO TABS
50.0000 ug | ORAL_TABLET | Freq: Every day | ORAL | Status: DC
  Administered 2016-12-26 – 2016-12-27 (×2): 50 ug via ORAL
  Filled 2016-12-25 (×2): qty 1

## 2016-12-25 MED ORDER — SODIUM CHLORIDE 0.9 % IV SOLN: INTRAVENOUS | Status: DC

## 2016-12-25 MED ORDER — ONDANSETRON HCL 4 MG/2ML IJ SOLN: 4.0000 mg | Freq: Four times a day (QID) | INTRAMUSCULAR | Status: DC | PRN

## 2016-12-25 NOTE — ED Provider Notes (Signed)
[facility] Emergency Department Provider Note  ____________________________________________   First MD Initiated Contact with Patient 12/25/16 1712     (approximate)  I have reviewed the triage vital signs and the nursing notes.   HISTORY  Chief Complaint Shortness of Breath   HPI Hunter Hardin. is a 82 y.o. male Patient with metastatic cancer with kidney and lung masses was presented to the emergency department with worsening shortness of breath. He was told to come to the hospital by the hospice service for a thoracentesis. He has had multiple thoracenteses over the past month. He is scheduled to have a drain placed this Monday. He denies any pain.   Past Medical History:  Diagnosis Date  . Colon cancer ([place]) dx'd 01/2013  . Elevated PSA    H/O  . GERD (gastroesophageal reflux disease)   . HLD (hyperlipidemia)   . Hypertension   . Hypothyroidism   . Metastasis to lung ([place]) dx'd 01/2013  . Pleural effusion   . Protein calorie malnutrition ([place]) 06/13/2013  . renal ca dx'd 01/2013    Patient Active Problem List   Diagnosis Date Noted  . Malignant neoplasm metastatic to left lung ([place])   . Dyspnea 12/12/2016  . Skin ulceration ([place]) 12/02/2016  . Left groin hernia 06/17/2016  . Recurrent left pleural effusion 06/17/2016  . External hemorrhoid 04/17/2016  . Advance care planning 06/06/2015  . Edema 04/03/2014  . Thrombocytopenia ([place]) 02/23/2014  . Protein calorie malnutrition ([place]) 06/13/2013  . Renal cell cancer ([place]) 05/21/2013  . Colon cancer ([place]) 03/07/2013  . Preoperative evaluation to rule out surgical contraindication 03/03/2013  . Medicare annual wellness visit, initial 11/04/2012  . HYPERGLYCEMIA 05/01/2007  . Hypothyroidism 04/29/2007  . HLD (hyperlipidemia) 04/29/2007  . Essential hypertension 04/29/2007  . ELEVATED PROSTATE SPECIFIC ANTIGEN 04/29/2007    Past Surgical History:  Procedure Laterality Date  .  CHOLECYSTECTOMY  early 15's  . COLONOSCOPY  01/2013  . LAPAROSCOPIC PARTIAL COLECTOMY N/A 03/06/2013   Procedure: LAPAROSCOPic assisted right hemi COLECTOMY;  Surgeon: Leighton Ruff, MD;  Location: [facility] ORS;  Service: General;  Laterality: N/A;  . VASECTOMY      Prior to Admission medications   Medication Sig Start Date End Date Taking? Authorizing Provider  cabozantinib S-Malate (CABOMETYX) 20 MG TABS Take 1 tablet by mouth daily. 05/21/16   [name], MD  doxazosin (CARDURA) 8 MG tablet TAKE 1 TABLET DAILY 06/15/16   Tonia Ghent, MD  finasteride (PROSCAR) 5 MG tablet Take 5 mg by mouth daily. 01/09/16   Historical Provider, MD  fish oil-omega-3 fatty acids 1000 MG capsule Take 1 g by mouth 2 (two) times daily.    Historical Provider, MD  furosemide (LASIX) 20 MG tablet Take 1 tablet (20 mg total) by mouth daily as needed for fluid. 12/01/16   Tonia Ghent, MD  KLOR-CON M20 20 MEQ tablet TAKE 1 TABLET TWICE A DAY 08/17/16   Tonia Ghent, MD  lactose free nutrition (BOOST) LIQD Take 237 mLs by mouth daily.    Historical Provider, MD  levothyroxine (SYNTHROID, LEVOTHROID) 50 MCG tablet TAKE 2 TABLETS ON SUNDAY AND 1 TABLET ON ALL OTHER DAYS (8 TABLETS A WEEK TOTAL) 12/17/16   Tonia Ghent, MD  loperamide (IMODIUM A-D) 2 MG tablet Take 2 mg by mouth as needed for diarrhea or loose stools.    Historical Provider, MD  magnesium chloride (SLOW-MAG) 64 MG TBEC SR tablet Take 2 tablets (128 mg  total) by mouth daily. 12/13/16   Eber Jones, MD  ondansetron (ZOFRAN) 8 MG tablet Take 1 tablet (8 mg total) by mouth every 8 (eight) hours as needed. for nausea 02/26/16   Tonia Ghent, MD  propranolol (INDERAL) 40 MG tablet Take 0.5 tablets (20 mg total) by mouth 2 (two) times daily. 06/05/15   Tonia Ghent, MD  traMADol (ULTRAM) 50 MG tablet Take 1 tablet (50 mg total) by mouth every 8 (eight) hours as needed. Patient taking differently: Take 50 mg by mouth every 8 (eight) hours as  needed for moderate pain.  12/04/16   Tonia Ghent, MD  verapamil (CALAN-SR) 240 MG CR tablet TAKE 1 TABLET DAILY 06/15/16   Tonia Ghent, MD  vitamin C (ASCORBIC ACID) 500 MG tablet Take 500 mg by mouth daily.    Historical Provider, MD    Allergies Patient has no known allergies.  Family History  Problem Relation Age of Onset  . Hypertension Mother   . Hyperlipidemia Mother   . Dementia Mother   . Heart disease Father     ? MI, CAD  . Cancer Brother     oral/tonsil cancer 2012  . Cancer Sister     breast cancer  . Diabetes Cousin   . Colon cancer Neg Hx   . Prostate cancer Neg Hx     Social History Social History  Substance Use Topics  . Smoking status: Former Smoker    Packs/day: 2.00    Years: 30.00    Types: Cigarettes    Quit date: 12/21/1985  . Smokeless tobacco: Never Used  . Alcohol use 0.0 oz/week     Comment: beer occassionally    Review of Systems Constitutional: No fever/chills Eyes: No visual changes. ENT: No sore throat. Cardiovascular: Denies chest pain. Respiratory:as above Gastrointestinal: No abdominal pain.  No nausea, no vomiting.  No diarrhea.  No constipation. Genitourinary: Negative for dysuria. Musculoskeletal: Negative for back pain. Skin: Negative for rash. Neurological: Negative for headaches, focal weakness or numbness.  10-point ROS otherwise negative.  ____________________________________________   PHYSICAL EXAM:  VITAL SIGNS: ED Triage Vitals  Enc Vitals Group     BP 12/25/16 1541 (!) 99/58     Pulse Rate 12/25/16 1541 75     Resp 12/25/16 1541 18     Temp 12/25/16 1541 97.2 F (36.2 C)     Temp Source 12/25/16 1541 Oral     SpO2 12/25/16 1541 95 %     Weight 12/25/16 1542 125 lb (56.7 kg)     Height 12/25/16 1542 '5\' 11"'$  (1.803 m)     Head Circumference --      Peak Flow --      Pain Score 12/25/16 1542 0     Pain Loc --      Pain Edu? --      Excl. in [place]? --     Constitutional: Alert and oriented. Well  appearing and in no acute distress. Eyes: Conjunctivae are normal. PERRL. EOMI. Head: Atraumatic. Nose: No congestion/rhinnorhea. Mouth/Throat: Mucous membranes are moist.   Neck: No stridor.   Cardiovascular: Normal rate, regular rhythm. Grossly normal heart sounds.    Respiratory: Normal respiratory effort.  No retractions. Decreased lung sounds with rales to the left hemithorax. Gastrointestinal: Soft and nontender. No distention.  Musculoskeletal: No lower extremity tenderness nor edema.  No joint effusions. Neurologic:  Normal speech and language. No gross focal neurologic deficits are appreciated.  Skin:  Skin is  warm, dry and intact. No rash noted. Psychiatric: Mood and affect are normal. Speech and behavior are normal.  ____________________________________________   LABS (all labs ordered are listed, but only abnormal results are displayed)  Labs Reviewed  CBC WITH DIFFERENTIAL/PLATELET - Abnormal; Notable for the following:       Result Value   RBC 3.65 (*)    Hemoglobin 12.6 (*)    HCT 37.3 (*)    MCV 102.2 (*)    MCH 34.6 (*)    RDW 16.1 (*)    All other components within normal limits  BASIC METABOLIC PANEL - Abnormal; Notable for the following:    Sodium 132 (*)    Potassium 5.2 (*)    BUN 32 (*)    Creatinine, Ser 1.26 (*)    Calcium 8.7 (*)    GFR calc non Af Amer 52 (*)    Anion gap 4 (*)    All other components within normal limits  PROTIME-INR  APTT   ____________________________________________  EKG   ____________________________________________  RADIOLOGY   DG Chest 2 View (Final result)  Result time 12/25/16 16:46:42  Final result by Lahoma Crocker, MD (12/25/16 16:46:42)           Narrative:   CLINICAL DATA: Shortness of breath, history of pleural effusion, lung cancer  EXAM: CHEST 2 VIEW  COMPARISON: 12/13/2016  FINDINGS: Cardiac size is stable. There is large left pleural effusion increased in size from prior exam. Worsening  consolidation left mid and left lower lung. Small left upper hydropneumothorax again noted. The left apical pneumothorax measures 2.4 cm maximum thickness.  IMPRESSION: There is large left pleural effusion increased in size from prior exam. Again noted left upper hydropneumothorax. Left apical pneumothorax measures 2.4 cm maximum thickness. Worsening consolidation in left lower lung.   Electronically Signed By: Lahoma Crocker M.D. On: 12/25/2016 16:46             ____________________________________________   PROCEDURES  Procedure(s) performed:   Procedures  Critical Care performed:   ____________________________________________   INITIAL IMPRESSION / ASSESSMENT AND PLAN / ED COURSE  Pertinent labs & imaging results that were available during my care of the patient were reviewed by me and considered in my medical decision making (see chart for details).   Clinical Course    ----------------------------------------- 6:49 PM on 12/25/2016 -----------------------------------------  Discussed the case with Dr. Raul Del of pulmonology who says that the thoracentesis will likely be done tomorrow morning. Patient to be admitted to the hospital. Signed out to [place]. The patient as well as family are aware of the need for [place] willing to comply.  ____________________________________________   FINAL CLINICAL IMPRESSION(S) / ED DIAGNOSES  Shortness of breath. Pleural effusion with apical pneumothorax.    NEW MEDICATIONS STARTED DURING THIS VISIT:  New Prescriptions   No medications on file     Note:  This document was prepared using Dragon voice recognition software and may include unintentional dictation errors.    Orbie Pyo, MD 12/25/16 [phone]

## 2016-12-25 NOTE — Progress Notes (Signed)
CLEBERT, WENGER ([id number]) Visit Report for 12/24/2016 Chief Complaint Document Details Patient Name: Hunter Hardin, Hunter Hardin. Date of Service: 12/24/2016 2:00 PM Medical Record Number: [id number] Patient Account Number: [id number] Date of Birth/Sex: [birth year] (82 y.o. Male) Treating RN: Montey Hora Primary Care Physician: Elsie Stain Other Clinician: Referring Physician: Elsie Stain Treating Physician/Extender: Frann Rider in Treatment: 4 Information Obtained from: Patient Chief Complaint Patient presents to the wound care center for a evaluation of non healing wound to both lower extremities Electronic Signature(s) Signed: 12/24/2016 2:48:06 PM By: Christin Fudge MD, FACS Entered By: Christin Fudge on 12/24/2016 14:48:06 Hunter Hardin ([id number]) -------------------------------------------------------------------------------- HPI Details Patient Name: Hunter Hardin Date of Service: 12/24/2016 2:00 PM Medical Record Number: [id number] Patient Account Number: [id number] Date of Birth/Sex: 1935-08-02 (82 y.o. Male) Treating RN: Montey Hora Primary Care Physician: Elsie Stain Other Clinician: Referring Physician: Elsie Stain Treating Physician/Extender: Frann Rider in Treatment: 4 History of Present Illness Location: both lower extremities right worse than left Quality: Patient reports experiencing a dull pain to affected area(s). Severity: Patient states wound are getting worse. Duration: Patient has had the wound for < 3 weeks prior to presenting for treatment Timing: Pain in wound is Intermittent (comes and goes Context: The wound would happen gradually Modifying Factors: Other treatment(s) tried include:local ointments and local care Associated Signs and Symptoms: Patient reports having increase swelling. 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 Past medical history significant for essential hypertension, pleural effusion, colon cancer, hypothyroidism, renal cell cancer, hyperglycemia, thrombocytopenia, protein calorie malnutrition. the patient lives alone and has very little support and has a family friend who is helping him with his appointments. He has not seen a dermatologist for this condition recently. 12/03/2016 -- he continues to have a lot of pain and has not yet seen a dermatologist. 12-10-16 Mr Seubert returns today for evaluation of his bilateral lower strumming ulcerations. He has not made an appointment with dermatology as yet. He is accompanied by his cousin, she has accompanied him to all of his appointments and is inquiring about the need for dermatology in light of his overall state of health. He recently had a thoracentesis (12/02/2016) with removal of "2 canisters". Mr. Jeschke primary complaint is of pain to these ulcerations, he has been prescribed pain medication and takes that nightly. He is voicing no complaints regarding dressing changes or home health. 12/17/2016 -- right spoken to his PCP, Dr. Damita Dunnings regarding his pain management and sleep deprivation and Dr. Damita Dunnings was kind enough to take care of this. The patient has not yet seen dermatology. 12/24/2016 -- he did go to dermatology and though we do not have their notes his sister says that they put him on antibiotic which is not yet named and told him to continue with wound care as we have better equipped to treat him Electronic Signature(s) Signed: 12/24/2016 2:48:37 PM By: Christin Fudge MD, FACS Entered By: Christin Fudge on 12/24/2016 14:48:37 Hunter Hardin ([id number]) -------------------------------------------------------------------------------- Physical Exam Details Patient Name: Hunter Hardin Date of Service: 12/24/2016 2:00 PM Medical Record Number: [id number] Patient Account Number: [id number] Date of Birth/Sex: Jun 12, 1935 (82 y.o. Male) Treating RN: Montey Hora Primary Care Physician: Elsie Stain Other Clinician: Referring Physician: Elsie Stain Treating Physician/Extender: Frann Rider in  Treatment: 4 Constitutional . Pulse regular. Respirations normal and unlabored. Afebrile. . Eyes Nonicteric. Reactive to light. Ears, Nose, Mouth, and Throat Lips, teeth, and gums WNL.[name] Moist mucosa without lesions. Neck supple and nontender. No palpable supraclavicular or cervical adenopathy. Normal sized without goiter. Respiratory WNL. No retractions.. Breath sounds WNL, No rubs, rales, rhonchi, or wheeze.. Cardiovascular Heart rhythm and rate regular, no murmur or gallop.. Pedal Pulses WNL. No clubbing, cyanosis or edema. Chest Breasts symmetical and no nipple discharge.. Breast tissue WNL, no masses, lumps, or tenderness.. Lymphatic No adneopathy. No adenopathy. No adenopathy. Musculoskeletal Adexa without tenderness or enlargement.. Digits and nails w/o clubbing, cyanosis, infection, petechiae, ischemia, or inflammatory conditions.. Integumentary (Hair, Skin) No suspicious lesions. No crepitus or fluctuance. No peri-wound warmth or erythema. No masses.[name] Psychiatric Judgement and insight Intact.. No evidence of depression, anxiety, or agitation.. Notes the left lower extremity wound is completely healed and the right side still has superficial painful ulcers which have a slough which was washed out with moist saline gauze Electronic Signature(s) Signed: 12/24/2016 2:49:19 PM By: Christin Fudge MD, FACS Entered By: Christin Fudge on 12/24/2016 14:49:18 Hunter Hardin ([id number]) -------------------------------------------------------------------------------- Physician Orders Details Patient Name: Hunter Hardin. Date of Service: 12/24/2016 2:00  PM Medical Record Number: [id number] Patient Account Number: [id number] Date of Birth/Sex: April 14, 1935 (82 y.o. Male) Treating RN: Montey Hora Primary Care Physician: Elsie Stain Other Clinician: Referring Physician: Elsie Stain Treating Physician/Extender: Frann Rider in Treatment: 4 Verbal / Phone Orders: Yes Clinician: Montey Hora Read Back and Verified: Yes Diagnosis Coding Wound Cleansing Wound #1 Right,Circumferential Lower Leg o Clean wound with Normal Saline. o Cleanse wound with mild soap and water Anesthetic Wound #1 Right,Circumferential Lower Leg o Topical Lidocaine 4% cream applied to wound bed prior to debridement - clinic use Primary Wound Dressing Wound #1 Right,Circumferential Lower Leg o Aquacel Ag Secondary Dressing Wound #1 Right,Circumferential Lower Leg o ABD pad - if needed for drainage Dressing Change Frequency Wound #1 Right,Circumferential Lower Leg o Three times weekly - will be changed at [facility] on Thursdays Follow-up Appointments Wound #1 Right,Circumferential Lower Leg o Return Appointment in 1 week. Edema Control Wound #1 Right,Circumferential Lower Leg o Kerlix and Coban - Bilateral - lightly Additional Orders / Instructions Wound #1 Right,Circumferential Lower Leg o Increase protein intake. [place] ([id number]) Wound #1 Right,Circumferential Lower Leg o [place] Visits - Patient is now covered under [place] for wound care please o Home Health Nurse may visit PRN to address patientos wound care needs. o FACE TO FACE ENCOUNTER: MEDICARE and MEDICAID PATIENTS: I certify that this patient is under my care and that I had a face-to-face encounter that meets the physician face-to-face encounter requirements with this patient on this date. The encounter with the patient was in whole or in part for the following MEDICAL CONDITION: (primary reason for  [place]) MEDICAL NECESSITY: I certify, that based on my findings, NURSING services are a medically necessary home health service. HOME BOUND STATUS: I certify that my clinical findings support that this patient is homebound (i.e., Due to illness or injury, pt requires aid of supportive devices such as crutches, cane, wheelchairs, walkers, the use of special transportation or the assistance of another person to leave their place of residence. There is a normal inability to leave the home and doing so requires considerable and taxing effort. Other absences are for medical reasons / religious services and are infrequent or of short duration  when for other reasons). o If current dressing causes regression in wound condition, may D/C ordered dressing product/s and apply Normal Saline Moist Dressing daily until next [place] / Other MD appointment. [place] of regression in wound condition at [phone]. o Please direct any NON-WOUND related issues/requests for orders to patient's Primary Care Physician o D/C [place] - Please discharge from [facility] as patient now has Hospice services Electronic Signature(s) Signed: 12/24/2016 3:27:10 PM By: Christin Fudge MD, FACS Entered By: Christin Fudge on 12/24/2016 14:49:36 Hunter Hardin ([id number]) -------------------------------------------------------------------------------- Problem List Details Patient Name: Hunter Hardin Date of Service: 12/24/2016 2:00 PM Medical Record Number: [id number] Patient Account Number: [id number] Date of Birth/Sex: [birth year] (82 y.o. 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 11/26/2016 Yes breakdown of skin I89.0 Lymphedema, not elsewhere classified 11/26/2016 Yes E44.0 Moderate protein-calorie malnutrition 11/26/2016 Yes L13.9 Bullous disorder, unspecified 11/26/2016 Yes Z85.528 Personal history of other malignant neoplasm of kidney 11/26/2016 Yes Inactive Problems Resolved Problems Electronic Signature(s) Signed: 12/24/2016 2:47:50 PM By: Christin Fudge MD, FACS Entered By: Christin Fudge on 12/24/2016 14:47:50 Hunter Hardin ([id number]) -------------------------------------------------------------------------------- Progress Note Details Patient Name: Hunter Hardin Date of Service: 12/24/2016 2:00 PM Medical Record Number: [id number] Patient Account Number: [id number] Date of Birth/Sex: 1935/03/15 (82 y.o. Male) Treating RN: Montey Hora Primary Care Physician: Elsie Stain Other Clinician: Referring Physician: Elsie Stain Treating Physician/Extender: Frann Rider in Treatment: 4 Subjective Chief Complaint Information obtained from Patient Patient presents to the wound care center for a evaluation of non healing wound to both lower extremities History of Present Illness (HPI) The following HPI elements were documented for the patient's wound: Location: both lower extremities right worse than left Quality: Patient reports experiencing a dull pain to affected area(s). Severity: Patient states wound are getting worse. Duration: Patient has had the wound for < 3 weeks prior to presenting for treatment Timing: Pain in wound is Intermittent (comes and goes Context: The wound would happen gradually Modifying Factors: Other treatment(s) tried include:local ointments and local care Associated Signs and Symptoms: Patient reports having increase swelling. 82 year old patient with a history of colon cancer status post right hemicolectomy  and stage IV renal cell carcinoma with metastasis to the lung and bone also has hypertension, chronic bilateral pleural effusion and chronic swelling in his legs which are now getting worse. Past medical history significant for essential hypertension, pleural effusion, colon cancer, hypothyroidism, renal cell cancer, hyperglycemia, thrombocytopenia, protein calorie malnutrition. the patient lives alone and has very little support and has a family friend who is helping him with his appointments. He has not seen a dermatologist for this condition recently. 12/03/2016 -- he continues to have a lot of pain and has not yet seen a dermatologist. 12-10-16 Mr Sorg returns today for evaluation of his bilateral lower strumming ulcerations. He has not made an appointment with dermatology as yet. He is accompanied by his cousin, she has accompanied him to all of his appointments and is inquiring about the need for dermatology in light of his overall state of health. He recently had a thoracentesis (12/02/2016) with removal of "2 canisters". Mr. Fuhs primary complaint is of pain to these ulcerations,  he has been prescribed pain medication and takes that nightly. He is voicing no complaints regarding dressing changes or home health. 12/17/2016 -- right spoken to his PCP, Dr. Damita Dunnings regarding his pain management and sleep deprivation and Dr. Damita Dunnings was kind enough to take care of this. The patient has not yet seen dermatology. 12/24/2016 -- he did go to dermatology and though we do not have their notes his sister says that they put him on antibiotic which is not yet named and told him to continue with wound care as we have better equipped to treat him Hunter Hardin, Hunter Hardin ([id number]) Objective Constitutional Pulse regular. Respirations normal and unlabored. Afebrile. Vitals Time Taken: 2:07 PM, Height: 67 in, Weight: 138 lbs, BMI: 21.6, Temperature: 97.7 F, Pulse: 68 bpm, Respiratory Rate: 18  breaths/min, Blood Pressure: 112/59 mmHg. Eyes Nonicteric. Reactive to light. Ears, Nose, Mouth, and Throat Lips, teeth, and gums WNL.[name] Moist mucosa without lesions. Neck supple and nontender. No palpable supraclavicular or cervical adenopathy. Normal sized without goiter. Respiratory WNL. No retractions.. Breath sounds WNL, No rubs, rales, rhonchi, or wheeze.. Cardiovascular Heart rhythm and rate regular, no murmur or gallop.. Pedal Pulses WNL. No clubbing, cyanosis or edema. Chest Breasts symmetical and no nipple discharge.. Breast tissue WNL, no masses, lumps, or tenderness.. Lymphatic No adneopathy. No adenopathy. No adenopathy. Musculoskeletal Adexa without tenderness or enlargement.. Digits and nails w/o clubbing, cyanosis, infection, petechiae, ischemia, or inflammatory conditions.[name] Psychiatric Judgement and insight Intact.. No evidence of depression, anxiety, or agitation.. General Notes: the left lower extremity wound is completely healed and the right side still has superficial painful ulcers which have a slough which was washed out with moist saline gauze Integumentary (Hair, Skin) No suspicious lesions. No crepitus or fluctuance. No peri-wound warmth or erythema. No masses.Hunter Hardin, Hunter Hardin ([id number]) Wound #1 status is Open. Original cause of wound was Gradually Appeared. The wound is located on the Right,Circumferential Lower Leg. The wound measures 12.3cm length x 9.4cm width x 0.1cm depth; 90.808cm^2 area and 9.081cm^3 volume. The wound is limited to skin breakdown. There is no tunneling or undermining noted. There is a large amount of serous drainage noted. The wound margin is flat and intact. There is medium (34-66%) red granulation within the wound bed. There is a medium (34-66%) amount of necrotic tissue within the wound bed including Adherent Slough. The periwound skin appearance exhibited: Maceration, Moist, Erythema. The periwound skin appearance did not  exhibit: Callus, Crepitus, Excoriation, Fluctuance, Friable, Induration, Localized Edema, Rash, Scarring, Dry/Scaly, Atrophie Blanche, Cyanosis, Ecchymosis, Hemosiderin Staining, Mottled, Pallor, Rubor. The surrounding wound skin color is noted with erythema which is circumferential. Periwound temperature was noted as No Abnormality. The periwound has tenderness on palpation. Wound #2 status is Healed - Epithelialized. Original cause of wound was Gradually Appeared. The wound is located on the Left,Medial Malleolus. The wound measures 0cm length x 0cm width x 0cm depth; 0cm^2 area and 0cm^3 volume. Wound #3 status is Healed - Epithelialized. Original cause of wound was Gradually Appeared. The wound is located on the Left,Lateral Malleolus. The wound measures 0cm length x 0cm width x 0cm depth; 0cm^2 area and 0cm^3 volume. The wound is limited to skin breakdown. There is no tunneling or undermining noted. There is a small amount of serous drainage noted. The wound margin is flat and intact. There is no granulation within the wound bed. There is a large (67-100%) amount of necrotic tissue within the wound bed including Eschar. The periwound skin appearance exhibited: Moist, Erythema. The  periwound skin appearance did not exhibit: Callus, Crepitus, Excoriation, Fluctuance, Friable, Induration, Localized Edema, Rash, Scarring, Dry/Scaly, Maceration, Atrophie Blanche, Cyanosis, Ecchymosis, Hemosiderin Staining, Mottled, Pallor, Rubor. The surrounding wound skin color is noted with erythema which is circumferential. Periwound temperature was noted as No Abnormality. The periwound has tenderness on palpation. 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 Cleanse wound with mild soap and water Anesthetic: Wound #1 Right,Circumferential Lower Leg: Topical Lidocaine 4% cream applied to wound bed prior to debridement - clinic use Primary Wound Dressing: Wound #1 Right,Circumferential Lower Leg: Aquacel Ag Secondary Dressing: Wound #1 Right,Circumferential Lower Leg: ABD pad - if needed for drainage Dressing Change Frequency: Wound #1 Right,Circumferential Lower Leg: Three times weekly - will be changed at [facility] on Thursdays Follow-up Appointments: Wound #1 Right,Circumferential Lower Leg: Return Appointment in 1 week. Edema Control: Wound #1 Right,Circumferential Lower Leg: Kerlix and Coban - Bilateral - lightly Additional Orders / Instructions: Wound #1 Right,Circumferential Lower Leg: Increase protein intake. Home Health: Wound #1 Right,Circumferential Lower Leg: Continue Home Health Visits - Patient is now covered under [place] for wound care please Home Health Nurse may visit PRN to address patient s wound care needs. FACE TO FACE ENCOUNTER: MEDICARE and MEDICAID PATIENTS: I certify that this patient is under my care and that I had a face-to-face encounter that meets the physician face-to-face encounter requirements with this patient on this date. The encounter with the patient was in whole or in part for the following MEDICAL CONDITION: (primary reason for [place]) MEDICAL NECESSITY: I certify, that based on my findings, NURSING services are a medically necessary home health service. HOME BOUND STATUS: I certify that my clinical findings support that this patient is homebound (i.e., Due to illness or injury, pt requires aid of supportive devices such as crutches, cane,  wheelchairs, walkers, the use of special transportation or the assistance of another person to leave their place of residence. There is a normal inability to leave the home and doing so requires considerable and taxing effort. Other absences are for medical reasons / religious services and are infrequent or of short duration when for other reasons). If current dressing causes regression in wound condition, may D/C ordered dressing product/s and apply Normal Saline Moist Dressing daily until next [place] / Other MD appointment. [place] of regression in wound condition at [phone]. Please direct any NON-WOUND related issues/requests for orders to patient's Primary Care Physician D/C [place] - [facility] discharge from [facility] as patient now has [place]. ([id number]) At this stage his management is palliative, he is seeing hospice care and hence I have recommended: 1. Silver alginate, to be lightly wrapped with Kerlix and Coban and change 3 times a week if there is significant drainage, to his right lower extremity 2. Elevation and exercise have been discussed with him 3. dermatology opinion done today but I am awaiting its report when available. 4. Increase his protein intake, vitamin A, vitamin C and zinc 5. Weekly visits to the wound center for evaluation He and his  caregiver do understand that our treatment is going to be palliative to help him alleviate some of his symptoms Electronic Signature(s) Signed: 12/24/2016 2:51:04 PM By: Christin Fudge MD, FACS Entered By: Christin Fudge on 12/24/2016 14:51:04 Hunter Hardin ([id number]) -------------------------------------------------------------------------------- SuperBill Details Patient Name: Hunter Hardin Date of Service: 12/24/2016 Medical Record Number: [id number] Patient Account Number: [id number] Date of Birth/Sex: 09/15/1935 (82 y.o.  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 of skin I89.0 Lymphedema, not elsewhere classified E44.0 Moderate protein-calorie malnutrition L13.9 Bullous disorder, unspecified Z85.528 Personal history of other malignant neoplasm of kidney Facility Procedures CPT4 Code: [id number] Description: 99214 - WOUND CARE VISIT-LEV 4 EST PT Modifier: Quantity: 1 Physician Procedures CPT4 Code Description: [id number] 74715 - WC PHYS LEVEL 3 - EST PT ICD-10 Description Diagnosis L97.211 Non-pressure chronic ulcer of right calf limited to I89.0 Lymphedema, not elsewhere classified E44.0 Moderate protein-calorie malnutrition L13.9 Bullous  disorder, unspecified Modifier: breakdown of Quantity: 1 skin Electronic Signature(s) Signed: 12/24/2016 2:51:27 PM By: Christin Fudge MD, FACS Entered By: Christin Fudge on 12/24/2016 14:51:27

## 2016-12-25 NOTE — Telephone Encounter (Signed)
Shirlean Mylar, nurse with [facility] advised.

## 2016-12-25 NOTE — Telephone Encounter (Signed)
Done, please scan and send.  Thanks.

## 2016-12-25 NOTE — H&P (Signed)
[facility] [place] NAME: Hunter Hardin    MR#:  [id number]  DATE OF BIRTH:  01-16-1935  DATE OF ADMISSION:  12/25/2016  PRIMARY CARE PHYSICIAN: Elsie Stain, MD   REQUESTING/REFERRING PHYSICIAN: Clearnce Hasten, MD  CHIEF COMPLAINT:   Chief Complaint  Patient presents with  . Shortness of Breath    HISTORY OF PRESENT ILLNESS:  Hunter Hardin  is a 82 y.o. male who presents with Increasing shortness of breath due to recurrent left pleural effusion. Patient has had several thoracentesis for this, the effusion recurs due to his left lung cancer. Patient has plans for Pleurx catheter placement in 3 days. Given his reaccumulation of fluid with symptoms of this time, hospitalists were called for admission for thoracentesis.  PAST MEDICAL HISTORY:   Past Medical History:  Diagnosis Date  . Colon cancer ([place]) dx'd 01/2013  . Elevated PSA    H/O  . GERD (gastroesophageal reflux disease)   . HLD (hyperlipidemia)   . Hypertension   . Hypothyroidism   . Metastasis to lung ([place]) dx'd 01/2013  . Pleural effusion   . Protein calorie malnutrition ([place]) 06/13/2013  . renal ca dx'd 01/2013    PAST SURGICAL HISTORY:   Past Surgical History:  Procedure Laterality Date  . CHOLECYSTECTOMY  early 64's  . COLONOSCOPY  01/2013  . LAPAROSCOPIC PARTIAL COLECTOMY N/A 03/06/2013   Procedure: LAPAROSCOPic assisted right hemi COLECTOMY;  Surgeon: Leighton Ruff, MD;  Location: [facility];  Service: General;  Laterality: N/A;  . VASECTOMY      SOCIAL HISTORY:   Social History  Substance Use Topics  . Smoking status: Former Smoker    Packs/day: 2.00    Years: 30.00    Types: Cigarettes    Quit date: 12/21/1985  . Smokeless tobacco: Never Used  . Alcohol use 0.0 oz/week     Comment: beer occassionally    FAMILY HISTORY:   Family History  Problem Relation Age of Onset  . Hypertension Mother   . Hyperlipidemia Mother   . Dementia Mother   . Heart  disease Father     ? MI, CAD  . Cancer Brother     oral/tonsil cancer 2012  . Cancer Sister     breast cancer  . Diabetes Cousin   . Colon cancer Neg Hx   . Prostate cancer Neg Hx     DRUG ALLERGIES:  No Known Allergies  MEDICATIONS AT HOME:   Prior to Admission medications   Medication Sig Start Date End Date Taking? Authorizing Provider  cabozantinib S-Malate (CABOMETYX) 20 MG TABS Take 1 tablet by mouth daily. 05/21/16   [name], MD  doxazosin (CARDURA) 8 MG tablet TAKE 1 TABLET DAILY 06/15/16   Tonia Ghent, MD  finasteride (PROSCAR) 5 MG tablet Take 5 mg by mouth daily. 01/09/16   Historical Provider, MD  fish oil-omega-3 fatty acids 1000 MG capsule Take 1 g by mouth 2 (two) times daily.    Historical Provider, MD  furosemide (LASIX) 20 MG tablet Take 1 tablet (20 mg total) by mouth daily as needed for fluid. 12/01/16   Tonia Ghent, MD  KLOR-CON M20 20 MEQ tablet TAKE 1 TABLET TWICE A DAY 08/17/16   Tonia Ghent, MD  lactose free nutrition (BOOST) LIQD Take 237 mLs by mouth daily.    Historical Provider, MD  levothyroxine (SYNTHROID, LEVOTHROID) 50 MCG tablet TAKE 2 TABLETS ON SUNDAY AND 1 TABLET ON ALL OTHER DAYS (  8 TABLETS A WEEK TOTAL) 12/17/16   Tonia Ghent, MD  loperamide (IMODIUM A-D) 2 MG tablet Take 2 mg by mouth as needed for diarrhea or loose stools.    Historical Provider, MD  magnesium chloride (SLOW-MAG) 64 MG TBEC SR tablet Take 2 tablets (128 mg total) by mouth daily. 12/13/16   Eber Jones, MD  ondansetron (ZOFRAN) 8 MG tablet Take 1 tablet (8 mg total) by mouth every 8 (eight) hours as needed. for nausea 02/26/16   Tonia Ghent, MD  propranolol (INDERAL) 40 MG tablet Take 0.5 tablets (20 mg total) by mouth 2 (two) times daily. 06/05/15   Tonia Ghent, MD  traMADol (ULTRAM) 50 MG tablet Take 1 tablet (50 mg total) by mouth every 8 (eight) hours as needed. Patient taking differently: Take 50 mg by mouth every 8 (eight) hours as needed for  moderate pain.  12/04/16   Tonia Ghent, MD  verapamil (CALAN-SR) 240 MG CR tablet TAKE 1 TABLET DAILY 06/15/16   Tonia Ghent, MD  vitamin C (ASCORBIC ACID) 500 MG tablet Take 500 mg by mouth daily.    Historical Provider, MD    REVIEW OF SYSTEMS:  Review of Systems  Constitutional: Negative for chills, fever, malaise/fatigue and weight loss.  HENT: Negative for ear pain, hearing loss and tinnitus.   Eyes: Negative for blurred vision, double vision, pain and redness.  Respiratory: Positive for cough and shortness of breath. Negative for hemoptysis.   Cardiovascular: Negative for chest pain, palpitations, orthopnea and leg swelling.  Gastrointestinal: Negative for abdominal pain, constipation, diarrhea, nausea and vomiting.  Genitourinary: Negative for dysuria, frequency and hematuria.  Musculoskeletal: Negative for back pain, joint pain and neck pain.  Skin:       No acne, rash, or lesions  Neurological: Negative for dizziness, tremors, focal weakness and weakness.  Endo/Heme/Allergies: Negative for polydipsia. Does not bruise/bleed easily.  Psychiatric/Behavioral: Negative for depression. The patient is not nervous/anxious and does not have insomnia.      VITAL SIGNS:   Vitals:   12/25/16 1541 12/25/16 1542 12/25/16 1721 12/25/16 1730  BP: (!) 99/58  115/66 119/69  Pulse: 75  68 68  Resp: '18  20 16  '$ Temp: 97.2 F (36.2 C)     TempSrc: Oral     SpO2: 95%  97% 96%  Weight:  56.7 kg (125 lb)    Height:  '5\' 11"'$  (1.803 m)     Wt Readings from Last 3 Encounters:  12/25/16 56.7 kg (125 lb)  12/17/16 56.9 kg (125 lb 8 oz)  12/12/16 59.9 kg (132 lb)    PHYSICAL EXAMINATION:  Physical Exam  Vitals reviewed. Constitutional: He is oriented to person, place, and time. He appears well-developed and well-nourished. No distress.  HENT:  Head: Normocephalic and atraumatic.  Mouth/Throat: Oropharynx is clear and moist.  Eyes: Conjunctivae and EOM are normal. Pupils are equal,  round, and reactive to light. No scleral icterus.  Neck: Normal range of motion. Neck supple. No JVD present. No thyromegaly present.  Cardiovascular: Normal rate, regular rhythm and intact distal pulses.  Exam reveals no gallop and no friction rub.   No murmur heard. Respiratory: Effort normal. No respiratory distress. He has no wheezes. He has no rales.  Left coarse crackles  GI: Soft. Bowel sounds are normal. He exhibits no distension. There is no tenderness.  Musculoskeletal: Normal range of motion. He exhibits no edema.  No arthritis, no gout  Lymphadenopathy:  He has no cervical adenopathy.  Neurological: He is alert and oriented to person, place, and time. No cranial nerve deficit.  No dysarthria, no aphasia  Skin: Skin is warm and dry. No rash noted. No erythema.  Psychiatric: He has a normal mood and affect. His behavior is normal. Judgment and thought content normal.    LABORATORY PANEL:   CBC  Recent Labs Lab 12/25/16 1716  WBC 8.9  HGB 12.6*  HCT 37.3*  PLT 188   ------------------------------------------------------------------------------------------------------------------  Chemistries   Recent Labs Lab 12/23/16 0817 12/25/16 1716  NA 135* 132*  K 4.1 5.2*  CL  --  101  CO2 24 27  GLUCOSE 125 85  BUN 27.4* 32*  CREATININE 1.2 1.26*  CALCIUM 8.5 8.7*  AST 16  --   ALT 16  --   ALKPHOS 75  --   BILITOT 0.61  --    ------------------------------------------------------------------------------------------------------------------  Cardiac Enzymes No results for input(s): TROPONINI in the last 168 hours. ------------------------------------------------------------------------------------------------------------------  RADIOLOGY:  Dg Chest 2 View  Result Date: 12/25/2016 CLINICAL DATA:  Shortness of breath, history of pleural effusion, lung cancer EXAM: CHEST  2 VIEW COMPARISON:  12/13/2016 FINDINGS: Cardiac size is stable. There is large left  pleural effusion increased in size from prior exam. Worsening consolidation left mid and left lower lung. Small left upper hydropneumothorax again noted. The left apical pneumothorax measures 2.4 cm maximum thickness. IMPRESSION: There is large left pleural effusion increased in size from prior exam. Again noted left upper hydropneumothorax. Left apical pneumothorax measures 2.4 cm maximum thickness. Worsening consolidation in left lower lung. Electronically Signed   By: Lahoma Crocker M.D.   On: 12/25/2016 16:46    EKG:   Orders placed or performed during the hospital encounter of 12/12/16  . EKG 12-Lead  . EKG 12-Lead  . ED EKG  . ED EKG  . EKG 12-Lead  . EKG 12-Lead  . EKG    IMPRESSION AND PLAN:  Principal Problem:   Recurrent left pleural effusion - plan for thoracentesis tomorrow. Patient is artery scheduled for flex catheter placement on Monday. Active Problems:   Essential hypertension - stable, continue home meds   Malignant neoplasm metastatic to left lung [facility]) - patient is currently on hospice, not receiving any further chemotherapy   Hypothyroidism - home dose thyroid replacement  All the records are reviewed and case discussed with ED provider. Management plans discussed with the patient and/or family.  DVT PROPHYLAXIS: SubQ lovenox  GI PROPHYLAXIS: PPI  ADMISSION STATUS: Observation  CODE STATUS: Full Code Status History    Date Active Date Inactive Code Status Order ID Comments User Context   12/13/2016 12:35 AM 12/13/2016 10:49 PM Full Code [id number]  Toy Baker, MD Inpatient   10/30/2016  9:33 PM 10/31/2016  6:24 PM Full Code [id number]  Lily Kocher, MD Inpatient    Advance Directive Documentation   Flowsheet Row Most Recent Value  Type of Advance Directive  Healthcare Power of Attorney  Pre-existing out of facility DNR order (yellow form or pink MOST form)  No data  "MOST" Form in Place?  No data      TOTAL TIME TAKING CARE OF THIS PATIENT: 40  minutes.    Sivan Quast [place] 12/25/2016, 6:51 PM  [facility]  Office  [phone]  CC: Primary care physician; Elsie Stain, MD

## 2016-12-25 NOTE — ED Notes (Signed)
Transporting patient to room 104-1C

## 2016-12-25 NOTE — Telephone Encounter (Signed)
Hunter Hardin is at pts home;pt could not breathe and family is taking pt to ED now but family said if Dr Damita Dunnings would order an implanted catheter that it could be done at [place] [facility] on 12/28/16 at 7:30 AM or 9:30 AM or 12;30 AM. Hunter Hardin request cb.

## 2016-12-25 NOTE — ED Notes (Signed)
Pt denies needs. Call bell placed at right side. Family at bedside. Pt and family updated on admission bed status.

## 2016-12-25 NOTE — Progress Notes (Signed)
Patient refused to perform assessment of RLE wound stating that the dressing had just been done on 12/24/16. Other skin assessment done.

## 2016-12-25 NOTE — Telephone Encounter (Signed)
Please call hospice.  I am okay with getting this done- I think it would be a good idea.  I didn't put in an order, since Dr. Alen Blew already ordered it.  Let me know if they need anything specific from me.  Thanks.  Appreciate the update and the help of all involved.

## 2016-12-25 NOTE — Progress Notes (Signed)
LASZLO, ELLERBY ([id number]) Visit Report for 12/24/2016 Arrival Information Details Patient Name: Hunter Hardin, Hunter Hardin. Date of Service: 12/24/2016 2:00 PM Medical Record Number: [id number] Patient Account Number: [id number] Date of Birth/Sex: 1935-04-27 (82 y.o. Male) Treating RN: Montey Hora Primary Care Physician: Elsie Stain Other Clinician: Referring Physician: Elsie Stain Treating Physician/Extender: Frann Rider in Treatment: 4 Visit Information History Since Last Visit Added or deleted any medications: No Patient Arrived: Wheel Chair Any new allergies or adverse reactions: No Arrival Time: 14:05 Had a fall or experienced change in No activities of daily living that may affect Accompanied By: cousin risk of falls: Transfer Assistance: Manual Signs or symptoms of abuse/neglect since last No Patient Identification Verified: Yes visito Secondary Verification Process Yes Hospitalized since last visit: No Completed: Pain Present Now: Yes Patient Requires Transmission-Based No Precautions: Patient Has Alerts: Yes Electronic Signature(s) Signed: 12/24/2016 3:52:15 PM By: Montey Hora Entered By: Montey Hora on 12/24/2016 14:07:31 Hunter Hardin ([id number]) -------------------------------------------------------------------------------- Clinic Level of Care Assessment Details Patient Name: Hunter Hardin Date of Service: 12/24/2016 2:00 PM Medical Record Number: [id number] Patient Account Number: [id number] Date of Birth/Sex: September 10, 1935 (82 y.o. Male) Treating RN: Montey Hora Primary Care Physician: Elsie Stain Other Clinician: Referring Physician: Elsie Stain Treating Physician/Extender: Frann Rider in Treatment: 4 Clinic Level of Care Assessment Items TOOL 4 Quantity Score '[]'$  - Use when only an EandM is performed on FOLLOW-UP visit 0 ASSESSMENTS - Nursing Assessment / Reassessment X - Reassessment of Co-morbidities (includes  updates in patient status) 1 10 X - Reassessment of Adherence to Treatment Plan 1 5 ASSESSMENTS - Wound and Skin Assessment / Reassessment '[]'$  - Simple Wound Assessment / Reassessment - one wound 0 X - Complex Wound Assessment / Reassessment - multiple wounds 3 5 '[]'$  - Dermatologic / Skin Assessment (not related to wound area) 0 ASSESSMENTS - Focused Assessment '[]'$  - Circumferential Edema Measurements - multi extremities 0 '[]'$  - Nutritional Assessment / Counseling / Intervention 0 X - Lower Extremity Assessment (monofilament, tuning fork, pulses) 1 5 '[]'$  - Peripheral Arterial Disease Assessment (using hand held doppler) 0 ASSESSMENTS - Ostomy and/or Continence Assessment and Care '[]'$  - Incontinence Assessment and Management 0 '[]'$  - Ostomy Care Assessment and Management (repouching, etc.) 0 PROCESS - Coordination of Care X - Simple Patient / Family Education for ongoing care 1 15 '[]'$  - Complex (extensive) Patient / Family Education for ongoing care 0 '[]'$  - Staff obtains Programmer, systems, Records, Test Results / Process Orders 0 '[]'$  - Staff telephones HHA, Nursing Homes / Clarify orders / etc 0 '[]'$  - Routine Transfer to another Facility (non-emergent condition) 0 Hunter Hardin, Hunter Hardin ([id number]) '[]'$  - Routine Hospital Admission (non-emergent condition) 0 '[]'$  - New Admissions / Biomedical engineer / Ordering NPWT, Apligraf, etc. 0 '[]'$  - Emergency Hospital Admission (emergent condition) 0 X - Simple Discharge Coordination 1 10 '[]'$  - Complex (extensive) Discharge Coordination 0 PROCESS - Special Needs '[]'$  - Pediatric / Minor Patient Management 0 '[]'$  - Isolation Patient Management 0 '[]'$  - Hearing / Language / Visual special needs 0 '[]'$  - Assessment of Community assistance (transportation, D/C planning, etc.) 0 '[]'$  - Additional assistance / Altered mentation 0 '[]'$  - Support Surface(s) Assessment (bed, cushion, seat, etc.) 0 INTERVENTIONS - Wound Cleansing / Measurement '[]'$  - Simple Wound Cleansing - one wound 0 X -  Complex Wound Cleansing - multiple wounds 3 5 X - Wound Imaging (photographs - any number of wounds) 1 5 '[]'$  - Wound Tracing (instead of photographs) 0 '[]'$  -  Simple Wound Measurement - one wound 0 X - Complex Wound Measurement - multiple wounds 3 5 INTERVENTIONS - Wound Dressings '[]'$  - Small Wound Dressing one or multiple wounds 0 '[]'$  - Medium Wound Dressing one or multiple wounds 0 X - Large Wound Dressing one or multiple wounds 1 20 '[]'$  - Application of Medications - topical 0 '[]'$  - Application of Medications - injection 0 INTERVENTIONS - Miscellaneous '[]'$  - External ear exam 0 Hunter Hardin, Hunter Hardin ([id number]) '[]'$  - Specimen Collection (cultures, biopsies, blood, body fluids, etc.) 0 '[]'$  - Specimen(s) / Culture(s) sent or taken to Lab for analysis 0 '[]'$  - Patient Transfer (multiple staff / Harrel Lemon Lift / Similar devices) 0 '[]'$  - Simple Staple / Suture removal (25 or less) 0 '[]'$  - Complex Staple / Suture removal (26 or more) 0 '[]'$  - Hypo / Hyperglycemic Management (close monitor of Blood Glucose) 0 '[]'$  - Ankle / Brachial Index (ABI) - do not check if billed separately 0 X - Vital Signs 1 5 Has the patient been seen at the hospital within the last three years: Yes Total Score: 120 Level Of Care: New/Established - Level 4 Electronic Signature(s) Signed: 12/24/2016 3:52:15 PM By: Montey Hora Entered By: Montey Hora on 12/24/2016 14:44:38 Hunter Hardin ([id number]) -------------------------------------------------------------------------------- Encounter Discharge Information Details Patient Name: Hunter Hardin. Date of Service: 12/24/2016 2:00 PM Medical Record Number: [id number] Patient Account Number: [id number] Date of Birth/Sex: 14-May-1935 (82 y.o. Male) Treating RN: Montey Hora Primary Care Physician: Elsie Stain Other Clinician: Referring Physician: Elsie Stain Treating Physician/Extender: Frann Rider in Treatment: 4 Encounter Discharge Information Items Discharge  Pain Level: 0 Discharge Condition: Stable Ambulatory Status: Wheelchair Discharge Destination: Home Transportation: Private Auto Accompanied By: cousin Schedule Follow-up Appointment: Yes Medication Reconciliation completed No and provided to Patient/Care Nyair Depaulo: Provided on Clinical Summary of Care: 12/24/2016 Form Type Recipient Paper Patient JS Electronic Signature(s) Signed: 12/24/2016 3:01:11 PM By: Ruthine Dose Entered By: Ruthine Dose on 12/24/2016 15:01:10 Hunter Hardin ([id number]) -------------------------------------------------------------------------------- Lower Extremity Assessment Details Patient Name: Hunter Hardin Date of Service: 12/24/2016 2:00 PM Medical Record Number: [id number] Patient Account Number: [id number] Date of Birth/Sex: [birth year] (82 y.o. Male) Treating RN: Montey Hora Primary Care Physician: Elsie Stain Other Clinician: Referring Physician: Elsie Stain Treating Physician/Extender: Frann Rider in Treatment: 4 Vascular Assessment Pulses: Dorsalis Pedis Palpable: [Left:Yes] [Right:Yes] Posterior Tibial Extremity colors, hair growth, and conditions: Extremity Color: [Left:Hyperpigmented] [Right:Hyperpigmented] Hair Growth on Extremity: [Left:No] [Right:No] Temperature of Extremity: [Left:Cool] [Right:Cool] Capillary Refill: [Left:< 3 seconds] [Right:< 3 seconds] Electronic Signature(s) Signed: 12/24/2016 3:52:15 PM By: Montey Hora Entered By: Montey Hora on 12/24/2016 14:24:11 Hunter Hardin ([id number]) -------------------------------------------------------------------------------- Multi Wound Chart Details Patient Name: Hunter Hardin. Date of Service: 12/24/2016 2:00 PM Medical Record Number: [id number] Patient Account Number: [id number] Date of Birth/Sex: 1935/03/31 (82 y.o. Male) Treating RN: Montey Hora Primary Care Physician: Elsie Stain Other Clinician: Referring Physician: Elsie Stain Treating Physician/Extender: Frann Rider in Treatment: 4 Vital Signs Height(in): 67 Pulse(bpm): 68 Weight(lbs): 138 Blood Pressure 112/59 (mmHg): Body Mass Index(BMI): 22 Temperature(F): 97.7 Respiratory Rate 18 (breaths/min): Photos: Wound Location: Right Lower Leg - Left, Medial Malleolus Left, Lateral Malleolus Circumfernential Wounding Event: Gradually Appeared Gradually Appeared Gradually Appeared Primary Etiology: Venous Leg Ulcer Venous Leg Ulcer Venous Leg Ulcer Comorbid History: Osteoarthritis, Received N/A Osteoarthritis, Received Chemotherapy Chemotherapy Date Acquired: 11/09/2016 11/09/2016 11/09/2016 Weeks of Treatment: '4 4 4 '$ Wound Status: Open Healed - Epithelialized Healed - Epithelialized Clustered Wound: Yes Yes No Measurements L  x W x D 12.3x9.4x0.1 0x0x0 0x0x0 (cm) Area (cm) : 90.808 0 0 Volume (cm) : 9.081 0 0 % Reduction in Area: 37.60% 100.00% 100.00% % Reduction in Volume: 37.60% 100.00% 100.00% Classification: Partial Thickness Partial Thickness Partial Thickness Exudate Amount: Large N/A Small Exudate Type: Serous N/A Serous Exudate Color: amber N/A amber Wound Margin: Flat and Intact N/A Flat and Intact Granulation Amount: Medium (34-66%) N/A None Present (0%) Granulation Quality: Red N/A N/A Necrotic Amount: Medium (34-66%) N/A Large (67-100%) Hunter Hardin, Hunter Hardin ([id number]) Necrotic Tissue: Adherent Slough N/A Eschar Exposed Structures: Fascia: No N/A Fascia: No Fat: No Fat: No Tendon: No Tendon: No Muscle: No Muscle: No Joint: No Joint: No Bone: No Bone: No Limited to Skin Limited to Skin Breakdown Breakdown Epithelialization: Small (1-33%) N/A None Periwound Skin Texture: Edema: No No Abnormalities Noted Edema: No Excoriation: No Excoriation: No Induration: No Induration: No Callus: No Callus: No Crepitus: No Crepitus: No Fluctuance: No Fluctuance: No Friable: No Friable: No Rash: No Rash:  No Scarring: No Scarring: No Periwound Skin Maceration: Yes No Abnormalities Noted Moist: Yes Moisture: Moist: Yes Maceration: No Dry/Scaly: No Dry/Scaly: No Periwound Skin Color: Erythema: Yes No Abnormalities Noted Erythema: Yes Atrophie Blanche: No Atrophie Blanche: No Cyanosis: No Cyanosis: No Ecchymosis: No Ecchymosis: No Hemosiderin Staining: No Hemosiderin Staining: No Mottled: No Mottled: No Pallor: No Pallor: No Rubor: No Rubor: No Erythema Location: Circumferential N/A Circumferential Temperature: No Abnormality N/A No Abnormality Tenderness on Yes No Yes Palpation: Wound Preparation: Ulcer Cleansing: N/A Ulcer Cleansing: Rinsed/Irrigated with Rinsed/Irrigated with Saline Saline Topical Anesthetic Topical Anesthetic Applied: Other: lidocaine Applied: Other: lidocaine 4% 4% Treatment Notes Wound #1 (Right, Circumferential Lower Leg) 1. Cleansed with: Clean wound with Normal Saline 2. Anesthetic Topical Lidocaine 4% cream to wound bed prior to debridement 4. Dressing Applied: Hunter Hardin, Hunter Hardin ([id number]) Aquacel Ag 5. Secondary Dressing Applied ABD Pad 7. Secured with Tape Notes kerlix and Event organiser) Signed: 12/24/2016 2:48:00 PM By: Christin Fudge MD, FACS Entered By: Christin Fudge on 12/24/2016 14:48:00 Hunter Hardin ([id number]) -------------------------------------------------------------------------------- [place] Details Patient Name: MANASES, ETCHISON. Date of Service: 12/24/2016 2:00 PM Medical Record Number: [id number] Patient Account Number: [id number] Date of Birth/Sex: 03-14-1935 (82 y.o. Male) Treating RN: Montey Hora Primary Care Physician: Elsie Stain Other Clinician: Referring Physician: Elsie Stain Treating Physician/Extender: Frann Rider in Treatment: 4 Active Inactive Abuse / Safety / Falls / Self Care Management Nursing Diagnoses: Impaired physical  mobility Potential for falls Goals: Patient will remain injury free Date Initiated: 11/26/2016 Goal Status: Active Interventions: Assess fall risk on admission and as needed Notes: Nutrition Nursing Diagnoses: Potential for alteratiion in Nutrition/Potential for imbalanced nutrition Goals: Patient/caregiver agrees to and verbalizes understanding of need to use nutritional supplements and/or vitamins as prescribed Date Initiated: 11/26/2016 Goal Status: Active Interventions: Assess patient nutrition upon admission and as needed per policy Notes: Orientation to the Wound Care Program Nursing Diagnoses: Knowledge deficit related to the wound healing center program Goals: Hunter Hardin, Hunter Hardin ([id number]) Patient/caregiver will verbalize understanding of the [facility] Date Initiated: 11/26/2016 Goal Status: Active Interventions: Provide education on orientation to the wound center Notes: Venous Leg Ulcer Nursing Diagnoses: Potential for venous Insuffiency (use before diagnosis confirmed) Goals: Non-invasive venous studies are completed as ordered Date Initiated: 11/26/2016 Goal Status: Active Patient will maintain optimal edema control Date Initiated: 11/26/2016 Goal Status: Active Interventions: Assess peripheral edema status every visit. Notes: Wound/Skin Impairment Nursing Diagnoses: Impaired tissue integrity Goals: Patient/caregiver will  verbalize understanding of skin care regimen Date Initiated: 11/26/2016 Goal Status: Active Ulcer/skin breakdown will have a volume reduction of 30% by week 4 Date Initiated: 11/26/2016 Goal Status: Active Ulcer/skin breakdown will have a volume reduction of 50% by week 8 Date Initiated: 11/26/2016 Goal Status: Active Ulcer/skin breakdown will have a volume reduction of 80% by week 12 Date Initiated: 11/26/2016 Goal Status: Active Ulcer/skin breakdown will heal within 14 weeks Date Initiated: 11/26/2016 Hunter Hardin, Hunter Hardin ([id number]) Goal Status: Active Interventions: Assess patient/caregiver ability to obtain necessary supplies Assess patient/caregiver ability to perform ulcer/skin care regimen upon admission and as needed Assess ulceration(s) every visit Notes: Electronic Signature(s) Signed: 12/24/2016 3:52:15 PM By: Montey Hora Entered By: Montey Hora on 12/24/2016 14:39:22 Hunter Hardin ([id number]) -------------------------------------------------------------------------------- Pain Assessment Details Patient Name: Hunter Hardin. Date of Service: 12/24/2016 2:00 PM Medical Record Number: [id number] Patient Account Number: [id number] Date of Birth/Sex: July 21, 1935 (82 y.o. Male) Treating RN: Montey Hora Primary Care Physician: Elsie Stain Other Clinician: Referring Physician: Elsie Stain Treating Physician/Extender: Frann Rider in Treatment: 4 Active Problems Location of Pain Severity and Description of Pain Patient Has Paino Yes Site Locations Pain Location: Pain in Ulcers With Dressing Change: Yes Duration of the Pain. Constant / Intermittento Constant Character of Pain Describe the Pain: Other: tingling Pain Management and Medication Current Pain Management: Electronic Signature(s) Signed: 12/24/2016 3:52:15 PM By: Montey Hora Entered By: Montey Hora on 12/24/2016 14:07:55 Hunter Hardin ([id number]) -------------------------------------------------------------------------------- Patient/Caregiver Education Details Patient Name: Hunter Hardin. Date of Service: 12/24/2016 2:00 PM Medical Record Number: [id number] Patient Account Number: [id number] Date of Birth/Gender: [birth year] (82 y.o. Male) Treating RN: Montey Hora Primary Care Physician: Elsie Stain Other Clinician: Referring Physician: Elsie Stain Treating Physician/Extender: Frann Rider in Treatment: 4 Education Assessment Education Provided To: Patient and  Caregiver Education Topics Provided Wound/Skin Impairment: Handouts: Other: wound care as ordered Methods: Demonstration, Explain/Verbal Responses: State content correctly Electronic Signature(s) Signed: 12/24/2016 3:52:15 PM By: Montey Hora Entered By: Montey Hora on 12/24/2016 14:45:36 Hunter Hardin ([id number]) -------------------------------------------------------------------------------- Wound Assessment Details Patient Name: Hunter Hardin. Date of Service: 12/24/2016 2:00 PM Medical Record Number: [id number] Patient Account Number: [id number] Date of Birth/Sex: 1935-02-18 (82 y.o. Male) Treating RN: Montey Hora Primary Care Physician: Elsie Stain Other Clinician: Referring Physician: Elsie Stain Treating Physician/Extender: Frann Rider in Treatment: 4 Wound Status Wound Number: 1 Primary Venous Leg Ulcer Etiology: Wound Location: Right Lower Leg - Circumfernential Wound Status: Open Wounding Event: Gradually Appeared Comorbid Osteoarthritis, Received History: Chemotherapy Date Acquired: 11/09/2016 Weeks Of Treatment: 4 Clustered Wound: Yes Photos Wound Measurements Length: (cm) 12.3 Width: (cm) 9.4 Depth: (cm) 0.1 Area: (cm) 90.808 Volume: (cm) 9.081 % Reduction in Area: 37.6% % Reduction in Volume: 37.6% Epithelialization: Small (1-33%) Tunneling: No Undermining: No Wound Description Classification: Partial Thickness Foul Odor Afte Wound Margin: Flat and Intact Exudate Amount: Large Exudate Type: Serous Exudate Color: amber r Cleansing: No Wound Bed Granulation Amount: Medium (34-66%) Exposed Structure Granulation Quality: Red Fascia Exposed: No Necrotic Amount: Medium (34-66%) Fat Layer Exposed: No Necrotic Quality: Adherent Slough Tendon Exposed: No Muscle Exposed: No Joint Exposed: No Bone Exposed: No Hunter Hardin, Hunter C. ([id number]) Limited to Skin Breakdown Periwound Skin Texture Texture Color No Abnormalities  Noted: No No Abnormalities Noted: No Callus: No Atrophie Blanche: No Crepitus: No Cyanosis: No Excoriation: No Ecchymosis: No Fluctuance: No Erythema: Yes Friable: No Erythema Location: Circumferential Induration: No Hemosiderin Staining: No Localized Edema: No Mottled: No Rash: No Pallor: No Scarring:  No Rubor: No Moisture Temperature / Pain No Abnormalities Noted: No Temperature: No Abnormality Dry / Scaly: No Tenderness on Palpation: Yes Maceration: Yes Moist: Yes Wound Preparation Ulcer Cleansing: Rinsed/Irrigated with Saline Topical Anesthetic Applied: Other: lidocaine 4%, Treatment Notes Wound #1 (Right, Circumferential Lower Leg) 1. Cleansed with: Clean wound with Normal Saline 2. Anesthetic Topical Lidocaine 4% cream to wound bed prior to debridement 4. Dressing Applied: Aquacel Ag 5. Secondary Dressing Applied ABD Pad 7. Secured with Tape Notes kerlix and Event organiser) Signed: 12/24/2016 3:52:15 PM By: Montey Hora Entered By: Montey Hora on 12/24/2016 14:22:34 Hunter Hardin ([id number]) -------------------------------------------------------------------------------- Wound Assessment Details Patient Name: Hunter Hardin Date of Service: 12/24/2016 2:00 PM Medical Record Number: [id number] Patient Account Number: [id number] Date of Birth/Sex: 09/03/1935 (82 y.o. Male) Treating RN: Montey Hora Primary Care Physician: Elsie Stain Other Clinician: Referring Physician: Elsie Stain Treating Physician/Extender: Frann Rider in Treatment: 4 Wound Status Wound Number: 2 Primary Etiology: Venous Leg Ulcer Wound Location: Left, Medial Malleolus Wound Status: Healed - Epithelialized Wounding Event: Gradually Appeared Date Acquired: 11/09/2016 Weeks Of Treatment: 4 Clustered Wound: Yes Photos Photo Uploaded By: Montey Hora on 12/24/2016 14:23:19 Wound Measurements Length: (cm) 0 % Reduction Width: (cm) 0 %  Reduction Depth: (cm) 0 Area: (cm) 0 Volume: (cm) 0 in Area: 100% in Volume: 100% Wound Description Classification: Partial Thickness Periwound Skin Texture Texture Color No Abnormalities Noted: No No Abnormalities Noted: No Moisture No Abnormalities Noted: No Electronic Signature(s) Signed: 12/24/2016 3:52:15 PM By: Montey Hora Entered By: Montey Hora on 12/24/2016 14:14:06 Hunter Hardin ([id number]) -------------------------------------------------------------------------------- Wound Assessment Details Patient Name: Hunter Hardin. Date of Service: 12/24/2016 2:00 PM Medical Record Number: [id number] Patient Account Number: [id number] Date of Birth/Sex: [birth year] (82 y.o. Male) Treating RN: Montey Hora Primary Care Physician: Elsie Stain Other Clinician: Referring Physician: Elsie Stain Treating Physician/Extender: Frann Rider in Treatment: 4 Wound Status Wound Number: 3 Primary Venous Leg Ulcer Etiology: Wound Location: Left, Lateral Malleolus Wound Status: Healed - Epithelialized Wounding Event: Gradually Appeared Comorbid Osteoarthritis, Received Date Acquired: 11/09/2016 History: Chemotherapy Weeks Of Treatment: 4 Clustered Wound: No Photos Wound Measurements Length: (cm) 0 % Reduction in Width: (cm) 0 % Reduction in Depth: (cm) 0 Epithelializat Area: (cm) 0 Tunneling: Volume: (cm) 0 Undermining: Area: 100% Volume: 100% ion: None No No Wound Description Classification: Partial Thickness Wound Margin: Flat and Intact Exudate Amount: Small Exudate Type: Serous Exudate Color: amber Foul Odor After Cleansing: No Wound Bed Granulation Amount: None Present (0%) Exposed Structure Necrotic Amount: Large (67-100%) Fascia Exposed: No Necrotic Quality: Eschar Fat Layer Exposed: No Tendon Exposed: No Muscle Exposed: No Joint Exposed: No Bone Exposed: No Hunter Hardin, Hunter Hardin. ([id number]) Limited to Skin  Breakdown Periwound Skin Texture Texture Color No Abnormalities Noted: No No Abnormalities Noted: No Callus: No Atrophie Blanche: No Crepitus: No Cyanosis: No Excoriation: No Ecchymosis: No Fluctuance: No Erythema: Yes Friable: No Erythema Location: Circumferential Induration: No Hemosiderin Staining: No Localized Edema: No Mottled: No Rash: No Pallor: No Scarring: No Rubor: No Moisture Temperature / Pain No Abnormalities Noted: No Temperature: No Abnormality Dry / Scaly: No Tenderness on Palpation: Yes Maceration: No Moist: Yes Wound Preparation Ulcer Cleansing: Rinsed/Irrigated with Saline Topical Anesthetic Applied: Other: lidocaine 4%, Electronic Signature(s) Signed: 12/24/2016 3:52:15 PM By: Montey Hora Entered By: Montey Hora on 12/24/2016 14:41:10 Hunter Hardin ([id number]) -------------------------------------------------------------------------------- Vitals Details Patient Name: Hunter Hardin Date of Service: 12/24/2016 2:00 PM Medical Record Number: [id number] Patient Account Number: [id number] Date  of Birth/Sex: 1935/12/05 (82 y.o. Male) Treating RN: Montey Hora Primary Care Physician: Elsie Stain Other Clinician: Referring Physician: Elsie Stain Treating Physician/Extender: Frann Rider in Treatment: 4 Vital Signs Time Taken: 14:07 Temperature (F): 97.7 Height (in): 67 Pulse (bpm): 68 Weight (lbs): 138 Respiratory Rate (breaths/min): 18 Body Mass Index (BMI): 21.6 Blood Pressure (mmHg): 112/59 Reference Range: 80 - 120 mg / dl Electronic Signature(s) Signed: 12/24/2016 3:52:15 PM By: Montey Hora Entered By: Montey Hora on 12/24/2016 14:09:41

## 2016-12-25 NOTE — Telephone Encounter (Signed)
Robin nurse with [facility] left v/m wanting to know about orders for hospice to do wound care and Shirlean Mylar will contact wound center to find out what wound care pt is presently receiving so could be transferred to hospice. Also when pt has frequent SOB pt goes to ED for thoracentesis to have fluid drained off chest. If pt had a implanted catheter then hospice could take care of for pt. I spoke with Shirlean Mylar and notified her of Dr Carole Civil instruction on 12/24/16, Shirlean Mylar voiced understanding and will work on wound care and will wait for cb about the implanted catheter. At this time pt does not have implanted catheter.

## 2016-12-25 NOTE — ED Notes (Signed)
Pt provided with meal tray and npo after midnight status explained to pt who verbalizes understanding. Call bell at side.

## 2016-12-25 NOTE — ED Triage Notes (Signed)
Hospice patient, lung and kidney cancer, pt sent for thoracentesis, last fluid draw 12/24 and they took off 1.3L,  States increased SOB that since this AM, awake and alert, speaking in full sentences, family states pt to have pleurex cath placed on Monday

## 2016-12-25 NOTE — Telephone Encounter (Signed)
Patient's daughter,Donna,called to get status of her fmla paperwork. Patient was taken to the ER today and she has to wait for the paperwork before she can leave. Butch Penny can be reached at [phone].

## 2016-12-25 NOTE — Telephone Encounter (Signed)
Given to Shirlean Mylar to process.

## 2016-12-26 ENCOUNTER — Observation Stay

## 2016-12-26 DIAGNOSIS — J90 Pleural effusion, not elsewhere classified: Secondary | ICD-10-CM

## 2016-12-26 DIAGNOSIS — L8990 Pressure ulcer of unspecified site, unspecified stage: Secondary | ICD-10-CM | POA: Insufficient documentation

## 2016-12-26 DIAGNOSIS — N401 Enlarged prostate with lower urinary tract symptoms: Secondary | ICD-10-CM | POA: Diagnosis not present

## 2016-12-26 DIAGNOSIS — R1310 Dysphagia, unspecified: Secondary | ICD-10-CM | POA: Diagnosis not present

## 2016-12-26 DIAGNOSIS — C7800 Secondary malignant neoplasm of unspecified lung: Secondary | ICD-10-CM | POA: Diagnosis not present

## 2016-12-26 DIAGNOSIS — J910 Malignant pleural effusion: Secondary | ICD-10-CM | POA: Diagnosis not present

## 2016-12-26 DIAGNOSIS — C649 Malignant neoplasm of unspecified kidney, except renal pelvis: Secondary | ICD-10-CM | POA: Diagnosis not present

## 2016-12-26 DIAGNOSIS — R634 Abnormal weight loss: Secondary | ICD-10-CM | POA: Diagnosis not present

## 2016-12-26 LAB — CBC
HCT: 35.3 % — ABNORMAL LOW (ref 40.0–52.0)
Hemoglobin: 12.1 g/dL — ABNORMAL LOW (ref 13.0–18.0)
MCH: 35.1 pg — AB (ref 26.0–34.0)
MCHC: 34.3 g/dL (ref 32.0–36.0)
MCV: 102.1 fL — AB (ref 80.0–100.0)
PLATELETS: 180 10*3/uL (ref 150–440)
RBC: 3.45 MIL/uL — AB (ref 4.40–5.90)
RDW: 16.1 % — ABNORMAL HIGH (ref 11.5–14.5)
WBC: 8.2 10*3/uL (ref 3.8–10.6)

## 2016-12-26 LAB — BASIC METABOLIC PANEL
Anion gap: 5 (ref 5–15)
BUN: 32 mg/dL — AB (ref 6–20)
CO2: 26 mmol/L (ref 22–32)
Calcium: 8.4 mg/dL — ABNORMAL LOW (ref 8.9–10.3)
Chloride: 106 mmol/L (ref 101–111)
Creatinine, Ser: 1.04 mg/dL (ref 0.61–1.24)
GFR calc Af Amer: >60 mL/min (ref >60)
GLUCOSE: 83 mg/dL (ref 65–99)
Potassium: 4.8 mmol/L (ref 3.5–5.1)
Sodium: 137 mmol/L (ref 135–145)

## 2016-12-26 MED ORDER — GUAIFENESIN-DM 100-10 MG/5ML PO SYRP
5.0000 mL | ORAL_SOLUTION | ORAL | Status: DC | PRN
  Administered 2016-12-26: 05:00:00 5 mL via ORAL
  Filled 2016-12-26: qty 5

## 2016-12-26 MED ORDER — SODIUM CHLORIDE 0.9% FLUSH
3.0000 mL | Freq: Two times a day (BID) | INTRAVENOUS | Status: DC
  Administered 2016-12-26 – 2016-12-27 (×3): 3 mL via INTRAVENOUS

## 2016-12-26 MED ORDER — PROPRANOLOL HCL 20 MG PO TABS
20.0000 mg | ORAL_TABLET | Freq: Two times a day (BID) | ORAL | Status: DC
  Administered 2016-12-27: 20 mg via ORAL
  Filled 2016-12-26: qty 1

## 2016-12-26 NOTE — Care Management Obs Status (Signed)
[facility] NOTIFICATION   Patient Details  Name: Hunter Hardin. MRN: [id number] Date of Birth: May 17, 1935   Medicare Observation Status Notification Given:  Yes    CrutchfieldAntony Haste, RN 12/26/2016, 7:15 PM

## 2016-12-26 NOTE — Progress Notes (Signed)
Pt sitting up in chair feeding himself lunch. States, "I feel so much better." Asymptomatic low blood pressure. 1.2 liter pleural fluid removed with pt tolerating procedure well. Diminished breath sounds. Bandaid dry/intact on left posterior ribcage-thoracentesis site.  Pt denies feeling weak or lightheadedness.  Pt has call bell in reach. Instructed pt and friend to call nurse if any symptoms present and do not try to transfer from chair to bed without nursing staff- stated they understood. Dr. Felicie Morn made aware with no new orders. Radiology called and in for Chest xray.

## 2016-12-26 NOTE — Consult Note (Addendum)
[facility] Consultation      Assessment and Plan:  Malignant left pleural effusion with progressive lung entrapment/pneumothorax.  --Discussed with patient and family member at bedside, they understand that he has chronic lung collapse/entrapment likely due to underlying cancer, that that there will be limited benefit from effusion drainage and PleurX placement. He understands that he has a terminal disease and would like to proceed to provide symptomatic benefit.  --Will perform left thoracentesis, the patient will have persistent pneumothorax after procedure. He can be disharged home and can go for his PleurX procedure on Monday.   Note: the patient will have persistent chronic pneumothorax after procedure. This is chronic and represents lung entrapment from cancer. He can be safely discharged to hospice to undergo PleurX catheter placement on Monday.   Acute respiratory failure.  --Secondary to above. Will proceed with thoracentesis.   Date: 12/26/2016  MRN# [id number] Hunter Hardin. February 16, 1935  Referring Physician:   [name]. is a 82 y.o. old male seen in consultation for chief complaint of:    Chief Complaint  Patient presents with  . Shortness of Breath    HPI:   The patient is a 82 yo male with Renal cell carcinoma diagnosed in May of 2014, stage 4 disease with lung involvement.  He presented with kidney mass and lung metastasis. 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 His history is obtained mostly from his caregiver who is his cousin, the patient is hard of hearing. The caregiver notes that the patient has recently been taken off of chemo and been placed in hospice, and she understands that he is terminal. He has undergone multiple thoracentesis over the past month as below.  S/p thoracentesis on  12/13/16 with removal of 1.1L; post CXR  residual effusion/PTX.  12/02/16 with removal of 1L; post CXR shows large left PTX.  --CT chest 12/5 showed large left effusion.  10/31/16 removal of 1.6L  He had reaccumulating pleural fluid, when he had the fluid drained he would note that his breathing was better. He was noted to have lung entrapment with chronic pneumothorax, he was evaluated by CT surgery at [facility], and felt not to be an operative candidate. Therefore a pleurX catheter was recommended, and this was scheduled on 1/8. However he became dyspneic again,  they called his hospice nurse because his breathing was worse, he was having orthopnea difficulty breathing with minimal exertion. He was referred to [facility] where a drainage was to be performed by IR, however they were not available today.  I was consulted for drainage.   T3 N0 colon cancer: He is status post a laparoscopic right hemicolectomy, with 0 out of 21 lymph nodes involved. That was done on 03/06/2013.   PMHX:   Past Medical History:  Diagnosis Date  . Colon cancer ([place]) dx'd 01/2013  . Elevated PSA    H/O  . GERD (gastroesophageal reflux disease)   . HLD (hyperlipidemia)   . Hypertension   . Hypothyroidism   . Metastasis to lung ([place]) dx'd 01/2013  . Pleural effusion   . Protein calorie malnutrition ([place]) 06/13/2013  . renal ca dx'd 01/2013   Surgical Hx:  Past Surgical History:  Procedure Laterality Date  . CHOLECYSTECTOMY  early 33's  . COLONOSCOPY  01/2013  . LAPAROSCOPIC PARTIAL COLECTOMY N/A 03/06/2013   Procedure: LAPAROSCOPic assisted right hemi COLECTOMY;  Surgeon: Leighton Ruff, MD;  Location: [facility] ORS;  Service: General;  Laterality: N/A;  . VASECTOMY     Family Hx:  Family History  Problem Relation Age of Onset  . Hypertension Mother   . Hyperlipidemia Mother   . Dementia Mother   . Heart disease Father     ? MI, CAD  . Cancer Brother     oral/tonsil cancer 2012  . Cancer Sister     breast cancer  . Diabetes Cousin   . Colon cancer Neg Hx   .  Prostate cancer Neg Hx    Social Hx:   Social History  Substance Use Topics  . Smoking status: Former Smoker    Packs/day: 2.00    Years: 30.00    Types: Cigarettes    Quit date: 12/21/1985  . Smokeless tobacco: Never Used  . Alcohol use 0.0 oz/week     Comment: beer occassionally   Medication:       Allergies:  Patient has no known allergies.  Review of Systems: Gen:  Denies  fever, sweats, chills. Appears weak, malnourished.  HEENT: Denies blurred vision, double vision. bleeds, sore throat Cvc:  No dizziness, chest pain. Resp:   Denies cough or sputum production,  Gi: Denies swallowing difficulty, stomach pain. Gu:  Denies bladder incontinence, burning urine Ext:   No Joint pain, stiffness. Skin: No skin rash,  hives  Endoc:  No polyuria, polydipsia. Psych: No depression, insomnia. Other:  All other systems were reviewed with the patient and were negative other that what is mentioned in the HPI.   Physical Examination:   VS: BP (!) 110/51 (BP Location: Left Arm)   Pulse 69   Temp 97.8 F (36.6 C) (Oral)   Resp 20   Ht '5\' 11"'$  (1.803 m)   Wt 125 lb (56.7 kg)   SpO2 93%   BMI 17.43 kg/m   General Appearance: No distress  Neuro:without focal findings,  speech normal,  HEENT: PERRLA, EOM intact.   Pulmonary: normal breath sounds, decreased air entry left lung, with dullness to percussion.  CardiovascularNormal S1,S2.  No m/r/g.   Abdomen: Benign, Soft, non-tender. Renal:  No costovertebral tenderness  GU:  No performed at this time. Endoc: No evident thyromegaly, no signs of acromegaly. Skin:   warm, no rashes, no ecchymosis  Extremities: normal, no cyanosis, clubbing.  Other findings:    LABORATORY PANEL:   CBC  Recent Labs Lab 12/26/16 0428  WBC 8.2  HGB 12.1*  HCT 35.3*  PLT 180   ------------------------------------------------------------------------------------------------------------------  Chemistries   Recent Labs Lab 12/23/16 0817   12/26/16 0428  NA 135*  < > 137  K 4.1  < > 4.8  CL  --   < > 106  CO2 24  < > 26  GLUCOSE 125  < > 83  BUN 27.4*  < > 32*  CREATININE 1.2  < > 1.04  CALCIUM 8.5  < > 8.4*  AST 16  --   --   ALT 16  --   --   ALKPHOS 75  --   --   BILITOT 0.61  --   --   < > = values in this interval not displayed. ------------------------------------------------------------------------------------------------------------------  Cardiac Enzymes No results for input(s): TROPONINI in the last 168 hours. ------------------------------------------------------------  RADIOLOGY:  Dg Chest 2 View  Result Date: 12/25/2016 CLINICAL DATA:  Shortness of breath, history of pleural effusion, lung cancer EXAM: CHEST  2 VIEW COMPARISON:  12/13/2016 FINDINGS: Cardiac  size is stable. There is large left pleural effusion increased in size from prior exam. Worsening consolidation left mid and left lower lung. Small left upper hydropneumothorax again noted. The left apical pneumothorax measures 2.4 cm maximum thickness. IMPRESSION: There is large left pleural effusion increased in size from prior exam. Again noted left upper hydropneumothorax. Left apical pneumothorax measures 2.4 cm maximum thickness. Worsening consolidation in left lower lung. Electronically Signed   By: Lahoma Crocker M.D.   On: 12/25/2016 16:46       Thank  you for the consultation and for allowing [facility] to assist in the care of your patient. Our recommendations are noted above.  Please contact us if we can be of further service.   Marda Stalker, MD.  Board Certified in Internal Medicine, Pulmonary Medicine, [place], and Sleep Medicine.  [facility] Office Number: [phone]  Patricia Pesa, M.D.  Vilinda Boehringer, M.D.  Merton Border, M.D  12/26/2016

## 2016-12-26 NOTE — Progress Notes (Signed)
Dr. Leslye Peer on unit with update given including hypotension. Will continue to observe and monitor vs's.

## 2016-12-26 NOTE — Procedures (Signed)
PROCEDURE NOTE: THORACENTESIS    Date: 12/26/2016,   Loc:104A/104A-AA MRN# [id number] Hunter Hardin.   Indication: Pleural effusion of left side with chest pain, dyspnea, chronic lung entrapment.  A time-out was completed verifying correct patient, procedure, site, positioning, and implant(s) or special equipment if applicable.  Ultrasound guidance was not used   Patient was positioned sitting up.   8Fr thora kit was used, the 9th interspace in lat scapular line was marked, prepped, draped in sterile fashion.   1% Lidocaine was used to anesthetize the area. Bloody fluid was removed up to 1.2 L. Further fluid was likely present, however no further fluid was drained to leave some fluid for the pleurX procedure. No complications were noted.   A chest xray was ordered to evaluate for residual pleural effusion. A persistent pneumothorax is expected.    Patient tolerated the procedure well and there were no complications.  Marda Stalker, M.D. Pulmonary & Critical Care Medicine

## 2016-12-26 NOTE — Progress Notes (Signed)
Patient ID: Hunter Balloon., male   DOB: May 05, 1935, 82 y.o.   MRN: [id number]  [employer] PROGRESS NOTE  Hunter Balloon. KWI:[id number] DOB: Sep 09, 1935 DOA: 12/25/2016 PCP: Elsie Stain, MD  HPI/Subjective: Patient seen earlier this morning and again this afternoon. Patient feels a lot better after I organized a thoracentesis. He is breathing a lot better. This morning he was having a lot of shortness of breath and cough. This afternoon his blood pressures a little bit low. Family very concerned about taking him home because nobody would be there with him overnight.  Objective: Vitals:   12/26/16 1352 12/26/16 1432  BP: (!) 95/50 (!) 90/52  Pulse: 66 65  Resp: 20 20  Temp:      Filed Weights   12/25/16 1542  Weight: 56.7 kg (125 lb)    ROS: Review of Systems  Constitutional: Negative for chills and fever.  Eyes: Negative for blurred vision.  Respiratory: Positive for cough and shortness of breath.   Cardiovascular: Negative for chest pain.  Gastrointestinal: Negative for abdominal pain, constipation, diarrhea, nausea and vomiting.  Genitourinary: Negative for dysuria.  Musculoskeletal: Negative for joint pain.  Neurological: Negative for dizziness and headaches.   Exam: Physical Exam  Constitutional: He is oriented to person, place, and time.  HENT:  Nose: No mucosal edema.  Mouth/Throat: No oropharyngeal exudate or posterior oropharyngeal edema.  Eyes: Conjunctivae, EOM and lids are normal. Pupils are equal, round, and reactive to light.  Neck: No JVD present. Carotid bruit is not present. No edema present. No thyroid mass and no thyromegaly present.  Cardiovascular: S1 normal and S2 normal.  Exam reveals no gallop.   No murmur heard. Pulses:      Dorsalis pedis pulses are 2+ on the right side, and 2+ on the left side.  Respiratory: No respiratory distress. He has decreased breath sounds in the left middle field and the left lower field. He has no wheezes.  He has rhonchi in the left lower field. He has no rales.  GI: Soft. Bowel sounds are normal. There is no tenderness.  Musculoskeletal:       Right ankle: He exhibits no swelling.       Left ankle: He exhibits no swelling.  Lymphadenopathy:    He has no cervical adenopathy.  Neurological: He is alert and oriented to person, place, and time. No cranial nerve deficit.  Skin: Skin is warm. No rash noted. Nails show no clubbing.  Psychiatric: He has a normal mood and affect.      Data Reviewed: Basic Metabolic Panel:  Recent Labs Lab 12/23/16 0817 12/25/16 1716 12/26/16 0428  NA 135* 132* 137  K 4.1 5.2* 4.8  CL  --  101 106  CO2 '24 27 26  '$ GLUCOSE 125 85 83  BUN 27.4* 32* 32*  CREATININE 1.2 1.26* 1.04  CALCIUM 8.5 8.7* 8.4*   Liver Function Tests:  Recent Labs Lab 12/23/16 0817  AST 16  ALT 16  ALKPHOS 75  BILITOT 0.61  PROT 5.9*  ALBUMIN 2.2*   CBC:  Recent Labs Lab 12/23/16 0817 12/25/16 1716 12/26/16 0428  WBC 6.5 8.9 8.2  NEUTROABS 4.3 6.0  --   HGB 12.7* 12.6* 12.1*  HCT 38.3* 37.3* 35.3*  MCV 101.6* 102.2* 102.1*  PLT 116* 188 180     Studies: Dg Chest 1 View  Result Date: 12/26/2016 CLINICAL DATA:  Left-sided thoracentesis. EXAM: CHEST 1 VIEW COMPARISON:  December 25, 2016 FINDINGS: The left-sided hydropneumothorax  is again identified with a large effusion and a 2.2 cm apical pneumothorax. No right-sided pneumothorax. No other interval changes. IMPRESSION: Continued left hydropneumothorax, unchanged in the interval. Electronically Signed   By: Dorise Bullion III M.D   On: 12/26/2016 14:08   Dg Chest 2 View  Result Date: 12/25/2016 CLINICAL DATA:  Shortness of breath, history of pleural effusion, lung cancer EXAM: CHEST  2 VIEW COMPARISON:  12/13/2016 FINDINGS: Cardiac size is stable. There is large left pleural effusion increased in size from prior exam. Worsening consolidation left mid and left lower lung. Small left upper hydropneumothorax again  noted. The left apical pneumothorax measures 2.4 cm maximum thickness. IMPRESSION: There is large left pleural effusion increased in size from prior exam. Again noted left upper hydropneumothorax. Left apical pneumothorax measures 2.4 cm maximum thickness. Worsening consolidation in left lower lung. Electronically Signed   By: Lahoma Crocker M.D.   On: 12/25/2016 16:46    Scheduled Meds: . [START ON 12/28/2016]  ceFAZolin (ANCEF) IV  2 g Intravenous to XRAY  . doxazosin  8 mg Oral Daily  . enoxaparin (LOVENOX) injection  40 mg Subcutaneous Q24H  . finasteride  5 mg Oral Daily  . lactose free nutrition  237 mL Oral Daily  . levothyroxine  50 mcg Oral QAC breakfast  . propranolol  20 mg Oral BID  . sodium chloride flush  3 mL Intravenous Q12H  . sodium chloride flush  3 mL Intravenous Q12H  . verapamil  240 mg Oral Daily   Continuous Infusions: . [START ON 12/28/2016] sodium chloride      Assessment/Plan:  1. Persistent left pleural effusion with shortness of breath. This morning I spoke with interventional radiology and they stated that they would not do a thoracentesis on the weekend. The patient does have a Pleurx catheter scheduled for Monday at [facility]. I had to speak with critical care specialist to have him evaluate for thoracentesis.  The underwent a thoracentesis. Blood pressure little low after procedure. Family concerned about taking him home because he would be by himself. We'll watch overnight. 2. Hypotension post procedure. Patient placed on gentle fluids. Hold her evening medications. 3. Metastatic renal cell carcinoma to lung and pleural space. Overall prognosis is poor. Patient is followed by hospice and listed as full code. 4. Hypothyroidism unspecified on levothyroxine  Code Status:     Code Status Orders        Start     Ordered   12/25/16 2030  Full code  Continuous     12/25/16 2029    Code Status History    Date Active Date Inactive Code Status Order ID Comments  User Context   12/13/2016 12:35 AM 12/13/2016 10:49 PM Full Code [id number]  Toy Baker, MD Inpatient   10/30/2016  9:33 PM 10/31/2016  6:24 PM Full Code [id number]  Lily Kocher, MD Inpatient    Advance Directive Documentation   Flowsheet Row Most Recent Value  Type of Advance Directive  Healthcare Power of Attorney  Pre-existing out of facility DNR order (yellow form or pink MOST form)  No data  "MOST" Form in Place?  No data     Family Communication: Permission to speak in front of friends at the bedside this morning and permission to speak in rent of family this afternoon Disposition Plan: Home tomorrow with hospice  Consultants:  Critical care specialist  Procedures:  Left-sided thoracentesis  Time spent: 45 minutes in coordination of care and making phone calls to  coordinate thoracentesis for today.  Loletha Grayer  [employer]

## 2016-12-26 NOTE — Progress Notes (Signed)
Dr. Leslye Peer in and spoke with pt and family regarding thoracentesis scheduling.. They are aware that the thoracentesis situation; pt is scheduled for pleurex catheter insertion on Monday at [facility]. Continuous pulse ox continued with sats stable. Pt reports SOB; becomes dysneic upon exertion which resolves in approximately 1 minute with rest; no acute distress. Productive cough with pt clearing sputum occasionally. Cardiac diet started. Pt and family currently sleeping.

## 2016-12-26 NOTE — Progress Notes (Signed)
Dr. Leslye Peer in . Distant cousin and sister in law in room and state no one can stay with pt if discharged. . Pt reports, "I feel fine." BP stable at lower norms. Bandaid on thoracentesis site dry/intact. Per MD pt will plan to discharge tomorrow; PT eval. Ordered.

## 2016-12-26 NOTE — Progress Notes (Signed)
Dr. Felicie Morn in to see pt/friend. No new orders.

## 2016-12-27 DIAGNOSIS — R1310 Dysphagia, unspecified: Secondary | ICD-10-CM | POA: Diagnosis not present

## 2016-12-27 DIAGNOSIS — R634 Abnormal weight loss: Secondary | ICD-10-CM | POA: Diagnosis not present

## 2016-12-27 DIAGNOSIS — C7800 Secondary malignant neoplasm of unspecified lung: Secondary | ICD-10-CM | POA: Diagnosis not present

## 2016-12-27 DIAGNOSIS — C649 Malignant neoplasm of unspecified kidney, except renal pelvis: Secondary | ICD-10-CM | POA: Diagnosis not present

## 2016-12-27 DIAGNOSIS — J910 Malignant pleural effusion: Secondary | ICD-10-CM | POA: Diagnosis not present

## 2016-12-27 DIAGNOSIS — N401 Enlarged prostate with lower urinary tract symptoms: Secondary | ICD-10-CM | POA: Diagnosis not present

## 2016-12-27 MED ORDER — VERAPAMIL HCL ER 120 MG PO TBCR: 120.0000 mg | EXTENDED_RELEASE_TABLET | Freq: Every day | ORAL | 0 refills | Status: DC

## 2016-12-27 MED ORDER — VERAPAMIL HCL ER 240 MG PO TBCR
120.0000 mg | EXTENDED_RELEASE_TABLET | Freq: Every day | ORAL | Status: DC
  Administered 2016-12-27: 09:00:00 120 mg via ORAL
  Filled 2016-12-27: qty 1

## 2016-12-27 NOTE — Progress Notes (Signed)
74 Transported pt in [facility] to private vehicle discharged home with friend and her husband. Hunter Hardin states she has already notified Hospice and will call them upon arrival home.

## 2016-12-27 NOTE — Care Management Note (Addendum)
Case Management Note  Patient Details  Name: Hunter Hardin. MRN: [id number] Date of Birth: 12-18-1935  Subjective/Objective:       [name] is followed by [facility] and Palliative Care at home..           Action/Plan:   Expected Discharge Date:                  Expected Discharge Plan:     In-House Referral:     Discharge planning Services     Post Acute Care Choice:    Choice offered to:     DME Arranged:    DME Agency:     HH Arranged:    HH Agency:     Status of Service:     If discussed at [place] of Stay Meetings, dates discussed:    Additional Comments:  Juandiego Kolenovic A, RN 12/27/2016, 10:10 AM

## 2016-12-27 NOTE — Care Management Note (Signed)
Case Management Note  Patient Details  Name: Hunter Hardin. MRN: [id number] Date of Birth: Nov 06, 1935  Subjective/Objective:       Call to [facility] and Palliative Care to update them about discharge home today.              Action/Plan:   Expected Discharge Date:                  Expected Discharge Plan:     In-House Referral:     Discharge planning Services     Post Acute Care Choice:    Choice offered to:     DME Arranged:    DME Agency:     HH Arranged:    HH Agency:     Status of Service:     If discussed at [employer] of Stay Meetings, dates discussed:    Additional Comments:  Manila Rommel A, RN 12/27/2016, 10:35 AM

## 2016-12-27 NOTE — Discharge Summary (Signed)
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 Colon cancer ([place]) dx'd 01/2013  . Elevated PSA    H/O  . GERD (gastroesophageal reflux disease)   . HLD (hyperlipidemia)   . Hypertension   . Hypothyroidism   . Metastasis to lung ([place]) dx'd 01/2013  . Pleural effusion   . Protein calorie malnutrition ([place]) 06/13/2013  . renal ca dx'd 01/2013    HOSPITAL COURSE:   1.  Persistent left pleural effusion with shortness of breath. Interventional radiology at [facility] was unable to do procedure over the weekend. I had pulmonary do a thoracentesis to make the patient feel better with regards to his breathing. 1.2 liters taken off. Patient will keep his appointment at [facility] for Pleurx catheter on Monday.  2. Hypotension post procedure. I held the patient's evening medications last night and I decreased his dose of verapamil. 3. Metastatic renal cancer to lung and pleural space. Overall prognosis poor. Patient is followed by hospice. 4. Hypothyroidism unspecified on levothyroxine  DISCHARGE CONDITIONS:   Fair  CONSULTS OBTAINED:   pulmonary  DRUG ALLERGIES:  No Known Allergies  DISCHARGE MEDICATIONS:   Discharge Medication List as of 12/27/2016 11:06 AM    CONTINUE these medications which have CHANGED   Details  verapamil (CALAN-SR) 120 MG CR tablet Take 1 tablet (120 mg total) by mouth daily., Starting Mon 12/28/2016, Print      CONTINUE these medications which have NOT CHANGED   Details  doxazosin (CARDURA) 8 MG tablet TAKE 1 TABLET DAILY, Normal    finasteride (PROSCAR) 5 MG tablet Take 5 mg by mouth daily., Starting Thu 01/09/2016, Historical Med    furosemide (LASIX) 20 MG tablet Take 1 tablet (20 mg total) by mouth daily as needed for fluid., Starting Tue 12/01/2016, Normal    KLOR-CON M20 20 MEQ tablet TAKE 1 TABLET TWICE A DAY, Normal    lactose free nutrition (BOOST) LIQD Take 237 mLs by mouth daily., Historical Med    levothyroxine (SYNTHROID, LEVOTHROID) 50 MCG tablet TAKE 2 TABLETS ON SUNDAY AND 1 TABLET ON ALL OTHER DAYS (8 TABLETS A WEEK TOTAL), Normal    loperamide (IMODIUM A-D) 2 MG tablet Take 2 mg by mouth as needed for diarrhea or loose stools., Historical Med    magnesium chloride (SLOW-MAG) 64 MG TBEC SR tablet Take 2 tablets (128 mg total) by mouth daily., Starting Sun 12/13/2016, Normal    ondansetron (ZOFRAN) 8 MG tablet Take 1 tablet (8 mg total) by mouth every 8 (eight) hours as needed. for nausea, Starting Wed 02/26/2016, Normal    propranolol (INDERAL) 40 MG tablet Take 0.5 tablets (20 mg total) by mouth 2 (two) times daily., Starting Wed 06/05/2015, Normal    traMADol (ULTRAM) 50 MG tablet Take 1 tablet (50 mg total) by mouth every 8 (eight) hours as  needed., Starting Fri 12/04/2016, Phone In    vitamin C (ASCORBIC ACID) 500 MG tablet Take 500 mg by mouth daily., Historical Med      STOP taking these medications     fish oil-omega-3 fatty acids 1000 MG capsule      cabozantinib S-Malate (CABOMETYX) 20 MG TABS          DISCHARGE INSTRUCTIONS:   Follow-up with hospice at home Follow-up with interventional radiology at [facility] on Monday morning for Pleurx catheter placement  If you experience worsening of your admission symptoms, develop  shortness of breath, life threatening emergency, suicidal or homicidal thoughts you must seek medical attention immediately by calling 911 or calling your MD immediately  if symptoms less severe.  You Must read complete instructions/literature along with all the possible adverse reactions/side effects for all the Medicines you take and that have been prescribed to you. Take any new Medicines after you have completely understood and accept all the possible adverse reactions/side effects.   Please note  You were cared for by a hospitalist during your hospital stay. If you have any questions about your discharge medications or the care you received while you were in the hospital after you are discharged, you can call the unit and asked to speak with the hospitalist on call if the hospitalist that took care of you is not available. Once you are discharged, your primary care physician will handle any further medical issues. Please note that NO REFILLS for any discharge medications will be authorized once you are discharged, as it is imperative that you return to your primary care physician (or establish a relationship with a primary care physician if you do not have one) for your aftercare needs so that they can reassess your need for medications and monitor your lab values.    Today   CHIEF COMPLAINT:   Chief Complaint  Patient presents with  . Shortness of Breath    HISTORY OF PRESENT ILLNESS:  Hunter Hardin  is a 82 y.o. male with a known history of Pleural effusion presenting with shortness of breath   VITAL SIGNS:  Blood pressure 112/60, pulse 93, temperature 97.7 F (36.5 C), temperature source Oral, resp. rate 20, height '5\' 11"'$  (1.803 m), weight 56.7 kg (125 lb), SpO2 95 %.    PHYSICAL EXAMINATION:  GENERAL:  82 y.o.-year-old patient lying in the bed with no acute distress.  EYES: Pupils equal, round, reactive to light and accommodation. No scleral icterus. Extraocular muscles  intact.  HEENT: Head atraumatic, normocephalic. Oropharynx and nasopharynx clear.  NECK:  Supple, no jugular venous distention. No thyroid enlargement, no tenderness.  LUNGS: Decreased breath sounds left base, no wheezing, rales,rhonchi or crepitation. No use of accessory muscles of respiration.  CARDIOVASCULAR: S1, S2 normal. No murmurs, rubs, or gallops.  ABDOMEN: Soft, non-tender, non-distended. Bowel sounds present. No organomegaly or mass.  EXTREMITIES: No pedal edema, cyanosis, or clubbing.  NEUROLOGIC: Cranial nerves II through XII are intact. Muscle strength 5/5 in all extremities. Sensation intact. Gait not checked.  PSYCHIATRIC: The patient is alert and oriented x 3.  SKIN: No obvious rash, lesion, or ulcer.   DATA REVIEW:   CBC  Recent Labs Lab 12/26/16 0428  WBC 8.2  HGB 12.1*  HCT 35.3*  PLT 180    Chemistries   Recent Labs Lab 12/23/16 0817  12/26/16 0428  NA 135*  < > 137  K 4.1  < > 4.8  CL  --   < >  106  CO2 24  < > 26  GLUCOSE 125  < > 83  BUN 27.4*  < > 32*  CREATININE 1.2  < > 1.04  CALCIUM 8.5  < > 8.4*  AST 16  --   --   ALT 16  --   --   ALKPHOS 75  --   --   BILITOT 0.61  --   --   < > = values in this interval not displayed.    RADIOLOGY:  Dg Chest 1 View  Result Date: 12/26/2016 CLINICAL DATA:  Left-sided thoracentesis. EXAM: CHEST 1 VIEW COMPARISON:  December 25, 2016 FINDINGS: The left-sided hydropneumothorax is again identified with a large effusion and a 2.2 cm apical pneumothorax. No right-sided pneumothorax. No other interval changes. IMPRESSION: Continued left hydropneumothorax, unchanged in the interval. Electronically Signed   By: Dorise Bullion III M.D   On: 12/26/2016 14:08   Dg Chest 2 View  Result Date: 12/25/2016 CLINICAL DATA:  Shortness of breath, history of pleural effusion, lung cancer EXAM: CHEST  2 VIEW COMPARISON:  12/13/2016 FINDINGS: Cardiac size is stable. There is large left pleural effusion increased in size from  prior exam. Worsening consolidation left mid and left lower lung. Small left upper hydropneumothorax again noted. The left apical pneumothorax measures 2.4 cm maximum thickness. IMPRESSION: There is large left pleural effusion increased in size from prior exam. Again noted left upper hydropneumothorax. Left apical pneumothorax measures 2.4 cm maximum thickness. Worsening consolidation in left lower lung. Electronically Signed   By: Lahoma Crocker M.D.   On: 12/25/2016 16:46    Management plans discussed with the patient, and friends in the room with permission to speak in front of them are in agreement.  CODE STATUS:  Code Status History    Date Active Date Inactive Code Status Order ID Comments User Context   12/25/2016  8:29 PM 12/27/2016  3:53 PM Full Code [id number]  Lance Coon, MD Inpatient   12/13/2016 12:35 AM 12/13/2016 10:49 PM Full Code [id number]  Toy Baker, MD Inpatient   10/30/2016  9:33 PM 10/31/2016  6:24 PM Full Code [id number]  Lily Kocher, MD Inpatient    Advance Directive Documentation   Flowsheet Row Most Recent Value  Type of Advance Directive  Healthcare Power of Attorney  Pre-existing out of facility DNR order (yellow form or pink MOST form)  No data  "MOST" Form in Place?  No data      TOTAL TIME TAKING CARE OF THIS PATIENT: 35 minutes.    Loletha Grayer M.D on 12/27/2016 at 4:21 PM  Between 7am to 6pm - Pager - [phone]  After 6pm go to [email or web address] - password [email or web address] [email or web address]  [phone]  CC: Primary care physician; Elsie Stain, MD

## 2016-12-27 NOTE — Progress Notes (Addendum)
Pt reports he feels better and ready to go home. Friend and friend's husband here. PT in with pt ambulating around NS with mild SOB without distress/tolerated well. VSS. Thoracentesis site bandaid dry/intact. Lung sounds unchanged. Oral and written AVS instruction given to pt and significant others with stated understanding with questions answered to their stated satisfaction. Calan prescription given to pt.

## 2016-12-27 NOTE — Evaluation (Signed)
Physical Therapy Evaluation Patient Details Name: Hunter Hardin. MRN: [id number] DOB: 01-05-1935 Today's Date: 12/27/2016   History of Present Illness  Hunter Hardin  is a 82 y.o. male who presents with Increasing shortness of breath due to recurrent left pleural effusion. Patient has had several thoracentesis for this, the effusion recurs due to his left lung cancer. Patient has plans for Pleurx catheter placement on Monday; He is s/p thoracentesis and reports feeling much better;   Clinical Impression  82 yo Male admitted with increased shortness of breath due to recurrent left pleural effusion secondary to lung cancer. Patient is currently under hospice care. Patient reports being mostly mod I for self care ADLs prior to admittance. Currently he is mod I for bed mobility, supervision for transfers and CGA to close supervision during gait tasks. He was able to ambulate 175 feet without AD demonstrating reciprocal gait pattern; Patient demonstrates increased weakness in BLE grossly 4-/5; He reports that he is walking about like he was before he is just a little fatigued. Patient is currently receiving hospice services and is not appropriate for skilled PT intervention at this time. PT addressed all needs at evaluation; He will have some assistance from family upon discharge;     Follow Up Recommendations Supervision - Intermittent (pt on hospice care; will have assistance from family for ADLs; )    Equipment Recommendations  None recommended by PT    Recommendations for Other Services       Precautions / Restrictions Precautions Precautions: Fall Restrictions Weight Bearing Restrictions: No      Mobility  Bed Mobility Overal bed mobility: Modified Independent             General bed mobility comments: uses bed rail to pull self up from supine to sitting edge of bed; flat bed; required min VCs for hand placement;   Transfers Overall transfer level: Needs  assistance Equipment used: None Transfers: Sit to/from Stand Sit to Stand: Supervision         General transfer comment: Patient transferred sit<>Stand from bed with supervision demonstrating good safety awareness with hand placement;   Ambulation/Gait Ambulation/Gait assistance: Min guard Ambulation Distance (Feet): 175 Feet Assistive device: None Gait Pattern/deviations: Step-through pattern;Decreased dorsiflexion - right;Decreased dorsiflexion - left;Trunk flexed Gait velocity: 2.2 feet/sec   General Gait Details: demonstrates reciprocal gait pattern, narrow base of support with shuffled steps;   Stairs            Wheelchair Mobility    Modified Rankin (Stroke Patients Only)       Balance Overall balance assessment: Needs assistance Sitting-balance support: No upper extremity supported;Feet supported Sitting balance-Leahy Scale: Fair Sitting balance - Comments: able to sit with good trunk control; does have rounded back/shoulders;    Standing balance support: No upper extremity supported Standing balance-Leahy Scale: Fair Standing balance comment: static standing balance is good; dynamic standign balane is fair; Patient demonstrates slight sway when walking unsupported but is able to self correct;                              Pertinent Vitals/Pain Pain Assessment: No/denies pain    Home Living Family/patient expects to be discharged to:: Private residence Living Arrangements: Alone (has family that can come stay part time) Available Help at Discharge: Family (sister/cousin) Type of Home: House Home Access: Stairs to enter Entrance Stairs-Rails: Right;Left;Can reach both Entrance Stairs-Number of Steps: 2 Home Layout: Two level;Able  to live on main level with bedroom/bathroom Home Equipment: Kasandra Knudsen - single point;Shower seat Additional Comments: Pt reports walking without AD prior to admittance;     Prior Function Level of Independence:  Independent         Comments: was independent in bathing/dressing and basic self care ADLs; reports walking without AD with no recent falls;      Hand Dominance   Dominant Hand: Left    Extremity/Trunk Assessment   Upper Extremity Assessment Upper Extremity Assessment: Overall WFL for tasks assessed    Lower Extremity Assessment Lower Extremity Assessment: RLE deficits/detail;LLE deficits/detail RLE Deficits / Details: intact light touch sensation; ROM is WFL; gross strength: hip 4/5, ankle 3/5; RLE knee not formally assessed due to wounds on lower leg;  LLE Deficits / Details: intact light touch sensation; ROM is WFL; gross strength: hip 4/5, knee 4-/5, ankle 3/5    Cervical / Trunk Assessment Cervical / Trunk Assessment: Kyphotic  Communication   Communication: No difficulties;HOH  Cognition Arousal/Alertness: Awake/alert Behavior During Therapy: WFL for tasks assessed/performed Overall Cognitive Status: Within Functional Limits for tasks assessed                      General Comments General comments (skin integrity, edema, etc.): has wound on back from thoracentesis;     Exercises     Assessment/Plan    PT Assessment Patent does not need any further PT services  PT Problem List            PT Treatment Interventions      PT Goals (Current goals can be found in the Care Plan section)  Acute Rehab PT Goals Patient Stated Goal: "I want to go home."  PT Goal Formulation: With patient Time For Goal Achievement: 12/27/16 Potential to Achieve Goals: Good    Frequency     Barriers to discharge        Co-evaluation               End of Session Equipment Utilized During Treatment: Gait belt Activity Tolerance: Patient tolerated treatment well Patient left: in chair;with chair alarm set;with family/visitor present;with call bell/phone within reach Nurse Communication: Mobility status    Functional Assessment Tool Used: clinical judgement  39fet walking speed;  Functional Limitation: Mobility: Walking and moving around Mobility: Walking and Moving Around Current Status [phone]: At least 1 percent but less than 20 percent impaired, limited or restricted Mobility: Walking and Moving Around Goal Status [phone]: At least 1 percent but less than 20 percent impaired, limited or restricted Mobility: Walking and Moving Around Discharge Status [phone]: At least 1 percent but less than 20 percent impaired, limited or restricted    Time: 0900-0926 PT Time Calculation (min) (ACUTE ONLY): 26 min   Charges:   PT Evaluation $PT Eval Low Complexity: 1 Procedure     PT G Codes:   PT G-Codes **NOT FOR INPATIENT CLASS** Functional Assessment Tool Used: clinical judgement 139ft walking speed;  Functional Limitation: Mobility: Walking and moving around Mobility: Walking and Moving Around Current Status ([phone] At least 1 percent but less than 20 percent impaired, limited or restricted Mobility: Walking and Moving Around Goal Status ([phone] At least 1 percent but less than 20 percent impaired, limited or restricted Mobility: Walking and Moving Around Discharge Status ([phone] At least 1 percent but less than 20 percent impaired, limited or restricted    Trotter,Margaret PT, DPT 12/27/2016, 10:26 AM

## 2016-12-27 NOTE — Discharge Instructions (Signed)
Pleural Effusion  A pleural effusion is an abnormal buildup of fluid in the layers of tissue between your lungs and the inside of your chest (pleural space). These two layers of tissue that line both your lungs and the inside of your chest are called pleura. Usually, there is no air in the space between the pleura, only a thin layer of fluid. If left untreated, a large amount of fluid can build up and cause the lung to collapse. A pleural effusion is usually caused by another disease that requires treatment.  The two main types of pleural effusion are:  · Transudative pleural effusion. This happens when fluid leaks into the pleural space because of a low protein count in your blood or high blood pressure in your vessels. Heart failure often causes this.  · Exudative infusion. This occurs when fluid collects in the pleural space from blocked blood vessels or lymph vessels. Some lung diseases, injuries, and cancers can cause this type of effusion.    What are the causes?  Pleural effusion can be caused by:  · Heart failure.  · A blood clot in the lung (pulmonary embolism).  · Pneumonia.  · Cancer.  · Liver failure (cirrhosis).  · Kidney disease.  · Complications from surgery, such as from open heart surgery.    What are the signs or symptoms?  In some cases, pleural effusion may cause no symptoms. Symptoms can include:  · Shortness of breath, especially when lying down.  · Chest pain, often worse when taking a deep breath.  · Fever.  · Dry cough that is lasting (chronic).  · Hiccups.  · Rapid breathing.    An underlying condition that is causing the pleural effusion (such as heart failure, pneumonia, blood clots, tuberculosis, or cancer) may also cause additional symptoms.  How is this diagnosed?  Your health care provider may suspect pleural effusion based on your symptoms and medical history. Your health care provider will also do a physical exam and a chest X-ray. If the X-ray shows there is fluid in your chest,  you may need to have this fluid removed using a needle (thoracentesis) so it can be tested.  You may also have:  · Imaging studies of the chest, such as:  ? Ultrasound.  ? CT scan.  · Blood tests for kidney and liver function.    How is this treated?  Treatment depends on the cause of the pleural effusion. Treatment may include:  · Taking antibiotic medicines to clear up an infection that is causing the pleural effusion.  · Placing a tube in the chest to drain the effusion (tube thoracostomy). This procedure is often used when there is an infection in the fluid.  · Surgery to remove the fibrous outer layer of tissue from the pleural space (decortication).  · Thoracentesis, which can improve cough and shortness of breath.  · A procedure to put medicine into the chest cavity to seal the pleural space to prevent fluid buildup (pleurodesis).  · Chemotherapy and radiation therapy. These may be required in the case of cancerous (malignant) pleural effusion.    Follow these instructions at home:  · Take medicines only as directed by your health care provider.  · Keep track of how long you can gently exercise before you get short of breath. Try simply walking at first.  · Do not use any tobacco products, including cigarettes, chewing tobacco, or electronic cigarettes. If you need help quitting, ask your health care provider.  ·   Keep all follow-up visits as directed by your health care provider. This is important.  Contact a health care provider if:  · The amount of time that you are able to exercise decreases or does not improve with time.  · You have pain or signs of infection at the puncture site if you had thoracentesis. Watch for:  ? Drainage.  ? Redness.  ? Swelling.  · You have a fever.  Get help right away if:  · You are short of breath.  · You develop chest pain.  · You develop a new cough.  This information is not intended to replace advice given to you by your health care provider. Make sure you discuss any  questions you have with your health care provider.  Document Released: 12/07/2005 Document Revised: 05/11/2016 Document Reviewed: 05/02/2014  Elsevier Interactive Patient Education © 2017 [employer]

## 2016-12-28 ENCOUNTER — Ambulatory Visit (HOSPITAL_COMMUNITY)
Admit: 2016-12-28 | Discharge: 2016-12-28 | Disposition: A | Discharge status: Home / Self Care | Source: Ambulatory Visit | Attending: Oncology | Admitting: Oncology

## 2016-12-28 ENCOUNTER — Encounter (HOSPITAL_COMMUNITY): Payer: Self-pay

## 2016-12-28 ENCOUNTER — Ambulatory Visit (HOSPITAL_COMMUNITY)
Admit: 2016-12-28 | Discharge: 2016-12-28 | Disposition: A | Discharge status: Home / Self Care | Attending: Oncology | Admitting: Oncology

## 2016-12-28 DIAGNOSIS — R634 Abnormal weight loss: Secondary | ICD-10-CM | POA: Diagnosis not present

## 2016-12-28 DIAGNOSIS — J910 Malignant pleural effusion: Secondary | ICD-10-CM | POA: Diagnosis not present

## 2016-12-28 DIAGNOSIS — Z85528 Personal history of other malignant neoplasm of kidney: Secondary | ICD-10-CM | POA: Diagnosis not present

## 2016-12-28 DIAGNOSIS — I10 Essential (primary) hypertension: Secondary | ICD-10-CM | POA: Insufficient documentation

## 2016-12-28 DIAGNOSIS — E785 Hyperlipidemia, unspecified: Secondary | ICD-10-CM | POA: Insufficient documentation

## 2016-12-28 DIAGNOSIS — C7800 Secondary malignant neoplasm of unspecified lung: Secondary | ICD-10-CM | POA: Insufficient documentation

## 2016-12-28 DIAGNOSIS — C649 Malignant neoplasm of unspecified kidney, except renal pelvis: Secondary | ICD-10-CM | POA: Diagnosis not present

## 2016-12-28 DIAGNOSIS — N401 Enlarged prostate with lower urinary tract symptoms: Secondary | ICD-10-CM | POA: Diagnosis not present

## 2016-12-28 DIAGNOSIS — K219 Gastro-esophageal reflux disease without esophagitis: Secondary | ICD-10-CM | POA: Diagnosis not present

## 2016-12-28 DIAGNOSIS — R1310 Dysphagia, unspecified: Secondary | ICD-10-CM | POA: Diagnosis not present

## 2016-12-28 DIAGNOSIS — E039 Hypothyroidism, unspecified: Secondary | ICD-10-CM | POA: Diagnosis not present

## 2016-12-28 DIAGNOSIS — Z85038 Personal history of other malignant neoplasm of large intestine: Secondary | ICD-10-CM | POA: Diagnosis not present

## 2016-12-28 HISTORY — DX: Unspecified open wound, unspecified ankle, initial encounter: S91.009A

## 2016-12-28 HISTORY — DX: Presence of urogenital implants: Z96.0

## 2016-12-28 HISTORY — DX: Presence of other specified devices: Z97.8

## 2016-12-28 HISTORY — PX: IR GENERIC HISTORICAL: IMG1180011

## 2016-12-28 LAB — BASIC METABOLIC PANEL
ANION GAP: 6 (ref 5–15)
BUN: 35 mg/dL — AB (ref 6–20)
CALCIUM: 8.5 mg/dL — AB (ref 8.9–10.3)
CO2: 26 mmol/L (ref 22–32)
CREATININE: 1.18 mg/dL (ref 0.61–1.24)
Chloride: 103 mmol/L (ref 101–111)
GFR calc Af Amer: >60 mL/min (ref >60)
GFR, EST NON AFRICAN AMERICAN: 57 mL/min — AB (ref >60)
GLUCOSE: 86 mg/dL (ref 65–99)
Potassium: 5.3 mmol/L — ABNORMAL HIGH (ref 3.5–5.1)
Sodium: 135 mmol/L (ref 135–145)

## 2016-12-28 LAB — CBC WITH DIFFERENTIAL/PLATELET
BASOS ABS: 0 10*3/uL (ref 0.0–0.1)
BASOS PCT: 0 %
EOS ABS: 0.1 10*3/uL (ref 0.0–0.7)
EOS PCT: 1 %
HCT: 35.3 % — ABNORMAL LOW (ref 39.0–52.0)
Hemoglobin: 11.6 g/dL — ABNORMAL LOW (ref 13.0–17.0)
Lymphocytes Relative: 24 %
Lymphs Abs: 1.9 10*3/uL (ref 0.7–4.0)
MCH: 33.6 pg (ref 26.0–34.0)
MCHC: 32.9 g/dL (ref 30.0–36.0)
MCV: 102.3 fL — ABNORMAL HIGH (ref 78.0–100.0)
MONO ABS: 0.7 10*3/uL (ref 0.1–1.0)
Monocytes Relative: 9 %
NEUTROS ABS: 5.3 10*3/uL (ref 1.7–7.7)
Neutrophils Relative %: 66 %
Platelets: 157 10*3/uL (ref 150–400)
RBC: 3.45 MIL/uL — ABNORMAL LOW (ref 4.22–5.81)
RDW: 16.1 % — AB (ref 11.5–15.5)
WBC: 8 10*3/uL (ref 4.0–10.5)

## 2016-12-28 LAB — PROTIME-INR
INR: 1.17
Prothrombin Time: 15 seconds (ref 11.4–15.2)

## 2016-12-28 LAB — APTT: APTT: 29 s (ref 24–36)

## 2016-12-28 MED ORDER — FLUMAZENIL 0.5 MG/5ML IV SOLN
INTRAVENOUS | Status: AC
  Filled 2016-12-28: qty 5

## 2016-12-28 MED ORDER — MIDAZOLAM HCL 2 MG/2ML IJ SOLN
INTRAMUSCULAR | Status: AC
  Filled 2016-12-28: qty 4

## 2016-12-28 MED ORDER — NALOXONE HCL 0.4 MG/ML IJ SOLN
INTRAMUSCULAR | Status: AC
  Filled 2016-12-28: qty 1

## 2016-12-28 MED ORDER — LIDOCAINE-EPINEPHRINE (PF) 2 %-1:200000 IJ SOLN
INTRAMUSCULAR | Status: AC | PRN
  Administered 2016-12-28: 10 mL via INTRADERMAL

## 2016-12-28 MED ORDER — FENTANYL CITRATE (PF) 100 MCG/2ML IJ SOLN
INTRAMUSCULAR | Status: AC | PRN
  Administered 2016-12-28: 50 ug via INTRAVENOUS

## 2016-12-28 MED ORDER — CEFAZOLIN SODIUM-DEXTROSE 2-4 GM/100ML-% IV SOLN
2.0000 g | INTRAVENOUS | Status: AC
  Administered 2016-12-28: 2 g via INTRAVENOUS
  Filled 2016-12-28 (×2): qty 100

## 2016-12-28 MED ORDER — SODIUM CHLORIDE 0.9 % IV SOLN
INTRAVENOUS | Status: DC
  Administered 2016-12-28: 10:00:00 via INTRAVENOUS

## 2016-12-28 MED ORDER — MIDAZOLAM HCL 2 MG/2ML IJ SOLN
INTRAMUSCULAR | Status: AC | PRN
  Administered 2016-12-28: 1 mg via INTRAVENOUS

## 2016-12-28 MED ORDER — FENTANYL CITRATE (PF) 100 MCG/2ML IJ SOLN
INTRAMUSCULAR | Status: AC
  Filled 2016-12-28: qty 4

## 2016-12-28 MED ORDER — LIDOCAINE-EPINEPHRINE (PF) 2 %-1:200000 IJ SOLN
INTRAMUSCULAR | Status: AC
  Filled 2016-12-28: qty 20

## 2016-12-28 NOTE — Progress Notes (Signed)
Pt had placement of pleurx catheter today.  Pt is under Hospice care and they will be assisting with pleurx care.  Family and pt watched the pleurx catheter education video before the procedure.

## 2016-12-28 NOTE — Discharge Instructions (Signed)
Pleural Effusion  A pleural effusion is an abnormal buildup of fluid in the layers of tissue between your lungs and the inside of your chest (pleural space). These two layers of tissue that line both your lungs and the inside of your chest are called pleura. Usually, there is no air in the space between the pleura, only a thin layer of fluid. If left untreated, a large amount of fluid can build up and cause the lung to collapse. A pleural effusion is usually caused by another disease that requires treatment.  The two main types of pleural effusion are:  · Transudative pleural effusion. This happens when fluid leaks into the pleural space because of a low protein count in your blood or high blood pressure in your vessels. Heart failure often causes this.  · Exudative infusion. This occurs when fluid collects in the pleural space from blocked blood vessels or lymph vessels. Some lung diseases, injuries, and cancers can cause this type of effusion.    What are the causes?  Pleural effusion can be caused by:  · Heart failure.  · A blood clot in the lung (pulmonary embolism).  · Pneumonia.  · Cancer.  · Liver failure (cirrhosis).  · Kidney disease.  · Complications from surgery, such as from open heart surgery.    What are the signs or symptoms?  In some cases, pleural effusion may cause no symptoms. Symptoms can include:  · Shortness of breath, especially when lying down.  · Chest pain, often worse when taking a deep breath.  · Fever.  · Dry cough that is lasting (chronic).  · Hiccups.  · Rapid breathing.    An underlying condition that is causing the pleural effusion (such as heart failure, pneumonia, blood clots, tuberculosis, or cancer) may also cause additional symptoms.  How is this diagnosed?  Your health care provider may suspect pleural effusion based on your symptoms and medical history. Your health care provider will also do a physical exam and a chest X-ray. If the X-ray shows there is fluid in your chest,  you may need to have this fluid removed using a needle (thoracentesis) so it can be tested.  You may also have:  · Imaging studies of the chest, such as:  ? Ultrasound.  ? CT scan.  · Blood tests for kidney and liver function.    How is this treated?  Treatment depends on the cause of the pleural effusion. Treatment may include:  · Taking antibiotic medicines to clear up an infection that is causing the pleural effusion.  · Placing a tube in the chest to drain the effusion (tube thoracostomy). This procedure is often used when there is an infection in the fluid.  · Surgery to remove the fibrous outer layer of tissue from the pleural space (decortication).  · Thoracentesis, which can improve cough and shortness of breath.  · A procedure to put medicine into the chest cavity to seal the pleural space to prevent fluid buildup (pleurodesis).  · Chemotherapy and radiation therapy. These may be required in the case of cancerous (malignant) pleural effusion.    Follow these instructions at home:  · Take medicines only as directed by your health care provider.  · Keep track of how long you can gently exercise before you get short of breath. Try simply walking at first.  · Do not use any tobacco products, including cigarettes, chewing tobacco, or electronic cigarettes. If you need help quitting, ask your health care provider.  ·   you had thoracentesis. Watch for:  Drainage.  Redness.  Swelling.  You have a fever. Get help right away if:  You are short of breath.  You develop chest pain.  You develop a new cough. This information is not intended to replace advice given to you by your health care provider. Make sure you discuss any questions you have  with your health care provider. Document Released: 12/07/2005 Document Revised: 05/11/2016 Document Reviewed: 05/02/2014 Elsevier Interactive Patient Education  2017 [place]. Moderate Conscious Sedation, Adult, Care After These instructions provide you with information about caring for yourself after your procedure. Your health care provider may also give you more specific instructions. Your treatment has been planned according to current medical practices, but problems sometimes occur. Call your health care provider if you have any problems or questions after your procedure. What can I expect after the procedure? After your procedure, it is common:  To feel sleepy for several hours.  To feel clumsy and have poor balance for several hours.  To have poor judgment for several hours.  To vomit if you eat too soon. Follow these instructions at home: For at least 24 hours after the procedure:   Do not:  Participate in activities where you could fall or become injured.  Drive.  Use heavy machinery.  Drink alcohol.  Take sleeping pills or medicines that cause drowsiness.  Make important decisions or sign legal documents.  Take care of children on your own.  Rest. Eating and drinking  Follow the diet recommended by your health care provider.  If you vomit:  Drink water, juice, or soup when you can drink without vomiting.  Make sure you have little or no nausea before eating solid foods. General instructions  Have a responsible adult stay with you until you are awake and alert.  Take over-the-counter and prescription medicines only as told by your health care provider.  If you smoke, do not smoke without supervision.  Keep all follow-up visits as told by your health care provider. This is important. Contact a health care provider if:  You keep feeling nauseous or you keep vomiting.  You feel light-headed.  You develop a rash.  You have a fever. Get help  right away if:  You have trouble breathing. This information is not intended to replace advice given to you by your health care provider. Make sure you discuss any questions you have with your health care provider. Document Released: 09/27/2013 Document Revised: 05/11/2016 Document Reviewed: 03/28/2016 Elsevier Interactive Patient Education  2017 [employer].

## 2016-12-28 NOTE — H&P (Signed)
Referring Physician(s): [name]  Supervising Physician: Sandi Mariscal  Patient Status:  [facility]  Chief Complaint:  Recurrent symptomatic left pleural effusion  Subjective: Patient familiar to IR service from multiple left thoracenteses in the past as well as left lung lesion biopsy in 2014. He has a known history of stage IV metastatic renal cell carcinoma with recurrent symptomatic left pleural effusion.His last thoracentesis was performed at [facility] this past weekend removing 1.2 L. Past medical history also significant for colon cancer 2014.He has a known left hydropneumothorax and is currently under hospice care. He presents today for tunneled  left Pleurx catheter placement. He currently denies fever, headache, chest pain, abdominal pain, nausea, vomiting or abnormal bleeding. He has had weight loss,dyspnea, occasional cough, occasional left flank discomfort. Additional history as below.  Past Medical History:  Diagnosis Date  . Colon cancer ([place]) dx'd 01/2013  . Elevated PSA    H/O  . Foley catheter in place   . GERD (gastroesophageal reflux disease)   . HLD (hyperlipidemia)   . Hypertension   . Hypothyroidism   . Metastasis to lung ([place]) dx'd 01/2013  . Pleural effusion   . Protein calorie malnutrition ([place]) 06/13/2013  . renal ca dx'd 01/2013  . Wound of ankle    per right leg / wrapped in coban   Past Surgical History:  Procedure Laterality Date  . CHOLECYSTECTOMY  early 67's  . COLONOSCOPY  01/2013  . LAPAROSCOPIC PARTIAL COLECTOMY N/A 03/06/2013   Procedure: LAPAROSCOPic assisted right hemi COLECTOMY;  Surgeon: Leighton Ruff, MD;  Location: [facility];  Service: General;  Laterality: N/A;  . THORACENTESIS    . VASECTOMY       Allergies: Patient has no known allergies.  Medications: Prior to Admission medications   Medication Sig Start Date End Date Taking? Authorizing Provider  doxazosin (CARDURA) 8 MG tablet TAKE 1 TABLET DAILY 06/15/16   Yes Tonia Ghent, MD  finasteride (PROSCAR) 5 MG tablet Take 5 mg by mouth daily. 01/09/16  Yes Historical Provider, MD  KLOR-CON M20 20 MEQ tablet TAKE 1 TABLET TWICE A DAY 08/17/16  Yes Tonia Ghent, MD  lactose free nutrition (BOOST) LIQD Take 237 mLs by mouth daily.   Yes Historical Provider, MD  levothyroxine (SYNTHROID, LEVOTHROID) 50 MCG tablet TAKE 2 TABLETS ON SUNDAY AND 1 TABLET ON ALL OTHER DAYS (8 TABLETS A WEEK TOTAL) 12/17/16  Yes Tonia Ghent, MD  loperamide (IMODIUM A-D) 2 MG tablet Take 2 mg by mouth as needed for diarrhea or loose stools.   Yes Historical Provider, MD  magnesium chloride (SLOW-MAG) 64 MG TBEC SR tablet Take 2 tablets (128 mg total) by mouth daily. 12/13/16  Yes Eber Jones, MD  Omega-3 Fatty Acids (FISH OIL OMEGA-3 PO) Take 1 g by mouth 2 (two) times daily.   Yes Historical Provider, MD  ondansetron (ZOFRAN) 8 MG tablet Take 1 tablet (8 mg total) by mouth every 8 (eight) hours as needed. for nausea 02/26/16  Yes Tonia Ghent, MD  propranolol (INDERAL) 40 MG tablet Take 0.5 tablets (20 mg total) by mouth 2 (two) times daily. 06/05/15  Yes Tonia Ghent, MD  traMADol (ULTRAM) 50 MG tablet Take 1 tablet (50 mg total) by mouth every 8 (eight) hours as needed. 12/04/16  Yes Tonia Ghent, MD  verapamil (CALAN-SR) 120 MG CR tablet Take 1 tablet (120 mg total) by mouth daily. 12/28/16  Yes Loletha Grayer, MD  vitamin C (  ASCORBIC ACID) 500 MG tablet Take 500 mg by mouth daily.   Yes Historical Provider, MD  furosemide (LASIX) 20 MG tablet Take 1 tablet (20 mg total) by mouth daily as needed for fluid. 12/01/16   Tonia Ghent, MD     Vital Signs: BP 116/70 (BP Location: Right Arm)   Pulse 79   Temp 97.7 F (36.5 C) (Oral)   Resp 18   SpO2 93%   Physical Exam Awake, alert. Chest with diminished breath sounds left mid and lower lung zones, few right basilar crackles. Heart with regular rate and rhythm. Abdomen soft, positive bowel sounds,  nontender. Left lower extremity with no edema, right lower extremity bandaged secondary to wound.  Imaging: Dg Chest 1 View  Result Date: 12/26/2016 CLINICAL DATA:  Left-sided thoracentesis. EXAM: CHEST 1 VIEW COMPARISON:  December 25, 2016 FINDINGS: The left-sided hydropneumothorax is again identified with a large effusion and a 2.2 cm apical pneumothorax. No right-sided pneumothorax. No other interval changes. IMPRESSION: Continued left hydropneumothorax, unchanged in the interval. Electronically Signed   By: Dorise Bullion III M.D   On: 12/26/2016 14:08   Dg Chest 2 View  Result Date: 12/25/2016 CLINICAL DATA:  Shortness of breath, history of pleural effusion, lung cancer EXAM: CHEST  2 VIEW COMPARISON:  12/13/2016 FINDINGS: Cardiac size is stable. There is large left pleural effusion increased in size from prior exam. Worsening consolidation left mid and left lower lung. Small left upper hydropneumothorax again noted. The left apical pneumothorax measures 2.4 cm maximum thickness. IMPRESSION: There is large left pleural effusion increased in size from prior exam. Again noted left upper hydropneumothorax. Left apical pneumothorax measures 2.4 cm maximum thickness. Worsening consolidation in left lower lung. Electronically Signed   By: Lahoma Crocker M.D.   On: 12/25/2016 16:46    Labs:  CBC:  Recent Labs  12/23/16 0817 12/25/16 1716 12/26/16 0428 12/28/16 1010  WBC 6.5 8.9 8.2 8.0  HGB 12.7* 12.6* 12.1* 11.6*  HCT 38.3* 37.3* 35.3* 35.3*  PLT 116* 188 180 157    COAGS:  Recent Labs  10/30/16 1701 12/25/16 1716  INR 1.10 1.05  APTT  --  31    BMP:  Recent Labs  12/13/16 0110 12/23/16 0817 12/25/16 1716 12/26/16 0428 12/28/16 1010  NA 137 135* 132* 137 135  K 4.7 4.1 5.2* 4.8 5.3*  CL 103  --  101 106 103  CO2 '30 24 27 26 26  '$ GLUCOSE 86 125 85 83 86  BUN 23* 27.4* 32* 32* 35*  CALCIUM 8.2* 8.5 8.7* 8.4* 8.5*  CREATININE 1.18 1.2 1.26* 1.04 1.18  GFRNONAA 57*  --  52*  >60 57*  GFRAA >60  --  >60 >60 >60    LIVER FUNCTION TESTS:  Recent Labs  10/30/16 1701 11/24/16 0900 12/13/16 0110 12/23/16 0817  BILITOT 0.6 0.81 1.0 0.61  AST '27 22 21 16  '$ ALT '28 21 21 16  '$ ALKPHOS 69 83 76 75  PROT 6.3* 6.1* 6.1* 5.9*  ALBUMIN 2.8* 2.3* 2.7* 2.2*    Assessment and Plan:  Pt with known history of stage IV metastatic renal cell carcinoma with recurrent symptomatic left pleural effusion.His last thoracentesis was performed at [facility] this past weekend removing 1.2 L. Past medical history also significant for colon cancer 2014.He has a known left hydropneumothorax and is currently under hospice care. He presents today for tunneled  left Pleurx catheter placement. Details/risks of procedure, including but not limited to, internal bleeding,  infection, injury to adjacent structures, discussed with patient and family with their understanding and consent.   Electronically Signed: D. Rowe Robert 12/28/2016, 10:44 AM   I spent a total of 20 minutes at the the patient's bedside AND on the patient's hospital floor or unit, greater than 50% of which was counseling/coordinating care for left Pleurx catheter placement

## 2016-12-28 NOTE — Progress Notes (Signed)
LVMM with Dr Hazeline Junker nurse in regards to need for hospice to received specific pleurx instructions.

## 2016-12-28 NOTE — Procedures (Signed)
Successful placement of a left sided pleural drainage catheter. EBL: None No immediate complications.  Ronny Bacon, MD Pager #: [phone]

## 2016-12-29 DIAGNOSIS — C649 Malignant neoplasm of unspecified kidney, except renal pelvis: Secondary | ICD-10-CM | POA: Diagnosis not present

## 2016-12-29 DIAGNOSIS — R1310 Dysphagia, unspecified: Secondary | ICD-10-CM | POA: Diagnosis not present

## 2016-12-29 DIAGNOSIS — C7800 Secondary malignant neoplasm of unspecified lung: Secondary | ICD-10-CM | POA: Diagnosis not present

## 2016-12-29 DIAGNOSIS — J910 Malignant pleural effusion: Secondary | ICD-10-CM | POA: Diagnosis not present

## 2016-12-29 DIAGNOSIS — R634 Abnormal weight loss: Secondary | ICD-10-CM | POA: Diagnosis not present

## 2016-12-29 DIAGNOSIS — N401 Enlarged prostate with lower urinary tract symptoms: Secondary | ICD-10-CM | POA: Diagnosis not present

## 2016-12-29 NOTE — Telephone Encounter (Signed)
I don't recall giving an order re: amount to drain or frequency to drain.   Unless there is another order from another doc, then I would draw 1L every 2-3 days if patient is SOB.  Update [place] if that is not sufficient to help with SOB.   Thanks.

## 2016-12-29 NOTE — Telephone Encounter (Signed)
Left detailed message on voicemail.   Please verify that message was received.

## 2016-12-29 NOTE — Telephone Encounter (Addendum)
Robin nurse with [facility] left v/m requesting to clarify orders for thoracentesis; pt had catheter inserted 12/29/15 to have drainage done. Shirlean Mylar said received order from Dr Damita Dunnings to drain every other day and prn. Shirlean Mylar said usually in order gives appropriate amt to drain;such has drain no more than 1 liter or 2 liters or whatever parameters Dr Damita Dunnings wants drained at a time. Robin request parameters of how much to drain. When in hospital drained 3 L and systolic dropped below 80. Verapamil dosage decreased from 240 mg to 120 mg a day.Shirlean Mylar wanted Dr Damita Dunnings to be aware of dosage change and request cb with parameters for drainage.

## 2016-12-30 DIAGNOSIS — R634 Abnormal weight loss: Secondary | ICD-10-CM | POA: Diagnosis not present

## 2016-12-30 DIAGNOSIS — J910 Malignant pleural effusion: Secondary | ICD-10-CM | POA: Diagnosis not present

## 2016-12-30 DIAGNOSIS — N401 Enlarged prostate with lower urinary tract symptoms: Secondary | ICD-10-CM | POA: Diagnosis not present

## 2016-12-30 DIAGNOSIS — C7800 Secondary malignant neoplasm of unspecified lung: Secondary | ICD-10-CM | POA: Diagnosis not present

## 2016-12-30 DIAGNOSIS — C649 Malignant neoplasm of unspecified kidney, except renal pelvis: Secondary | ICD-10-CM | POA: Diagnosis not present

## 2016-12-30 DIAGNOSIS — R1310 Dysphagia, unspecified: Secondary | ICD-10-CM | POA: Diagnosis not present

## 2016-12-31 ENCOUNTER — Encounter: Admitting: Surgery

## 2016-12-31 DIAGNOSIS — R634 Abnormal weight loss: Secondary | ICD-10-CM | POA: Diagnosis not present

## 2016-12-31 DIAGNOSIS — J910 Malignant pleural effusion: Secondary | ICD-10-CM | POA: Diagnosis not present

## 2016-12-31 DIAGNOSIS — L97211 Non-pressure chronic ulcer of right calf limited to breakdown of skin: Secondary | ICD-10-CM | POA: Diagnosis not present

## 2016-12-31 DIAGNOSIS — L97811 Non-pressure chronic ulcer of other part of right lower leg limited to breakdown of skin: Secondary | ICD-10-CM | POA: Diagnosis not present

## 2016-12-31 DIAGNOSIS — R1310 Dysphagia, unspecified: Secondary | ICD-10-CM | POA: Diagnosis not present

## 2016-12-31 DIAGNOSIS — C649 Malignant neoplasm of unspecified kidney, except renal pelvis: Secondary | ICD-10-CM | POA: Diagnosis not present

## 2016-12-31 DIAGNOSIS — C7800 Secondary malignant neoplasm of unspecified lung: Secondary | ICD-10-CM | POA: Diagnosis not present

## 2016-12-31 DIAGNOSIS — N401 Enlarged prostate with lower urinary tract symptoms: Secondary | ICD-10-CM | POA: Diagnosis not present

## 2016-12-31 NOTE — Telephone Encounter (Signed)
Robin with Hospice verified that she received the instructions and they have been implemented with success.

## 2017-01-01 ENCOUNTER — Encounter: Payer: Self-pay | Admitting: *Deleted

## 2017-01-01 ENCOUNTER — Encounter: Payer: Self-pay | Admitting: Family Medicine

## 2017-01-01 ENCOUNTER — Ambulatory Visit (INDEPENDENT_AMBULATORY_CARE_PROVIDER_SITE_OTHER): Payer: Medicare Other | Admitting: Family Medicine

## 2017-01-01 DIAGNOSIS — H6122 Impacted cerumen, left ear: Secondary | ICD-10-CM

## 2017-01-01 DIAGNOSIS — C649 Malignant neoplasm of unspecified kidney, except renal pelvis: Secondary | ICD-10-CM | POA: Diagnosis not present

## 2017-01-01 DIAGNOSIS — J90 Pleural effusion, not elsewhere classified: Secondary | ICD-10-CM

## 2017-01-01 MED ORDER — POTASSIUM CHLORIDE 20 MEQ/15ML (10%) PO SOLN: 20.0000 meq | Freq: Two times a day (BID) | ORAL | 1 refills | Status: DC

## 2017-01-01 NOTE — Progress Notes (Signed)
Hunter Hardin ([id number]) Visit Report for 12/31/2016 Arrival Information Details Patient Name: Hunter Hardin, Hunter Hardin. Date of Service: 12/31/2016 10:45 AM Medical Record Number: [id number] Patient Account Number: [id number] Date of Birth/Sex: June 15, 1935 (82 y.o. Male) Treating RN: Montey Hora Primary Care Physician: Elsie Stain Other Clinician: Referring Physician: Elsie Stain Treating Physician/Extender: Frann Rider in Treatment: 5 Visit Information History Since Last Visit All ordered tests and consults were completed: No Patient Arrived: Wheel Chair Added or deleted any medications: No Arrival Time: 10:37 Any new allergies or adverse reactions: No Accompanied By: daughter and niece Had a fall or experienced change in No activities of daily living that may affect Transfer Assistance: EasyPivot risk of falls: Patient Lift Signs or symptoms of abuse/neglect since last No Patient Identification Verified: Yes visito Secondary Verification Process Yes Hospitalized since last visit: No Completed: Has Dressing in Place as Prescribed: Yes Patient Requires Transmission- No Based Precautions: Has Compression in Place as Prescribed: Yes Patient Has Alerts: Yes Pain Present Now: Yes Electronic Signature(s) Signed: 12/31/2016 5:34:24 PM By: Montey Hora Entered By: Montey Hora on 12/31/2016 10:39:29 Hunter Hardin ([id number]) -------------------------------------------------------------------------------- Clinic Level of Care Assessment Details Patient Name: Hunter Hardin. Date of Service: 12/31/2016 10:45 AM Medical Record Number: [id number] Patient Account Number: [id number] Date of Birth/Sex: 11-17-1935 (82 y.o. Male) Treating RN: Ahmed Prima Primary Care Physician: Elsie Stain Other Clinician: Referring Physician: Elsie Stain Treating Physician/Extender: Frann Rider in Treatment: 5 Clinic Level of Care Assessment Items TOOL 4  Quantity Score X - Use when only an EandM is performed on FOLLOW-UP visit 1 0 ASSESSMENTS - Nursing Assessment / Reassessment X - Reassessment of Co-morbidities (includes updates in patient status) 1 10 X - Reassessment of Adherence to Treatment Plan 1 5 ASSESSMENTS - Wound and Skin Assessment / Reassessment X - Simple Wound Assessment / Reassessment - one wound 1 5 '[]'$  - Complex Wound Assessment / Reassessment - multiple wounds 0 '[]'$  - Dermatologic / Skin Assessment (not related to wound area) 0 ASSESSMENTS - Focused Assessment '[]'$  - Circumferential Edema Measurements - multi extremities 0 '[]'$  - Nutritional Assessment / Counseling / Intervention 0 '[]'$  - Lower Extremity Assessment (monofilament, tuning fork, pulses) 0 '[]'$  - Peripheral Arterial Disease Assessment (using hand held doppler) 0 ASSESSMENTS - Ostomy and/or Continence Assessment and Care '[]'$  - Incontinence Assessment and Management 0 '[]'$  - Ostomy Care Assessment and Management (repouching, etc.) 0 PROCESS - Coordination of Care '[]'$  - Simple Patient / Family Education for ongoing care 0 X - Complex (extensive) Patient / Family Education for ongoing care 1 20 X - Staff obtains Programmer, systems, Records, Test Results / Process Orders 1 10 X - Staff telephones HHA, Nursing Homes / Clarify orders / etc 1 10 '[]'$  - Routine Transfer to another Facility (non-emergent condition) 0 NAZIRE, FRUTH ([id number]) '[]'$  - Routine Hospital Admission (non-emergent condition) 0 '[]'$  - New Admissions / Biomedical engineer / Ordering NPWT, Apligraf, etc. 0 '[]'$  - Emergency Hospital Admission (emergent condition) 0 X - Simple Discharge Coordination 1 10 '[]'$  - Complex (extensive) Discharge Coordination 0 PROCESS - Special Needs '[]'$  - Pediatric / Minor Patient Management 0 '[]'$  - Isolation Patient Management 0 '[]'$  - Hearing / Language / Visual special needs 0 '[]'$  - Assessment of Community assistance (transportation, D/C planning, etc.) 0 '[]'$  - Additional assistance /  Altered mentation 0 '[]'$  - Support Surface(s) Assessment (bed, cushion, seat, etc.) 0 INTERVENTIONS - Wound Cleansing / Measurement X - Simple Wound Cleansing - one wound 1 5 '[]'$  -  Complex Wound Cleansing - multiple wounds 0 '[]'$  - Wound Imaging (photographs - any number of wounds) 0 '[]'$  - Wound Tracing (instead of photographs) 0 X - Simple Wound Measurement - one wound 1 5 '[]'$  - Complex Wound Measurement - multiple wounds 0 INTERVENTIONS - Wound Dressings X - Small Wound Dressing one or multiple wounds 1 10 '[]'$  - Medium Wound Dressing one or multiple wounds 0 '[]'$  - Large Wound Dressing one or multiple wounds 0 X - Application of Medications - topical 1 5 '[]'$  - Application of Medications - injection 0 INTERVENTIONS - Miscellaneous '[]'$  - External ear exam 0 FINNIAN, HUSTED ([id number]) '[]'$  - Specimen Collection (cultures, biopsies, blood, body fluids, etc.) 0 '[]'$  - Specimen(s) / Culture(s) sent or taken to Lab for analysis 0 '[]'$  - Patient Transfer (multiple staff / Harrel Lemon Lift / Similar devices) 0 '[]'$  - Simple Staple / Suture removal (25 or less) 0 '[]'$  - Complex Staple / Suture removal (26 or more) 0 '[]'$  - Hypo / Hyperglycemic Management (close monitor of Blood Glucose) 0 '[]'$  - Ankle / Brachial Index (ABI) - do not check if billed separately 0 X - Vital Signs 1 5 Has the patient been seen at the hospital within the last three years: Yes Total Score: 100 Level Of Care: New/Established - Level 3 Electronic Signature(s) Signed: 12/31/2016 5:33:09 PM By: Alric Quan Entered By: Alric Quan on 12/31/2016 11:42:16 Hunter Hardin ([id number]) -------------------------------------------------------------------------------- Encounter Discharge Information Details Patient Name: Hunter Hardin. Date of Service: 12/31/2016 10:45 AM Medical Record Number: [id number] Patient Account Number: [id number] Date of Birth/Sex: 04-22-1935 (82 y.o. Male) Treating RN: Montey Hora Primary Care Physician:  Elsie Stain Other Clinician: Referring Physician: Elsie Stain Treating Physician/Extender: Frann Rider in Treatment: 5 Encounter Discharge Information Items Discharge Pain Level: 0 Discharge Condition: Stable Ambulatory Status: Wheelchair Discharge Destination: Home Transportation: Private Auto daughter and Accompanied By: niece Schedule Follow-up Appointment: Yes Medication Reconciliation completed and provided to Patient/Care Yes Clent Damore: Provided on Clinical Summary of Care: 12/31/2016 Form Type Recipient Paper Patient JS Electronic Signature(s) Signed: 12/31/2016 11:20:30 AM By: Ruthine Dose Entered By: Ruthine Dose on 12/31/2016 11:20:30 Hunter Hardin ([id number]) -------------------------------------------------------------------------------- Lower Extremity Assessment Details Patient Name: Hunter Hardin Date of Service: 12/31/2016 10:45 AM Medical Record Number: [id number] Patient Account Number: [id number] Date of Birth/Sex: 1935-06-28 (82 y.o. Male) Treating RN: Montey Hora Primary Care Physician: Elsie Stain Other Clinician: Referring Physician: Elsie Stain Treating Physician/Extender: Frann Rider in Treatment: 5 Vascular Assessment Pulses: Dorsalis Pedis Palpable: [Right:Yes] Posterior Tibial Extremity colors, hair growth, and conditions: Extremity Color: [Right:Hyperpigmented] Temperature of Extremity: [Right:Cool] Capillary Refill: [Right:< 3 seconds] Electronic Signature(s) Signed: 12/31/2016 5:34:24 PM By: Montey Hora Entered By: Montey Hora on 12/31/2016 10:54:41 Hunter Hardin ([id number]) -------------------------------------------------------------------------------- Multi Wound Chart Details Patient Name: Hunter Hardin. Date of Service: 12/31/2016 10:45 AM Medical Record Number: [id number] Patient Account Number: [id number] Date of Birth/Sex: [birth year] (82 y.o. Male) Treating RN: Montey Hora Primary Care Physician: Elsie Stain Other Clinician: Referring Physician: Elsie Stain Treating Physician/Extender: Frann Rider in Treatment: 5 Vital Signs Height(in): 67 Pulse(bpm): 71 Weight(lbs): 138 Blood Pressure 82/58 (mmHg): Body Mass Index(BMI): 22 Temperature(F): Respiratory Rate 16 (breaths/min): Photos: [1:No Photos] [N/A:N/A] Wound Location: [1:Right Lower Leg - Circumfernential] [N/A:N/A] Wounding Event: [1:Gradually Appeared] [N/A:N/A] Primary Etiology: [1:Venous Leg Ulcer] [N/A:N/A] Comorbid History: [1:Osteoarthritis, Received Chemotherapy] [N/A:N/A] Date Acquired: [1:11/09/2016] [N/A:N/A] Weeks of Treatment: [1:5] [N/A:N/A] Wound Status: [1:Open] [N/A:N/A] Clustered Wound: [1:Yes] [N/A:N/A] Measurements L x W x  D 12.2x6x0.1 [N/A:N/A] (cm) Area (cm) : [1:57.491] [N/A:N/A] Volume (cm) : [1:5.749] [N/A:N/A] % Reduction in Area: [1:60.50%] [N/A:N/A] % Reduction in Volume: 60.50% [N/A:N/A] Classification: [1:Partial Thickness] [N/A:N/A] Exudate Amount: [1:Large] [N/A:N/A] Exudate Type: [1:Serosanguineous] [N/A:N/A] Exudate Color: [1:red, brown] [N/A:N/A] Wound Margin: [1:Flat and Intact] [N/A:N/A] Granulation Amount: [1:Medium (34-66%)] [N/A:N/A] Granulation Quality: [1:Red] [N/A:N/A] Necrotic Amount: [1:Medium (34-66%)] [N/A:N/A] Exposed Structures: [1:Fascia: No Fat: No Tendon: No Muscle: No Joint: No] [N/A:N/A] Bone: No Limited to Skin Breakdown Epithelialization: Small (1-33%) N/A N/A Periwound Skin Texture: Edema: No N/A N/A Excoriation: No Induration: No Callus: No Crepitus: No Fluctuance: No Friable: No Rash: No Scarring: No Periwound Skin Maceration: Yes N/A N/A Moisture: Moist: Yes Dry/Scaly: No Periwound Skin Color: Erythema: Yes N/A N/A Atrophie Blanche: No Cyanosis: No Ecchymosis: No Hemosiderin Staining: No Mottled: No Pallor: No Rubor: No Erythema Location: Circumferential N/A N/A Temperature: No  Abnormality N/A N/A Tenderness on Yes N/A N/A Palpation: Wound Preparation: Ulcer Cleansing: N/A N/A Rinsed/Irrigated with Saline Topical Anesthetic Applied: Other: lidocaine 4% Treatment Notes Wound #1 (Right, Circumferential Lower Leg) 1. Cleansed with: Clean wound with Normal Saline Cleanse wound with antibacterial soap and water 2. Anesthetic Topical Lidocaine 4% cream to wound bed prior to debridement 4. Dressing Applied: Aquacel Ag Other dressing (specify in notes) 5. Secondary Dressing Applied ABD Pad 7. Secured with ALHASSAN, [place] ([id number]) Tape Notes kerlix and coban, oil emulsion gauze Electronic Signature(s) Signed: 12/31/2016 11:30:48 AM By: Christin Fudge MD, FACS Entered By: Christin Fudge on 12/31/2016 11:30:47 Hunter Hardin ([id number]) -------------------------------------------------------------------------------- [place] Details Patient Name: BOAZ, BERISHA. Date of Service: 12/31/2016 10:45 AM Medical Record Number: [id number] Patient Account Number: [id number] Date of Birth/Sex: August 24, 1935 (82 y.o. Male) Treating RN: Montey Hora Primary Care Physician: Elsie Stain Other Clinician: Referring Physician: Elsie Stain Treating Physician/Extender: Frann Rider in Treatment: 5 Active Inactive Abuse / Safety / Falls / Self Care Management Nursing Diagnoses: Impaired physical mobility Potential for falls Goals: Patient will remain injury free Date Initiated: 11/26/2016 Goal Status: Active Interventions: Assess fall risk on admission and as needed Notes: Nutrition Nursing Diagnoses: Potential for alteratiion in Nutrition/Potential for imbalanced nutrition Goals: Patient/caregiver agrees to and verbalizes understanding of need to use nutritional supplements and/or vitamins as prescribed Date Initiated: 11/26/2016 Goal Status: Active Interventions: Assess patient nutrition upon admission and as needed  per policy Notes: Orientation to the Wound Care Program Nursing Diagnoses: Knowledge deficit related to the wound healing center program Goals: TAUNO, FALOTICO ([id number]) Patient/caregiver will verbalize understanding of the [place] [facility] Date Initiated: 11/26/2016 Goal Status: Active Interventions: Provide education on orientation to the wound center Notes: Venous Leg Ulcer Nursing Diagnoses: Potential for venous Insuffiency (use before diagnosis confirmed) Goals: Non-invasive venous studies are completed as ordered Date Initiated: 11/26/2016 Goal Status: Active Patient will maintain optimal edema control Date Initiated: 11/26/2016 Goal Status: Active Interventions: Assess peripheral edema status every visit. Notes: Wound/Skin Impairment Nursing Diagnoses: Impaired tissue integrity Goals: Patient/caregiver will verbalize understanding of skin care regimen Date Initiated: 11/26/2016 Goal Status: Active Ulcer/skin breakdown will have a volume reduction of 30% by week 4 Date Initiated: 11/26/2016 Goal Status: Active Ulcer/skin breakdown will have a volume reduction of 50% by week 8 Date Initiated: 11/26/2016 Goal Status: Active Ulcer/skin breakdown will have a volume reduction of 80% by week 12 Date Initiated: 11/26/2016 Goal Status: Active Ulcer/skin breakdown will heal within 14 weeks Date Initiated: 11/26/2016 ANANDA, SITZER ([id number]) Goal Status: Active Interventions: Assess patient/caregiver ability to obtain  necessary supplies Assess patient/caregiver ability to perform ulcer/skin care regimen upon admission and as needed Assess ulceration(s) every visit Notes: Electronic Signature(s) Signed: 12/31/2016 5:34:24 PM By: Montey Hora Entered By: Montey Hora on 12/31/2016 10:54:45 Hunter Hardin ([id number]) -------------------------------------------------------------------------------- Pain Assessment Details Patient Name: Hunter Hardin. Date of Service: 12/31/2016 10:45 AM Medical Record Number: [id number] Patient Account Number: [id number] Date of Birth/Sex: 1935-11-29 (82 y.o. Male) Treating RN: Montey Hora Primary Care Physician: Elsie Stain Other Clinician: Referring Physician: Elsie Stain Treating Physician/Extender: Frann Rider in Treatment: 5 Active Problems Location of Pain Severity and Description of Pain Patient Has Paino Yes Site Locations Pain Location: Pain in Ulcers With Dressing Change: Yes Duration of the Pain. Constant / Intermittento Constant Rate the pain. Current Pain Level: 5 Worst Pain Level: 10 Least Pain Level: 3 Character of Pain Describe the Pain: Aching Pain Management and Medication Current Pain Management: Electronic Signature(s) Signed: 12/31/2016 5:34:24 PM By: Montey Hora Entered By: Montey Hora on 12/31/2016 10:40:09 Hunter Hardin ([id number]) -------------------------------------------------------------------------------- Patient/Caregiver Education Details Patient Name: Hunter Hardin Date of Service: 12/31/2016 10:45 AM Medical Record Number: [id number] Patient Account Number: [id number] Date of Birth/Gender: 02-Dec-1935 (82 y.o. Male) Treating RN: Montey Hora Primary Care Physician: Elsie Stain Other Clinician: Referring Physician: Elsie Stain Treating Physician/Extender: Frann Rider in Treatment: 5 Education Assessment Education Provided To: Patient Education Topics Provided Wound/Skin Impairment: Handouts: Other: change dressing as ordered Methods: Demonstration, Explain/Verbal Responses: State content correctly Electronic Signature(s) Signed: 12/31/2016 5:34:24 PM By: Montey Hora Entered By: Montey Hora on 12/31/2016 11:19:30 Hunter Hardin ([id number]) -------------------------------------------------------------------------------- Wound Assessment Details Patient Name: Hunter Hardin. Date  of Service: 12/31/2016 10:45 AM Medical Record Number: [id number] Patient Account Number: [id number] Date of Birth/Sex: 1935/07/07 (82 y.o. Male) Treating RN: Montey Hora Primary Care Physician: Elsie Stain Other Clinician: Referring Physician: Elsie Stain Treating Physician/Extender: Frann Rider in Treatment: 5 Wound Status Wound Number: 1 Primary Venous Leg Ulcer Etiology: Wound Location: Right Lower Leg - Circumfernential Wound Status: Open Wounding Event: Gradually Appeared Comorbid Osteoarthritis, Received History: Chemotherapy Date Acquired: 11/09/2016 Weeks Of Treatment: 5 Clustered Wound: Yes Photos Photo Uploaded By: Alric Quan on 12/31/2016 12:35:28 Wound Measurements Length: (cm) 12.2 Width: (cm) 6 Depth: (cm) 0.1 Area: (cm) 57.491 Volume: (cm) 5.749 % Reduction in Area: 60.5% % Reduction in Volume: 60.5% Epithelialization: Small (1-33%) Tunneling: No Undermining: No Wound Description Classification: Partial Thickness Wound Margin: Flat and Intact Exudate Amount: Large Exudate Type: Serosanguineous Exudate Color: red, brown Foul Odor After Cleansing: No Wound Bed Granulation Amount: Medium (34-66%) Exposed Structure Granulation Quality: Red Fascia Exposed: No Necrotic Amount: Medium (34-66%) Fat Layer Exposed: No ARMANII, PRESSNELL ([id number]) Necrotic Quality: Adherent Slough Tendon Exposed: No Muscle Exposed: No Joint Exposed: No Bone Exposed: No Limited to Skin Breakdown Periwound Skin Texture Texture Color No Abnormalities Noted: No No Abnormalities Noted: No Callus: No Atrophie Blanche: No Crepitus: No Cyanosis: No Excoriation: No Ecchymosis: No Fluctuance: No Erythema: Yes Friable: No Erythema Location: Circumferential Induration: No Hemosiderin Staining: No Localized Edema: No Mottled: No Rash: No Pallor: No Scarring: No Rubor: No Moisture Temperature / Pain No Abnormalities Noted: No Temperature: No  Abnormality Dry / Scaly: No Tenderness on Palpation: Yes Maceration: Yes Moist: Yes Wound Preparation Ulcer Cleansing: Rinsed/Irrigated with Saline Topical Anesthetic Applied: Other: lidocaine 4%, Treatment Notes Wound #1 (Right, Circumferential Lower Leg) 1. Cleansed with: Clean wound with Normal Saline Cleanse wound with antibacterial soap and water 2. Anesthetic Topical Lidocaine 4% cream to  wound bed prior to debridement 4. Dressing Applied: Aquacel Ag Other dressing (specify in notes) 5. Secondary Dressing Applied ABD Pad 7. Secured with Tape Notes kerlix and coban, oil emulsion gauze Electronic Signature(s) JAYDAN, MEIDINGER ([id number]) Signed: 12/31/2016 5:34:24 PM By: Montey Hora Entered By: Montey Hora on 12/31/2016 10:52:13 Hunter Hardin ([id number]) -------------------------------------------------------------------------------- [place] Details Patient Name: Hunter Hardin Date of Service: 12/31/2016 10:45 AM Medical Record Number: [id number] Patient Account Number: [id number] Date of Birth/Sex: 12-27-1934 (82 y.o. Male) Treating RN: Montey Hora Primary Care Physician: Elsie Stain Other Clinician: Referring Physician: Elsie Stain Treating Physician/Extender: Frann Rider in Treatment: 5 Vital Signs Time Taken: 10:40 Pulse (bpm): 71 Height (in): 67 Respiratory Rate (breaths/min): 16 Weight (lbs): 138 Blood Pressure (mmHg): 82/58 Body Mass Index (BMI): 21.6 Reference Range: 80 - 120 mg / dl Notes bp taken [facility] when taken with the dinamap it was 56/38 Dr. Con Memos made aware Electronic Signature(s) Signed: 12/31/2016 5:34:24 PM By: Montey Hora Entered By: Montey Hora on 12/31/2016 10:45:50

## 2017-01-01 NOTE — Progress Notes (Signed)
SETH, HIGGINBOTHAM ([id number]) Visit Report for 12/31/2016 Chief Complaint Document Details Patient Name: Hunter Hardin, Hunter Hardin. Date of Service: 12/31/2016 10:45 AM Medical Record Number: [id number] Patient Account Number: [id number] Date of Birth/Sex: 05-01-1935 (82 y.o. Male) Treating RN: Ahmed Prima Primary Care Physician: Elsie Stain Other Clinician: Referring Physician: Elsie Stain Treating Physician/Extender: Frann Rider in Treatment: 5 Information Obtained from: Patient Chief Complaint Patient presents to the wound care center for a evaluation of non healing wound to both lower extremities Electronic Signature(s) Signed: 12/31/2016 11:30:54 AM By: Christin Fudge MD, FACS Entered By: Christin Fudge on 12/31/2016 11:30:53 [name] ([id number]) -------------------------------------------------------------------------------- HPI Details Patient Name: [name] Date of Service: 12/31/2016 10:45 AM Medical Record Number: [id number] Patient Account Number: [id number] Date of Birth/Sex: [birth year] (82 y.o. Male) Treating RN: Ahmed Prima Primary Care Physician: Elsie Stain Other Clinician: Referring Physician: Elsie Stain Treating Physician/Extender: Frann Rider in Treatment: 5 History of Present Illness Location: both lower extremities right worse than left Quality: Patient reports experiencing a dull pain to affected area(s). Severity: Patient states wound are getting worse. Duration: Patient has had the wound for < 3 weeks prior to presenting for treatment Timing: Pain in wound is Intermittent (comes and goes Context: The wound would happen gradually Modifying Factors: Other treatment(s) tried include:local ointments and local care Associated Signs and Symptoms: Patient reports having increase swelling. 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 Past medical history significant for essential hypertension, pleural effusion, colon cancer, hypothyroidism, renal cell cancer, hyperglycemia, thrombocytopenia, protein calorie malnutrition. the patient lives alone and has very little support and has a family friend who is helping him with his appointments. He has not seen a dermatologist for this condition recently. 12/03/2016 -- he continues to have a lot of pain and has not yet seen a dermatologist. 12-10-16 Mr Renbarger returns today for evaluation of his bilateral lower strumming ulcerations. He has not made an appointment with dermatology as yet. He is accompanied by his cousin, she has accompanied him to all of his appointments and is inquiring about the need for dermatology in light of his overall state of health. He recently had a thoracentesis (12/02/2016) with removal of "2 canisters". Mr. Basey primary complaint is of pain to these ulcerations, he has been prescribed pain medication and takes that nightly. He is voicing no complaints regarding dressing changes or home health. 12/17/2016 -- right spoken to his PCP, Dr. Damita Dunnings regarding his pain management and sleep deprivation and Dr. Damita Dunnings was kind enough to take care of this. The patient has not yet seen dermatology. 12/24/2016 -- he did go to dermatology and though we do not have their notes his sister says that they put him on antibiotic which is not yet named and told him to continue with wound care as we have better equipped to treat him. 12/31/2016 -- the patient's daughter is here from [place] visiting and had several questions answered by me. The patient continues to be on hospice care, as a total drain placed for his pleural effusion and continues to be well monitored and treated by hospice Electronic  Signature(s) Signed: 12/31/2016 11:31:36 AM By: Christin Fudge MD, FACS Entered By: Christin Fudge on 12/31/2016 11:31:35 [name] ([id number]) KOLE, HILYARD ([id number]) -------------------------------------------------------------------------------- Physical Exam  Details Patient Name: DRAEDYN, WEIDINGER. Date of Service: 12/31/2016 10:45 AM Medical Record Number: [id number] Patient Account Number: [id number] Date of Birth/Sex: [birth year] (82 y.o. Male) Treating RN: Ahmed Prima Primary Care Physician: Elsie Stain Other Clinician: Referring Physician: Elsie Stain Treating Physician/Extender: Frann Rider in Treatment: 5 Constitutional . Pulse regular. Respirations normal and unlabored. Afebrile. . Eyes Nonicteric. Reactive to light. Ears, Nose, Mouth, and Throat Lips, teeth, and gums WNL.[name] Moist mucosa without lesions. Neck supple and nontender. No palpable supraclavicular or cervical adenopathy. Normal sized without goiter. Respiratory WNL. No retractions.. Cardiovascular Pedal Pulses WNL. No clubbing, cyanosis or edema. Lymphatic No adneopathy. No adenopathy. No adenopathy. Musculoskeletal Adexa without tenderness or enlargement.. Digits and nails w/o clubbing, cyanosis, infection, petechiae, ischemia, or inflammatory conditions.. Integumentary (Hair, Skin) No suspicious lesions. No crepitus or fluctuance. No peri-wound warmth or erythema. No masses.[name] Psychiatric Judgement and insight Intact.. No evidence of depression, anxiety, or agitation.. Notes the right lower extremity wound continues to be more significant posteriorly just above the ankle and no sharp debridement is possible non-needed. Electronic Signature(s) Signed: 12/31/2016 11:32:18 AM By: Christin Fudge MD, FACS Entered By: Christin Fudge on 12/31/2016 11:32:17 [name] ([id number]) -------------------------------------------------------------------------------- Physician  Orders Details Patient Name: IRIE, FIORELLO. Date of Service: 12/31/2016 10:45 AM Medical Record Number: [id number] Patient Account Number: [id number] Date of Birth/Sex: [birth year] (82 y.o. Male) Treating RN: Montey Hora Primary Care Physician: Elsie Stain Other Clinician: Referring Physician: Elsie Stain Treating Physician/Extender: Frann Rider in Treatment: 5 Verbal / Phone Orders: Yes Clinician: Montey Hora Read Back and Verified: Yes Diagnosis Coding Wound Cleansing Wound #1 Right,Circumferential Lower Leg o Clean wound with Normal Saline. o Cleanse wound with mild soap and water Anesthetic Wound #1 Right,Circumferential Lower Leg o Topical Lidocaine 4% cream applied to wound bed prior to debridement - clinic use Primary Wound Dressing Wound #1 Right,Circumferential Lower Leg o Aquacel Ag - Place down on top of the oil emulsion gauze o Other: - oil emulsion gauze (place down first) Secondary Dressing Wound #1 Right,Circumferential Lower Leg o ABD pad - if needed for drainage Dressing Change Frequency Wound #1 Right,Circumferential Lower Leg o Three times weekly - will be changed at [facility] on Thursdays Follow-up Appointments Wound #1 Right,Circumferential Lower Leg o Return Appointment in 1 week. Edema Control Wound #1 Right,Circumferential Lower Leg o Kerlix and Coban - Bilateral - lightly Additional Orders / Instructions Wound #1 Right,Circumferential Lower Leg o Increase protein intake. MAXI, RODAS ([id number]) Home Health Wound #1 Right,Circumferential Lower Leg o [place] Visits - Patient is now covered under [employer] for wound care please o Home Health Nurse may visit PRN to address patientos wound care needs. o FACE TO FACE ENCOUNTER: MEDICARE and MEDICAID PATIENTS: I certify that this patient is under my care and that I had a face-to-face encounter that meets the physician  face-to-face encounter requirements with this patient on this date. The encounter with the patient was in whole or in part for the following MEDICAL CONDITION: (primary reason for [place]) MEDICAL NECESSITY: I certify, that based on my findings, NURSING services are a medically necessary home health service. HOME BOUND STATUS: I certify that my clinical findings support that this patient is homebound (i.e., Due to illness or injury, pt requires aid of supportive devices such as crutches, cane, wheelchairs, walkers, the use of special transportation or the assistance of another person to leave their place of residence. There is a normal inability to  leave the home and doing so requires considerable and taxing effort. Other absences are for medical reasons / religious services and are infrequent or of short duration when for other reasons). o If current dressing causes regression in wound condition, may D/C ordered dressing product/s and apply Normal Saline Moist Dressing daily until next [place] / Other MD appointment. [place] of regression in wound condition at [phone]. o Please direct any NON-WOUND related issues/requests for orders to patient's Primary Care Physician o D/C [place] - Please discharge from [facility] as patient now has Hospice services Medications-please add to medication list. Wound #1 Right,Circumferential Lower Leg o P.O. Antibiotics - Continue taking your antibiotics o Other: - start taking a Probiotic with the antibiotic Electronic Signature(s) Signed: 12/31/2016 3:54:10 PM By: Christin Fudge MD, FACS Signed: 12/31/2016 5:34:24 PM By: Montey Hora Entered By: Montey Hora on 12/31/2016 11:09:16 [name] ([id number]) -------------------------------------------------------------------------------- Problem List Details Patient Name: JAMAL, PAVON. Date of Service: 12/31/2016  10:45 AM Medical Record Number: [id number] Patient Account Number: [id number] Date of Birth/Sex: 07-08-1935 (82 y.o. 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 to 11/26/2016 Yes breakdown of skin I89.0 Lymphedema, not elsewhere classified 11/26/2016 Yes E44.0 Moderate protein-calorie malnutrition 11/26/2016 Yes L13.9 Bullous disorder, unspecified 11/26/2016 Yes Z85.528 Personal history of other malignant neoplasm of kidney 11/26/2016 Yes Inactive Problems Resolved Problems Electronic Signature(s) Signed: 12/31/2016 11:30:43 AM By: Christin Fudge MD, FACS Entered By: Christin Fudge on 12/31/2016 11:30:43 [name] ([id number]) -------------------------------------------------------------------------------- Progress Note Details Patient Name: [name] Date of Service: 12/31/2016 10:45 AM Medical Record Number: [id number] Patient Account Number: [id number] Date of Birth/Sex: Sep 12, 1935 (82 y.o. Male) Treating RN: Ahmed Prima Primary Care Physician: Elsie Stain Other Clinician: Referring Physician: Elsie Stain Treating Physician/Extender: Frann Rider in Treatment: 5 Subjective Chief Complaint Information obtained from Patient Patient presents to the wound care center for a evaluation of non healing wound to both lower extremities History of Present Illness (HPI) The following HPI elements were documented for the patient's wound: Location: both lower extremities right worse than left Quality: Patient reports experiencing a dull pain to affected area(s). Severity: Patient states wound are  getting worse. Duration: Patient has had the wound for < 3 weeks prior to presenting for treatment Timing: Pain in wound is Intermittent (comes and goes Context: The wound would happen gradually Modifying Factors: Other treatment(s) tried include:local ointments and local care Associated Signs and Symptoms: Patient reports having increase swelling. 82 year old patient with a history of colon cancer status post right hemicolectomy and stage IV renal cell carcinoma with metastasis to the lung and bone also has hypertension, chronic bilateral pleural effusion and chronic swelling in his legs which are now getting worse. Past medical history significant for essential hypertension, pleural effusion, colon cancer, hypothyroidism, renal cell cancer, hyperglycemia, thrombocytopenia, protein calorie malnutrition. the patient lives alone and has very little support and has a family friend who is helping him with his appointments. He has not seen a dermatologist for this condition recently. 12/03/2016 -- he continues to have a lot of pain and has not yet seen a dermatologist. 12-10-16 Mr Dome returns today for evaluation of his bilateral lower strumming ulcerations.  He has not made an appointment with dermatology as yet. He is accompanied by his cousin, she has accompanied him to all of his appointments and is inquiring about the need for dermatology in light of his overall state of health. He recently had a thoracentesis (12/02/2016) with removal of "2 canisters". Mr. Hammerschmidt primary complaint is of pain to these ulcerations, he has been prescribed pain medication and takes that nightly. He is voicing no complaints regarding dressing changes or home health. 12/17/2016 -- right spoken to his PCP, Dr. Damita Dunnings regarding his pain management and sleep deprivation and Dr. Damita Dunnings was kind enough to take care of this. The patient has not yet seen dermatology. 12/24/2016 -- he did go to dermatology and  though we do not have their notes his sister says that they put him on antibiotic which is not yet named and told him to continue with wound care as we have better equipped to treat him. 12/31/2016 -- the patient's daughter is here from [place] visiting and had several questions answered by [name] ([id number]) me. The patient continues to be on hospice care, as a total drain placed for his pleural effusion and continues to be well monitored and treated by hospice Objective Constitutional Pulse regular. Respirations normal and unlabored. Afebrile. Vitals Time Taken: 10:40 AM, Height: 67 in, Weight: 138 lbs, BMI: 21.6, Pulse: 71 bpm, Respiratory Rate: 16 breaths/min, Blood Pressure: 82/58 mmHg. General Notes: bp taken [facility] when taken with the dinamap it was 56/38 Dr. Con Memos made aware Eyes Nonicteric. Reactive to light. Ears, Nose, Mouth, and Throat Lips, teeth, and gums WNL.[name] Moist mucosa without lesions. Neck supple and nontender. No palpable supraclavicular or cervical adenopathy. Normal sized without goiter. Respiratory WNL. No retractions.. Cardiovascular Pedal Pulses WNL. No clubbing, cyanosis or edema. Lymphatic No adneopathy. No adenopathy. No adenopathy. Musculoskeletal Adexa without tenderness or enlargement.. Digits and nails w/o clubbing, cyanosis, infection, petechiae, ischemia, or inflammatory conditions.[name] Psychiatric Judgement and insight Intact.. No evidence of depression, anxiety, or agitation.. General Notes: the right lower extremity wound continues to be more significant posteriorly just above the ankle and no sharp debridement is possible non-needed. Integumentary (Hair, Skin) No suspicious lesions. No crepitus or fluctuance. No peri-wound warmth or erythema. No masses.Carsten Carstarphen, Elyse Jarvis ([id number]) Wound #1 status is Open. Original cause of wound was Gradually Appeared. The wound is located on the Right,Circumferential Lower Leg. The wound  measures 12.2cm length x 6cm width x 0.1cm depth; 57.491cm^2 area and 5.749cm^3 volume. The wound is limited to skin breakdown. There is no tunneling or undermining noted. There is a large amount of serosanguineous drainage noted. The wound margin is flat and intact. There is medium (34-66%) red granulation within the wound bed. There is a medium (34-66%) amount of necrotic tissue within the wound bed including Adherent Slough. The periwound skin appearance exhibited: Maceration, Moist, Erythema. The periwound skin appearance did not exhibit: Callus, Crepitus, Excoriation, Fluctuance, Friable, Induration, Localized Edema, Rash, Scarring, Dry/Scaly, Atrophie Blanche, Cyanosis, Ecchymosis, Hemosiderin Staining, Mottled, Pallor, Rubor. The surrounding wound skin color is noted with erythema which is circumferential. Periwound temperature was noted as No Abnormality. The periwound has tenderness on palpation. 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 Cleanse wound with mild soap and water Anesthetic: Wound #1 Right,Circumferential Lower Leg: Topical Lidocaine 4% cream applied to wound bed prior to debridement - clinic use Primary Wound Dressing: Wound #1 Right,Circumferential Lower Leg: Aquacel Ag - Place down on top of the oil emulsion gauze Other: - oil emulsion gauze (place down first) Secondary Dressing: Wound #1 Right,Circumferential Lower Leg: ABD pad - if needed for drainage Dressing Change Frequency: Wound #1 Right,Circumferential Lower  Leg: JIHAD, BROWNLOW ([id number]) Three times weekly - will be changed at [facility] on Thursdays Follow-up Appointments: Wound #1 Right,Circumferential Lower Leg: Return Appointment in 1 week. Edema Control: Wound #1 Right,Circumferential Lower Leg: Kerlix and Coban - Bilateral - lightly Additional Orders / Instructions: Wound #1 Right,Circumferential Lower Leg: Increase protein intake. Home Health: Wound #1 Right,Circumferential Lower Leg: Continue Home Health Visits - Patient is now covered under [employer] for wound care please Home Health Nurse may visit PRN to address patient s wound care needs. FACE TO FACE ENCOUNTER: MEDICARE and MEDICAID PATIENTS: I certify that this patient is under my care and that I had a face-to-face encounter that meets the physician face-to-face encounter requirements with this patient on this date. The encounter with the patient was in whole or in part for the following MEDICAL CONDITION: (primary reason for [place]) MEDICAL NECESSITY: I certify, that based on my findings, NURSING services are a medically necessary home health service. HOME BOUND STATUS: I certify that my clinical findings support that this patient is homebound (i.e., Due to illness or injury, pt requires aid of supportive devices such as crutches, cane, wheelchairs, walkers, the use of special transportation or the assistance of another person to leave their place of residence. There is a normal inability to leave the home and doing so requires considerable and taxing effort. Other absences are for medical reasons / religious services and are infrequent or of short duration when for other reasons). If current dressing causes regression in wound condition, may D/C ordered dressing product/s and apply Normal Saline Moist Dressing daily until next [place] / Other MD appointment. [place] of regression in wound condition at  [phone]. Please direct any NON-WOUND related issues/requests for orders to patient's Primary Care Physician D/C [place] - [facility] discharge from [facility] as patient now has Hospice services Medications-please add to medication list.: Wound #1 Right,Circumferential Lower Leg: P.O. Antibiotics - Continue taking your antibiotics Other: - start taking a Probiotic with the antibiotic At this stage his management is palliative, he is seeing hospice care and hence I have recommended: 1. a nonadherent layer, Silver alginate, to be lightly wrapped with Kerlix and Coban and change 3 times a week if there is significant drainage, to his right lower extremity 2. the dermatologist has put him on antibiotics for a staph infection which was cultured from the skin wound. 3. I have asked him to take some probiotics or yogurt with active cultures 4. Elevation and exercise have been discussed with him 5. Increase his protein intake, vitamin A, vitamin C and zinc RUXIN, RANSOME. ([id number]) 6. Weekly visits to the wound center for evaluation He and his caregiver do understand that our treatment is going to be palliative to help him alleviate some of his symptoms Electronic Signature(s) Signed: 12/31/2016 11:33:36 AM By: Christin Fudge MD, FACS Entered By: Christin Fudge on 12/31/2016 11:33:36 [name] ([id number]) --------------------------------------------------------------------------------  SuperBill Details Patient Name: KAIEN, PEZZULLO. Date of Service: 12/31/2016 Medical Record Number: [id number] Patient Account Number: [id number] Date of Birth/Sex: Oct 27, 1935 (82 y.o. 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 of skin I89.0 Lymphedema, not elsewhere classified E44.0 Moderate protein-calorie malnutrition L13.9 Bullous disorder, unspecified Z85.528 Personal history of other malignant neoplasm of kidney Facility Procedures CPT4 Code: 16073710 Description: 99213 - WOUND CARE VISIT-LEV 3 EST PT Modifier: Quantity: 1 Physician Procedures CPT4 Code Description: [id number] - WC PHYS LEVEL 3 - EST PT ICD-10 Description Diagnosis L97.211 Non-pressure chronic ulcer of right calf limited to L97.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

## 2017-01-01 NOTE — Patient Instructions (Addendum)
Take liquid potassium only when taking lasix/furosemide for fluid in the legs.   Cut the doxazosin in half, down to '4mg'$  a day and update me if having trouble with urination.  Stop propranol.  Draw 0.5L today from catheter if tolerated.  Okay to change order to 0.5-1 L every 2-3 days if tolerated.  Okay to use O2 by nasal cannula up to 4L as needed for shortness of breath (with small tank for travel if needed). Take care.  Glad to see you.

## 2017-01-01 NOTE — Progress Notes (Signed)
Hospital f/u.    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 Colon cancer ([place]) dx'd 01/2013  . Elevated PSA    H/O  . GERD (gastroesophageal reflux disease)   . HLD (hyperlipidemia)   . Hypertension   . Hypothyroidism   . Metastasis to lung ([place]) dx'd 01/2013  . Pleural effusion   . Protein calorie malnutrition ([place]) 06/13/2013  . renal ca dx'd 01/2013    HOSPITAL COURSE:   1.  Persistent left pleural effusion with shortness of breath. Interventional radiology at [facility] was unable to do procedure over the weekend. I had pulmonary do a thoracentesis to make the patient feel better with regards to his breathing. 1.2 liters taken off. Patient will keep his appointment at [facility] for Pleurx catheter on Monday.  2. Hypotension post procedure. I held the patient's evening medications last night and I decreased his dose of verapamil. 3. Metastatic renal cancer to lung and pleural space. Overall prognosis poor. Patient is followed by hospice. 4. Hypothyroidism unspecified on levothyroxine =============================================  Admitted for thoracentesis with low BP noted after fluid removal with med changes noted above.  Now with pleurex placed for recurrent taps.  D/w pt and family about how much to draw and how often, to try to make him comfortable w/o inducing hypotension.  See AVS and plans.  Fluid drawn  off 1L; last done 2 days ago.  Still on hospice care.    In meantime, kdur (large pill) has been tough to swallow.  D/w pt about when to take and how to take, with lasix and using liquid med.    Still with SOB and he was asking about O2 use; I think it is okay to use if that would make him feel better.  See AVS re: orders.    He still has cough and mucous in the chest.  It is worse at night and more difficult at night to clear.  Adjustable bed helps some, sleeping propped up. mucinex may be counter productive.   Also with L ear cerumen impaction affecting hearing.  D/w pt.  No pain.    PMH and SH reviewed  ROS: Per HPI unless specifically indicated in ROS section   Meds, vitals, and allergies reviewed.   GEN: nad, alert and oriented, chronically ill appearing man in wheelchair HEENT: mucous membranes moist, L cerumen impaction removed with curette w/o complication and he felt better.   NECK: supple w/o LA CV: rrr.  PULM: dec BS on the L side note, no inc wob, pleurex CDI on the L chest wall ABD: soft, +bs EXT: no edema SKIN: no acute rash

## 2017-01-02 DIAGNOSIS — C649 Malignant neoplasm of unspecified kidney, except renal pelvis: Secondary | ICD-10-CM | POA: Diagnosis not present

## 2017-01-02 DIAGNOSIS — C7800 Secondary malignant neoplasm of unspecified lung: Secondary | ICD-10-CM | POA: Diagnosis not present

## 2017-01-02 DIAGNOSIS — J910 Malignant pleural effusion: Secondary | ICD-10-CM | POA: Diagnosis not present

## 2017-01-02 DIAGNOSIS — R1310 Dysphagia, unspecified: Secondary | ICD-10-CM | POA: Diagnosis not present

## 2017-01-02 DIAGNOSIS — R634 Abnormal weight loss: Secondary | ICD-10-CM | POA: Diagnosis not present

## 2017-01-02 DIAGNOSIS — N401 Enlarged prostate with lower urinary tract symptoms: Secondary | ICD-10-CM | POA: Diagnosis not present

## 2017-01-04 DIAGNOSIS — N401 Enlarged prostate with lower urinary tract symptoms: Secondary | ICD-10-CM | POA: Diagnosis not present

## 2017-01-04 DIAGNOSIS — H6120 Impacted cerumen, unspecified ear: Secondary | ICD-10-CM | POA: Insufficient documentation

## 2017-01-04 DIAGNOSIS — R1310 Dysphagia, unspecified: Secondary | ICD-10-CM | POA: Diagnosis not present

## 2017-01-04 DIAGNOSIS — J910 Malignant pleural effusion: Secondary | ICD-10-CM | POA: Diagnosis not present

## 2017-01-04 DIAGNOSIS — R634 Abnormal weight loss: Secondary | ICD-10-CM | POA: Diagnosis not present

## 2017-01-04 DIAGNOSIS — C7800 Secondary malignant neoplasm of unspecified lung: Secondary | ICD-10-CM | POA: Diagnosis not present

## 2017-01-04 DIAGNOSIS — C649 Malignant neoplasm of unspecified kidney, except renal pelvis: Secondary | ICD-10-CM | POA: Diagnosis not present

## 2017-01-04 NOTE — Assessment & Plan Note (Signed)
Compounded by post tap hypotension and peripheral edema.  Take liquid potassium only when taking lasix/furosemide for fluid in the legs.   Cut the doxazosin in half, down to '4mg'$  a day and update me if having trouble with urination.  Stop propranol.  Draw 0.5L today from catheter if tolerated.  Okay to change order to 0.5-1 L every 2-3 days if tolerated.  Okay to use O2 by nasal cannula up to 4L as needed for shortness of breath (with small tank for travel if needed). Patient/hospice/family can update me.  He and family agreed.

## 2017-01-04 NOTE — Assessment & Plan Note (Signed)
Continue hospice care, not on chemo/tx at this point.

## 2017-01-04 NOTE — Assessment & Plan Note (Signed)
Now resolved.  No complications.  He felt better.

## 2017-01-07 ENCOUNTER — Ambulatory Visit: Admitting: Surgery

## 2017-01-07 DIAGNOSIS — C649 Malignant neoplasm of unspecified kidney, except renal pelvis: Secondary | ICD-10-CM | POA: Diagnosis not present

## 2017-01-07 DIAGNOSIS — R1310 Dysphagia, unspecified: Secondary | ICD-10-CM | POA: Diagnosis not present

## 2017-01-07 DIAGNOSIS — R634 Abnormal weight loss: Secondary | ICD-10-CM | POA: Diagnosis not present

## 2017-01-07 DIAGNOSIS — N401 Enlarged prostate with lower urinary tract symptoms: Secondary | ICD-10-CM | POA: Diagnosis not present

## 2017-01-07 DIAGNOSIS — J910 Malignant pleural effusion: Secondary | ICD-10-CM | POA: Diagnosis not present

## 2017-01-07 DIAGNOSIS — C7800 Secondary malignant neoplasm of unspecified lung: Secondary | ICD-10-CM | POA: Diagnosis not present

## 2017-01-08 ENCOUNTER — Telehealth: Payer: Self-pay | Admitting: *Deleted

## 2017-01-08 DIAGNOSIS — C7800 Secondary malignant neoplasm of unspecified lung: Secondary | ICD-10-CM | POA: Diagnosis not present

## 2017-01-08 DIAGNOSIS — C649 Malignant neoplasm of unspecified kidney, except renal pelvis: Secondary | ICD-10-CM | POA: Diagnosis not present

## 2017-01-08 DIAGNOSIS — J910 Malignant pleural effusion: Secondary | ICD-10-CM | POA: Diagnosis not present

## 2017-01-08 DIAGNOSIS — N401 Enlarged prostate with lower urinary tract symptoms: Secondary | ICD-10-CM | POA: Diagnosis not present

## 2017-01-08 DIAGNOSIS — R1310 Dysphagia, unspecified: Secondary | ICD-10-CM | POA: Diagnosis not present

## 2017-01-08 DIAGNOSIS — R634 Abnormal weight loss: Secondary | ICD-10-CM | POA: Diagnosis not present

## 2017-01-08 MED ORDER — CEPHALEXIN 250 MG PO CAPS: 250.0000 mg | ORAL_CAPSULE | Freq: Four times a day (QID) | ORAL | 0 refills | Status: DC

## 2017-01-08 NOTE — Telephone Encounter (Signed)
Almyra Free with Hospice called stating that she was the nurse that went out to drain the patient's pleurex tube yesterday  and she drained out 200 liters, but got out a lot of air bubbles. Almyra Free stated that Dr. Molly Maduro advised her to call because patient could be at risk if he has air in the tube for a collapse lung. Almyra Free stated that the patient was comfortable after the drainage. Almyra Free stated that a nurse will be going out again Sunday.

## 2017-01-08 NOTE — Telephone Encounter (Signed)
Spoke with Robin from Hospice and informed her of Dr. Josefine Class instructions.

## 2017-01-08 NOTE — Telephone Encounter (Signed)
Shirlean Mylar called stating that she has a couple of concerns and she saw the patient today. Shirlean Mylar stated that on Friday she drained out 1000 liters and Monday she drained 750 liters. Shirlean Mylar stated another nurse went out yesterday and drained 100 liters, but was getting a lot of air bubbles. Shirlean Mylar stated the nurse  replaced the tube and got another 100 liters out and air bubbles and patient was complaining of SOB Shirlean Mylar stated that she is also concerned about his left ankle which is swollen with 2-3 edema, tender to touch and red streaks. Patient stated that is the way the ulcers started on his right leg. Shirlean Mylar stated that the ulcers on his right leg have healed over. Shirlean Mylar stated that patient's daughter gave him Lasix and Potassium today which may help the edema. Robin request a call back.

## 2017-01-08 NOTE — Telephone Encounter (Signed)
He is going to have a persistently collapsed lung with the cancer/prev chronic changes.  He'll likely never get full inflation of the lung.  I would only drain a small amount of fluid for symptomatic relief, even if only 159m.  I wouldn't pull enough to draw air.  Patient may need to be repositioned, either sitting up or on his side to get fluid drawn off.   I would start keflex for the ankle.  rx sent, keep it elevated and update me as needed.    Thanks.

## 2017-01-10 DIAGNOSIS — R634 Abnormal weight loss: Secondary | ICD-10-CM | POA: Diagnosis not present

## 2017-01-10 DIAGNOSIS — C7800 Secondary malignant neoplasm of unspecified lung: Secondary | ICD-10-CM | POA: Diagnosis not present

## 2017-01-10 DIAGNOSIS — C649 Malignant neoplasm of unspecified kidney, except renal pelvis: Secondary | ICD-10-CM | POA: Diagnosis not present

## 2017-01-10 DIAGNOSIS — J910 Malignant pleural effusion: Secondary | ICD-10-CM | POA: Diagnosis not present

## 2017-01-10 DIAGNOSIS — N401 Enlarged prostate with lower urinary tract symptoms: Secondary | ICD-10-CM | POA: Diagnosis not present

## 2017-01-10 DIAGNOSIS — R1310 Dysphagia, unspecified: Secondary | ICD-10-CM | POA: Diagnosis not present

## 2017-01-11 ENCOUNTER — Other Ambulatory Visit: Payer: Self-pay | Admitting: Family Medicine

## 2017-01-11 ENCOUNTER — Telehealth: Payer: Self-pay

## 2017-01-11 DIAGNOSIS — C649 Malignant neoplasm of unspecified kidney, except renal pelvis: Secondary | ICD-10-CM | POA: Diagnosis not present

## 2017-01-11 DIAGNOSIS — J910 Malignant pleural effusion: Secondary | ICD-10-CM | POA: Diagnosis not present

## 2017-01-11 DIAGNOSIS — N401 Enlarged prostate with lower urinary tract symptoms: Secondary | ICD-10-CM | POA: Diagnosis not present

## 2017-01-11 DIAGNOSIS — C7800 Secondary malignant neoplasm of unspecified lung: Secondary | ICD-10-CM | POA: Diagnosis not present

## 2017-01-11 DIAGNOSIS — R1310 Dysphagia, unspecified: Secondary | ICD-10-CM | POA: Diagnosis not present

## 2017-01-11 DIAGNOSIS — R634 Abnormal weight loss: Secondary | ICD-10-CM | POA: Diagnosis not present

## 2017-01-11 MED ORDER — MORPHINE SULFATE 20 MG/5ML PO SOLN: 2.5000 mg | ORAL | 0 refills | Status: DC | PRN

## 2017-01-11 MED ORDER — ALBUTEROL SULFATE (2.5 MG/3ML) 0.083% IN NEBU: 2.5000 mg | INHALATION_SOLUTION | RESPIRATORY_TRACT | 1 refills | Status: AC | PRN

## 2017-01-11 MED ORDER — MORPHINE SULFATE 20 MG/5ML PO SOLN: ORAL | 0 refills | Status: DC

## 2017-01-11 NOTE — Progress Notes (Signed)
Morphine rx corrected, old rx shredded.  Thanks.

## 2017-01-11 NOTE — Progress Notes (Signed)
Rx faxed to pharmacy as instructed. 

## 2017-01-11 NOTE — Telephone Encounter (Signed)
Rx faxed to pharmacy as instructed. 

## 2017-01-11 NOTE — Telephone Encounter (Addendum)
If they have any liquid morphine, okay to disp qs for 2.5-5 mg po q2 hours prn SOB.   Disp qs for 50 doses.   If they don't have liquid, then let me know if they have any plain morphine tabs.  Thanks.

## 2017-01-11 NOTE — Telephone Encounter (Signed)
Noted. Thanks.

## 2017-01-11 NOTE — Telephone Encounter (Signed)
Hunter Hardin with [facility] left v/m requesting cb; the rx sent was morphine 20 mg per tsp; [facility] does not have that strength. Hunter Hardin request cb to verify strength and quantity.Please advise.

## 2017-01-11 NOTE — Telephone Encounter (Signed)
Pleurex drainage tubes in his lungs. Yesterday they only got 50cc out. A week ago they got 1000cc and on 01-07-17 she got 750cc. 01-08-17 they got 200cc with a lot of air bubbles. He is having a lot of shortness of breath. Cannot breathe in deeply. Not feeling very well. Needs to know what to do [phone]. She is at this house now.

## 2017-01-11 NOTE — Telephone Encounter (Signed)
Spoke with Gerald Stabs at [facility]. He said the smallest tablet they had was a 15 mg tab and that he would have to take 1/6 of a tablet. He said that patient's daughter had come by earlier and said that patient was stable and was fine with waiting until the order could arrive tomorrow with the strength/dosing that you ordered originally. So, they will stick with that unless something changes.

## 2017-01-11 NOTE — Telephone Encounter (Addendum)
Called and LMOVM.   Called back and talked with RN.   Patient on O2 at 3L with O2 sat 98%.   Wheeze on R side per RN.   Then our call got cut off.  Tried to call back mult times, on RN line and home phone.  Got RN back on phone.   We'll call in albuterol, RN to set up neb machine.   Will have morphine rx sent for air hunger as second line tx.   If not better, we can consider CXR in a day or two to see if cath needs to be repositioned.   Hospice to update me as needed.   App help of all involved.

## 2017-01-12 ENCOUNTER — Telehealth: Payer: Self-pay

## 2017-01-12 DIAGNOSIS — C7800 Secondary malignant neoplasm of unspecified lung: Secondary | ICD-10-CM | POA: Diagnosis not present

## 2017-01-12 DIAGNOSIS — N401 Enlarged prostate with lower urinary tract symptoms: Secondary | ICD-10-CM | POA: Diagnosis not present

## 2017-01-12 DIAGNOSIS — J910 Malignant pleural effusion: Secondary | ICD-10-CM | POA: Diagnosis not present

## 2017-01-12 DIAGNOSIS — R634 Abnormal weight loss: Secondary | ICD-10-CM | POA: Diagnosis not present

## 2017-01-12 DIAGNOSIS — C649 Malignant neoplasm of unspecified kidney, except renal pelvis: Secondary | ICD-10-CM | POA: Diagnosis not present

## 2017-01-12 DIAGNOSIS — R1310 Dysphagia, unspecified: Secondary | ICD-10-CM | POA: Diagnosis not present

## 2017-01-12 MED ORDER — TRAZODONE HCL 50 MG PO TABS: 25.0000 mg | ORAL_TABLET | Freq: Every evening | ORAL | 3 refills | Status: DC | PRN

## 2017-01-12 NOTE — Telephone Encounter (Signed)
Robin notified as instructed by telephone and verbalized understanding.

## 2017-01-12 NOTE — Telephone Encounter (Signed)
Robin nurse with [facility] left v/m; requesting med for sleep; pt is not resting at night. Also pt continues with lower extremities swelling inspite of being given lasix and potassium; Shirlean Mylar wants to know if Dr Damita Dunnings would order TED hose. Robin request cb. [place].

## 2017-01-12 NOTE — Telephone Encounter (Signed)
Please order TED hose, use as needed for swelling.  Okay to try trazodone 25-'50mg'$  po qhs prn insomnia.   rx sent.  Thanks.

## 2017-01-13 DIAGNOSIS — J910 Malignant pleural effusion: Secondary | ICD-10-CM | POA: Diagnosis not present

## 2017-01-13 DIAGNOSIS — C649 Malignant neoplasm of unspecified kidney, except renal pelvis: Secondary | ICD-10-CM | POA: Diagnosis not present

## 2017-01-13 DIAGNOSIS — N401 Enlarged prostate with lower urinary tract symptoms: Secondary | ICD-10-CM | POA: Diagnosis not present

## 2017-01-13 DIAGNOSIS — C7800 Secondary malignant neoplasm of unspecified lung: Secondary | ICD-10-CM | POA: Diagnosis not present

## 2017-01-13 DIAGNOSIS — R1310 Dysphagia, unspecified: Secondary | ICD-10-CM | POA: Diagnosis not present

## 2017-01-13 DIAGNOSIS — R634 Abnormal weight loss: Secondary | ICD-10-CM | POA: Diagnosis not present

## 2017-01-14 ENCOUNTER — Encounter: Admitting: Surgery

## 2017-01-14 DIAGNOSIS — J910 Malignant pleural effusion: Secondary | ICD-10-CM | POA: Diagnosis not present

## 2017-01-14 DIAGNOSIS — C649 Malignant neoplasm of unspecified kidney, except renal pelvis: Secondary | ICD-10-CM | POA: Diagnosis not present

## 2017-01-14 DIAGNOSIS — N401 Enlarged prostate with lower urinary tract symptoms: Secondary | ICD-10-CM | POA: Diagnosis not present

## 2017-01-14 DIAGNOSIS — Z09 Encounter for follow-up examination after completed treatment for conditions other than malignant neoplasm: Secondary | ICD-10-CM | POA: Diagnosis not present

## 2017-01-14 DIAGNOSIS — Z872 Personal history of diseases of the skin and subcutaneous tissue: Secondary | ICD-10-CM | POA: Diagnosis not present

## 2017-01-14 DIAGNOSIS — C7800 Secondary malignant neoplasm of unspecified lung: Secondary | ICD-10-CM | POA: Diagnosis not present

## 2017-01-14 DIAGNOSIS — R1310 Dysphagia, unspecified: Secondary | ICD-10-CM | POA: Diagnosis not present

## 2017-01-14 DIAGNOSIS — L97211 Non-pressure chronic ulcer of right calf limited to breakdown of skin: Secondary | ICD-10-CM | POA: Diagnosis not present

## 2017-01-14 DIAGNOSIS — R634 Abnormal weight loss: Secondary | ICD-10-CM | POA: Diagnosis not present

## 2017-01-15 DIAGNOSIS — C649 Malignant neoplasm of unspecified kidney, except renal pelvis: Secondary | ICD-10-CM | POA: Diagnosis not present

## 2017-01-15 DIAGNOSIS — R1310 Dysphagia, unspecified: Secondary | ICD-10-CM | POA: Diagnosis not present

## 2017-01-15 DIAGNOSIS — R634 Abnormal weight loss: Secondary | ICD-10-CM | POA: Diagnosis not present

## 2017-01-15 DIAGNOSIS — N401 Enlarged prostate with lower urinary tract symptoms: Secondary | ICD-10-CM | POA: Diagnosis not present

## 2017-01-15 DIAGNOSIS — C7800 Secondary malignant neoplasm of unspecified lung: Secondary | ICD-10-CM | POA: Diagnosis not present

## 2017-01-15 DIAGNOSIS — J910 Malignant pleural effusion: Secondary | ICD-10-CM | POA: Diagnosis not present

## 2017-01-15 NOTE — Progress Notes (Signed)
Hunter Hardin, Hunter Hardin ([id number]) Visit Report for 01/14/2017 Chief Complaint Document Details Patient Name: Hunter Hardin, Hunter Hardin. Date of Service: 01/14/2017 8:45 AM Medical Record Number: [id number] Patient Account Number: [id number] Date of Birth/Sex: 12-Jun-1935 (82 y.o. Male) Treating RN: Ahmed Prima Primary Care Provider: Elsie Stain Other Clinician: Referring Provider: Elsie Stain Treating Provider/Extender: Frann Rider in Treatment: 7 Information Obtained from: Patient Chief Complaint Patient presents to the wound care center for a evaluation of non healing wound to both lower extremities Electronic Signature(s) Signed: 01/14/2017 9:05:59 AM By: Christin Fudge MD, FACS Entered By: Christin Fudge on 01/14/2017 09:05:59 Hunter Hardin ([id number]) -------------------------------------------------------------------------------- HPI Details Patient Name: Hunter Hardin Date of Service: 01/14/2017 8:45 AM Medical Record Number: [id number] Patient Account Number: [id number] Date of Birth/Sex: 1935-03-18 (82 y.o. Male) Treating RN: Ahmed Prima Primary Care Provider: Elsie Stain Other Clinician: Referring Provider: Elsie Stain Treating Provider/Extender: Frann Rider in Treatment: 7 History of Present Illness Location: both lower extremities right worse than left Quality: Patient reports experiencing a dull pain to affected area(s). Severity: Patient states wound are getting worse. Duration: Patient has had the wound for < 3 weeks prior to presenting for treatment Timing: Pain in wound is Intermittent (comes and goes Context: The wound would happen gradually Modifying Factors: Other treatment(s) tried include:local ointments and local care Associated Signs and Symptoms: Patient reports having increase swelling. 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 Past medical history significant for essential hypertension, pleural effusion, colon cancer, hypothyroidism, renal cell cancer, hyperglycemia, thrombocytopenia, protein calorie malnutrition. the patient lives alone and has very little support and has a family friend who is helping him with his appointments. He has not seen a dermatologist for this condition recently. 12/03/2016 -- he continues to have a lot of pain and has not yet seen a dermatologist. 12-10-16 Hunter Hardin returns today for evaluation of his bilateral lower strumming ulcerations. He has not made an appointment with dermatology as yet. He is accompanied by his cousin, she has accompanied him to all of his appointments and is inquiring about the need for dermatology in light of his overall state of health. He recently had a thoracentesis (12/02/2016) with removal of "2 canisters". Hunter Hardin primary complaint is of pain to these ulcerations, he has been prescribed pain medication and takes that nightly. He is voicing no complaints regarding dressing changes or home health. 12/17/2016 -- right spoken to his PCP, Dr. Damita Dunnings regarding his pain management and sleep deprivation and Dr. Damita Dunnings was kind enough to take care of this. The patient has not yet seen dermatology. 12/24/2016 -- he did go to dermatology and though we do not have their notes his sister says that they put him on antibiotic which is not yet named and told him to continue with wound care as we have better equipped to treat him. 12/31/2016 -- the patient's daughter is here from [place] visiting and had several questions answered by me. The patient continues to be on hospice care, as a total drain placed for his pleural effusion and continues to be well monitored and treated by hospice Electronic  Signature(s) Signed: 01/14/2017 9:06:06 AM By: Christin Fudge MD, FACS Entered By: Christin Fudge on 01/14/2017 09:06:06 Hunter Hardin ([id number]) ADRIC, WREDE ([id number]) -------------------------------------------------------------------------------- Physical Exam  Details Patient Name: Hunter Hardin, Hunter Hardin. Date of Service: 01/14/2017 8:45 AM Medical Record Number: [id number] Patient Account Number: [id number] Date of Birth/Sex: 03/29/1935 (82 y.o. Male) Treating RN: Ahmed Prima Primary Care Provider: Elsie Stain Other Clinician: Referring Provider: Elsie Stain Treating Provider/Extender: Frann Rider in Treatment: 7 Constitutional . Pulse regular. Respirations normal and unlabored. Afebrile. . Eyes Nonicteric. Reactive to light. Ears, Nose, Mouth, and Throat Lips, teeth, and gums WNL.[name] Moist mucosa without lesions. Neck supple and nontender. No palpable supraclavicular or cervical adenopathy. Normal sized without goiter. Respiratory WNL. No retractions.. Breath sounds WNL, No rubs, rales, rhonchi, or wheeze.. Cardiovascular Heart rhythm and rate regular, no murmur or gallop.. Pedal Pulses WNL. No clubbing, cyanosis or edema. Chest Breasts symmetical and no nipple discharge.. Breast tissue WNL, no masses, lumps, or tenderness.. Lymphatic No adneopathy. No adenopathy. No adenopathy. Musculoskeletal Adexa without tenderness or enlargement.. Digits and nails w/o clubbing, cyanosis, infection, petechiae, ischemia, or inflammatory conditions.. Integumentary (Hair, Skin) No suspicious lesions. No crepitus or fluctuance. No peri-wound warmth or erythema. No masses.[name] Psychiatric Judgement and insight Intact.. No evidence of depression, anxiety, or agitation.. Notes wounds on the right lower extremity is completely healed. Electronic Signature(s) Signed: 01/14/2017 9:06:21 AM By: Christin Fudge MD, FACS Entered By: Christin Fudge on 01/14/2017 09:06:20 Hunter Hardin ([id number]) -------------------------------------------------------------------------------- Physician Orders Details Patient Name: Hunter Hardin, Hunter Hardin. Date of Service: 01/14/2017 8:45 AM Medical Record Number: [id number] Patient Account Number: [id number] Date of Birth/Sex: 1935-05-05 (82 y.o. Male) Treating RN: Ahmed Prima Primary Care Provider: Elsie Stain Other Clinician: Referring Provider: Elsie Stain Treating Provider/Extender: Frann Rider in Treatment: 7 Verbal / Phone Orders: Yes Clinician: Carolyne Fiscal, Debi Read Back and Verified: Yes Diagnosis Coding Discharge From [facility] Services o Discharge from [place] - Call our office if you have any questions or concerns. Continue [facility]. Electronic Signature(s) Signed: 01/14/2017 3:30:03 PM By: Christin Fudge MD, FACS Signed: 01/14/2017 3:37:28 PM By: Alric Quan Entered By: Alric Quan on 01/14/2017 09:20:41 Hunter Hardin ([id number]) -------------------------------------------------------------------------------- Problem List Details Patient Name: Hunter Hardin, Hunter Hardin. Date of Service: 01/14/2017 8:45 AM Medical Record Number: [id number] Patient Account Number: [id number] Date of Birth/Sex: June 21, 1935 (82 y.o. 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 11/26/2016 Yes breakdown of skin I89.0 Lymphedema, not elsewhere classified 11/26/2016 Yes E44.0 Moderate protein-calorie malnutrition 11/26/2016 Yes L13.9 Bullous disorder, unspecified 11/26/2016 Yes Z85.528 Personal history of other malignant neoplasm of kidney  11/26/2016 Yes Inactive Problems Resolved Problems Electronic Signature(s) Signed: 01/14/2017 9:05:51 AM By: Christin Fudge MD, FACS Entered By: Christin Fudge on 01/14/2017 09:05:50 Hunter Hardin ([id number]) -------------------------------------------------------------------------------- Progress Note Details Patient Name: Hunter Hardin Date of Service: 01/14/2017 8:45 AM Medical Record Number: [id number] Patient Account Number: [id number] Date of Birth/Sex: [birth year] (82 y.o. Male) Treating RN: Ahmed Prima Primary Care Provider: Elsie Stain Other Clinician: Referring Provider: Elsie Stain Treating Provider/Extender: Frann Rider in Treatment: 7 Subjective Chief Complaint Information obtained from Patient Patient presents to the wound care center for a evaluation of non healing wound to both lower extremities History of Present Illness (HPI) The following HPI elements were documented for the patient's wound: Location: both lower extremities right worse than left Quality: Patient reports experiencing a  dull pain to affected area(s). Severity: Patient states wound are getting worse. Duration: Patient has had the wound for < 3 weeks prior to presenting for treatment Timing: Pain in wound is Intermittent (comes and goes Context: The wound would happen gradually Modifying Factors: Other treatment(s) tried include:local ointments and local care Associated Signs and Symptoms: Patient reports having increase swelling. 82 year old patient with a history of colon cancer status post right hemicolectomy and stage IV renal cell carcinoma with metastasis to the lung and bone also has hypertension, chronic bilateral pleural effusion and chronic swelling in his legs which are now getting worse. Past medical history significant for essential hypertension, pleural effusion, colon cancer, hypothyroidism, renal cell cancer, hyperglycemia, thrombocytopenia, protein calorie  malnutrition. the patient lives alone and has very little support and has a family friend who is helping him with his appointments. He has not seen a dermatologist for this condition recently. 12/03/2016 -- he continues to have a lot of pain and has not yet seen a dermatologist. 12-10-16 Hunter Garfinkle returns today for evaluation of his bilateral lower strumming ulcerations. He has not made an appointment with dermatology as yet. He is accompanied by his cousin, she has accompanied him to all of his appointments and is inquiring about the need for dermatology in light of his overall state of health. He recently had a thoracentesis (12/02/2016) with removal of "2 canisters". Hunter. Steinke primary complaint is of pain to these ulcerations, he has been prescribed pain medication and takes that nightly. He is voicing no complaints regarding dressing changes or home health. 12/17/2016 -- right spoken to his PCP, Dr. Damita Dunnings regarding his pain management and sleep deprivation and Dr. Damita Dunnings was kind enough to take care of this. The patient has not yet seen dermatology. 12/24/2016 -- he did go to dermatology and though we do not have their notes his sister says that they put him on antibiotic which is not yet named and told him to continue with wound care as we have better equipped to treat him. 12/31/2016 -- the patient's daughter is here from [place] visiting and had several questions answered by Hunter Hardin ([id number]) me. The patient continues to be on hospice care, as a total drain placed for his pleural effusion and continues to be well monitored and treated by hospice Objective Constitutional Pulse regular. Respirations normal and unlabored. Afebrile. Vitals Time Taken: 8:47 AM, Height: 67 in, Weight: 138 lbs, BMI: 21.6, Temperature: 97.7 F, Pulse: 91 bpm, Respiratory Rate: 16 breaths/min, Blood Pressure: 116/53 mmHg. Eyes Nonicteric. Reactive to light. Ears, Nose, Mouth, and  Throat Lips, teeth, and gums WNL.[name] Moist mucosa without lesions. Neck supple and nontender. No palpable supraclavicular or cervical adenopathy. Normal sized without goiter. Respiratory WNL. No retractions.. Breath sounds WNL, No rubs, rales, rhonchi, or wheeze.. Cardiovascular Heart rhythm and rate regular, no murmur or gallop.. Pedal Pulses WNL. No clubbing, cyanosis or edema. Chest Breasts symmetical and no nipple discharge.. Breast tissue WNL, no masses, lumps, or tenderness.. Lymphatic No adneopathy. No adenopathy. No adenopathy. Musculoskeletal Adexa without tenderness or enlargement.. Digits and nails w/o clubbing, cyanosis, infection, petechiae, ischemia, or inflammatory conditions.[name] Psychiatric Judgement and insight Intact.. No evidence of depression, anxiety, or agitation.. General Notes: wounds on the right lower extremity is completely healed. Integumentary (Hair, Skin) No suspicious lesions. No crepitus or fluctuance. No peri-wound warmth or erythema. No masses.Hunter Hardin, Hunter Hardin ([id number]) Wound #1 status is Healed - Epithelialized. Original cause of wound was Gradually Appeared. The wound is located on  the Right,Circumferential Lower Leg. The wound measures 0cm length x 0cm width x 0cm depth; 0cm^2 area and 0cm^3 volume. There is no tunneling or undermining noted. There is a none present amount of drainage noted. There is no granulation within the wound bed. There is no necrotic tissue within the wound bed. 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 Continue [facility]. His wounds are healed and at this stage I have recommended that he continue with mild compression stockings of the 15-20 mm variety and elevate his legs as much as possible. He is on hospice care and I have given them general advice regarding offloading, nutrition and he is discharged from the wound care services and will be seen back only as needed Electronic Signature(s) Signed: 01/14/2017 3:35:26 PM By: Christin Fudge MD, FACS Previous Signature: 01/14/2017 9:07:12 AM Version By: Christin Fudge MD, FACS Entered By: Christin Fudge on 01/14/2017 15:35:26 Hunter Hardin ([id number]) Hunter Hardin, Hunter Hardin ([id number]) -------------------------------------------------------------------------------- SuperBill Details Patient Name: Hunter Hardin Date of Service: 01/14/2017 Medical Record Number: [id number] Patient Account Number: [id number] Date of Birth/Sex: 04/28/1935 (81 y.o. 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 of skin I89.0 Lymphedema, not elsewhere classified E44.0 Moderate protein-calorie malnutrition L13.9 Bullous disorder, unspecified Z85.528 Personal history of other malignant neoplasm of kidney Facility Procedures CPT4 Code: [id number] Description: [phone] - WOUND CARE VISIT-LEV 2 EST PT Modifier: Quantity: 1 Physician Procedures CPT4 Code Description: [id number] - WC PHYS LEVEL 2 - EST PT ICD-10 Description Diagnosis L97.211 Non-pressure chronic ulcer of right calf limited to 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

## 2017-01-15 NOTE — Progress Notes (Signed)
LUTHER, SPRINGS ([id number]) Visit Report for 01/14/2017 Arrival Information Details Patient Name: Hunter Hardin, Hunter Hardin. Date of Service: 01/14/2017 8:45 AM Medical Record Number: [id number] Patient Account Number: [id number] Date of Birth/Sex: December 12, 1935 (82 y.o. Male) Treating RN: Ahmed Prima Primary Care Jolanta Cabeza: Elsie Stain Other Clinician: Referring Wilgus Deyton: Elsie Stain Treating Henri Guedes/Extender: Frann Rider in Treatment: 7 Visit Information History Since Last Visit All ordered tests and consults were completed: No Patient Arrived: Wheel Chair Added or deleted any medications: No Arrival Time: 08:44 Any new allergies or adverse reactions: No Accompanied By: daughter Had a fall or experienced change in No activities of daily living that may affect Transfer Assistance: None risk of falls: Patient Identification Verified: Yes Signs or symptoms of abuse/neglect since last No Secondary Verification Process Yes visito Completed: Hospitalized since last visit: No Patient Requires Transmission-Based No Has Dressing in Place as Prescribed: Yes Precautions: Pain Present Now: No Patient Has Alerts: Yes Electronic Signature(s) Signed: 01/14/2017 3:37:28 PM By: Alric Quan Entered By: Alric Quan on 01/14/2017 08:47:10 Hunter Hardin ([id number]) -------------------------------------------------------------------------------- Clinic Level of Care Assessment Details Patient Name: Hunter Hardin. Date of Service: 01/14/2017 8:45 AM Medical Record Number: [id number] Patient Account Number: [id number] Date of Birth/Sex: Jun 02, 1935 (82 y.o. Male) Treating RN: Ahmed Prima Primary Care Lainie Daubert: Elsie Stain Other Clinician: Referring Eligah Anello: Elsie Stain Treating Graylon Amory/Extender: Frann Rider in Treatment: 7 Clinic Level of Care Assessment Items TOOL 4 Quantity Score X - Use when only an EandM is performed on FOLLOW-UP visit 1  0 ASSESSMENTS - Nursing Assessment / Reassessment '[]'$  - Reassessment of Co-morbidities (includes updates in patient status) 0 '[]'$  - Reassessment of Adherence to Treatment Plan 0 ASSESSMENTS - Wound and Skin Assessment / Reassessment X - Simple Wound Assessment / Reassessment - one wound 1 5 '[]'$  - Complex Wound Assessment / Reassessment - multiple wounds 0 '[]'$  - Dermatologic / Skin Assessment (not related to wound area) 0 ASSESSMENTS - Focused Assessment '[]'$  - Circumferential Edema Measurements - multi extremities 0 '[]'$  - Nutritional Assessment / Counseling / Intervention 0 '[]'$  - Lower Extremity Assessment (monofilament, tuning fork, pulses) 0 '[]'$  - Peripheral Arterial Disease Assessment (using hand held doppler) 0 ASSESSMENTS - Ostomy and/or Continence Assessment and Care '[]'$  - Incontinence Assessment and Management 0 '[]'$  - Ostomy Care Assessment and Management (repouching, etc.) 0 PROCESS - Coordination of Care '[]'$  - Simple Patient / Family Education for ongoing care 0 X - Complex (extensive) Patient / Family Education for ongoing care 1 20 '[]'$  - Staff obtains Programmer, systems, Records, Test Results / Process Orders 0 X - Staff telephones HHA, Nursing Homes / Clarify orders / etc 1 10 '[]'$  - Routine Transfer to another Facility (non-emergent condition) 0 Hunter Hardin, Hunter Hardin ([id number]) '[]'$  - Routine Hospital Admission (non-emergent condition) 0 '[]'$  - New Admissions / Biomedical engineer / Ordering NPWT, Apligraf, etc. 0 '[]'$  - Emergency Hospital Admission (emergent condition) 0 X - Simple Discharge Coordination 1 10 '[]'$  - Complex (extensive) Discharge Coordination 0 PROCESS - Special Needs '[]'$  - Pediatric / Minor Patient Management 0 '[]'$  - Isolation Patient Management 0 '[]'$  - Hearing / Language / Visual special needs 0 '[]'$  - Assessment of Community assistance (transportation, D/C planning, etc.) 0 '[]'$  - Additional assistance / Altered mentation 0 '[]'$  - Support Surface(s) Assessment (bed, cushion, seat, etc.)  0 INTERVENTIONS - Wound Cleansing / Measurement '[]'$  - Simple Wound Cleansing - one wound 0 '[]'$  - Complex Wound Cleansing - multiple wounds 0 X - Wound Imaging (photographs - any  number of wounds) 1 5 '[]'$  - Wound Tracing (instead of photographs) 0 '[]'$  - Simple Wound Measurement - one wound 0 '[]'$  - Complex Wound Measurement - multiple wounds 0 INTERVENTIONS - Wound Dressings '[]'$  - Small Wound Dressing one or multiple wounds 0 '[]'$  - Medium Wound Dressing one or multiple wounds 0 '[]'$  - Large Wound Dressing one or multiple wounds 0 '[]'$  - Application of Medications - topical 0 '[]'$  - Application of Medications - injection 0 INTERVENTIONS - Miscellaneous '[]'$  - External ear exam 0 Hunter Hardin, Hunter Hardin ([id number]) '[]'$  - Specimen Collection (cultures, biopsies, blood, body fluids, etc.) 0 '[]'$  - Specimen(s) / Culture(s) sent or taken to Lab for analysis 0 '[]'$  - Patient Transfer (multiple staff / Harrel Lemon Lift / Similar devices) 0 '[]'$  - Simple Staple / Suture removal (25 or less) 0 '[]'$  - Complex Staple / Suture removal (26 or more) 0 '[]'$  - Hypo / Hyperglycemic Management (close monitor of Blood Glucose) 0 '[]'$  - Ankle / Brachial Index (ABI) - do not check if billed separately 0 X - Vital Signs 1 5 Has the patient been seen at the hospital within the last three years: Yes Total Score: 55 Level Of Care: New/Established - Level 2 Electronic Signature(s) Signed: 01/14/2017 3:37:28 PM By: Alric Quan Entered By: Alric Quan on 01/14/2017 11:02:23 Hunter Hardin ([id number]) -------------------------------------------------------------------------------- Encounter Discharge Information Details Patient Name: Hunter Hardin. Date of Service: 01/14/2017 8:45 AM Medical Record Number: [id number] Patient Account Number: [id number] Date of Birth/Sex: Dec 28, 1934 (82 y.o. Male) Treating RN: Ahmed Prima Primary Care Dayani Winbush: Elsie Stain Other Clinician: Referring Nickolaus Bordelon: Elsie Stain Treating  Ceairra Mccarver/Extender: Frann Rider in Treatment: 7 Encounter Discharge Information Items Discharge Pain Level: 0 Discharge Condition: Stable Ambulatory Status: Wheelchair Discharge Destination: Home Transportation: Private Auto Accompanied By: daughter Schedule Follow-up Appointment: No Medication Reconciliation completed and provided to Patient/Care Yes Tyleigh Mahn: Provided on Clinical Summary of Care: 01/14/2017 Form Type Recipient Paper Patient JS Electronic Signature(s) Signed: 01/14/2017 9:09:55 AM By: Ruthine Dose Entered By: Ruthine Dose on 01/14/2017 09:09:55 Hunter Hardin ([id number]) -------------------------------------------------------------------------------- Lower Extremity Assessment Details Patient Name: Hunter Hardin Date of Service: 01/14/2017 8:45 AM Medical Record Number: [id number] Patient Account Number: [id number] Date of Birth/Sex: Nov 09, 1935 (82 y.o. Male) Treating RN: Ahmed Prima Primary Care Deandre Stansel: Elsie Stain Other Clinician: Referring Kathlene Yano: Elsie Stain Treating Reginal Wojcicki/Extender: Frann Rider in Treatment: 7 Vascular Assessment Pulses: Posterior Tibial Doppler Audible: [Right:Yes] Extremity colors, hair growth, and conditions: Extremity Color: [Right:Hyperpigmented] Temperature of Extremity: [Right:Warm] Capillary Refill: [Right:< 3 seconds] Toe Nail Assessment Left: Right: Thick: No Discolored: No Deformed: No Improper Length and Hygiene: No Electronic Signature(s) Signed: 01/14/2017 3:37:28 PM By: Alric Quan Entered By: Alric Quan on 01/14/2017 09:04:15 Hunter Hardin ([id number]) -------------------------------------------------------------------------------- Multi Wound Chart Details Patient Name: Hunter Hardin. Date of Service: 01/14/2017 8:45 AM Medical Record Number: [id number] Patient Account Number: [id number] Date of Birth/Sex: 04/27/1935 (82 y.o. Male) Treating RN:  Ahmed Prima Primary Care Deasia Chiu: Elsie Stain Other Clinician: Referring Sandara Tyree: Elsie Stain Treating Larisa Lanius/Extender: Frann Rider in Treatment: 7 Vital Signs Height(in): 67 Pulse(bpm): 91 Weight(lbs): 138 Blood Pressure 116/53 (mmHg): Body Mass Index(BMI): 22 Temperature(F): 97.7 Respiratory Rate 16 (breaths/min): Photos: [1:No Photos] [N/A:N/A] Wound Location: [1:Right Lower Leg - Circumfernential] [N/A:N/A] Wounding Event: [1:Gradually Appeared] [N/A:N/A] Primary Etiology: [1:Venous Leg Ulcer] [N/A:N/A] Comorbid History: [1:Osteoarthritis, Received Chemotherapy] [N/A:N/A] Date Acquired: [1:11/09/2016] [N/A:N/A] Weeks of Treatment: [1:7] [N/A:N/A] Wound Status: [1:Healed - Epithelialized] [N/A:N/A] Clustered Wound: [1:Yes] [N/A:N/A] Measurements L x W x  D 0x0x0 [N/A:N/A] (cm) Area (cm) : [1:0] [N/A:N/A] Volume (cm) : [1:0] [N/A:N/A] % Reduction in Area: [1:100.00%] [N/A:N/A] % Reduction in Volume: 100.00% [N/A:N/A] Classification: [1:Partial Thickness] [N/A:N/A] Exudate Amount: [1:None Present] [N/A:N/A] Granulation Amount: [1:None Present (0%)] [N/A:N/A] Necrotic Amount: [1:None Present (0%)] [N/A:N/A] Epithelialization: [1:Large (67-100%)] [N/A:N/A] Periwound Skin Texture: No Abnormalities Noted [N/A:N/A] Periwound Skin [1:No Abnormalities Noted] [N/A:N/A] Moisture: Periwound Skin Color: No Abnormalities Noted [N/A:N/A] Tenderness on [1:No] [N/A:N/A] Palpation: Wound Preparation: [1:Ulcer Cleansing: Not Cleansed] [N/A:N/A] Topical Anesthetic Applied: None Treatment Notes Electronic Signature(s) Signed: 01/14/2017 9:05:54 AM By: Christin Fudge MD, FACS Entered By: Christin Fudge on 01/14/2017 09:05:54 Hunter Hardin ([id number]) -------------------------------------------------------------------------------- [place] Details Patient Name: Hunter Hardin. Date of Service: 01/14/2017 8:45 AM Medical Record  Number: [id number] Patient Account Number: [id number] Date of Birth/Sex: 10-Feb-1935 (82 y.o. Male) Treating RN: Ahmed Prima Primary Care Calen Geister: Elsie Stain Other Clinician: Referring Sherrin Stahle: Elsie Stain Treating Breanna Mcdaniel/Extender: Frann Rider in Treatment: 7 Active Inactive Electronic Signature(s) Signed: 01/14/2017 3:37:28 PM By: Alric Quan Entered By: Alric Quan on 01/14/2017 09:04:41 Hunter Hardin ([id number]) -------------------------------------------------------------------------------- Pain Assessment Details Patient Name: Hunter Hardin Date of Service: 01/14/2017 8:45 AM Medical Record Number: [id number] Patient Account Number: [id number] Date of Birth/Sex: 1935-02-05 (82 y.o. Male) Treating RN: Ahmed Prima Primary Care Maurita Havener: Elsie Stain Other Clinician: Referring Odus Clasby: Elsie Stain Treating Jacquese Cassarino/Extender: Frann Rider in Treatment: 7 Active Problems Location of Pain Severity and Description of Pain Patient Has Paino No Site Locations With Dressing Change: No Pain Management and Medication Current Pain Management: Electronic Signature(s) Signed: 01/14/2017 3:37:28 PM By: Alric Quan Entered By: Alric Quan on 01/14/2017 08:47:16 Hunter Hardin ([id number]) -------------------------------------------------------------------------------- Patient/Caregiver Education Details Patient Name: Hunter Hardin Date of Service: 01/14/2017 8:45 AM Medical Record Number: [id number] Patient Account Number: [id number] Date of Birth/Gender: 1935-01-30 (82 y.o. Male) Treating RN: Ahmed Prima Primary Care Physician: Elsie Stain Other Clinician: Referring Physician: Elsie Stain Treating Physician/Extender: Frann Rider in Treatment: 7 Education Assessment Education Provided To: Patient Education Topics Provided Wound/Skin Impairment: Handouts: Other: Call our office if you have  any questions or concerns. Methods: Explain/Verbal Responses: State content correctly Electronic Signature(s) Signed: 01/14/2017 3:37:28 PM By: Alric Quan Entered By: Alric Quan on 01/14/2017 09:07:50 Hunter Hardin ([id number]) -------------------------------------------------------------------------------- Wound Assessment Details Patient Name: Hunter Hardin Date of Service: 01/14/2017 8:45 AM Medical Record Number: [id number] Patient Account Number: [id number] Date of Birth/Sex: [birth year] (82 y.o. Male) Treating RN: Ahmed Prima Primary Care Eliese Kerwood: Elsie Stain Other Clinician: Referring Trinady Milewski: Elsie Stain Treating Rosalena Mccorry/Extender: Frann Rider in Treatment: 7 Wound Status Wound Number: 1 Primary Venous Leg Ulcer Etiology: Wound Location: Right Lower Leg - Circumfernential Wound Status: Healed - Epithelialized Wounding Event: Gradually Appeared Comorbid Osteoarthritis, Received History: Chemotherapy Date Acquired: 11/09/2016 Weeks Of Treatment: 7 Clustered Wound: Yes Photos Photo Uploaded By: Alric Quan on 01/14/2017 13:03:21 Wound Measurements Length: (cm) 0 % Reduction in Width: (cm) 0 % Reduction in Depth: (cm) 0 Epithelializat Area: (cm) 0 Tunneling: Volume: (cm) 0 Undermining: Area: 100% Volume: 100% ion: Large (67-100%) No No Wound Description Classification: Partial Thickness Exudate Amount: None Present Foul Odor After Cleansing: No Slough/Fibrino No Wound Bed Granulation Amount: None Present (0%) Necrotic Amount: None Present (0%) Periwound Skin Texture Texture Color No Abnormalities Noted: No No Abnormalities Noted: No Hunter Hardin, Hunter Hardin ([id number]) Moisture No Abnormalities Noted: No Wound Preparation Ulcer Cleansing: Not Cleansed Topical Anesthetic Applied: None Electronic Signature(s) Signed: 01/14/2017 3:37:28 PM By: Alric Quan Entered  By: Alric Quan on 01/14/2017  09:03:52 Hunter Hardin ([id number]) -------------------------------------------------------------------------------- [place] Details Patient Name: Hunter Hardin Date of Service: 01/14/2017 8:45 AM Medical Record Number: [id number] Patient Account Number: [id number] Date of Birth/Sex: Oct 04, 1935 (82 y.o. Male) Treating RN: Ahmed Prima Primary Care Nolyn Eilert: Elsie Stain Other Clinician: Referring Yitzchok Carriger: Elsie Stain Treating Maziah Keeling/Extender: Frann Rider in Treatment: 7 Vital Signs Time Taken: 08:47 Temperature (F): 97.7 Height (in): 67 Pulse (bpm): 91 Weight (lbs): 138 Respiratory Rate (breaths/min): 16 Body Mass Index (BMI): 21.6 Blood Pressure (mmHg): 116/53 Reference Range: 80 - 120 mg / dl Electronic Signature(s) Signed: 01/14/2017 3:37:28 PM By: Alric Quan Entered By: Alric Quan on 01/14/2017 08:47:36

## 2017-01-18 DIAGNOSIS — R634 Abnormal weight loss: Secondary | ICD-10-CM | POA: Diagnosis not present

## 2017-01-18 DIAGNOSIS — N401 Enlarged prostate with lower urinary tract symptoms: Secondary | ICD-10-CM | POA: Diagnosis not present

## 2017-01-18 DIAGNOSIS — C7800 Secondary malignant neoplasm of unspecified lung: Secondary | ICD-10-CM | POA: Diagnosis not present

## 2017-01-18 DIAGNOSIS — C649 Malignant neoplasm of unspecified kidney, except renal pelvis: Secondary | ICD-10-CM | POA: Diagnosis not present

## 2017-01-18 DIAGNOSIS — R1310 Dysphagia, unspecified: Secondary | ICD-10-CM | POA: Diagnosis not present

## 2017-01-18 DIAGNOSIS — J910 Malignant pleural effusion: Secondary | ICD-10-CM | POA: Diagnosis not present

## 2017-01-18 MED ORDER — TRAZODONE HCL 50 MG PO TABS: 50.0000 mg | ORAL_TABLET | Freq: Every evening | ORAL | Status: DC | PRN

## 2017-01-18 NOTE — Telephone Encounter (Signed)
I would try 2 trazodone, try to elevated feet and still finish the abx.  Med list updated.  Thanks.

## 2017-01-18 NOTE — Telephone Encounter (Signed)
Hospice nurse [place] notified as instructed by telephone and verbalized understanding. Patient's son Nicki Reaper notified as instructed by telephone and verbalized understanding.

## 2017-01-18 NOTE — Telephone Encounter (Signed)
Hunter Hardin pts daughter (do not see signed DPR for Hunter Hardin) left v/m wanting to know if pt could take 2 tabs of trazodone. Taking the 1 tab of trazodone is not helping sleep at all. If pt cannot take 2 tabs of trazodone request substitute med for insomnia. Pt is not getting any sleep. pts legs are still swollen and wants to know if should continue abx. Will finish current abx 01/19/17. Hunter Hardin request cb. [place].

## 2017-01-18 NOTE — Addendum Note (Signed)
Addended by: Tonia Ghent on: 01/18/2017 01:34 PM   Modules accepted: Orders

## 2017-01-19 DIAGNOSIS — R1310 Dysphagia, unspecified: Secondary | ICD-10-CM | POA: Diagnosis not present

## 2017-01-19 DIAGNOSIS — N401 Enlarged prostate with lower urinary tract symptoms: Secondary | ICD-10-CM | POA: Diagnosis not present

## 2017-01-19 DIAGNOSIS — R634 Abnormal weight loss: Secondary | ICD-10-CM | POA: Diagnosis not present

## 2017-01-19 DIAGNOSIS — J910 Malignant pleural effusion: Secondary | ICD-10-CM | POA: Diagnosis not present

## 2017-01-19 DIAGNOSIS — C649 Malignant neoplasm of unspecified kidney, except renal pelvis: Secondary | ICD-10-CM | POA: Diagnosis not present

## 2017-01-19 DIAGNOSIS — C7800 Secondary malignant neoplasm of unspecified lung: Secondary | ICD-10-CM | POA: Diagnosis not present

## 2017-01-20 DIAGNOSIS — N401 Enlarged prostate with lower urinary tract symptoms: Secondary | ICD-10-CM | POA: Diagnosis not present

## 2017-01-20 DIAGNOSIS — R1310 Dysphagia, unspecified: Secondary | ICD-10-CM | POA: Diagnosis not present

## 2017-01-20 DIAGNOSIS — R634 Abnormal weight loss: Secondary | ICD-10-CM | POA: Diagnosis not present

## 2017-01-20 DIAGNOSIS — J910 Malignant pleural effusion: Secondary | ICD-10-CM | POA: Diagnosis not present

## 2017-01-20 DIAGNOSIS — C7800 Secondary malignant neoplasm of unspecified lung: Secondary | ICD-10-CM | POA: Diagnosis not present

## 2017-01-20 DIAGNOSIS — C649 Malignant neoplasm of unspecified kidney, except renal pelvis: Secondary | ICD-10-CM | POA: Diagnosis not present

## 2017-01-21 ENCOUNTER — Telehealth: Payer: Self-pay

## 2017-01-21 DIAGNOSIS — E039 Hypothyroidism, unspecified: Secondary | ICD-10-CM | POA: Diagnosis not present

## 2017-01-21 DIAGNOSIS — K219 Gastro-esophageal reflux disease without esophagitis: Secondary | ICD-10-CM | POA: Diagnosis not present

## 2017-01-21 DIAGNOSIS — R634 Abnormal weight loss: Secondary | ICD-10-CM | POA: Diagnosis not present

## 2017-01-21 DIAGNOSIS — C649 Malignant neoplasm of unspecified kidney, except renal pelvis: Secondary | ICD-10-CM | POA: Diagnosis not present

## 2017-01-21 DIAGNOSIS — N401 Enlarged prostate with lower urinary tract symptoms: Secondary | ICD-10-CM | POA: Diagnosis not present

## 2017-01-21 DIAGNOSIS — C189 Malignant neoplasm of colon, unspecified: Secondary | ICD-10-CM | POA: Diagnosis not present

## 2017-01-21 DIAGNOSIS — I10 Essential (primary) hypertension: Secondary | ICD-10-CM | POA: Diagnosis not present

## 2017-01-21 DIAGNOSIS — R630 Anorexia: Secondary | ICD-10-CM | POA: Diagnosis not present

## 2017-01-21 DIAGNOSIS — J910 Malignant pleural effusion: Secondary | ICD-10-CM | POA: Diagnosis not present

## 2017-01-21 DIAGNOSIS — E785 Hyperlipidemia, unspecified: Secondary | ICD-10-CM | POA: Diagnosis not present

## 2017-01-21 DIAGNOSIS — R1310 Dysphagia, unspecified: Secondary | ICD-10-CM | POA: Diagnosis not present

## 2017-01-21 DIAGNOSIS — C7800 Secondary malignant neoplasm of unspecified lung: Secondary | ICD-10-CM | POA: Diagnosis not present

## 2017-01-21 MED ORDER — LORAZEPAM 0.5 MG PO TABS
0.5000 mg | ORAL_TABLET | Freq: Every day | ORAL | 0 refills | Status: DC
Start: 2017-01-21 — End: 2017-01-26

## 2017-01-21 MED ORDER — AMOXICILLIN-POT CLAVULANATE 875-125 MG PO TABS: 1.0000 | ORAL_TABLET | Freq: Two times a day (BID) | ORAL | 0 refills | Status: DC

## 2017-01-21 NOTE — Telephone Encounter (Signed)
Noted, thanks.  Please have them update me in the next few days about drainage, fevers, etc.  Thanks.

## 2017-01-21 NOTE — Telephone Encounter (Signed)
Robin nurse with Hospice is at pts home; pt complaining with pain at site of insertion of pleurix catheter. Pt is not uncomfortable or SOB. No fever, no redness,swelling or tenderness at site. Cath has not been drained in 10 days. Robin changed dressing and large amt of dark green drainage with odor. There is also green drainage in clamped off tubing.  Pt is presently taking Keflex for ulcers on legs. The Trazodone 50 mg taking 2 tabs at hs is not helping pt to sleep. And finally pt has prod cough with yellow phlegm. [place]. Dr Damita Dunnings said will probably need to change abx, and tube may need to come out but Dr Damita Dunnings is going to contact Dr Alen Blew first and will call Robin back . Robin voiced understanding.

## 2017-01-21 NOTE — Telephone Encounter (Signed)
Robin with Hospice notified as instructed by telephone and verbalized understanding. Rx called to pharmacy as instructed.

## 2017-01-21 NOTE — Telephone Encounter (Signed)
Robin with [facility] left v/m; Robin drained pleurix catheter and there was greenish drainage of  5cc  that made it to the bottle; so virtually no drainage. Shirlean Mylar is not sure if tube is stopped up or what is issue. No redness or tenderness. Did pick up augmentin and lorazepam. FYI to Dr Damita Dunnings.

## 2017-01-21 NOTE — Telephone Encounter (Signed)
Please call hospice back:  I called and talked to Dr. Alen Blew.  We agreed on the following-  Gently try to pull fluid from the tube (to remove some fluid if possible and see if the tube is still functional). Change keflex to augmentin.  Rx sent, stop keflex.  If the drainage continues or more redness or pain locally, then we'll have to have the patient set up with IR at the hospital for removal.   If this is a superficial issue then the abx may help and having the tube in place may still be useful in the future.   Would try low dose of lorazepam at night instead of the trazodone for sleep.  Please call in lorazepam.   Thanks.

## 2017-01-22 DIAGNOSIS — C649 Malignant neoplasm of unspecified kidney, except renal pelvis: Secondary | ICD-10-CM | POA: Diagnosis not present

## 2017-01-22 DIAGNOSIS — C7800 Secondary malignant neoplasm of unspecified lung: Secondary | ICD-10-CM | POA: Diagnosis not present

## 2017-01-22 DIAGNOSIS — J910 Malignant pleural effusion: Secondary | ICD-10-CM | POA: Diagnosis not present

## 2017-01-22 DIAGNOSIS — N401 Enlarged prostate with lower urinary tract symptoms: Secondary | ICD-10-CM | POA: Diagnosis not present

## 2017-01-22 DIAGNOSIS — R634 Abnormal weight loss: Secondary | ICD-10-CM | POA: Diagnosis not present

## 2017-01-22 DIAGNOSIS — R1310 Dysphagia, unspecified: Secondary | ICD-10-CM | POA: Diagnosis not present

## 2017-01-22 NOTE — Telephone Encounter (Signed)
Robin notified as instructed by telephone and verbalized understanding.

## 2017-01-22 NOTE — Telephone Encounter (Signed)
Left message on voicemail for Hunter Hardin to call back.

## 2017-01-24 NOTE — Assessment & Plan Note (Addendum)
Given Zofran for nausea in office today. Keep appt with oncology for follow up.

## 2017-01-24 NOTE — Assessment & Plan Note (Signed)
Start low dose lasix 20 mg daily until appt with Dr. Clement Husbands at least. Stop HCTZ.  Follow wts at home.  Elevated legs as able. Stop at front desk on way out for referral to wound care center.

## 2017-01-24 NOTE — Assessment & Plan Note (Signed)
Refer to wound care center.

## 2017-01-25 DIAGNOSIS — C649 Malignant neoplasm of unspecified kidney, except renal pelvis: Secondary | ICD-10-CM | POA: Diagnosis not present

## 2017-01-25 DIAGNOSIS — R634 Abnormal weight loss: Secondary | ICD-10-CM | POA: Diagnosis not present

## 2017-01-25 DIAGNOSIS — C7800 Secondary malignant neoplasm of unspecified lung: Secondary | ICD-10-CM | POA: Diagnosis not present

## 2017-01-25 DIAGNOSIS — N401 Enlarged prostate with lower urinary tract symptoms: Secondary | ICD-10-CM | POA: Diagnosis not present

## 2017-01-25 DIAGNOSIS — J910 Malignant pleural effusion: Secondary | ICD-10-CM | POA: Diagnosis not present

## 2017-01-25 DIAGNOSIS — R1310 Dysphagia, unspecified: Secondary | ICD-10-CM | POA: Diagnosis not present

## 2017-01-26 ENCOUNTER — Telehealth: Payer: Self-pay

## 2017-01-26 MED ORDER — LORAZEPAM 0.5 MG PO TABS: 0.5000 mg | ORAL_TABLET | Freq: Every day | ORAL | 0 refills | Status: DC

## 2017-01-26 NOTE — Telephone Encounter (Signed)
Robin nurse with Hospice said that the information that she has already given to Dr Damita Dunnings was relayed by the hospice aide. Hunter Hardin spoke with pts son and the information already given is not correct. Hunter Hardin said pt's son said that pt takes trazodone and then 45 mins later gives pt the lorazepam Hunter Hardin is not sure what dosage pt is getting of either med). I called and spoke with pts son,Hunter Hardin and he said pt is taking  Lorazepam 0.5 mg taking 1 pill and also taking tramadol 50 mg about 30 mins after taking the lorazepam. One night pts son gave lorazepam but no tramadol and pt really did not sleep well so pts son went back to giving him  Lorazepam and Tramadol 30 mins after Lorazepam.  But pt's son said now pt wants to sleep all day. And when pt is awake he is very weak with no energy. Pts son said pt had not eaten anything today but does drink water or tea when takes meds. Pt has not takenTrazodone since 01/20/17. Robin request cb. [place]

## 2017-01-26 NOTE — Telephone Encounter (Signed)
Robin notified as instructed and Shirlean Mylar voiced understanding.

## 2017-01-26 NOTE — Telephone Encounter (Signed)
Robin nurse with [facility] left v/m; pt is still not sleeping at night; Trazodone was increased to 3 tabs last week. Shirlean Mylar wants to know if can change to different med to [facility]. Robin request cb.

## 2017-01-26 NOTE — Telephone Encounter (Signed)
Stop trazodone, change lorazepam to 0.5-1 mg qhs prn insomnia and see if that helps.  Please call in rx if needed in the meantime.  Thanks.

## 2017-01-26 NOTE — Telephone Encounter (Signed)
I would try only taking 2 tabs of lorazepam at night to see if that helps with sleep at night; that may help reset his sleep cycle to allow for more time awake during the day.

## 2017-01-26 NOTE — Telephone Encounter (Signed)
Robin with hospice called back and pt is taking lorazepam Shirlean Mylar is not sure if taking 1 or 2 tabs and trazodone 3 tabs at hs).

## 2017-01-26 NOTE — Telephone Encounter (Signed)
Please see below.

## 2017-01-26 NOTE — Telephone Encounter (Signed)
Hunter Hardin at [facility] stated that she was under the impression that lorazepam wasn't helping patient sleep as well.  She is on the way to patient's home now and I asked her to CB to give [place] more clarification on what exactly he is taking QHS.

## 2017-01-26 NOTE — Telephone Encounter (Signed)
If he is taking 1 tab lorazepam at night, then inc to 2.  If already taking 2, then increase to 3 tabs qhs. Thanks.

## 2017-01-27 DIAGNOSIS — N401 Enlarged prostate with lower urinary tract symptoms: Secondary | ICD-10-CM | POA: Diagnosis not present

## 2017-01-27 DIAGNOSIS — C7800 Secondary malignant neoplasm of unspecified lung: Secondary | ICD-10-CM | POA: Diagnosis not present

## 2017-01-27 DIAGNOSIS — R1310 Dysphagia, unspecified: Secondary | ICD-10-CM | POA: Diagnosis not present

## 2017-01-27 DIAGNOSIS — C649 Malignant neoplasm of unspecified kidney, except renal pelvis: Secondary | ICD-10-CM | POA: Diagnosis not present

## 2017-01-27 DIAGNOSIS — J910 Malignant pleural effusion: Secondary | ICD-10-CM | POA: Diagnosis not present

## 2017-01-27 DIAGNOSIS — R634 Abnormal weight loss: Secondary | ICD-10-CM | POA: Diagnosis not present

## 2017-01-27 NOTE — Telephone Encounter (Signed)
Robin with Hospice called and would like couple of issues addressed. Shirlean Mylar wants to know if pt has to stop tramadol completely or just at hs. Charma Igo of Dr Josefine Class instruction and Shirlean Mylar voiced understanding. Also Shirlean Mylar said pt appetite has decreased a lot and wants to know if any of pts meds might be causing decreased appetite. pts daughter would like Dr Damita Dunnings to review med list to see if any of pts meds could be stopped that are not necessary for pt to take now.  Robin request cb.

## 2017-01-27 NOTE — Telephone Encounter (Signed)
Left message on voicemail for Robin to call back.

## 2017-01-27 NOTE — Telephone Encounter (Signed)
I wouldn't suspect any meds to dec appetite other than the lorazepam, loperamide, and tramadol.   I would still use lorazepam for sleep.  Okay to stop loperamide or only use prn for loose stools.   Tramadol would be okay to take if needed for pain, esp at night.   I'm okay with patient using tramadol at night.  I would try to continue the other meds, unless he couldn't swallow the pills.   Thanks.

## 2017-01-28 DIAGNOSIS — R1310 Dysphagia, unspecified: Secondary | ICD-10-CM | POA: Diagnosis not present

## 2017-01-28 DIAGNOSIS — N401 Enlarged prostate with lower urinary tract symptoms: Secondary | ICD-10-CM | POA: Diagnosis not present

## 2017-01-28 DIAGNOSIS — C7800 Secondary malignant neoplasm of unspecified lung: Secondary | ICD-10-CM | POA: Diagnosis not present

## 2017-01-28 DIAGNOSIS — C649 Malignant neoplasm of unspecified kidney, except renal pelvis: Secondary | ICD-10-CM | POA: Diagnosis not present

## 2017-01-28 DIAGNOSIS — R634 Abnormal weight loss: Secondary | ICD-10-CM | POA: Diagnosis not present

## 2017-01-28 DIAGNOSIS — J910 Malignant pleural effusion: Secondary | ICD-10-CM | POA: Diagnosis not present

## 2017-01-28 NOTE — Telephone Encounter (Signed)
Robin notified of Dr. Josefine Class comments/ instructions and verbalized understanding

## 2017-01-29 ENCOUNTER — Telehealth: Payer: Self-pay

## 2017-01-29 DIAGNOSIS — R634 Abnormal weight loss: Secondary | ICD-10-CM | POA: Diagnosis not present

## 2017-01-29 DIAGNOSIS — C649 Malignant neoplasm of unspecified kidney, except renal pelvis: Secondary | ICD-10-CM | POA: Diagnosis not present

## 2017-01-29 DIAGNOSIS — R1310 Dysphagia, unspecified: Secondary | ICD-10-CM | POA: Diagnosis not present

## 2017-01-29 DIAGNOSIS — N401 Enlarged prostate with lower urinary tract symptoms: Secondary | ICD-10-CM | POA: Diagnosis not present

## 2017-01-29 DIAGNOSIS — C7800 Secondary malignant neoplasm of unspecified lung: Secondary | ICD-10-CM | POA: Diagnosis not present

## 2017-01-29 DIAGNOSIS — J910 Malignant pleural effusion: Secondary | ICD-10-CM | POA: Diagnosis not present

## 2017-01-29 MED ORDER — MORPHINE SULFATE 20 MG/5ML PO SOLN: ORAL | 0 refills | Status: AC

## 2017-01-29 MED ORDER — LORAZEPAM 0.5 MG PO TABS: ORAL_TABLET | ORAL | Status: AC

## 2017-01-29 NOTE — Telephone Encounter (Signed)
Robin with Hospice said she is at the home and thinks pt is close to actively dying. Pt sleeps most of the time but when is awake pt is confused. Pt not eat in 2 days. Pt cannot swallow his pills. Pt having back pain and Robin request liquid morphine to [place].  Robin request cb.

## 2017-01-29 NOTE — Telephone Encounter (Signed)
Shirlean Mylar called back and pt has liquid morphine at home from 01/11/17; robin wants to verify if should follow those instructions that were given for SOB now that pt is in pain. Robin request cb.

## 2017-01-29 NOTE — Telephone Encounter (Signed)
Stop all orders for PO pills.  Can give lorazepam 0.'5mg'$  1-2 tabs SL if needed every 6 hours for agitation.  Can use morphine 2.5-'5mg'$  q2 hours for SOB or pain.  I thank all involved.   Med list updated.

## 2017-01-29 NOTE — Telephone Encounter (Signed)
Spoke to [place]. She said thank you!

## 2017-01-30 DIAGNOSIS — J910 Malignant pleural effusion: Secondary | ICD-10-CM | POA: Diagnosis not present

## 2017-01-30 DIAGNOSIS — C7800 Secondary malignant neoplasm of unspecified lung: Secondary | ICD-10-CM | POA: Diagnosis not present

## 2017-01-30 DIAGNOSIS — R634 Abnormal weight loss: Secondary | ICD-10-CM | POA: Diagnosis not present

## 2017-01-30 DIAGNOSIS — R1310 Dysphagia, unspecified: Secondary | ICD-10-CM | POA: Diagnosis not present

## 2017-01-30 DIAGNOSIS — N401 Enlarged prostate with lower urinary tract symptoms: Secondary | ICD-10-CM | POA: Diagnosis not present

## 2017-01-30 DIAGNOSIS — C649 Malignant neoplasm of unspecified kidney, except renal pelvis: Secondary | ICD-10-CM | POA: Diagnosis not present

## 2017-01-31 DIAGNOSIS — J910 Malignant pleural effusion: Secondary | ICD-10-CM | POA: Diagnosis not present

## 2017-01-31 DIAGNOSIS — N401 Enlarged prostate with lower urinary tract symptoms: Secondary | ICD-10-CM | POA: Diagnosis not present

## 2017-01-31 DIAGNOSIS — R1310 Dysphagia, unspecified: Secondary | ICD-10-CM | POA: Diagnosis not present

## 2017-01-31 DIAGNOSIS — R634 Abnormal weight loss: Secondary | ICD-10-CM | POA: Diagnosis not present

## 2017-01-31 DIAGNOSIS — C649 Malignant neoplasm of unspecified kidney, except renal pelvis: Secondary | ICD-10-CM | POA: Diagnosis not present

## 2017-01-31 DIAGNOSIS — C7800 Secondary malignant neoplasm of unspecified lung: Secondary | ICD-10-CM | POA: Diagnosis not present

## 2017-02-01 ENCOUNTER — Telehealth: Payer: Self-pay | Admitting: Family Medicine

## 2017-02-01 NOTE — Telephone Encounter (Signed)
Hospice called to let Dr.Duncan know patient passed away on 02/04/17 at 9:22pm at home.

## 2017-02-01 NOTE — Telephone Encounter (Signed)
Called his family and extended my condolences.  Family thanked me for the call.  I was always glad to see this kind gentleman in clinic.  He is (and will be) missed.

## 2017-02-03 ENCOUNTER — Ambulatory Visit: Payer: Medicare Other | Admitting: Oncology

## 2017-02-18 DEATH — deceased

## 2018-09-26 IMAGING — DX DG CHEST 2V
2 series · 2 of 2 positions shown · non-contrast
Comparison: Portable chest x-ray May 03, 2016.

CLINICAL DATA: One week of cough. No fever. Dyspnea on exertion.
History of colonic malignancy metastatic to the lung.

EXAM:
CHEST  2 VIEW

[chest pa]
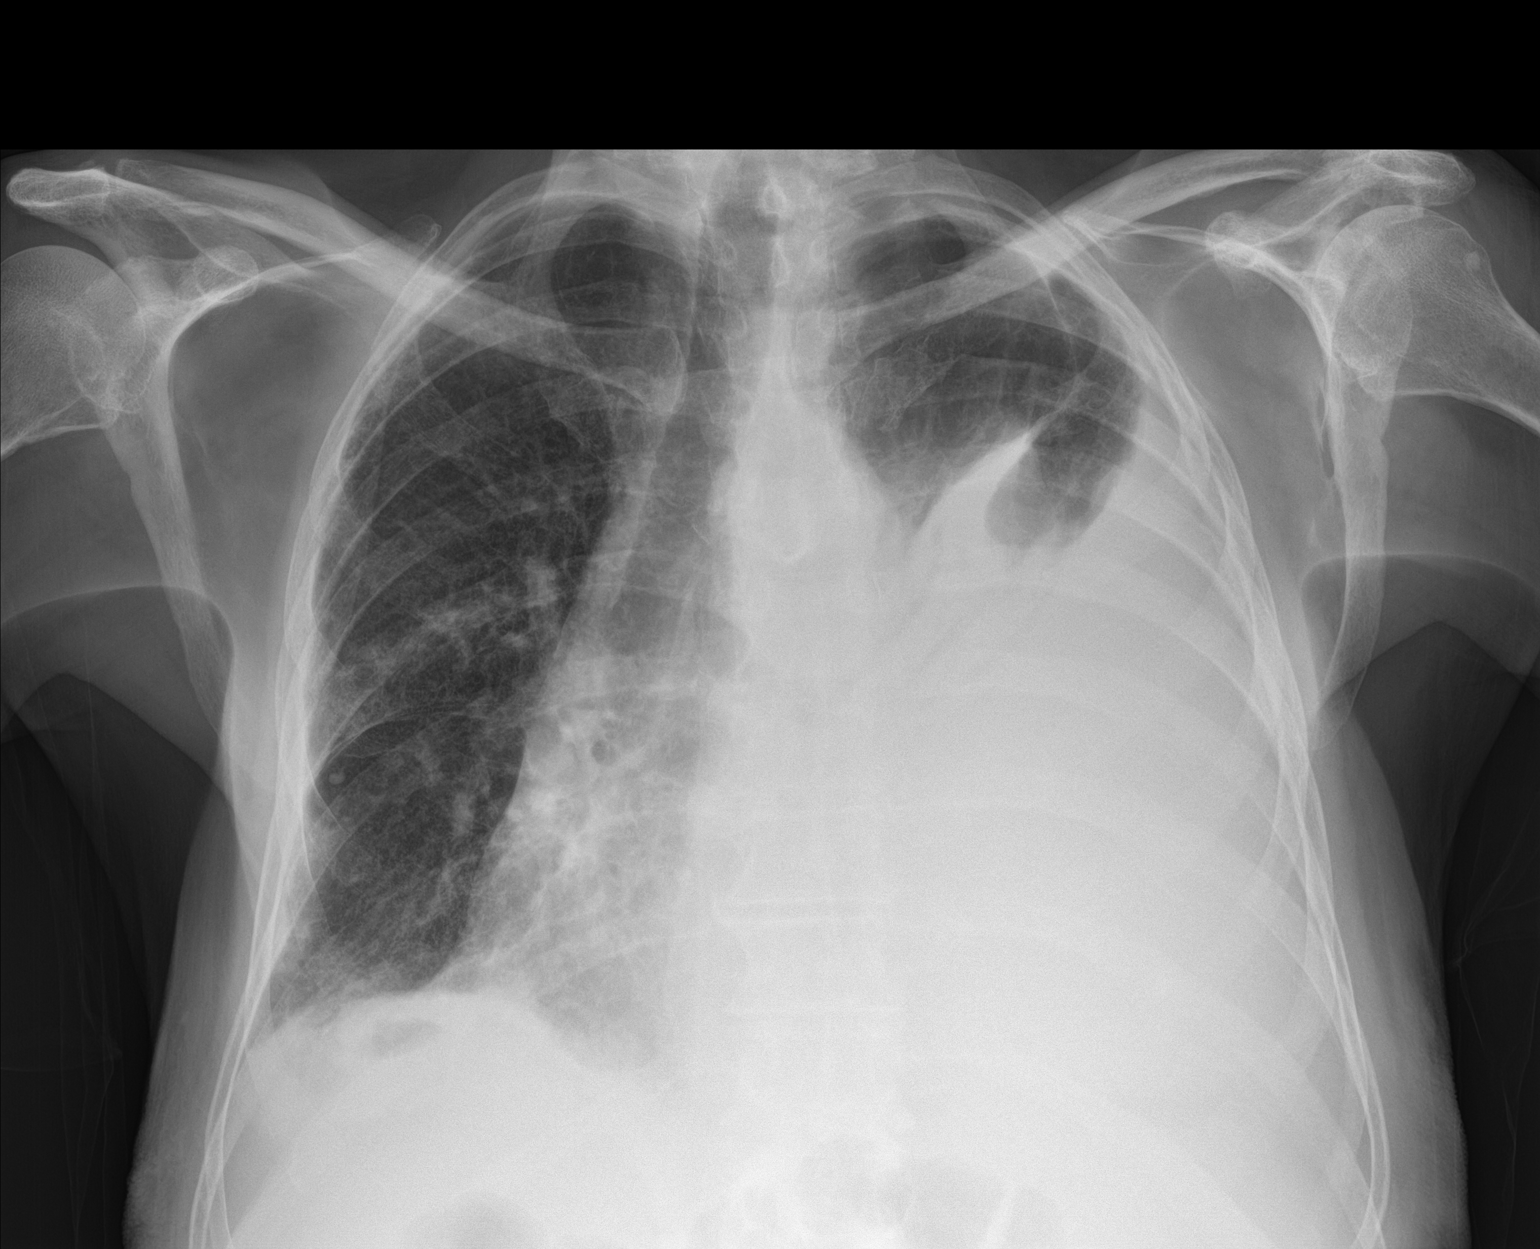

[chest lat]
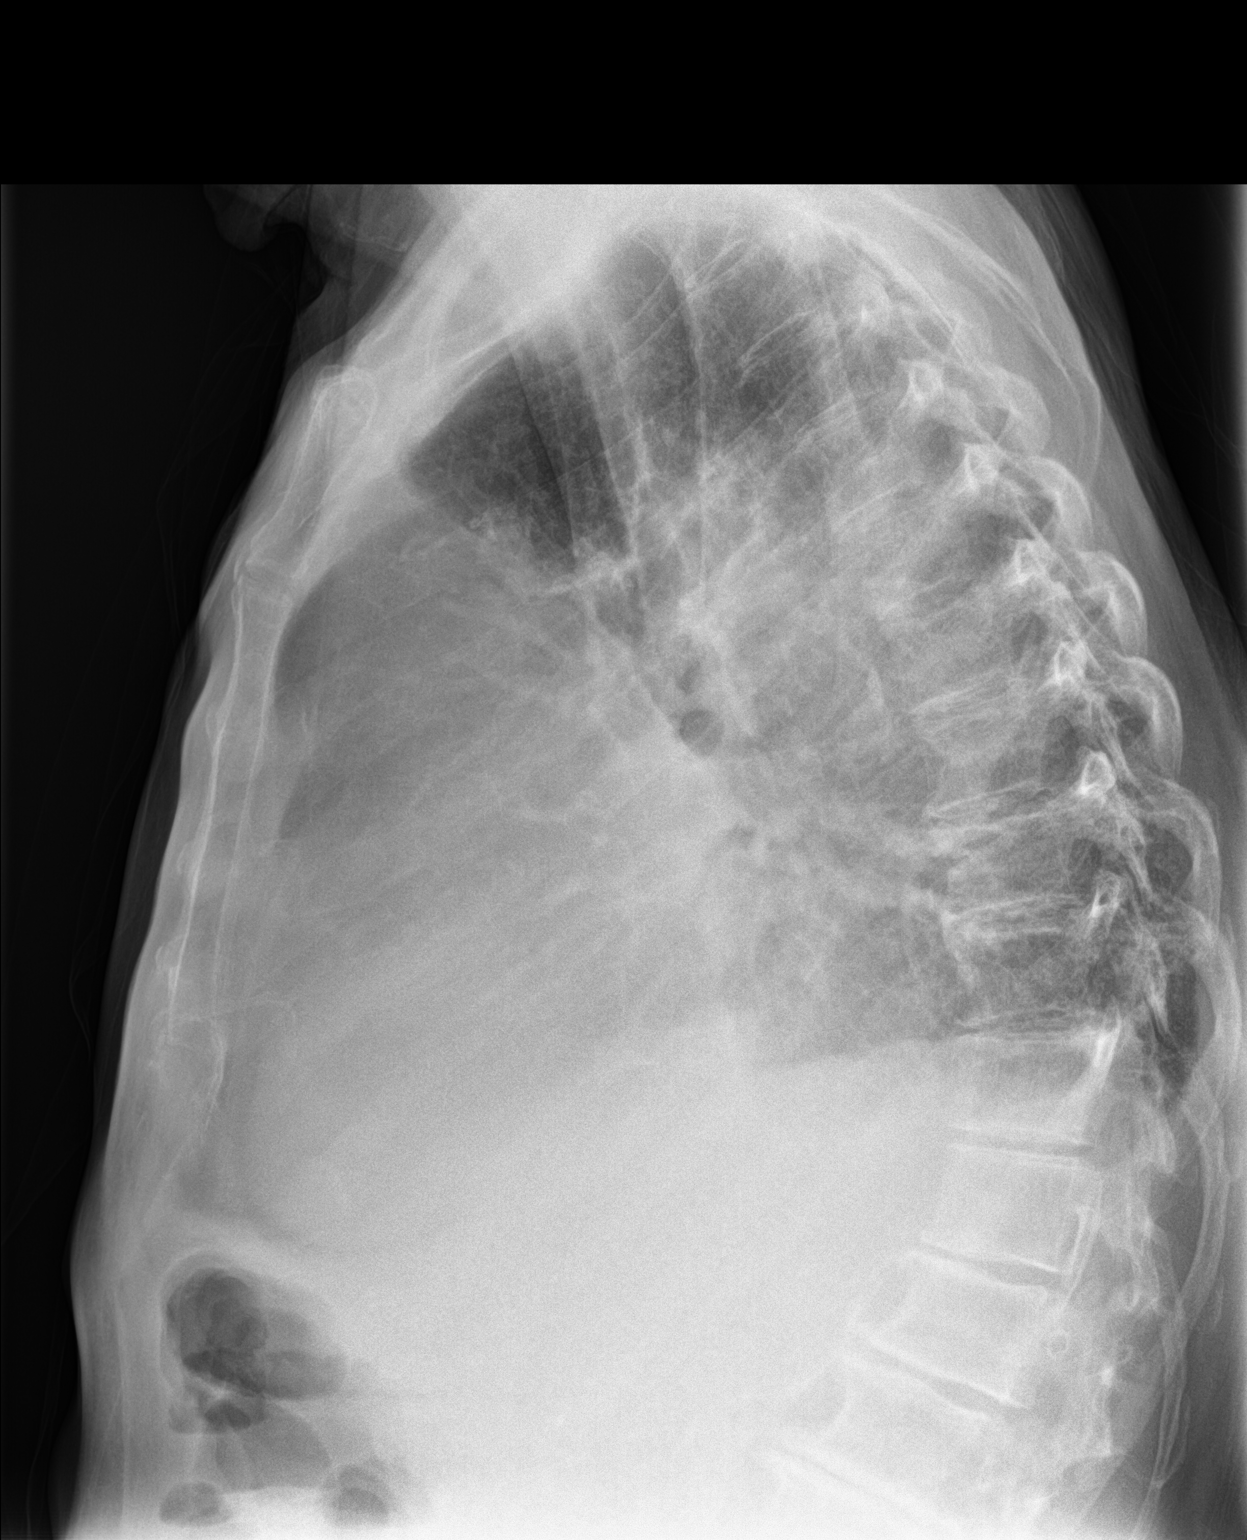

[2 of 2 positions shown; findings below may reference images not displayed]

FINDINGS: There has been interval development of a large left pleural
effusion. There is mild shift of the mediastinum toward the right.
The interstitial markings of the right lung are mildly prominent but
less conspicuous than on the previous study. The left heart border
is obscured. There is calcification in the wall of the aortic arch.
IMPRESSION: Interval development of a large left-sided pleural effusion with
shift of the mediastinum toward the right.

These results will be called to the ordering clinician or
representative by the Radiologist Assistant, and communication
documented in the PACS or zVision Dashboard.

## 2018-10-21 IMAGING — CT CT CHEST W/ CM
2 of 13 series · 11 of 46 positions shown, 15 images · IV contrast (iopamidol)
Comparison: 08/11/2016

CLINICAL DATA: Follow-up metastatic renal cancer with lung
metastases, history of colon resection, chemotherapy ongoing. Status
post left thoracentesis on 10/31/2016. Shortness of breath.

EXAM:
CT CHEST WITH CONTRAST
CT ABDOMEN AND PELVIS WITH AND WITHOUT CONTRAST
TECHNIQUE: Multidetector CT imaging of the chest was performed during
intravenous contrast administration. Multidetector CT imaging of the
abdomen and pelvis was performed following the standard protocol
before and during bolus administration of intravenous contrast.
CONTRAST:  100mL FRROUJ-H44 IOPAMIDOL (FRROUJ-H44) INJECTION 61%

[Series 3: coronal pre · coronal · non-contrast · 0.47mm/px · 2 of 86 slices shown]
[im 29/86  soft-tissue]
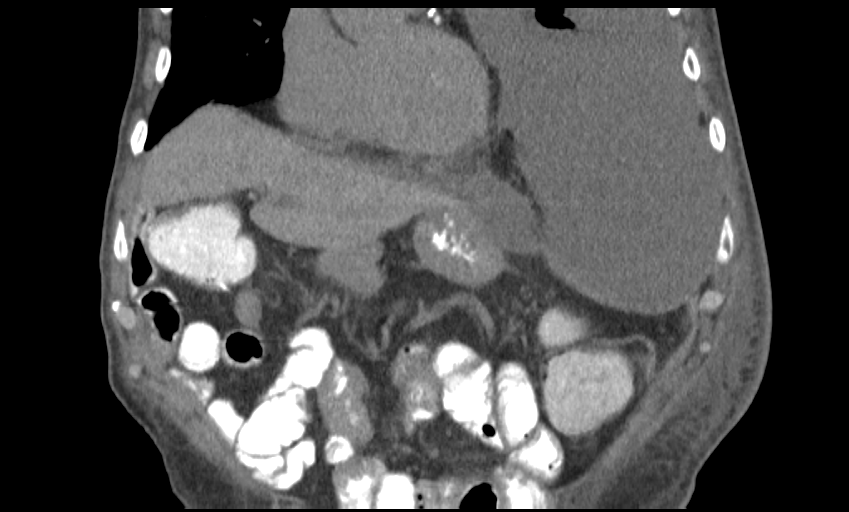
[im 57/86  soft-tissue]
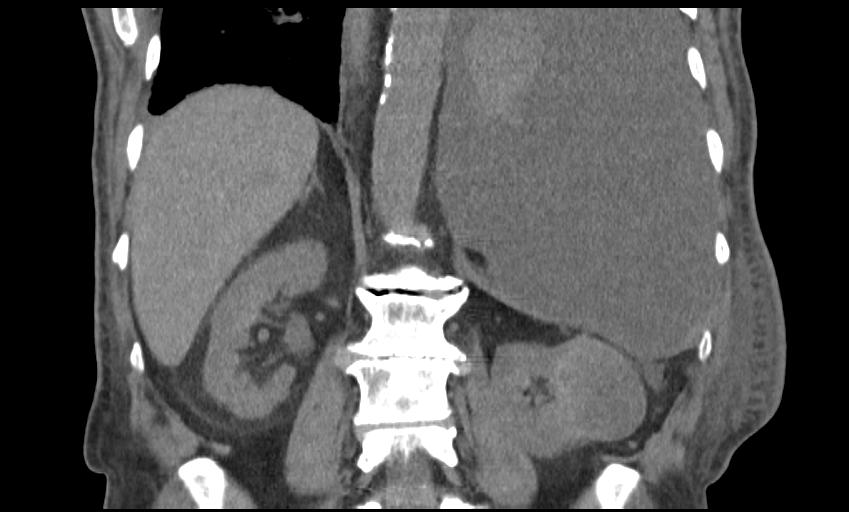

[Series 11: axial nephro · axial · 0.78mm/px · z∈[-17,+469]mm · 9 of 200 slices shown, 13 images]
[im 19/200  soft-tissue]
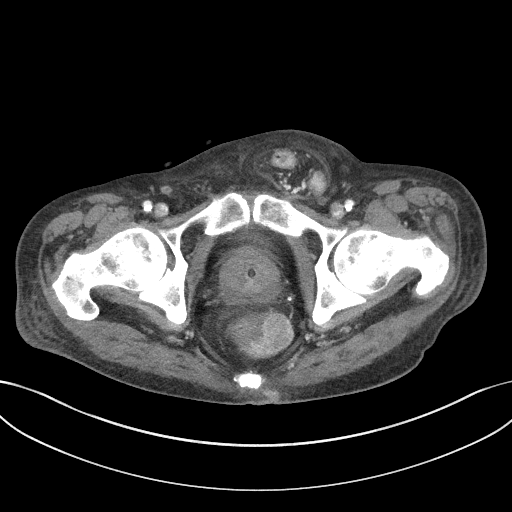
[im 19/200  bone]
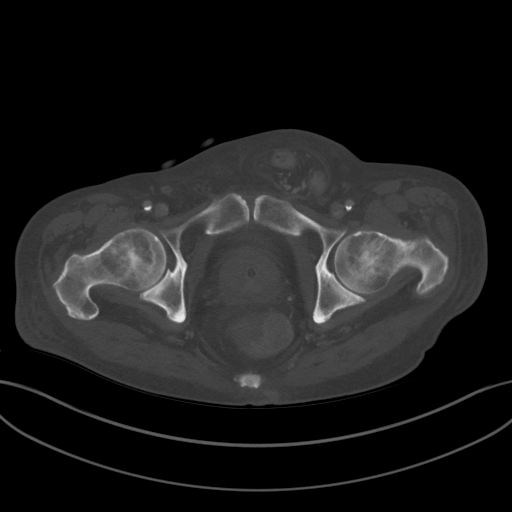
[im 37/200  soft-tissue]
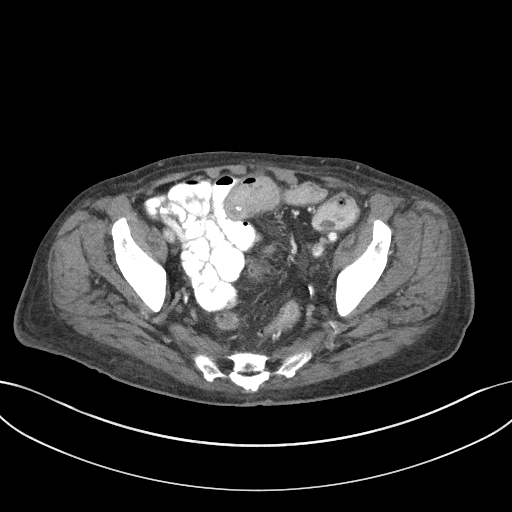
[im 73/200  soft-tissue]
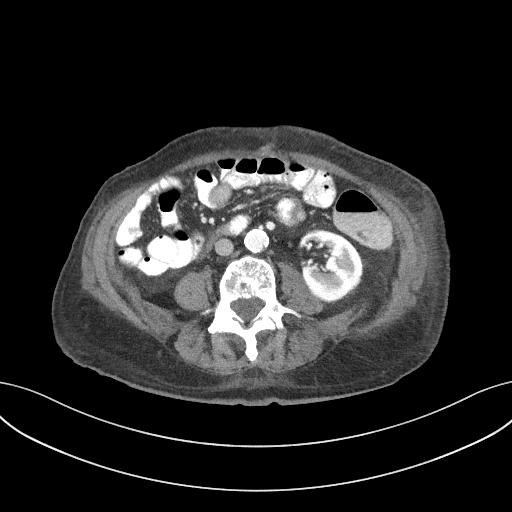
[im 91/200  soft-tissue]
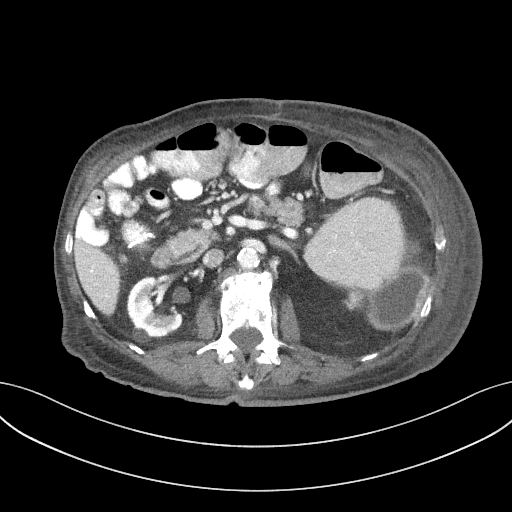
[im 109/200  soft-tissue]
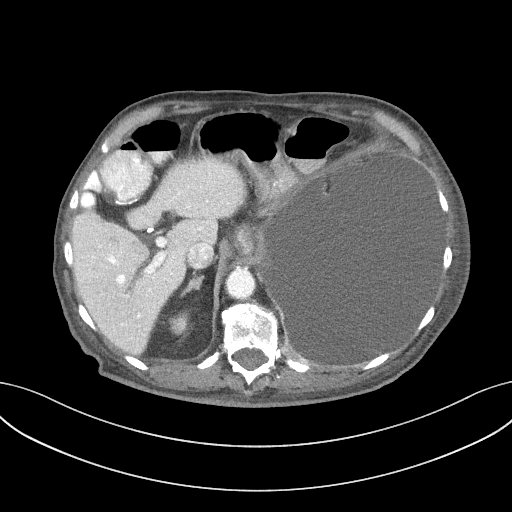
[im 127/200  soft-tissue]
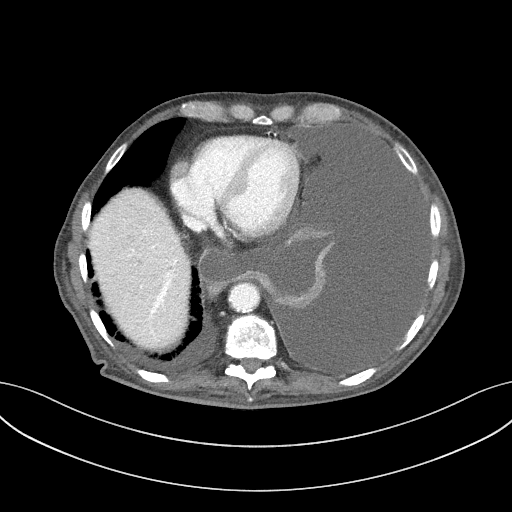
[im 127/200  lung]
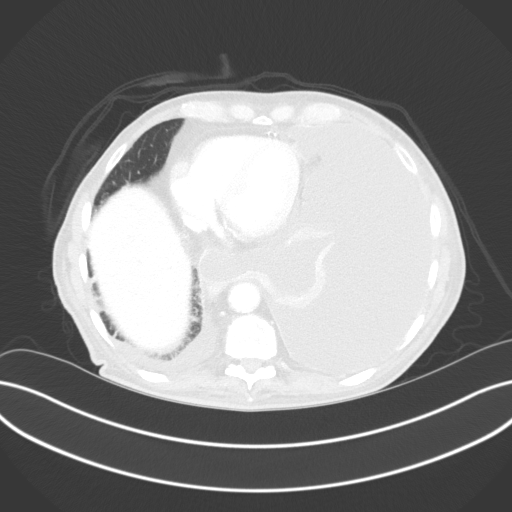
[im 145/200  lung]
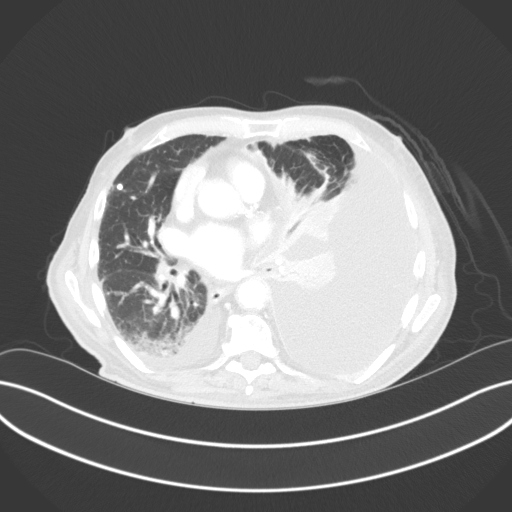
[im 163/200  soft-tissue]
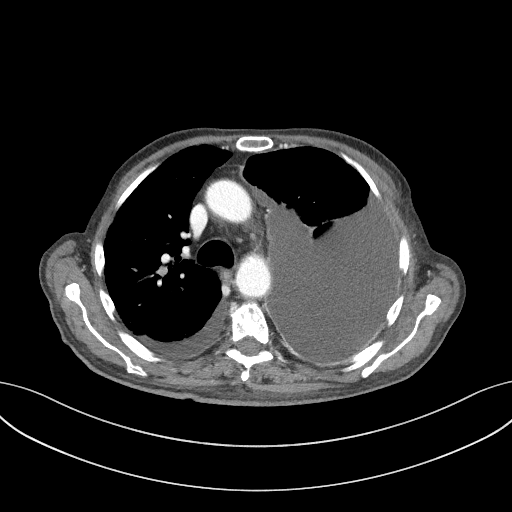
[im 163/200  lung]
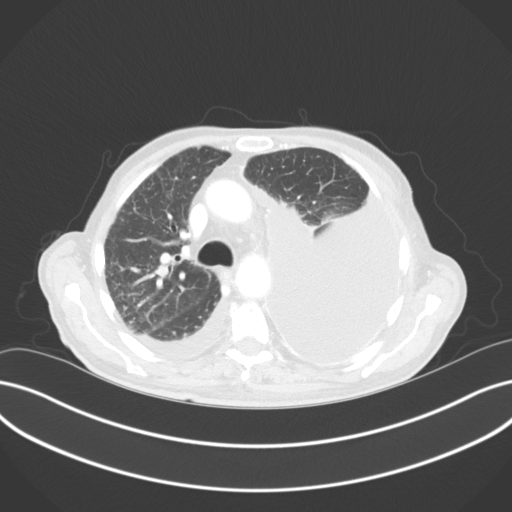
[im 181/200  soft-tissue]
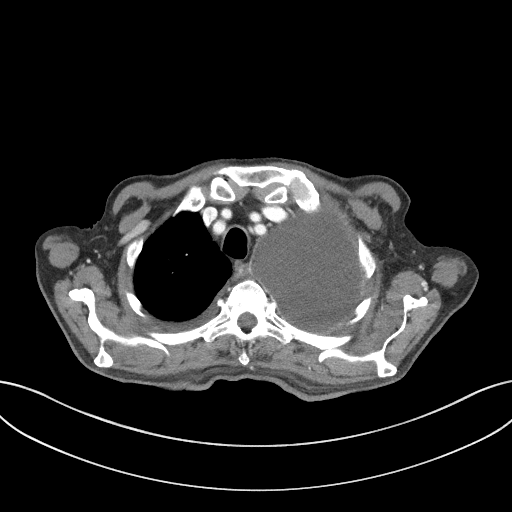
[im 181/200  lung]
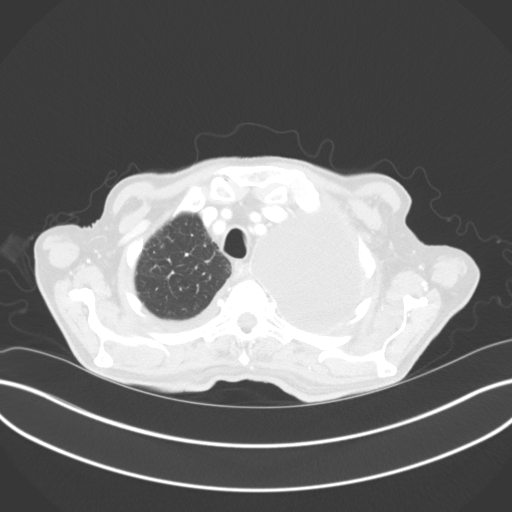

[11 of 46 positions shown; findings below may reference images not displayed]

FINDINGS: CT CHEST FINDINGS

Cardiovascular: The heart is normal in size. No pericardial
effusion.

Three vessel coronary atherosclerosis.

Atherosclerotic calcifications of the aortic arch.

Mediastinum/Nodes: 11 mm short axis subcarinal node (series 7/ image
47), unchanged.

2.2 x 3.0 cm left pericardial mass (series 11/image 84), previously
2.4 x 3.2 cm, stable versus mildly decreased.

Visualized thyroid is unremarkable.

Lungs/Pleura: Large left pleural effusion with pleural thickening,
malignant.

Associated compressive atelectasis of the left upper and lower
lobes. Underlying 2.9 x 4.3 cm left lower lobe mass (series 11/
image 65), previously 2.6 x 4.6 cm, grossly unchanged.

Small right pleural effusion. Mild dependent atelectasis in the
right lower lobe.

Calcified granuloma in the right middle lobe (series 15/ image 34).
No suspicious pulmonary nodules.

No pneumothorax.

Musculoskeletal: Degenerative changes of the thoracic spine.

Metastasis with pathologic fracture involving the acromion of the
left scapula (series 11/ image 7).

Metastasis pathologic fracture involving the left posterior 8th rib
(series 11/image 52), new. Metastasis with pathologic fracture
involving the left posterior 12th rib (series 11/image 101), new.

CT ABDOMEN AND PELVIS FINDINGS

Hepatobiliary: Liver is notable for mild intrahepatic ductal
dilatation.

Status post cholecystectomy. No intrahepatic or extrahepatic ductal
dilatation.

Pancreas: 5 mm hypoenhancing lesion in the pancreatic head/ uncinate
process (series 11/ image 114), likely reflecting a benign
pancreatic cyst versus invagination of benign extra pancreatic fat.

Spleen: Within normal limits.

Adrenals/Urinary Tract: Adrenal glands are within normal limits.

Dominant lateral upper pole renal mass measures 5.7 x 4.4 x 5.2 cm
(series 11/ image 119), previously 5.9 x 4.3 x 5.6 cm, grossly
unchanged. Adjacent 2.0 cm enhancing mural nodule along the medial
aspect of the lesion and typically in the upper pole of the kidney
(series 11/ image 122), previously 1.7 cm.

Right kidney is within normal limits.  No hydronephrosis.

Mildly thick-walled bladder with indwelling Foley catheter.

Stomach/Bowel: Stomach is within normal limits.

No evidence of bowel obstruction.

Status post right hemicolectomy with appendectomy.

Moderate left colonic stool burden.

Vascular/Lymphatic: No evidence of abdominal aortic aneurysm.

Atherosclerotic calcifications of the abdominal aorta and branch
vessels.

No suspicious abdominopelvic lymphadenopathy.

Reproductive: Prostatomegaly with dystrophic calcifications.

Other: Large left inguinal/ scrotal hernia containing fat and
nondilated sigmoid colon (series 11/ image 200), unchanged.

Trace pelvic ascites (series 11/ image 173).

Musculoskeletal: Degenerative changes of the thoracic spine.

Sclerotic lesion/bone island in the right posterior iliac bone
(series 11/ image 145).
IMPRESSION: 5.7 cm left upper pole renal cell carcinoma with associated 2.0 cm
enhancing mural nodule, grossly unchanged.

3.0 cm left pericardial mass and 4.3 cm left lower lobe mass,
grossly unchanged.

Large left pleural effusion with associated pleural thickening,
malignant, grossly unchanged.

11 mm short axis subcarinal node, suspicious for metastasis, grossly
unchanged.

Osseous metastasis with pathologic fracture involving the left
acromion, unchanged. New osseous metastases with pathologic fracture
involving the left posterior 8th and 12th ribs.

Additional ancillary findings as above.
